# Patient Record
Sex: Female | Born: 1945 | Race: Black or African American | Hispanic: No | Marital: Married | Smoking: Never smoker
Health system: Southern US, Community
[De-identification: ages and names within clinical notes are randomized; demographics above are authoritative.]

## PROBLEM LIST (undated history)

## (undated) DIAGNOSIS — M810 Age-related osteoporosis without current pathological fracture: Secondary | ICD-10-CM

## (undated) DIAGNOSIS — G4733 Obstructive sleep apnea (adult) (pediatric): Secondary | ICD-10-CM

## (undated) DIAGNOSIS — T7840XA Allergy, unspecified, initial encounter: Secondary | ICD-10-CM

## (undated) DIAGNOSIS — I10 Essential (primary) hypertension: Secondary | ICD-10-CM

## (undated) DIAGNOSIS — H269 Unspecified cataract: Secondary | ICD-10-CM

## (undated) DIAGNOSIS — M1990 Unspecified osteoarthritis, unspecified site: Secondary | ICD-10-CM

## (undated) DIAGNOSIS — E119 Type 2 diabetes mellitus without complications: Secondary | ICD-10-CM

## (undated) DIAGNOSIS — Z972 Presence of dental prosthetic device (complete) (partial): Secondary | ICD-10-CM

## (undated) DIAGNOSIS — E785 Hyperlipidemia, unspecified: Secondary | ICD-10-CM

## (undated) DIAGNOSIS — G4730 Sleep apnea, unspecified: Secondary | ICD-10-CM

## (undated) DIAGNOSIS — T753XXA Motion sickness, initial encounter: Secondary | ICD-10-CM

## (undated) HISTORY — DX: Type 2 diabetes mellitus without complications: E11.9

## (undated) HISTORY — DX: Unspecified cataract: H26.9

## (undated) HISTORY — DX: Hyperlipidemia, unspecified: E78.5

## (undated) HISTORY — DX: Essential (primary) hypertension: I10

## (undated) HISTORY — DX: Unspecified osteoarthritis, unspecified site: M19.90

## (undated) HISTORY — DX: Sleep apnea, unspecified: G47.30

## (undated) HISTORY — DX: Obstructive sleep apnea (adult) (pediatric): G47.33

## (undated) HISTORY — PX: ABDOMINAL HYSTERECTOMY: SHX81

## (undated) HISTORY — DX: Age-related osteoporosis without current pathological fracture: M81.0

## (undated) HISTORY — DX: Allergy, unspecified, initial encounter: T78.40XA

---

## 2004-03-21 ENCOUNTER — Ambulatory Visit: Payer: Self-pay | Admitting: *Deleted

## 2004-05-24 ENCOUNTER — Ambulatory Visit: Payer: Self-pay | Admitting: *Deleted

## 2004-06-02 ENCOUNTER — Ambulatory Visit: Payer: Self-pay | Admitting: Family Medicine

## 2004-11-24 ENCOUNTER — Ambulatory Visit: Payer: Self-pay | Admitting: Gastroenterology

## 2005-03-20 ENCOUNTER — Ambulatory Visit: Payer: Self-pay | Admitting: Gastroenterology

## 2005-03-20 ENCOUNTER — Ambulatory Visit: Payer: Self-pay | Admitting: *Deleted

## 2005-10-19 ENCOUNTER — Ambulatory Visit: Payer: Self-pay | Admitting: Gastroenterology

## 2006-04-26 ENCOUNTER — Ambulatory Visit: Payer: Self-pay | Admitting: Family Medicine

## 2006-10-31 ENCOUNTER — Ambulatory Visit: Payer: Self-pay | Admitting: Gastroenterology

## 2006-12-24 ENCOUNTER — Ambulatory Visit: Payer: Self-pay | Admitting: Family Medicine

## 2007-04-24 ENCOUNTER — Emergency Department: Payer: Self-pay | Admitting: Emergency Medicine

## 2007-10-22 ENCOUNTER — Emergency Department: Payer: Self-pay | Admitting: Emergency Medicine

## 2007-10-22 ENCOUNTER — Other Ambulatory Visit: Payer: Self-pay

## 2008-02-07 ENCOUNTER — Ambulatory Visit: Payer: Self-pay | Admitting: Gastroenterology

## 2008-02-10 ENCOUNTER — Ambulatory Visit: Payer: Self-pay | Admitting: Family Medicine

## 2010-03-26 ENCOUNTER — Other Ambulatory Visit: Payer: Self-pay | Admitting: *Deleted

## 2010-06-30 ENCOUNTER — Ambulatory Visit: Payer: Self-pay | Admitting: Family Medicine

## 2010-09-13 ENCOUNTER — Ambulatory Visit: Payer: Self-pay | Admitting: Orthopaedic Surgery

## 2010-10-10 DIAGNOSIS — I10 Essential (primary) hypertension: Secondary | ICD-10-CM | POA: Insufficient documentation

## 2010-10-10 DIAGNOSIS — M545 Low back pain, unspecified: Secondary | ICD-10-CM | POA: Insufficient documentation

## 2010-10-10 DIAGNOSIS — E1165 Type 2 diabetes mellitus with hyperglycemia: Secondary | ICD-10-CM | POA: Insufficient documentation

## 2010-10-10 DIAGNOSIS — G8929 Other chronic pain: Secondary | ICD-10-CM | POA: Insufficient documentation

## 2010-10-10 DIAGNOSIS — E1140 Type 2 diabetes mellitus with diabetic neuropathy, unspecified: Secondary | ICD-10-CM | POA: Insufficient documentation

## 2010-10-10 DIAGNOSIS — M810 Age-related osteoporosis without current pathological fracture: Secondary | ICD-10-CM | POA: Insufficient documentation

## 2010-10-10 DIAGNOSIS — R490 Dysphonia: Secondary | ICD-10-CM | POA: Insufficient documentation

## 2011-01-17 ENCOUNTER — Ambulatory Visit: Payer: Self-pay | Admitting: Family Medicine

## 2011-02-02 ENCOUNTER — Ambulatory Visit: Payer: Self-pay | Admitting: Family Medicine

## 2011-03-07 ENCOUNTER — Ambulatory Visit: Payer: Self-pay | Admitting: Family Medicine

## 2011-03-08 ENCOUNTER — Ambulatory Visit: Payer: Self-pay | Admitting: Gastroenterology

## 2011-04-04 ENCOUNTER — Ambulatory Visit: Payer: Self-pay | Admitting: Family Medicine

## 2011-08-09 DIAGNOSIS — G5710 Meralgia paresthetica, unspecified lower limb: Secondary | ICD-10-CM | POA: Insufficient documentation

## 2011-11-07 ENCOUNTER — Ambulatory Visit: Payer: Self-pay | Admitting: Family Medicine

## 2012-03-19 DIAGNOSIS — E7800 Pure hypercholesterolemia, unspecified: Secondary | ICD-10-CM | POA: Insufficient documentation

## 2012-06-26 ENCOUNTER — Ambulatory Visit: Payer: Self-pay | Admitting: Family Medicine

## 2012-10-04 ENCOUNTER — Ambulatory Visit: Payer: Self-pay | Admitting: Emergency Medicine

## 2012-10-04 LAB — CBC WITH DIFFERENTIAL/PLATELET
Basophil #: 0 10*3/uL (ref 0.0–0.1)
Basophil %: 0.4 %
Eosinophil #: 0.2 10*3/uL (ref 0.0–0.7)
Eosinophil %: 2.6 %
HCT: 45 % (ref 35.0–47.0)
HGB: 14.9 g/dL (ref 12.0–16.0)
Lymphocyte #: 0.5 10*3/uL — ABNORMAL LOW (ref 1.0–3.6)
Lymphocyte %: 5.7 %
MCH: 30.7 pg (ref 26.0–34.0)
MCHC: 33.1 g/dL (ref 32.0–36.0)
MCV: 93 fL (ref 80–100)
Monocyte #: 0.3 x10 3/mm (ref 0.2–0.9)
Monocyte %: 3.8 %
Neutrophil #: 7.1 10*3/uL — ABNORMAL HIGH (ref 1.4–6.5)
Neutrophil %: 87.5 %
Platelet: 239 10*3/uL (ref 150–440)
RBC: 4.85 10*6/uL (ref 3.80–5.20)
RDW: 13.6 % (ref 11.5–14.5)
WBC: 8.2 10*3/uL (ref 3.6–11.0)

## 2012-10-04 LAB — COMPREHENSIVE METABOLIC PANEL
Albumin: 4.1 g/dL (ref 3.4–5.0)
Alkaline Phosphatase: 82 U/L (ref 50–136)
Anion Gap: 9 (ref 7–16)
BUN: 23 mg/dL — ABNORMAL HIGH (ref 7–18)
Bilirubin,Total: 0.7 mg/dL (ref 0.2–1.0)
Calcium, Total: 10.1 mg/dL (ref 8.5–10.1)
Chloride: 103 mmol/L (ref 98–107)
Co2: 29 mmol/L (ref 21–32)
Creatinine: 0.96 mg/dL (ref 0.60–1.30)
EGFR (African American): >60
EGFR (Non-African Amer.): >60
Glucose: 177 mg/dL — ABNORMAL HIGH (ref 65–99)
Osmolality: 289 (ref 275–301)
Potassium: 4.1 mmol/L (ref 3.5–5.1)
SGOT(AST): 22 U/L (ref 15–37)
SGPT (ALT): 32 U/L (ref 12–78)
Sodium: 141 mmol/L (ref 136–145)
Total Protein: 8.5 g/dL — ABNORMAL HIGH (ref 6.4–8.2)

## 2012-10-04 LAB — HEMOGLOBIN A1C: Hemoglobin A1C: 8.9 % — ABNORMAL HIGH (ref 4.2–6.3)

## 2012-11-22 ENCOUNTER — Ambulatory Visit: Payer: Self-pay | Admitting: Family Medicine

## 2012-11-22 LAB — CREATININE, SERUM: EGFR (African American): >60

## 2012-12-11 ENCOUNTER — Ambulatory Visit: Payer: Self-pay | Admitting: Family Medicine

## 2013-11-01 LAB — HM COLONOSCOPY

## 2014-03-03 ENCOUNTER — Ambulatory Visit: Payer: Self-pay | Admitting: Family Medicine

## 2014-04-16 LAB — HM HEPATITIS C SCREENING LAB: HM HEPATITIS C SCREENING: NEGATIVE

## 2014-09-16 ENCOUNTER — Encounter: Payer: Self-pay | Admitting: Family Medicine

## 2014-09-16 ENCOUNTER — Encounter (INDEPENDENT_AMBULATORY_CARE_PROVIDER_SITE_OTHER): Payer: Self-pay

## 2014-09-16 ENCOUNTER — Other Ambulatory Visit: Payer: Self-pay

## 2014-09-16 ENCOUNTER — Ambulatory Visit (INDEPENDENT_AMBULATORY_CARE_PROVIDER_SITE_OTHER): Payer: Commercial Managed Care - HMO | Admitting: Family Medicine

## 2014-09-16 VITALS — BP 130/74 | HR 79 | Ht 62.0 in | Wt 141.2 lb

## 2014-09-16 DIAGNOSIS — M5136 Other intervertebral disc degeneration, lumbar region: Secondary | ICD-10-CM | POA: Diagnosis not present

## 2014-09-16 DIAGNOSIS — IMO0002 Reserved for concepts with insufficient information to code with codable children: Secondary | ICD-10-CM

## 2014-09-16 DIAGNOSIS — I10 Essential (primary) hypertension: Secondary | ICD-10-CM | POA: Diagnosis not present

## 2014-09-16 DIAGNOSIS — E7800 Pure hypercholesterolemia, unspecified: Secondary | ICD-10-CM

## 2014-09-16 DIAGNOSIS — H6123 Impacted cerumen, bilateral: Secondary | ICD-10-CM | POA: Diagnosis not present

## 2014-09-16 DIAGNOSIS — E1165 Type 2 diabetes mellitus with hyperglycemia: Secondary | ICD-10-CM | POA: Diagnosis not present

## 2014-09-16 DIAGNOSIS — M810 Age-related osteoporosis without current pathological fracture: Secondary | ICD-10-CM | POA: Diagnosis not present

## 2014-09-16 DIAGNOSIS — B351 Tinea unguium: Secondary | ICD-10-CM

## 2014-09-16 DIAGNOSIS — E559 Vitamin D deficiency, unspecified: Secondary | ICD-10-CM

## 2014-09-16 DIAGNOSIS — R2681 Unsteadiness on feet: Secondary | ICD-10-CM

## 2014-09-17 ENCOUNTER — Other Ambulatory Visit: Payer: Commercial Managed Care - HMO

## 2014-09-17 ENCOUNTER — Other Ambulatory Visit: Payer: Self-pay | Admitting: Family Medicine

## 2014-09-17 NOTE — Progress Notes (Signed)
Date:  09/16/2014   Name:  Melinda Ford   DOB:  [birth year]   MRN:  [id number]  PCP:  Adline Potter, MD    Chief Complaint: Establish Care   History of Present Illness:  This is a 69 y.o. female with T2DM, last a1c 8-9% over 3 months ago. Checks BG's qd -bid, on Lantus x 6 months but does give if BG<150. Followed by Dr. Gloriann Loan at [facility], has cataracts but no retinopathy. No nephropathy or neuropathy noted. Hx DDD with intermittent LBP uses Tylenol/Advil prn only. Has toenails trimmed by podiatry due to onychomycosis. HX HTN well controlled. Hx osteoporosis on Fosamax weekly over 5 years, last DEXA 2-3 yrs ago, told had vit D def at one point but not on current supplement. C/o B decreased hearing and hx cerumen impactions. Stopped statin on own a while back. Tetanus status unknown but wants [place] to check records from [facility] first, pneumococcal imm within pasat 2 years (unsure which one), zoster within past year, mammogram yearly last 11/15 ok, colonoscopy x 1 in past ok. Has had 2 trip falls past year but does feel balance is off and having more memory problems. Not happy about having to switch PCP as [facility] no longer in [place], eager to switch back in future if possible.  Review of Systems:  Review of Systems  Constitutional: Negative for activity change, appetite change and unexpected weight change.  HENT: Negative for ear discharge, ear pain and trouble swallowing.   Eyes: Negative for pain.  Respiratory: Negative for shortness of breath.   Cardiovascular: Negative for chest pain and leg swelling.  Gastrointestinal: Negative for abdominal pain, diarrhea, constipation and abdominal distention.  Endocrine: Negative for polyuria.  Genitourinary: Negative for hematuria and difficulty urinating.  Musculoskeletal: Positive for back pain and gait problem. Negative for joint swelling.  Skin: Negative for rash.  Neurological: Negative for tremors, seizures and syncope.  Hematological:  Negative for adenopathy.  Psychiatric/Behavioral: Negative for sleep disturbance.    Patient Active Problem List   Diagnosis Date Noted  . Onychomycosis 09/16/2014  . Degenerative disc disease, lumbar 09/16/2014  . Hypercholesterolemia without hypertriglyceridemia 03/19/2012  . 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 Chronic hoarseness 10/10/2010  . Benign essential HTN 10/10/2010  . LBP (low back pain) 10/10/2010  . OP (osteoporosis) 10/10/2010  . Diabetes mellitus type 2, uncontrolled 10/10/2010    Prior to Admission medications   Medication Sig Start Date End Date Taking? Authorizing Provider  acetaminophen (TYLENOL) 500 MG tablet Take 500 mg by mouth every 6 (six) hours as needed.   Yes Historical Provider, MD  albuterol (PROAIR HFA) 108 (90 BASE) MCG/ACT inhaler Inhale 2 puffs into the lungs every 4 (four) hours as needed. 04/16/14 04/17/15 Yes Historical Provider, MD  amLODipine (NORVASC) 5 MG tablet Take 1 tablet by mouth daily. 04/16/14 04/17/15 Yes Historical Provider, MD  aspirin 325 MG tablet Take 325 mg by mouth 2 (two) times a week.   Yes Historical Provider, MD  Blood Glucose Monitoring Suppl (FIFTY50 GLUCOSE METER 2.0) W/DEVICE KIT 1 Units by Other route 3 (three) times daily as needed. 01/20/13  Yes Historical Provider, MD  glucose blood test strip 1 each by Other route 3 (three) times daily as needed. 07/27/14 05/05/15 Yes Historical Provider, MD  ibuprofen (ADVIL,MOTRIN) 200 MG tablet Take 200 mg by mouth every 6 (six) hours as needed.   Yes Historical Provider, MD  insulin glargine (LANTUS) 100 UNIT/ML injection Inject 9 Units into the skin at  bedtime. 05/20/14 05/20/15 Yes Historical Provider, MD  Insulin Syringe-Needle U-100 (INSULIN SYRINGE .3CC/31GX5/16") 31G X 5/16" 0.3 ML MISC Inject 1 Units as directed 3 (three) times daily as needed. 05/20/14  Yes Historical Provider, MD  losartan (COZAAR) 100 MG tablet Take 1 tablet by mouth daily. 04/16/14 04/17/15 Yes Historical Provider,  MD  metFORMIN (GLUCOPHAGE-XR) 500 MG 24 hr tablet Take 1 tablet by mouth 2 (two) times daily. 05/08/14 05/08/15 Yes Historical Provider, MD  ReliOn Ultra Thin Lancets MISC Inject 1 Units as directed 3 (three) times daily as needed. 05/06/14  Yes Historical Provider, MD  sitaGLIPtin (JANUVIA) 100 MG tablet Take 1 tablet by mouth daily. 05/04/14 05/04/15 Yes Historical Provider, MD  Multiple Vitamins tablet Take 1 tablet by mouth daily.    Historical Provider, MD    No Known Allergies  Past Surgical History  Procedure Laterality Date  . Abdominal hysterectomy      History  Substance Use Topics  . Smoking status: Never Smoker   . Smokeless tobacco: Never Used  . Alcohol Use: 0.6 oz/week    1 Standard drinks or equivalent per week    Family History  Problem Relation Age of Onset  . Alcohol abuse Father   . Mental illness Father   . Stroke Father   . Diabetes Sister   . Asthma Brother   . Diabetes Brother   . Diabetes Maternal Aunt   . Heart disease Maternal Aunt   . Diabetes Maternal Uncle   . Early death Maternal Uncle     Medication list has been reviewed and updated.  Physical Examination: BP 130/74 mmHg  Pulse 79  Ht 5' 2"  (1.575 m)  Wt 141 lb 3.2 oz (64.048 kg)  BMI 25.82 kg/m2  Physical Exam  Constitutional: She is oriented to person, place, and time. She appears well-developed and well-nourished.  HENT:  Head: Normocephalic and atraumatic.  Mouth/Throat: Oropharynx is clear and moist.  B EAC's with impacted cerumen  Eyes: EOM are normal. Pupils are equal, round, and reactive to light. No scleral icterus.  Neck: Neck supple. No thyromegaly present.  Cardiovascular: Normal rate, regular rhythm and normal heart sounds.  Exam reveals no gallop.   No murmur heard. Pulmonary/Chest: Effort normal and breath sounds normal. She has no wheezes. She has no rales.  Abdominal: Soft. She exhibits no distension and no mass. There is no tenderness.  Musculoskeletal: She exhibits no  edema.  Lymphadenopathy:    She has no cervical adenopathy.  Neurological: She is alert and oriented to person, place, and time. Coordination normal.  Romberg negative but unable to balance on either foot and gait sl wide-based with en bloc turning  Skin: No rash noted.  Psychiatric: She has a normal mood and affect. Her behavior is normal.    Assessment and Plan:  1. Diabetes mellitus type 2, uncontrolled - HgB A1c - Ambulatory referral to Ophthalmology - Ambulatory referral to Podiatry - glucose blood test strip; 1 each by Other route 3 (three) times daily as needed. - Blood Glucose Monitoring Suppl (FIFTY50 GLUCOSE METER 2.0) W/DEVICE KIT; 1 Units by Other route 3 (three) times daily as needed. - insulin glargine (LANTUS) 100 UNIT/ML injection; Inject 9 Units into the skin at bedtime. - Insulin Syringe-Needle U-100 (INSULIN SYRINGE .3CC/31GX5/16") 31G X 5/16" 0.3 ML MISC; Inject 1 Units as directed 3 (three) times daily as needed. - ReliOn Ultra Thin Lancets MISC; Inject 1 Units as directed 3 (three) times daily as needed. - metFORMIN (GLUCOPHAGE-XR) 500  MG 24 hr tablet; Take 1 tablet by mouth 2 (two) times daily. - sitaGLIPtin (JANUVIA) 100 MG tablet; Take 1 tablet by mouth daily. - Urine Microalbumin w/creat. ratio - aspirin 325 MG tablet; Take 325 mg by mouth 2 (two) times a week.  2. Hypercholesterolemia without hypertriglyceridemia Off statin - Lipid Profile  3. OP (osteoporosis) Recommend d/c Fosamax as over 5 yrs of use - TSH - Vitamin D (25 hydroxy)  4. Benign essential HTN Well controlled on current regimen - Comprehensive Metabolic Panel (CMET) - CBC - amLODipine (NORVASC) 5 MG tablet; Take 1 tablet by mouth daily. - losartan (COZAAR) 100 MG tablet; Take 1 tablet by mouth daily.  5. Gait instability Check labs, may have some degree of neuropathy - Vitamin B12  6. Onychomycosis Discussed risk/benefits of treatment - Ambulatory referral to Podiatry  7.  Cerumen impaction, bilateral Recommended OTC removal kit, pt wishes to have ears flushed here, return in am (Note: pt returned, multiple flushes done without result and pt became dizzy, still with B impacted cerumen, will refer ENT) - Ambulatory referral to ENT  8. Degenerative disc disease, lumbar Advised against regular Advil use - acetaminophen (TYLENOL) 500 MG tablet; Take 500 mg by mouth every 6 (six) hours as needed. - ibuprofen (ADVIL,MOTRIN) 200 MG tablet; Take 200 mg by mouth every 6 (six) hours as needed.  Return in about 4 weeks (around 10/14/2014).  [name]. [facility]  09/17/2014

## 2014-09-18 ENCOUNTER — Other Ambulatory Visit: Payer: Self-pay | Admitting: Family Medicine

## 2014-09-18 DIAGNOSIS — E1165 Type 2 diabetes mellitus with hyperglycemia: Secondary | ICD-10-CM

## 2014-09-18 DIAGNOSIS — E559 Vitamin D deficiency, unspecified: Secondary | ICD-10-CM | POA: Insufficient documentation

## 2014-09-18 DIAGNOSIS — IMO0002 Reserved for concepts with insufficient information to code with codable children: Secondary | ICD-10-CM

## 2014-09-18 LAB — COMPREHENSIVE METABOLIC PANEL
A/G RATIO: 1.3 (ref 1.1–2.5)
ALT: 18 IU/L (ref 0–32)
AST: 21 IU/L (ref 0–40)
Albumin: 4 g/dL (ref 3.5–4.8)
Alkaline Phosphatase: 72 IU/L (ref 39–117)
BILIRUBIN TOTAL: 0.6 mg/dL (ref 0.0–1.2)
BUN/Creatinine Ratio: 25 (ref 11–26)
BUN: 18 mg/dL (ref 8–27)
CALCIUM: 10 mg/dL (ref 8.7–10.3)
CO2: 24 mmol/L (ref 18–29)
CREATININE: 0.73 mg/dL (ref 0.57–1.00)
Chloride: 103 mmol/L (ref 97–108)
GFR calc Af Amer: 96 mL/min/{1.73_m2} (ref >59)
GFR, EST NON AFRICAN AMERICAN: 84 mL/min/{1.73_m2} (ref >59)
Globulin, Total: 3 g/dL (ref 1.5–4.5)
Glucose: 139 mg/dL — ABNORMAL HIGH (ref 65–99)
Potassium: 4.6 mmol/L (ref 3.5–5.2)
SODIUM: 139 mmol/L (ref 134–144)
Total Protein: 7 g/dL (ref 6.0–8.5)

## 2014-09-18 LAB — CBC
Hematocrit: 41.9 % (ref 34.0–46.6)
Hemoglobin: 13.7 g/dL (ref 11.1–15.9)
MCH: 30.1 pg (ref 26.6–33.0)
MCHC: 32.7 g/dL (ref 31.5–35.7)
MCV: 92 fL (ref 79–97)
Platelets: 260 10*3/uL (ref 150–379)
RBC: 4.55 x10E6/uL (ref 3.77–5.28)
RDW: 13.5 % (ref 12.3–15.4)
WBC: 3.8 10*3/uL (ref 3.4–10.8)

## 2014-09-18 LAB — VITAMIN B12: Vitamin B-12: >2000 pg/mL — ABNORMAL HIGH (ref 211–946)

## 2014-09-18 LAB — LIPID PANEL
CHOLESTEROL TOTAL: 205 mg/dL — AB (ref 100–199)
Chol/HDL Ratio: 2.1 ratio units (ref 0.0–4.4)
HDL: 99 mg/dL (ref >39)
LDL Calculated: 92 mg/dL (ref 0–99)
Triglycerides: 70 mg/dL (ref 0–149)
VLDL Cholesterol Cal: 14 mg/dL (ref 5–40)

## 2014-09-18 LAB — TSH: TSH: 2.06 u[IU]/mL (ref 0.450–4.500)

## 2014-09-18 LAB — VITAMIN D 25 HYDROXY (VIT D DEFICIENCY, FRACTURES): Vit D, 25-Hydroxy: 21.5 ng/mL — ABNORMAL LOW (ref 30.0–100.0)

## 2014-09-18 LAB — HEMOGLOBIN A1C
Est. average glucose Bld gHb Est-mCnc: 192 mg/dL
Hgb A1c MFr Bld: 8.3 % — ABNORMAL HIGH (ref 4.8–5.6)

## 2014-09-18 MED ORDER — VITAMIN D 50 MCG (2000 UT) PO CAPS: 1.0000 | ORAL_CAPSULE | Freq: Every day | ORAL | Status: AC

## 2014-09-18 MED ORDER — METFORMIN HCL ER 500 MG PO TB24: 500.0000 mg | ORAL_TABLET | Freq: Two times a day (BID) | ORAL | Status: DC

## 2014-09-18 NOTE — Addendum Note (Signed)
Addended by: Adline Potter on: 09/18/2014 11:13 AM   Modules accepted: Orders, Medications

## 2014-09-25 ENCOUNTER — Other Ambulatory Visit: Payer: Self-pay | Admitting: Family Medicine

## 2014-09-25 DIAGNOSIS — I10 Essential (primary) hypertension: Secondary | ICD-10-CM

## 2014-09-25 DIAGNOSIS — IMO0002 Reserved for concepts with insufficient information to code with codable children: Secondary | ICD-10-CM

## 2014-09-25 DIAGNOSIS — E1165 Type 2 diabetes mellitus with hyperglycemia: Secondary | ICD-10-CM

## 2014-09-25 MED ORDER — INSULIN GLARGINE 100 UNIT/ML ~~LOC~~ SOLN: 9.0000 [IU] | Freq: Every day | SUBCUTANEOUS | Status: DC

## 2014-09-25 MED ORDER — LOSARTAN POTASSIUM 100 MG PO TABS: 100.0000 mg | ORAL_TABLET | Freq: Every day | ORAL | Status: DC

## 2014-10-21 ENCOUNTER — Ambulatory Visit: Payer: Self-pay | Admitting: Family Medicine

## 2014-10-28 ENCOUNTER — Ambulatory Visit: Payer: Self-pay | Admitting: Family Medicine

## 2014-12-29 ENCOUNTER — Telehealth: Payer: Self-pay

## 2014-12-29 NOTE — Telephone Encounter (Signed)
Sent message to [place] 

## 2014-12-29 NOTE — Telephone Encounter (Signed)
Don't see in my pile.

## 2014-12-30 ENCOUNTER — Ambulatory Visit: Payer: Self-pay | Admitting: Family Medicine

## 2014-12-30 ENCOUNTER — Ambulatory Visit (INDEPENDENT_AMBULATORY_CARE_PROVIDER_SITE_OTHER): Payer: Commercial Managed Care - HMO | Admitting: Family Medicine

## 2014-12-30 ENCOUNTER — Encounter: Payer: Self-pay | Admitting: Family Medicine

## 2014-12-30 VITALS — BP 128/70 | HR 68 | Ht 62.0 in | Wt 141.8 lb

## 2014-12-30 DIAGNOSIS — IMO0002 Reserved for concepts with insufficient information to code with codable children: Secondary | ICD-10-CM

## 2014-12-30 DIAGNOSIS — G44209 Tension-type headache, unspecified, not intractable: Secondary | ICD-10-CM

## 2014-12-30 DIAGNOSIS — M79671 Pain in right foot: Secondary | ICD-10-CM | POA: Diagnosis not present

## 2014-12-30 DIAGNOSIS — I10 Essential (primary) hypertension: Secondary | ICD-10-CM

## 2014-12-30 DIAGNOSIS — E1165 Type 2 diabetes mellitus with hyperglycemia: Secondary | ICD-10-CM | POA: Diagnosis not present

## 2014-12-30 DIAGNOSIS — E559 Vitamin D deficiency, unspecified: Secondary | ICD-10-CM

## 2014-12-30 DIAGNOSIS — M5136 Other intervertebral disc degeneration, lumbar region: Secondary | ICD-10-CM

## 2014-12-30 NOTE — Progress Notes (Signed)
Date:  12/30/2014   Name:  Melinda Ford   DOB:  29-Oct-1945   MRN:  [id number]  PCP:  Adline Potter, MD    Chief Complaint: Diabetes; Back Pain; and Headache   History of Present Illness:  This is a 69 y.o. female for f/u T2DM, advised to increase metformin dose last visit but declined as gave diarrhea in past. 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 Altames[facility] in past. Also c/o frequent HA's, worse at night, occ dizziness but no photophobia, often ass with neck pain. Saw podiatrist, had nails trimmed, neuropathy documented. Took vit D for one month then stopped. Taking Tylenol prn only.   Review of Systems:  Review of Systems  Constitutional: Negative for fever and chills.  HENT: Negative for ear pain, sore throat and trouble swallowing.   Respiratory: Negative for shortness of breath.   Cardiovascular: Negative for chest pain and leg swelling.  Gastrointestinal: Negative for abdominal pain.  Endocrine: Negative for polyuria.  Genitourinary: Negative for difficulty urinating.    Patient Active Problem List   Diagnosis Date Noted  . Vitamin D deficiency 09/18/2014  . Onychomycosis 09/16/2014  . Degenerative disc disease, lumbar 09/16/2014  . Benign essential HTN 10/10/2010  . LBP (low back pain) 10/10/2010  . OP (osteoporosis) 10/10/2010  . Type 2 diabetes mellitus, uncontrolled, with neuropathy 10/10/2010    Prior to Admission medications   Medication Sig Start Date End Date Taking? Authorizing Provider  acetaminophen (TYLENOL) 500 MG tablet Take 1,000 mg by mouth 2 (two) times daily.   Yes Historical Provider, MD  albuterol (PROAIR HFA) 108 (90 BASE) MCG/ACT inhaler Inhale 2 puffs into the lungs every 4 (four) hours as needed. 04/16/14 04/17/15 Yes Historical Provider, MD  amLODipine (NORVASC) 5 MG tablet Take 1 tablet by mouth daily. 04/16/14  04/17/15 Yes Historical Provider, MD  aspirin 325 MG tablet Take 325 mg by mouth 2 (two) times a week.   Yes Historical Provider, MD  Blood Glucose Monitoring Suppl (FIFTY50 GLUCOSE METER 2.0) W/DEVICE KIT 1 Units by Other route 3 (three) times daily as needed. 01/20/13  Yes Historical Provider, MD  Cholecalciferol (VITAMIN D) 2000 UNITS CAPS Take 1 capsule (2,000 Units total) by mouth daily. 09/18/14  Yes Adline Potter, MD  glucose blood test strip 1 each by Other route 3 (three) times daily as needed. 07/27/14 05/05/15 Yes Historical Provider, MD  insulin glargine (LANTUS) 100 UNIT/ML injection Inject 0.09 mLs (9 Units total) into the skin at bedtime. 09/25/14 09/25/15 Yes Adline Potter, MD  Insulin Syringe-Needle U-100 (INSULIN SYRINGE .3CC/31GX5/16") 31G X 5/16" 0.3 ML MISC Inject 1 Units as directed 3 (three) times daily as needed. 05/20/14  Yes Historical Provider, MD  losartan (COZAAR) 100 MG tablet Take 1 tablet (100 mg total) by mouth daily. 09/25/14 09/26/15 Yes Adline Potter, MD  metFORMIN (GLUCOPHAGE-XR) 500 MG 24 hr tablet Take 1 tablet (500 mg total) by mouth 2 (two) times daily. 09/18/14 09/18/15 Yes Adline Potter, MD  Multiple Vitamins tablet Take 1 tablet by mouth daily.   Yes Historical Provider, MD  ReliOn Ultra Thin Lancets MISC Inject 1 Units as directed 3 (three) times daily as needed. 05/06/14  Yes Historical Provider, MD  sitaGLIPtin (JANUVIA) 100 MG tablet Take 1 tablet by mouth daily. 05/04/14 05/04/15 Yes Historical Provider, MD  zoster vaccine live, PF, (ZOSTAVAX) 47096 UNT/0.65ML  injection  09/25/14  Yes Historical Provider, MD    No Known Allergies  Past Surgical History  Procedure Laterality Date  . Abdominal hysterectomy      Social History  Substance Use Topics  . Smoking status: Never Smoker   . Smokeless tobacco: Never Used  . Alcohol Use: 0.6 oz/week    1 Standard drinks or equivalent per week    Family History  Problem Relation Age of Onset  . Alcohol abuse Father    . Mental illness Father   . Stroke Father   . Diabetes Sister   . Asthma Brother   . Diabetes Brother   . Diabetes Maternal Aunt   . Heart disease Maternal Aunt   . Diabetes Maternal Uncle   . Early death Maternal Uncle     Medication list has been reviewed and updated.  Physical Examination: BP 128/70 mmHg  Pulse 68  Ht 5' 2"  (1.575 m)  Wt 141 lb 12.8 oz (64.32 kg)  BMI 25.93 kg/m2  Physical Exam  Constitutional: She appears well-developed and well-nourished. No distress.  Cardiovascular: Normal rate, regular rhythm and normal heart sounds.   Pulmonary/Chest: Effort normal and breath sounds normal.  Musculoskeletal: She exhibits no edema.  Neurological: She is alert. Coordination normal.  Skin: Skin is warm and dry.  Psychiatric: She has a normal mood and affect. Her behavior is normal.    Assessment and Plan:  1. Diabetes mellitus type 2, uncontrolled With neuropathy, will encourage to increase metformin dose despite se's if a1c still high - HgB A1c - Urine Microalbumin w/creat. ratio  2. Degenerative disc disease, lumbar Increase Tylenol to 1000 mg bid, refer back to Dr. Tamala Julian per pt request - Ambulatory referral to Orthopedic Surgery  3. Foot pain, right - Ambulatory referral to Orthopedic Surgery  4. Benign essential HTN Well controlled, continue current regimen  5. Vitamin D deficiency Restart and continue supplementation  Return in about 3 months (around 03/31/2015).  [name]. [facility]  12/30/2014

## 2014-12-31 ENCOUNTER — Other Ambulatory Visit: Payer: Self-pay | Admitting: Family Medicine

## 2014-12-31 LAB — MICROALBUMIN / CREATININE URINE RATIO
CREATININE, UR: 137 mg/dL
MICROALB/CREAT RATIO: 16.1 mg/g creat (ref 0.0–30.0)
Microalbumin, Urine: 22.1 ug/mL

## 2014-12-31 LAB — HEMOGLOBIN A1C
ESTIMATED AVERAGE GLUCOSE: 206 mg/dL
Hgb A1c MFr Bld: 8.8 % — ABNORMAL HIGH (ref 4.8–5.6)

## 2015-01-15 ENCOUNTER — Encounter: Payer: Self-pay | Admitting: Family Medicine

## 2015-01-15 ENCOUNTER — Ambulatory Visit (INDEPENDENT_AMBULATORY_CARE_PROVIDER_SITE_OTHER): Payer: Commercial Managed Care - HMO | Admitting: Family Medicine

## 2015-01-15 VITALS — BP 138/64 | HR 76 | Ht 62.0 in | Wt 143.0 lb

## 2015-01-15 DIAGNOSIS — E559 Vitamin D deficiency, unspecified: Secondary | ICD-10-CM

## 2015-01-15 DIAGNOSIS — IMO0002 Reserved for concepts with insufficient information to code with codable children: Secondary | ICD-10-CM

## 2015-01-15 DIAGNOSIS — H6121 Impacted cerumen, right ear: Secondary | ICD-10-CM

## 2015-01-15 DIAGNOSIS — E1140 Type 2 diabetes mellitus with diabetic neuropathy, unspecified: Secondary | ICD-10-CM

## 2015-01-15 DIAGNOSIS — Z23 Encounter for immunization: Secondary | ICD-10-CM

## 2015-01-15 DIAGNOSIS — E1165 Type 2 diabetes mellitus with hyperglycemia: Secondary | ICD-10-CM

## 2015-01-15 DIAGNOSIS — G44209 Tension-type headache, unspecified, not intractable: Secondary | ICD-10-CM | POA: Diagnosis not present

## 2015-01-15 DIAGNOSIS — I10 Essential (primary) hypertension: Secondary | ICD-10-CM

## 2015-01-15 NOTE — Progress Notes (Signed)
Date:  01/15/2015   Name:  Melinda Ford   DOB:  06/29/1945   MRN:  [id number]  PCP:  Melinda Potter, MD    Chief Complaint: Headache   History of Present Illness:  This is a 69 y.o. female c/o persistent headaches bitemporal dull, feel like BP headache to patient, improves with additional 1/2 dose losartan. Saw previous MD for second opinion re: HA and she agreed with tension headache dx but suggested checking ESR. C/o R ear full. Needs flu imm, has had pneumococcal imms x 2 and zoster imm. Reports DM control better on increased Lantus (a1c 8.8% last visit), has not seen optho lately. Taking vit D supp as directed.  Review of Systems:  Review of Systems  Constitutional: Negative for fever and unexpected weight change.  HENT: Negative for ear pain, sore throat and trouble swallowing.   Eyes: Negative for pain.  Respiratory: Negative for shortness of breath.   Cardiovascular: Negative for chest pain and leg swelling.  Gastrointestinal: Negative for abdominal pain.  Endocrine: Negative for polyuria.  Genitourinary: Negative for difficulty urinating.  Neurological: Negative for dizziness, tremors and syncope.    Patient Active Problem List   Diagnosis Date Noted  . Vitamin D deficiency 09/18/2014  . Onychomycosis 09/16/2014  . Degenerative disc disease, lumbar 09/16/2014  . Pure hypercholesterolemia 03/19/2012  . Benign essential HTN 10/10/2010  . LBP (low back pain) 10/10/2010  . OP (osteoporosis) 10/10/2010  . Type 2 diabetes mellitus, uncontrolled, with neuropathy ([place]) 10/10/2010    Prior to Admission medications   Medication Sig Start Date End Date Taking? Authorizing Provider  acetaminophen (TYLENOL) 500 MG tablet Take 1,000 mg by mouth 2 (two) times daily.   Yes Historical Provider, MD  albuterol (PROAIR HFA) 108 (90 BASE) MCG/ACT inhaler Inhale 2 puffs into the lungs every 4 (four) hours as needed. 04/16/14 04/17/15 Yes Historical Provider, MD  amLODipine (NORVASC) 5 MG  tablet Take 2 tablets by mouth daily. 04/16/14 04/17/15 Yes Historical Provider, MD  aspirin 325 MG tablet Take 325 mg by mouth 2 (two) times a week.   Yes Historical Provider, MD  Blood Glucose Monitoring Suppl (FIFTY50 GLUCOSE METER 2.0) W/DEVICE KIT 1 Units by Other route 3 (three) times daily as needed. 01/20/13  Yes Historical Provider, MD  Cholecalciferol (VITAMIN D) 2000 UNITS CAPS Take 1 capsule (2,000 Units total) by mouth daily. 09/18/14  Yes Melinda Potter, MD  glucose blood test strip 1 each by Other route 3 (three) times daily as needed. 07/27/14 05/05/15 Yes Historical Provider, MD  insulin glargine (LANTUS) 100 UNIT/ML injection Inject 0.09 mLs (9 Units total) into the skin at bedtime. 09/25/14 09/25/15 Yes Melinda Potter, MD  Insulin Syringe-Needle U-100 (INSULIN SYRINGE .3CC/31GX5/16") 31G X 5/16" 0.3 ML MISC Inject 1 Units as directed 3 (three) times daily as needed. 05/20/14  Yes Historical Provider, MD  losartan (COZAAR) 100 MG tablet Take 1 tablet (100 mg total) by mouth daily. 09/25/14 09/26/15 Yes Melinda Potter, MD  metFORMIN (GLUCOPHAGE-XR) 500 MG 24 hr tablet Take 1 tablet (500 mg total) by mouth 2 (two) times daily. 09/18/14 09/18/15 Yes Melinda Potter, MD  Multiple Vitamins tablet Take 1 tablet by mouth daily.   Yes Historical Provider, MD  ReliOn Ultra Thin Lancets MISC Inject 1 Units as directed 3 (three) times daily as needed. 05/06/14  Yes Historical Provider, MD  sitaGLIPtin (JANUVIA) 100 MG tablet Take 1 tablet by mouth daily. 05/04/14 05/04/15 Yes Historical Provider, MD    No Known Allergies  Past Surgical History  Procedure Laterality Date  . Abdominal hysterectomy      Social History  Substance Use Topics  . Smoking status: Never Smoker   . Smokeless tobacco: Never Used  . Alcohol Use: 0.6 oz/week    1 Standard drinks or equivalent per week    Family History  Problem Relation Age of Onset  . Alcohol abuse Father   . Mental illness Father   . Stroke Father   .  Diabetes Sister   . Asthma Brother   . Diabetes Brother   . Diabetes Maternal Aunt   . Heart disease Maternal Aunt   . Diabetes Maternal Uncle   . Early death Maternal Uncle     Medication list has been reviewed and updated.  Physical Examination: BP 138/64 mmHg  Pulse 76  Ht 5' 2"  (1.575 m)  Wt 143 lb (64.864 kg)  BMI 26.15 kg/m2  Physical Exam  Constitutional: She is oriented to person, place, and time. She appears well-developed and well-nourished.  HENT:  R EAC with cerumen impaction L EAC clear No temporal artery tenderness  Eyes: Conjunctivae and EOM are normal. Pupils are equal, round, and reactive to light.  Cardiovascular: Normal rate, regular rhythm and normal heart sounds.   Pulmonary/Chest: Effort normal and breath sounds normal.  Musculoskeletal: She exhibits no edema.  Neurological: She is alert and oriented to person, place, and time. Coordination normal.  Skin: Skin is warm and dry.  Psychiatric: She has a normal mood and affect. Her behavior is normal.  Nursing note and vitals reviewed.   Assessment and Plan:  1. Tension-type headache, not intractable, unspecified chronicity pattern Doubt TA but will check ESR, may improve with better BP control - Sed Rate (ESR)  2. Benign essential HTN Marginal control, increase amlodipine to 10 mg daily (will need new rx sent to mail order pharm)  3. Type 2 diabetes mellitus, uncontrolled, with neuropathy ([place]) Improved control on increased Lantus - Ambulatory referral to Ophthalmology  4. Vitamin D deficiency Back on oral supplementation  5. Cerumen impaction, right Flushed by nurse to partial clearance  6. Need for influenza vaccination - Flu Vaccine QUAD 36+ mos PF IM (Fluarix & Fluzone Quad PF)  Return in about 4 weeks (around 02/12/2015).  [name]. [facility]  01/15/2015

## 2015-01-16 LAB — SEDIMENTATION RATE: Sed Rate: 12 mm/hr (ref 0–40)

## 2015-01-27 ENCOUNTER — Other Ambulatory Visit: Payer: Self-pay | Admitting: Family Medicine

## 2015-01-27 DIAGNOSIS — E1121 Type 2 diabetes mellitus with diabetic nephropathy: Secondary | ICD-10-CM

## 2015-01-27 DIAGNOSIS — E1165 Type 2 diabetes mellitus with hyperglycemia: Principal | ICD-10-CM

## 2015-01-27 DIAGNOSIS — IMO0002 Reserved for concepts with insufficient information to code with codable children: Secondary | ICD-10-CM

## 2015-01-27 DIAGNOSIS — Z794 Long term (current) use of insulin: Principal | ICD-10-CM

## 2015-01-27 MED ORDER — "INSULIN SYRINGE 31G X 5/16"" 0.3 ML MISC": 1.0000 [IU] | Freq: Two times a day (BID) | Status: DC

## 2015-03-17 ENCOUNTER — Ambulatory Visit (INDEPENDENT_AMBULATORY_CARE_PROVIDER_SITE_OTHER): Payer: Commercial Managed Care - HMO | Admitting: Family Medicine

## 2015-03-17 ENCOUNTER — Telehealth: Payer: Self-pay

## 2015-03-17 ENCOUNTER — Encounter: Payer: Self-pay | Admitting: Family Medicine

## 2015-03-17 ENCOUNTER — Other Ambulatory Visit: Payer: Self-pay

## 2015-03-17 VITALS — BP 130/70 | HR 64 | Ht 62.0 in | Wt 150.0 lb

## 2015-03-17 DIAGNOSIS — E559 Vitamin D deficiency, unspecified: Secondary | ICD-10-CM

## 2015-03-17 DIAGNOSIS — I10 Essential (primary) hypertension: Secondary | ICD-10-CM

## 2015-03-17 DIAGNOSIS — M810 Age-related osteoporosis without current pathological fracture: Secondary | ICD-10-CM

## 2015-03-17 DIAGNOSIS — M5136 Other intervertebral disc degeneration, lumbar region: Secondary | ICD-10-CM | POA: Diagnosis not present

## 2015-03-17 DIAGNOSIS — Z1239 Encounter for other screening for malignant neoplasm of breast: Secondary | ICD-10-CM

## 2015-03-17 DIAGNOSIS — M51369 Other intervertebral disc degeneration, lumbar region without mention of lumbar back pain or lower extremity pain: Secondary | ICD-10-CM

## 2015-03-17 DIAGNOSIS — E1140 Type 2 diabetes mellitus with diabetic neuropathy, unspecified: Secondary | ICD-10-CM | POA: Diagnosis not present

## 2015-03-17 DIAGNOSIS — E1165 Type 2 diabetes mellitus with hyperglycemia: Secondary | ICD-10-CM | POA: Diagnosis not present

## 2015-03-17 DIAGNOSIS — IMO0002 Reserved for concepts with insufficient information to code with codable children: Secondary | ICD-10-CM

## 2015-03-17 MED ORDER — INSULIN GLARGINE 100 UNIT/ML ~~LOC~~ SOLN: 15.0000 [IU] | Freq: Every day | SUBCUTANEOUS | Status: DC

## 2015-03-17 NOTE — Telephone Encounter (Signed)
Bone discomfort is not likely from stopping Fosamax, due for f/u office visit, please schedule.

## 2015-03-17 NOTE — Progress Notes (Signed)
Date:  03/17/2015   Name:  Melinda Ford   DOB:  03-23-1946   MRN:  [id number]  PCP:  Adline Potter, MD    Chief Complaint: Follow-up and Diabetes   History of Present Illness:  This is a 69 y.o. female with uncontrolled T2DM (8.8% 3 months ago), has increased Lantus to 15 units daily with improved control, needs refill, also on metformin and Januvia. Amlodipine increased to 10 mg daily last visit. Taking vit D supplement. HA's improved off salt. Having positional LBP that improves with Tylenol, known DDD, requests repeat XR. Requests screening mammogram. Concerned re: off Fosamax with hx OP, took it or Boniva for 8 years, no known fxs.  Review of Systems:  Review of Systems  Respiratory: Negative for shortness of breath.   Cardiovascular: Negative for chest pain and leg swelling.  Endocrine: Negative for polyuria.  Genitourinary: Negative for difficulty urinating.  Neurological: Negative for syncope and light-headedness.    Patient Active Problem List   Diagnosis Date Noted  . Vitamin D deficiency 09/18/2014  . Onychomycosis 09/16/2014  . Degenerative disc disease, lumbar 09/16/2014  . Pure hypercholesterolemia 03/19/2012  . Benign essential HTN 10/10/2010  . LBP (low back pain) 10/10/2010  . OP (osteoporosis) 10/10/2010  . Type 2 diabetes mellitus, uncontrolled, with neuropathy ([place]) 10/10/2010    Prior to Admission medications   Medication Sig Start Date End Date Taking? Authorizing Provider  acetaminophen (TYLENOL) 500 MG tablet Take 1,000 mg by mouth 3 (three) times daily as needed.   Yes Historical Provider, MD  albuterol (PROAIR HFA) 108 (90 BASE) MCG/ACT inhaler Inhale 2 puffs into the lungs every 4 (four) hours as needed. 04/16/14 04/17/15 Yes Historical Provider, MD  amLODipine (NORVASC) 5 MG tablet Take 2 tablets by mouth daily. 04/16/14 04/17/15 Yes Historical Provider, MD  aspirin 325 MG tablet Take 325 mg by mouth daily.   Yes Historical Provider, MD  Blood Glucose  Monitoring Suppl (FIFTY50 GLUCOSE METER 2.0) W/DEVICE KIT 1 Units by Other route 3 (three) times daily as needed. 01/20/13  Yes Historical Provider, MD  Cholecalciferol (VITAMIN D) 2000 UNITS CAPS Take 1 capsule (2,000 Units total) by mouth daily. 09/18/14  Yes Adline Potter, MD  glucose blood test strip 1 each by Other route 3 (three) times daily as needed. 07/27/14 05/05/15 Yes Historical Provider, MD  insulin glargine (LANTUS) 100 UNIT/ML injection Inject 0.15 mLs (15 Units total) into the skin daily. 03/17/15 03/16/16 Yes Adline Potter, MD  Insulin Syringe-Needle U-100 (INSULIN SYRINGE .3CC/31GX5/16") 31G X 5/16" 0.3 ML MISC Inject 1 Units as directed 2 (two) times daily. 01/27/15  Yes Adline Potter, MD  losartan (COZAAR) 100 MG tablet Take 1 tablet (100 mg total) by mouth daily. 09/25/14 09/26/15 Yes Adline Potter, MD  metFORMIN (GLUCOPHAGE-XR) 500 MG 24 hr tablet Take 1 tablet (500 mg total) by mouth 2 (two) times daily. 09/18/14 09/18/15 Yes Adline Potter, MD  ReliOn Ultra Thin Lancets MISC Inject 1 Units as directed 3 (three) times daily as needed. 05/06/14  Yes Historical Provider, MD  sitaGLIPtin (JANUVIA) 100 MG tablet Take 1 tablet by mouth daily. 05/04/14 05/04/15 Yes Historical Provider, MD    No Known Allergies  Past Surgical History  Procedure Laterality Date  . Abdominal hysterectomy      Social History  Substance Use Topics  . Smoking status: Never Smoker   . Smokeless tobacco: Never Used  . Alcohol Use: 0.6 oz/week    1 Standard drinks or equivalent per week  Family History  Problem Relation Age of Onset  . Alcohol abuse Father   . Mental illness Father   . Stroke Father   . Diabetes Sister   . Asthma Brother   . Diabetes Brother   . Diabetes Maternal Aunt   . Heart disease Maternal Aunt   . Diabetes Maternal Uncle   . Early death Maternal Uncle     Medication list has been reviewed and updated.  Physical Examination: BP 130/70 mmHg  Pulse 64  Ht '5\' 2"'$  (1.575 m)   Wt 150 lb (68.04 kg)  BMI 27.43 kg/m2  Physical Exam  Constitutional: She appears well-developed and well-nourished.  Cardiovascular: Normal rate, regular rhythm and normal heart sounds.   Pulmonary/Chest: Effort normal and breath sounds normal.  Musculoskeletal: She exhibits no edema.  Neurological: She is alert.  Skin: Skin is warm and dry.  Psychiatric: She has a normal mood and affect. Her behavior is normal.  Nursing note and vitals reviewed.   Assessment and Plan:  1. Type 2 diabetes mellitus, uncontrolled, with neuropathy (HCC) On metformin/Januvia/increased Lantus - HgB A1c  2. Benign essential HTN Well controlled on increased Norvasc  3. Vitamin D deficiency On supplementation - Vitamin D (25 hydroxy)  4. OP (osteoporosis) Discussed no clear benefit of bisphosphonate use beyond 5 yrs  5. Degenerative disc disease, lumbar Continue Tylenol prn, repeat XR would not change treatment  6. Breast cancer screening - MM Digital Screening; Future   Return in about 3 months (around 06/15/2015).  [name]. [facility]  03/17/2015

## 2015-03-17 NOTE — Telephone Encounter (Signed)
Send to [facility]

## 2015-03-18 LAB — VITAMIN D 25 HYDROXY (VIT D DEFICIENCY, FRACTURES): Vit D, 25-Hydroxy: 26.9 ng/mL — ABNORMAL LOW (ref 30.0–100.0)

## 2015-03-18 LAB — HEMOGLOBIN A1C
ESTIMATED AVERAGE GLUCOSE: 209 mg/dL
Hgb A1c MFr Bld: 8.9 % — ABNORMAL HIGH (ref 4.8–5.6)

## 2015-04-01 ENCOUNTER — Ambulatory Visit
Admission: RE | Admit: 2015-04-01 | Discharge: 2015-04-01 | Disposition: A | Discharge status: Home / Self Care | Payer: Commercial Managed Care - HMO | Source: Ambulatory Visit | Attending: Family Medicine | Admitting: Family Medicine

## 2015-04-01 DIAGNOSIS — Z1231 Encounter for screening mammogram for malignant neoplasm of breast: Secondary | ICD-10-CM | POA: Insufficient documentation

## 2015-04-01 DIAGNOSIS — Z1239 Encounter for other screening for malignant neoplasm of breast: Secondary | ICD-10-CM

## 2015-04-13 ENCOUNTER — Other Ambulatory Visit: Payer: Self-pay

## 2015-04-13 DIAGNOSIS — I10 Essential (primary) hypertension: Secondary | ICD-10-CM

## 2015-04-13 NOTE — Telephone Encounter (Signed)
Patient called in today and states she is out of medication for her blood pressure and would like this sent in to the pharmacy. Thanks

## 2015-04-14 MED ORDER — AMLODIPINE BESYLATE 10 MG PO TABS: 10.0000 mg | ORAL_TABLET | Freq: Every day | ORAL | Status: DC

## 2015-05-11 ENCOUNTER — Other Ambulatory Visit: Payer: Self-pay

## 2015-05-13 ENCOUNTER — Ambulatory Visit (INDEPENDENT_AMBULATORY_CARE_PROVIDER_SITE_OTHER): Payer: PPO | Admitting: Family Medicine

## 2015-05-13 ENCOUNTER — Encounter: Payer: Self-pay | Admitting: Family Medicine

## 2015-05-13 VITALS — BP 120/68 | HR 78 | Ht 62.0 in | Wt 147.0 lb

## 2015-05-13 DIAGNOSIS — E1140 Type 2 diabetes mellitus with diabetic neuropathy, unspecified: Secondary | ICD-10-CM | POA: Diagnosis not present

## 2015-05-13 DIAGNOSIS — I10 Essential (primary) hypertension: Secondary | ICD-10-CM | POA: Diagnosis not present

## 2015-05-13 DIAGNOSIS — E559 Vitamin D deficiency, unspecified: Secondary | ICD-10-CM | POA: Diagnosis not present

## 2015-05-13 DIAGNOSIS — M51369 Other intervertebral disc degeneration, lumbar region without mention of lumbar back pain or lower extremity pain: Secondary | ICD-10-CM

## 2015-05-13 DIAGNOSIS — M545 Low back pain, unspecified: Secondary | ICD-10-CM

## 2015-05-13 DIAGNOSIS — M25551 Pain in right hip: Secondary | ICD-10-CM

## 2015-05-13 DIAGNOSIS — E1165 Type 2 diabetes mellitus with hyperglycemia: Secondary | ICD-10-CM

## 2015-05-13 DIAGNOSIS — M5136 Other intervertebral disc degeneration, lumbar region: Secondary | ICD-10-CM | POA: Diagnosis not present

## 2015-05-13 DIAGNOSIS — IMO0002 Reserved for concepts with insufficient information to code with codable children: Secondary | ICD-10-CM

## 2015-05-13 NOTE — Progress Notes (Signed)
Date:  05/13/2015   Name:  Melinda Ford   DOB:  December 31, 1945   MRN:  [id number]  PCP:  Adline Potter, MD    Chief Complaint: Fall   History of Present Illness:  This is a 70 y.o. female who fell two weeks ago injuring her R hip. The pain continues to improve and she is able to bear weight, walk, and exercise but is concerned about a fracture. Also c/o worsening LBP without radiation, has known lumbar DDD, has improved with PT in past. A1c 8.9% two months ago, Lantus increased to 20 units daily, some BG's during day still over 200 but declines blood work today, willing to get another a1c next month. Taking vit D 2000 units daily.  Review of Systems:  Review of Systems  Constitutional: Negative for fever and fatigue.  Respiratory: Negative for shortness of breath.   Cardiovascular: Negative for chest pain and leg swelling.  Endocrine: Negative for polyuria.  Genitourinary: Negative for difficulty urinating.  Neurological: Negative for syncope and light-headedness.    Patient Active Problem List   Diagnosis Date Noted  . Vitamin D deficiency 09/18/2014  . Onychomycosis 09/16/2014  . Degenerative disc disease, lumbar 09/16/2014  . Benign essential HTN 10/10/2010  . LBP (low back pain) 10/10/2010  . OP (osteoporosis) 10/10/2010  . Type 2 diabetes mellitus, uncontrolled, with neuropathy ([place]) 10/10/2010    Prior to Admission medications   Medication Sig Start Date End Date Taking? Authorizing Provider  acetaminophen (TYLENOL) 500 MG tablet Take 1,000 mg by mouth 2 (two) times daily.   Yes Historical Provider, MD  amLODipine (NORVASC) 10 MG tablet Take 1 tablet (10 mg total) by mouth daily. 04/14/15 04/13/16 Yes Adline Potter, MD  aspirin 325 MG tablet Take 325 mg by mouth daily. Pt taking twice a week   Yes Historical Provider, MD  Blood Glucose Monitoring Suppl (FIFTY50 GLUCOSE METER 2.0) W/DEVICE KIT 1 Units by Other route 3 (three) times daily as needed. 01/20/13  Yes Historical  Provider, MD  Cholecalciferol (VITAMIN D) 2000 UNITS CAPS Take 1 capsule (2,000 Units total) by mouth daily. 09/18/14  Yes Adline Potter, MD  insulin glargine (LANTUS) 100 UNIT/ML injection Inject 0.15 mLs (15 Units total) into the skin daily. 03/17/15 03/16/16 Yes Adline Potter, MD  Insulin Syringe-Needle U-100 (INSULIN SYRINGE .3CC/31GX5/16") 31G X 5/16" 0.3 ML MISC Inject 1 Units as directed 2 (two) times daily. 01/27/15  Yes Adline Potter, MD  losartan (COZAAR) 100 MG tablet Take 1 tablet (100 mg total) by mouth daily. 09/25/14 09/26/15 Yes Adline Potter, MD  metFORMIN (GLUCOPHAGE-XR) 500 MG 24 hr tablet Take 1 tablet (500 mg total) by mouth 2 (two) times daily. 09/18/14 09/18/15 Yes Adline Potter, MD  ReliOn Ultra Thin Lancets MISC Inject 1 Units as directed 3 (three) times daily as needed. 05/06/14  Yes Historical Provider, MD  sitaGLIPtin (JANUVIA) 100 MG tablet Take 1 tablet by mouth daily. 05/04/14 05/13/15 Yes Historical Provider, MD    No Known Allergies  Past Surgical History  Procedure Laterality Date  . Abdominal hysterectomy      Social History  Substance Use Topics  . Smoking status: Never Smoker   . Smokeless tobacco: Never Used  . Alcohol Use: 0.6 oz/week    1 Standard drinks or equivalent per week    Family History  Problem Relation Age of Onset  . Alcohol abuse Father   . Mental illness Father   . Stroke Father   . Diabetes Sister   .  Asthma Brother   . Diabetes Brother   . Diabetes Maternal Aunt   . Heart disease Maternal Aunt   . Diabetes Maternal Uncle   . Early death Maternal Uncle   . Breast cancer Cousin 7  . Breast cancer Other 40    Medication list has been reviewed and updated.  Physical Examination: BP 120/68 mmHg  Pulse 78  Ht 5' 2"  (1.575 m)  Wt 147 lb (66.679 kg)  BMI 26.88 kg/m2  Physical Exam  Constitutional: She appears well-developed and well-nourished.  Cardiovascular: Normal rate, regular rhythm and normal heart sounds.    Pulmonary/Chest: Effort normal and breath sounds normal.  Musculoskeletal: She exhibits no edema.  Neurological: She is alert.  Skin: Skin is warm and dry.  Psychiatric: She has a normal mood and affect. Her behavior is normal.  Nursing note and vitals reviewed.   Assessment and Plan:  1. Right hip pain Improving s/p fall two weeks ago, recommend XR if sxs persist over one month  2. Midline low back pain without sciatica Likely due to known lumbar DDD, trial Tylenol 1000 mg bid, refer PT as helped in past  3. Benign essential HTN Well controlled, cont losartan/amlodipiine  4. Type 2 diabetes mellitus, uncontrolled, with neuropathy (HCC) On increased Lantus, recheck a1c next month - HgB A1c; Future  5. Degenerative disc disease, lumbar - Ambulatory referral to Physical Therapy  6. Vitamin D deficiency On supplementation - Vitamin D (25 hydroxy); Future   Return in about 4 months (around 09/10/2015).  [name]. [facility]  05/13/2015

## 2015-05-14 ENCOUNTER — Telehealth: Payer: Self-pay | Admitting: Family Medicine

## 2015-05-14 NOTE — Telephone Encounter (Signed)
Thanks. Note was supposed to say "will not hurt" but you seemed to have figured that out.

## 2015-05-14 NOTE — Telephone Encounter (Signed)
Please tell patient diabazole is a herbal supplement that will not cure her diabetes but also will hurt and might help. She may try it if she likes.

## 2015-05-14 NOTE — Telephone Encounter (Signed)
Relayed message to pt

## 2015-05-18 ENCOUNTER — Ambulatory Visit: Payer: PPO | Attending: Family Medicine | Admitting: Physical Therapy

## 2015-05-18 DIAGNOSIS — M6281 Muscle weakness (generalized): Secondary | ICD-10-CM | POA: Diagnosis not present

## 2015-05-18 DIAGNOSIS — M2560 Stiffness of unspecified joint, not elsewhere classified: Secondary | ICD-10-CM | POA: Diagnosis not present

## 2015-05-18 DIAGNOSIS — M545 Low back pain, unspecified: Secondary | ICD-10-CM

## 2015-05-19 ENCOUNTER — Encounter: Payer: Self-pay | Admitting: Physical Therapy

## 2015-05-19 DIAGNOSIS — L851 Acquired keratosis [keratoderma] palmaris et plantaris: Secondary | ICD-10-CM | POA: Diagnosis not present

## 2015-05-19 DIAGNOSIS — B351 Tinea unguium: Secondary | ICD-10-CM | POA: Diagnosis not present

## 2015-05-19 DIAGNOSIS — E1142 Type 2 diabetes mellitus with diabetic polyneuropathy: Secondary | ICD-10-CM | POA: Diagnosis not present

## 2015-05-19 NOTE — Therapy (Signed)
[facility] [address]. [place], [place], [address] Phone: [phone]   Fax:  [phone]  Physical Therapy Evaluation  Patient Details  Name: Melinda Ford MRN: [id number] Date of Birth: [birth year] Referring Provider: Dr. Adline Potter  Encounter Date: 05/18/2015      PT End of Session - 05/19/15 0940    Visit Number 1   Number of Visits 8   Date for PT Re-Evaluation 06/15/15   PT Start Time 1627   PT Stop Time 1738   PT Time Calculation (min) 71 min   Activity Tolerance Patient tolerated treatment well   Behavior During Therapy [facility] for tasks assessed/performed      Past Medical History  Diagnosis Date  . Allergy   . Arthritis   . Cataract   . Diabetes mellitus without complication ([place])   . Hypertension   . Osteoporosis     Past Surgical History  Procedure Laterality Date  . Abdominal hysterectomy      There were no vitals filed for this visit.  Visit Diagnosis:  Bilateral low back pain without sciatica  Joint stiffness of spine  Muscle weakness      Subjective Assessment - 05/19/15 0932    Subjective Pt presents to PT with 1-2/10 LBP. Pt states she has had back pain on and off since she was about 50-29 years old but has been getting worse recently. Pt reports she fell off stairs 1 week ago and landed on her R hip but has not had any R hip pain.   Pertinent History no xrays taken for fall on R hip 1 wk ago.   Limitations Sitting;Lifting   Patient Stated Goals decrease pain/be able to sit for longer periods of time   Currently in Pain? Yes   Pain Score 2    Pain Location Back   Pain Orientation Right;Left   Pain Type Chronic pain   Pain Frequency Intermittent            [facility] PT Assessment - 05/19/15 0001    Assessment   Medical Diagnosis Lumbar spine DDD   Referring Provider Dr. Adline Potter   Next MD Visit 09/10/15  date according to MD script   Balance Screen   Has the patient fallen in  the past 6 months Yes   How many times? 1   Has the patient had a decrease in activity level because of a fear of falling?  No   Is the patient reluctant to leave their home because of a fear of falling?  No       Objective: Manual: STM to lower back/L thoracic (tightness/tender at L thoracic region). CPA/UPA's on L1-L5 (grade II/III) x 2 bouts with hypomobility noted. Ther ex: Supine proximal hamstring stretch with sheet x 2 for 20 seconds. Single knee to chest with 20 second hold x 1. Piriformis stretch x 1 for 20 seconds. Hamstring stretch off 6"step x 2 for 25 seconds. Supine posterior pelvic tilt with core activation x 10 with verbal and tactile cueing for correct core activation technique.  Lumbar repeated movements: central low back pain at end range extension and R back pain with R lateral sideband. No pain with remaining movements.  MMT R hip flex: 4/5 L hip flex: 4/5. Hip abd and add: 5/5. B hip IR/ER: 5/5.  B knee flex and ext: 5/5.   Tight proximal hamstrings noted.      PT Education - 05/19/15 0940    Education  provided Yes   Education Details see above handout   Person(s) Educated Patient   Methods Explanation;Demonstration;Handout   Comprehension Verbalized understanding;Returned demonstration             PT Long Term Goals - 05/19/15 1236    PT LONG TERM GOAL #1   Title Pt will increase B hip flexion strength MMT to 4+/5 in order to be able to go bowling with family.   Baseline B hip flex: 4/5 on 2/15   Time 4   Period Weeks   Status New   PT LONG TERM GOAL #2   Title Pt will be able to sit for >30 mins with no reports of low back pain in order to be able to read/write comfortably.   Time 4   Period Weeks   Status New   PT LONG TERM GOAL #3   Title Pt. will participate with [employer]lver Sne[employer] ex. program on a consistent basis with no c/o pain.     Baseline Increase back pain currently with ex. program.    Time 4   Period Weeks   Status New   PT LONG  TERM GOAL #4   Title Pt will score <20% on ODI in order to decrease self-percieved disability/ improve overall pain-free function.   Baseline not completed on evaluation   Time 4   Period Weeks   Status New            Plan - 05/19/15 0944    Clinical Impression Statement Pt is a pleasant 70 y/o female who presents to PT with LBP. Pt states she has experienced intermittent LBP since she was 81-30 y/o but it has been getting worse. Pt states she had a bad episode this weekend where the pain increased to 8-9/10. The pain started on Saturday and kept increasing throughout Sunday when she was giving a speech and was standing in heelsfor 20-70mins. Pt also reports an increase in LBP when sitting for long periods of time, she does have lumbar support on her chair. Pt usually has no issues with sleeping through the night and sleeps either on her back or R side. The only instance of not being able to sleep through the night was this past weekend when she had the severe LBP episode. Pt states the pain has not really stopped her from performing everday tasks, if she does start to feel the pain she stops what she is doing. Pt does report she fell on R hip 1 wk ago but reports no pain in that area. 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 Lumbar repeated movements: pain at end range extension and R back pain with R lateral sideband. No pain with remaining movements. MMT: R hip flex: 4/5 L hip flex: 4/5. Hip abd and add: 5/5. B hip IR/ER: 5/5. B knee flex and ext: 5/5. Tight proximal hamstrings noted. Hypomobility noted at L1-L5. Pt benefits from skilled PT to increase flexibilty and strength in order to decrease pain and improve functional mobility. Pt benefits from manual techniques to increase mobility.    Pt will benefit from skilled therapeutic intervention in order to improve on the following deficits Decreased activity  tolerance;Decreased endurance;Decreased mobility;Hypomobility;Increased fascial restricitons;Pain;Improper body mechanics;Postural dysfunction;Decreased strength;Impaired flexibility   Rehab Potential Good   PT Frequency 2x / week   PT Duration 4 weeks   PT Treatment/Interventions ADLs/Self Care Home Management;Cryotherapy;Electrical Stimulation;Moist Heat;Ultrasound;Gait training;Stair training;Functional mobility training;Therapeutic activities;Therapeutic  exercise;Patient/family education;Manual techniques   PT Next Visit Plan core control/manual techniques to increase lumbar mobility/ODI questionnaire   PT Home Exercise Plan see above handout   Consulted and Agree with Plan of Care Patient          G-Codes - 06-03-2015 1721    Functional Assessment Tool Used Clinical judgement/ pain/ muscle weakness/ ODI   Functional Limitation Changing and maintaining body position   Changing and Maintaining Body Position Current Status [id number]) At least 20 percent but less than 40 percent impaired, limited or restricted   Changing and Maintaining Body Position Goal Status [id number]) At least 1 percent but less than 20 percent impaired, limited or restricted       Problem List Patient Active Problem List   Diagnosis Date Noted  . Vitamin D deficiency 09/18/2014  . Onychomycosis 09/16/2014  . Degenerative disc disease, lumbar 09/16/2014  . Benign essential HTN 10/10/2010  . LBP (low back pain) 10/10/2010  . OP (osteoporosis) 10/10/2010  . Type 2 diabetes mellitus, uncontrolled, with neuropathy ([place]) 10/10/2010   Pura Spice, PT, DPT # [id number]   03-Jun-2015, 5:24 PM  [facility] [address]. [place], [place], [address] Phone: [phone]   Fax:  [phone]  Name: Melinda Ford MRN: [id number] Date of Birth: 04/14/1945

## 2015-05-20 ENCOUNTER — Encounter: Payer: PPO | Admitting: Physical Therapy

## 2015-05-26 ENCOUNTER — Ambulatory Visit: Payer: PPO | Admitting: Physical Therapy

## 2015-05-26 ENCOUNTER — Encounter: Payer: Self-pay | Admitting: Physical Therapy

## 2015-05-26 DIAGNOSIS — M545 Low back pain, unspecified: Secondary | ICD-10-CM

## 2015-05-26 DIAGNOSIS — M2560 Stiffness of unspecified joint, not elsewhere classified: Secondary | ICD-10-CM

## 2015-05-26 DIAGNOSIS — M6281 Muscle weakness (generalized): Secondary | ICD-10-CM

## 2015-05-26 NOTE — Therapy (Signed)
[facility] [address]. [place], [place], [address] Phone: [phone]   Fax:  [phone]  Physical Therapy Treatment  Patient Details  Name: Melinda Ford MRN: [id number] Date of Birth: 06/05/1945 Referring Provider: Dr. Adline Potter  Encounter Date: 05/26/2015      PT End of Session - 05/26/15 1212    Visit Number 2   Number of Visits 8   Date for PT Re-Evaluation 06/15/15   PT Start Time 1058   PT Stop Time 1134   PT Time Calculation (min) 36 min   Activity Tolerance Patient tolerated treatment well   Behavior During Therapy [facility] for tasks assessed/performed      Past Medical History  Diagnosis Date  . Allergy   . Arthritis   . Cataract   . Diabetes mellitus without complication ([place])   . Hypertension   . Osteoporosis     Past Surgical History  Procedure Laterality Date  . Abdominal hysterectomy      There were no vitals filed for this visit.  Visit Diagnosis:  Bilateral low back pain without sciatica  Joint stiffness of spine  Muscle weakness      Subjective Assessment - 05/26/15 1211    Subjective Pt reports 0/10 pain today. Pt reports she has been completing stretches but has not been completing core activation exercises. Pt states she is feelnig a bit queasy today but is able to complete treatment session.   Pertinent History no xrays taken for fall on R hip 1 wk ago.   Limitations Sitting;Lifting   Patient Stated Goals decrease pain/be able to sit for longer periods of time   Currently in Pain? No/denies      Objectives: Ther ex: Core exercises all performed with core activation: supine core activation with 5 second holds x 10/supine posterior pelvic with 5 second holds x 20/supine heel slide x 20/supine march x 20/supine bridge x 20/supine straight leg raise x 20. Pt required constant verbal and tactile cueing throughout treatment session for core activation.   Pt response to treatment for  medical necessity: Pt had to leave treatment session early due to prior engagement. Pt will benefit from a core strengthening program in order to improve stability, decrease stress on back, and improve functional mobility.      PT Long Term Goals - 05/19/15 1236    PT LONG TERM GOAL #1   Title Pt will increase B hip flexion strength MMT to 4+/5 in order to be able to go bowling with family.   Baseline B hip flex: 4/5 on 2/15   Time 4   Period Weeks   Status New   PT LONG TERM GOAL #2   Title Pt will be able to sit for >30 mins with no reports of low back pain in order to be able to read/write comfortably.   Time 4   Period Weeks   Status New   PT LONG TERM GOAL #3   Title Pt. will participate with [employer] Sne[employer] ex. program on a consistent basis with no c/o pain.     Baseline Increase back pain currently with ex. program.    Time 4   Period Weeks   Status New   PT LONG TERM GOAL #4   Title Pt will score <20% on ODI in order to decrease self-percieved disability/ improve overall pain-free function.   Baseline not completed on evaluation   Time 4   Period Weeks   Status New  Pt requried verbal and tactile cueing constantly throughout treatment session for correct core activation technique. Pt demonstrated good form with supine heel slides, marches, and birdges with core activation. Pt also requires tactile cueing with supine straight leg raise to keep leg straight and raise to level of opposite knee. Pt was instructed on the importance of practicing core activation technique.   Problem List Patient Active Problem List   Diagnosis Date Noted  . Vitamin D deficiency 09/18/2014  . Onychomycosis 09/16/2014  . Degenerative disc disease, lumbar 09/16/2014  . Benign essential HTN 10/10/2010  . LBP (low back pain) 10/10/2010  . OP (osteoporosis) 10/10/2010  . Type 2 diabetes mellitus, uncontrolled, with neuropathy ([place]) 10/10/2010   Pura Spice, PT, DPT # [id number]    05/26/2015, 3:06 PM  [facility] [address]. [place], [place], [address] Phone: [phone]   Fax:  [phone]  Name: Melinda Ford MRN: [id number] Date of Birth: 02/15/1946

## 2015-06-09 ENCOUNTER — Other Ambulatory Visit: Payer: Self-pay

## 2015-06-09 MED ORDER — GLUCOSE BLOOD VI STRP: ORAL_STRIP | Status: DC

## 2015-06-09 NOTE — Telephone Encounter (Signed)
Patient called in requesting refill. States that she is completely out of test strips.

## 2015-06-10 ENCOUNTER — Other Ambulatory Visit: Payer: Self-pay

## 2015-06-10 DIAGNOSIS — E119 Type 2 diabetes mellitus without complications: Secondary | ICD-10-CM

## 2015-06-10 MED ORDER — GLUCOSE BLOOD VI STRP: 1.0000 | ORAL_STRIP | Freq: Three times a day (TID) | Status: DC

## 2015-06-30 ENCOUNTER — Telehealth: Payer: Self-pay

## 2015-06-30 DIAGNOSIS — IMO0001 Reserved for inherently not codable concepts without codable children: Secondary | ICD-10-CM

## 2015-06-30 DIAGNOSIS — E1165 Type 2 diabetes mellitus with hyperglycemia: Principal | ICD-10-CM

## 2015-06-30 MED ORDER — METFORMIN HCL ER 500 MG PO TB24
500.0000 mg | ORAL_TABLET | Freq: Two times a day (BID) | ORAL | Status: DC
Start: 2015-06-30 — End: 2015-08-16

## 2015-06-30 NOTE — Telephone Encounter (Deleted)
Informed patient I have called in refill on Metformin.[facility]

## 2015-06-30 NOTE — Telephone Encounter (Signed)
Ok thanks 

## 2015-06-30 NOTE — Telephone Encounter (Addendum)
Patient called twice today and left message yesterday. I attempted to send refill on Metformin but it did not go over. I called in Metformin 500mg  BID to [place]. [place] and needed today. Advised her since my VM states that I am only here Wed and Fri and she left messages Mon and Tue that she should be contacting pharmacy for refills as it will come right to PCP and that is also stated on my VM message.

## 2015-07-26 ENCOUNTER — Ambulatory Visit: Payer: Self-pay | Admitting: Family Medicine

## 2015-07-26 ENCOUNTER — Encounter: Payer: Self-pay | Admitting: Family Medicine

## 2015-07-26 ENCOUNTER — Ambulatory Visit (INDEPENDENT_AMBULATORY_CARE_PROVIDER_SITE_OTHER): Payer: PPO | Admitting: Family Medicine

## 2015-07-26 VITALS — BP 136/82 | HR 76 | Resp 16 | Ht 62.0 in | Wt 148.0 lb

## 2015-07-26 DIAGNOSIS — I10 Essential (primary) hypertension: Secondary | ICD-10-CM | POA: Diagnosis not present

## 2015-07-26 DIAGNOSIS — M5136 Other intervertebral disc degeneration, lumbar region: Secondary | ICD-10-CM | POA: Diagnosis not present

## 2015-07-26 DIAGNOSIS — H04123 Dry eye syndrome of bilateral lacrimal glands: Secondary | ICD-10-CM

## 2015-07-26 DIAGNOSIS — L659 Nonscarring hair loss, unspecified: Secondary | ICD-10-CM | POA: Diagnosis not present

## 2015-07-26 DIAGNOSIS — IMO0002 Reserved for concepts with insufficient information to code with codable children: Secondary | ICD-10-CM

## 2015-07-26 DIAGNOSIS — E559 Vitamin D deficiency, unspecified: Secondary | ICD-10-CM | POA: Diagnosis not present

## 2015-07-26 DIAGNOSIS — E1140 Type 2 diabetes mellitus with diabetic neuropathy, unspecified: Secondary | ICD-10-CM

## 2015-07-26 DIAGNOSIS — R413 Other amnesia: Secondary | ICD-10-CM | POA: Diagnosis not present

## 2015-07-26 DIAGNOSIS — E1165 Type 2 diabetes mellitus with hyperglycemia: Secondary | ICD-10-CM | POA: Diagnosis not present

## 2015-07-26 DIAGNOSIS — E119 Type 2 diabetes mellitus without complications: Secondary | ICD-10-CM | POA: Diagnosis not present

## 2015-07-26 LAB — GLUCOSE, POCT (MANUAL RESULT ENTRY): POC GLUCOSE: 295 mg/dL — AB (ref 70–99)

## 2015-07-26 MED ORDER — INSULIN GLARGINE 100 UNIT/ML ~~LOC~~ SOLN: 20.0000 [IU] | Freq: Every day | SUBCUTANEOUS | Status: DC

## 2015-07-26 NOTE — Progress Notes (Addendum)
Date:  07/26/2015   Name:  Melinda Ford   DOB:  [birth year]   MRN:  [id number]  PCP:  Adline Potter, MD    Chief Complaint: Diabetes; Hair/Scalp Problem; and Memory Loss   History of Present Illness:  This is a 70 y.o. female seen for three month f/u. Never got message to increase Lantus to 20 units daily and BG's continue to run high. Neuropathy sxs stable. Wants to know if can take herbal supplement containing chromium, gymneum, banaba leaf, cinnamon, and biotin. Has not been strict with diet lately. Also c/o hair loss (wearing wig) and dry eyes, has not seen optho in about a year. R hip and back pain improved with PT, taking Tylenol prn only. Never increased vit D dose to 4000 units daily. Concerned with recent memory loss.  Review of Systems:  Review of Systems  Constitutional: Negative for fever.  Respiratory: Negative for cough and shortness of breath.   Cardiovascular: Negative for chest pain and leg swelling.  Neurological: Negative for syncope and light-headedness.    Patient Active Problem List   Diagnosis Date Noted  . Vitamin D deficiency 09/18/2014  . Onychomycosis 09/16/2014  . Degenerative disc disease, lumbar 09/16/2014  . Benign essential HTN 10/10/2010  . LBP (low back pain) 10/10/2010  . OP (osteoporosis) 10/10/2010  . Type 2 diabetes mellitus, uncontrolled, with neuropathy ([place]) 10/10/2010    Prior to Admission medications   Medication Sig Start Date End Date Taking? Authorizing Provider  acetaminophen (TYLENOL) 500 MG tablet Take 1,000 mg by mouth 2 (two) times daily.   Yes Historical Provider, MD  amLODipine (NORVASC) 10 MG tablet Take 1 tablet (10 mg total) by mouth daily. 04/14/15 04/13/16 Yes Adline Potter, MD  aspirin 325 MG tablet Take 325 mg by mouth daily. Pt taking twice a week   Yes Historical Provider, MD  Blood Glucose Monitoring Suppl (FIFTY50 GLUCOSE METER 2.0) W/DEVICE KIT 1 Units by Other route 3 (three) times daily as needed. 01/20/13  Yes  Historical Provider, MD  Cholecalciferol (VITAMIN D) 2000 UNITS CAPS Take 1 capsule (2,000 Units total) by mouth daily. 09/18/14  Yes Adline Potter, MD  GINKGO BILOBA COMPLEX PO Take by mouth.   Yes Historical Provider, MD  glucose blood (GLUCOSE METER TEST) test strip 1 each by Other route 3 (three) times daily. Use for Diabetes Mellitus Type 2 to check Blood Glucose levels up to 3 times daily. 06/10/15  Yes Adline Potter, MD  insulin glargine (LANTUS) 100 UNIT/ML injection Inject 0.2 mLs (20 Units total) into the skin daily. 07/26/15 07/25/16 Yes Adline Potter, MD  Insulin Syringe-Needle U-100 (INSULIN SYRINGE .3CC/31GX5/16") 31G X 5/16" 0.3 ML MISC Inject 1 Units as directed 2 (two) times daily. 01/27/15  Yes Adline Potter, MD  metFORMIN (GLUCOPHAGE-XR) 500 MG 24 hr tablet Take 1 tablet (500 mg total) by mouth 2 (two) times daily. 06/30/15 06/29/16 Yes Adline Potter, MD  ReliOn Ultra Thin Lancets MISC Inject 1 Units as directed 3 (three) times daily as needed. 05/06/14  Yes Historical Provider, MD  sitaGLIPtin (JANUVIA) 100 MG tablet Take 1 tablet by mouth daily. 05/04/14 07/26/15 Yes Historical Provider, MD  losartan (COZAAR) 100 MG tablet Take 1 tablet (100 mg total) by mouth daily. 09/25/14 09/26/15  Adline Potter, MD    No Known Allergies  Past Surgical History  Procedure Laterality Date  . Abdominal hysterectomy      Social History  Substance Use Topics  . Smoking status: Never Smoker   .  Smokeless tobacco: Never Used  . Alcohol Use: 0.6 oz/week    1 Standard drinks or equivalent per week    Family History  Problem Relation Age of Onset  . Alcohol abuse Father   . Mental illness Father   . Stroke Father   . Diabetes Sister   . Asthma Brother   . Diabetes Brother   . Diabetes Maternal Aunt   . Heart disease Maternal Aunt   . Diabetes Maternal Uncle   . Early death Maternal Uncle   . Breast cancer Cousin 17  . Breast cancer Other 40    Medication list has been reviewed and  updated.  Physical Examination: BP 136/82 mmHg  Pulse 76  Resp 16  Ht _0  (1.575 m)  Wt 148 lb (67.132 kg)  BMI 27.06 kg/m2  SpO2 97%  Physical Exam  Constitutional: She appears well-developed and well-nourished.  Cardiovascular: Normal rate, regular rhythm and normal heart sounds.   Pulmonary/Chest: Effort normal and breath sounds normal.  Musculoskeletal: She exhibits no edema.  Neurological: She is alert.  Skin: Skin is warm and dry.  Psychiatric: She has a normal mood and affect. Her behavior is normal.  Nursing note and vitals reviewed.   Assessment and Plan:  1. Type 2 diabetes mellitus, uncontrolled, with neuropathy (HCC) FSBG 295 today, check a1c, increase Lantus to 20 units daily, ok to try herbal supplement  2. Benign essential HTN Well controlled on amlodipine/losartan   3. Vitamin D deficiency On supplement - Vitamin D (25 hydroxy)  4. Degenerative disc disease, lumbar Improved with PT, prn Tylenol  5. Hair loss Unclear etiology (TSH ok in June), follow for now  6. Dry eyes Recommend f/u optho next month  7. Subjective memory loss MMSE 29/30, reassured no evidence current cognitive impairment  Return in about 3 months (around 10/25/2015).  [name]. [facility]  07/26/2015

## 2015-07-28 DIAGNOSIS — E559 Vitamin D deficiency, unspecified: Secondary | ICD-10-CM | POA: Diagnosis not present

## 2015-07-28 DIAGNOSIS — E119 Type 2 diabetes mellitus without complications: Secondary | ICD-10-CM | POA: Diagnosis not present

## 2015-07-29 LAB — HEMOGLOBIN A1C
ESTIMATED AVERAGE GLUCOSE: 220 mg/dL
Hgb A1c MFr Bld: 9.3 % — ABNORMAL HIGH (ref 4.8–5.6)

## 2015-07-29 LAB — VITAMIN D 25 HYDROXY (VIT D DEFICIENCY, FRACTURES): VIT D 25 HYDROXY: 31.3 ng/mL (ref 30.0–100.0)

## 2015-08-16 ENCOUNTER — Other Ambulatory Visit: Payer: Self-pay | Admitting: Family Medicine

## 2015-08-16 DIAGNOSIS — IMO0001 Reserved for inherently not codable concepts without codable children: Secondary | ICD-10-CM

## 2015-08-16 DIAGNOSIS — E1165 Type 2 diabetes mellitus with hyperglycemia: Principal | ICD-10-CM

## 2015-08-16 MED ORDER — METFORMIN HCL 500 MG PO TABS: 500.0000 mg | ORAL_TABLET | Freq: Two times a day (BID) | ORAL | Status: DC

## 2015-08-25 DIAGNOSIS — B351 Tinea unguium: Secondary | ICD-10-CM | POA: Diagnosis not present

## 2015-08-25 DIAGNOSIS — E1142 Type 2 diabetes mellitus with diabetic polyneuropathy: Secondary | ICD-10-CM | POA: Diagnosis not present

## 2015-08-26 ENCOUNTER — Other Ambulatory Visit: Payer: Self-pay | Admitting: Family Medicine

## 2015-08-26 ENCOUNTER — Telehealth: Payer: Self-pay

## 2015-08-26 MED ORDER — SITAGLIPTIN PHOSPHATE 100 MG PO TABS: 100.0000 mg | ORAL_TABLET | Freq: Every day | ORAL | Status: DC

## 2015-08-26 NOTE — Telephone Encounter (Signed)
Done

## 2015-08-26 NOTE — Telephone Encounter (Signed)
Patient ordered Melinda Ford to save money but has been waiting long time. She is requesting we send 10 day supply to pharmacy. I did not see any coupons.

## 2015-09-10 ENCOUNTER — Encounter: Payer: Self-pay | Admitting: Family Medicine

## 2015-09-10 ENCOUNTER — Ambulatory Visit (INDEPENDENT_AMBULATORY_CARE_PROVIDER_SITE_OTHER): Payer: PPO | Admitting: Family Medicine

## 2015-09-10 VITALS — BP 128/84 | HR 71 | Temp 97.7°F | Resp 16 | Ht 62.0 in | Wt 146.0 lb

## 2015-09-10 DIAGNOSIS — E1140 Type 2 diabetes mellitus with diabetic neuropathy, unspecified: Secondary | ICD-10-CM | POA: Diagnosis not present

## 2015-09-10 DIAGNOSIS — E1165 Type 2 diabetes mellitus with hyperglycemia: Secondary | ICD-10-CM

## 2015-09-10 DIAGNOSIS — J40 Bronchitis, not specified as acute or chronic: Secondary | ICD-10-CM | POA: Diagnosis not present

## 2015-09-10 DIAGNOSIS — E559 Vitamin D deficiency, unspecified: Secondary | ICD-10-CM

## 2015-09-10 DIAGNOSIS — I10 Essential (primary) hypertension: Secondary | ICD-10-CM | POA: Diagnosis not present

## 2015-09-10 DIAGNOSIS — IMO0002 Reserved for concepts with insufficient information to code with codable children: Secondary | ICD-10-CM

## 2015-09-10 MED ORDER — AZITHROMYCIN 250 MG PO TABS
ORAL_TABLET | ORAL | Status: DC
Start: 2015-09-10 — End: 2015-10-01

## 2015-09-10 NOTE — Progress Notes (Signed)
Date:  09/10/2015   Name:  Melinda Ford   DOB:  Mar 06, 1946   MRN:  [id number]  PCP:  Adline Potter, MD    Chief Complaint: Sinus Problem and Diabetes   History of Present Illness:  This is a 70 y.o. female seen for two month f/u. Lantus increased to 20 units daily after last visit for a1c 9.3%. BG's still running high so pt has increased metformin dose to 1000 mg bid which she has been tolerating. C/o 8d hx of cough prod yellow phlegm, rhinorrhea, sinus congestion, sore throat, occ subj fever.  Review of Systems:  Review of Systems  Respiratory: Negative for shortness of breath.   Cardiovascular: Negative for chest pain and leg swelling.  Endocrine: Negative for polyuria.  Genitourinary: Negative for difficulty urinating.  Neurological: Negative for syncope and light-headedness.    Patient Active Problem List   Diagnosis Date Noted  . Vitamin D deficiency 09/18/2014  . Onychomycosis 09/16/2014  . Degenerative disc disease, lumbar 09/16/2014  . Benign essential HTN 10/10/2010  . LBP (low back pain) 10/10/2010  . OP (osteoporosis) 10/10/2010  . Type 2 diabetes mellitus, uncontrolled, with neuropathy ([place]) 10/10/2010    Prior to Admission medications   Medication Sig Start Date End Date Taking? Authorizing Provider  acetaminophen (TYLENOL) 500 MG tablet Take 1,000 mg by mouth 2 (two) times daily.   Yes Historical Provider, MD  amLODipine (NORVASC) 10 MG tablet Take 1 tablet (10 mg total) by mouth daily. 04/14/15 04/13/16 Yes Adline Potter, MD  aspirin 325 MG tablet Take 325 mg by mouth daily. Pt taking twice a week   Yes Historical Provider, MD  Blood Glucose Monitoring Suppl (FIFTY50 GLUCOSE METER 2.0) W/DEVICE KIT 1 Units by Other route 3 (three) times daily as needed. 01/20/13  Yes Historical Provider, MD  Cholecalciferol (VITAMIN D) 2000 UNITS CAPS Take 1 capsule (2,000 Units total) by mouth daily. 09/18/14  Yes Adline Potter, MD  dextromethorphan-guaiFENesin [facility] DM) 30-600  MG 12hr tablet Take 1 tablet by mouth 2 (two) times daily.   Yes Historical Provider, MD  Evelina Bucy BILOBA COMPLEX PO Take by mouth.   Yes Historical Provider, MD  glucose blood (GLUCOSE METER TEST) test strip 1 each by Other route 3 (three) times daily. Use for Diabetes Mellitus Type 2 to check Blood Glucose levels up to 3 times daily. 06/10/15  Yes Adline Potter, MD  insulin glargine (LANTUS) 100 UNIT/ML injection Inject 0.2 mLs (20 Units total) into the skin daily. 07/26/15 07/25/16 Yes Adline Potter, MD  Insulin Syringe-Needle U-100 (INSULIN SYRINGE .3CC/31GX5/16") 31G X 5/16" 0.3 ML MISC Inject 1 Units as directed 2 (two) times daily. 01/27/15  Yes Adline Potter, MD  losartan (COZAAR) 100 MG tablet Take 1 tablet (100 mg total) by mouth daily. 09/25/14 09/26/15 Yes Adline Potter, MD  metFORMIN (GLUCOPHAGE) 500 MG tablet Take 1 tablet (500 mg total) by mouth 2 (two) times daily with a meal. 08/16/15  Yes Adline Potter, MD  ReliOn Ultra Thin Lancets MISC Inject 1 Units as directed 3 (three) times daily as needed. 05/06/14  Yes Historical Provider, MD  sitaGLIPtin (JANUVIA) 100 MG tablet Take 1 tablet (100 mg total) by mouth daily. 08/26/15 11/16/16 Yes Adline Potter, MD  azithromycin (ZITHROMAX) 250 MG tablet Take two tablets today then one tablet daily for four days. 09/10/15   Adline Potter, MD    No Known Allergies  Past Surgical History  Procedure Laterality Date  . Abdominal hysterectomy      Social  History  Substance Use Topics  . Smoking status: Never Smoker   . Smokeless tobacco: Never Used  . Alcohol Use: 0.6 oz/week    1 Standard drinks or equivalent per week    Family History  Problem Relation Age of Onset  . Alcohol abuse Father   . Mental illness Father   . Stroke Father   . Diabetes Sister   . Asthma Brother   . Diabetes Brother   . Diabetes Maternal Aunt   . Heart disease Maternal Aunt   . Diabetes Maternal Uncle   . Early death Maternal Uncle   . Breast cancer Cousin 8  .  Breast cancer Other 40    Medication list has been reviewed and updated.  Physical Examination: BP 128/84 mmHg  Pulse 71  Temp(Src) 97.7 F (36.5 C)  Resp 16  Ht _0  (1.575 m)  Wt 146 lb (66.225 kg)  BMI 26.70 kg/m2  SpO2 99%  Physical Exam  Constitutional: She appears well-developed and well-nourished.  HENT:  Mouth/Throat: Oropharynx is clear and moist.  B TM's obscured by cerumen  Eyes: Conjunctivae are normal.  Neck: Neck supple.  Cardiovascular: Normal rate, regular rhythm and normal heart sounds.   Pulmonary/Chest: Effort normal and breath sounds normal.  Musculoskeletal: She exhibits no edema.  Lymphadenopathy:    She has no cervical adenopathy.  Neurological: She is alert.  Skin: Skin is warm and dry.  Psychiatric: She has a normal mood and affect. Her behavior is normal.  Nursing note and vitals reviewed.   Assessment and Plan:  1. Bronchitis Zpak as directed, call if sxs worsen/persist  2. Type 2 diabetes mellitus, uncontrolled, with neuropathy (HCC) Consider increase metformin and/or Lantus, d/c Januvia, decrease asa to 81 mg daily - HgB A1c - Urine Microalbumin w/creat. ratio  3. Benign essential HTN Well controlled on losartan/amlodipine - Comprehensive Metabolic Panel (CMET)  4. Vitamin D deficiency Well controlled on supplement  Return in about 3 months (around 12/11/2015).  [name]. [facility]  09/10/2015

## 2015-09-11 LAB — COMPREHENSIVE METABOLIC PANEL
A/G RATIO: 1.6 (ref 1.2–2.2)
ALBUMIN: 4.2 g/dL (ref 3.5–4.8)
ALK PHOS: 69 IU/L (ref 39–117)
ALT: 17 IU/L (ref 0–32)
AST: 14 IU/L (ref 0–40)
BILIRUBIN TOTAL: 0.5 mg/dL (ref 0.0–1.2)
BUN / CREAT RATIO: 35 — AB (ref 12–28)
BUN: 18 mg/dL (ref 8–27)
CHLORIDE: 100 mmol/L (ref 96–106)
CO2: 23 mmol/L (ref 18–29)
Calcium: 9.9 mg/dL (ref 8.7–10.3)
Creatinine, Ser: 0.51 mg/dL — ABNORMAL LOW (ref 0.57–1.00)
GFR calc non Af Amer: 98 mL/min/{1.73_m2} (ref >59)
GFR, EST AFRICAN AMERICAN: 113 mL/min/{1.73_m2} (ref >59)
Globulin, Total: 2.6 g/dL (ref 1.5–4.5)
Glucose: 163 mg/dL — ABNORMAL HIGH (ref 65–99)
POTASSIUM: 4.3 mmol/L (ref 3.5–5.2)
Sodium: 139 mmol/L (ref 134–144)
TOTAL PROTEIN: 6.8 g/dL (ref 6.0–8.5)

## 2015-09-11 LAB — MICROALBUMIN / CREATININE URINE RATIO
CREATININE, UR: 116.5 mg/dL
MICROALB/CREAT RATIO: 4 mg/g{creat} (ref 0.0–30.0)
Microalbumin, Urine: 4.7 ug/mL

## 2015-09-11 LAB — HEMOGLOBIN A1C
ESTIMATED AVERAGE GLUCOSE: 220 mg/dL
HEMOGLOBIN A1C: 9.3 % — AB (ref 4.8–5.6)

## 2015-09-13 ENCOUNTER — Other Ambulatory Visit: Payer: Self-pay | Admitting: Family Medicine

## 2015-09-13 MED ORDER — METFORMIN HCL 1000 MG PO TABS: 1000.0000 mg | ORAL_TABLET | Freq: Two times a day (BID) | ORAL | Status: DC

## 2015-09-13 MED ORDER — INSULIN GLARGINE 100 UNIT/ML ~~LOC~~ SOLN: 30.0000 [IU] | Freq: Every day | SUBCUTANEOUS | Status: DC

## 2015-09-17 ENCOUNTER — Telehealth: Payer: Self-pay

## 2015-09-17 NOTE — Telephone Encounter (Signed)
Patient would like a script to receive diabatic shoes. Patient stated she sees Dr.Fowler for nail cutting if you need notes on that for the script. Please call back patient if any questions and patient stated it is okay to leave a detailed message on her voicemail.

## 2015-09-22 NOTE — Telephone Encounter (Signed)
Can we send this in? Diabetic Shoe Rx

## 2015-09-22 NOTE — Telephone Encounter (Signed)
Shoe orders usually come from podiatrist, can we call them and see if they can take care of this?

## 2015-09-26 ENCOUNTER — Other Ambulatory Visit: Payer: Self-pay | Admitting: Family Medicine

## 2015-09-29 ENCOUNTER — Telehealth: Payer: Self-pay

## 2015-09-29 DIAGNOSIS — I10 Essential (primary) hypertension: Secondary | ICD-10-CM

## 2015-09-29 MED ORDER — LOSARTAN POTASSIUM 100 MG PO TABS: 100.0000 mg | ORAL_TABLET | Freq: Every day | ORAL | Status: DC

## 2015-09-29 NOTE — Addendum Note (Signed)
Addended by: Adline Potter on: 09/29/2015 04:36 PM   Modules accepted: Orders

## 2015-09-29 NOTE — Telephone Encounter (Signed)
Sent to [facility] 

## 2015-10-01 ENCOUNTER — Encounter: Payer: Self-pay | Admitting: Family Medicine

## 2015-10-01 ENCOUNTER — Ambulatory Visit (INDEPENDENT_AMBULATORY_CARE_PROVIDER_SITE_OTHER): Payer: PPO | Admitting: Family Medicine

## 2015-10-01 VITALS — BP 110/62 | HR 64 | Ht 62.0 in | Wt 142.0 lb

## 2015-10-01 DIAGNOSIS — R1013 Epigastric pain: Secondary | ICD-10-CM

## 2015-10-01 DIAGNOSIS — E663 Overweight: Secondary | ICD-10-CM | POA: Diagnosis not present

## 2015-10-01 DIAGNOSIS — E1165 Type 2 diabetes mellitus with hyperglycemia: Secondary | ICD-10-CM | POA: Diagnosis not present

## 2015-10-01 DIAGNOSIS — I10 Essential (primary) hypertension: Secondary | ICD-10-CM | POA: Diagnosis not present

## 2015-10-01 DIAGNOSIS — E1140 Type 2 diabetes mellitus with diabetic neuropathy, unspecified: Secondary | ICD-10-CM | POA: Diagnosis not present

## 2015-10-01 DIAGNOSIS — IMO0002 Reserved for concepts with insufficient information to code with codable children: Secondary | ICD-10-CM

## 2015-10-01 DIAGNOSIS — E559 Vitamin D deficiency, unspecified: Secondary | ICD-10-CM | POA: Diagnosis not present

## 2015-10-01 MED ORDER — METFORMIN HCL 500 MG PO TABS: 500.0000 mg | ORAL_TABLET | Freq: Two times a day (BID) | ORAL | Status: DC

## 2015-10-01 MED ORDER — ASPIRIN 81 MG PO TABS: 81.0000 mg | ORAL_TABLET | Freq: Every day | ORAL | Status: DC

## 2015-10-01 NOTE — Progress Notes (Addendum)
Date:  10/01/2015   Name:  Melinda Ford   DOB:  1945-07-26   MRN:  [id number]  PCP:  Adline Potter, MD    Chief Complaint: Diabetes   History of Present Illness:  This is a 70 y.o. female seen in three week f/u for uncontrolled T2DM. A1c last visit 9.3%, metformin increased to 1000 mg bid and Lantus to 30 units daily with FBG's around 120 but pt unable to tolerate increased metformin dose due to nausea and diarrhea so decreased to 1000/500 with some persistent nausea after 1000 mg dose. FBG's now around 150, CMP/MCR normal last visit. Reports nonspecific intermittent epigastric pain for past year, non-positional, unrelated to eating or BM's. Needs form completed for diabetic shoes.  Review of Systems:  Review of Systems  Constitutional: Negative for fever and fatigue.  Respiratory: Negative for cough and shortness of breath.   Cardiovascular: Negative for chest pain and leg swelling.  Gastrointestinal: Negative for blood in stool and abdominal distention.  Endocrine: Negative for polyuria.  Genitourinary: Negative for difficulty urinating.  Neurological: Negative for syncope and light-headedness.    Patient Active Problem List   Diagnosis Date Noted  . Overweight (BMI 25.0-29.9) 10/01/2015  . Vitamin D deficiency 09/18/2014  . Onychomycosis 09/16/2014  . Degenerative disc disease, lumbar 09/16/2014  . Benign essential HTN 10/10/2010  . LBP (low back pain) 10/10/2010  . OP (osteoporosis) 10/10/2010  . Type 2 diabetes mellitus, uncontrolled, with neuropathy ([place]) 10/10/2010    Prior to Admission medications   Medication Sig Start Date End Date Taking? Authorizing Provider  acetaminophen (TYLENOL) 500 MG tablet Take 1,000 mg by mouth 2 (two) times daily.   Yes Historical Provider, MD  amLODipine (NORVASC) 10 MG tablet Take 1 tablet (10 mg total) by mouth daily. 04/14/15 04/13/16 Yes Adline Potter, MD  Blood Glucose Monitoring Suppl (FIFTY50 GLUCOSE METER 2.0) W/DEVICE KIT 1 Units  by Other route 3 (three) times daily as needed. 01/20/13  Yes Historical Provider, MD  Cholecalciferol (VITAMIN D) 2000 UNITS CAPS Take 1 capsule (2,000 Units total) by mouth daily. 09/18/14  Yes Adline Potter, MD  GINKGO BILOBA COMPLEX PO Take by mouth.   Yes Historical Provider, MD  glucose blood (GLUCOSE METER TEST) test strip 1 each by Other route 3 (three) times daily. Use for Diabetes Mellitus Type 2 to check Blood Glucose levels up to 3 times daily. 06/10/15  Yes Adline Potter, MD  insulin glargine (LANTUS) 100 UNIT/ML injection Inject 0.3 mLs (30 Units total) into the skin daily. 09/13/15 09/12/16 Yes Adline Potter, MD  Insulin Syringe-Needle U-100 (INSULIN SYRINGE .3CC/31GX5/16") 31G X 5/16" 0.3 ML MISC Inject 1 Units as directed 2 (two) times daily. 01/27/15  Yes Adline Potter, MD  losartan (COZAAR) 100 MG tablet Take 1 tablet (100 mg total) by mouth daily. 09/29/15 09/29/16 Yes Adline Potter, MD  metFORMIN (GLUCOPHAGE) 500 MG tablet Take 1 tablet (500 mg total) by mouth 2 (two) times daily with a meal. 10/01/15  Yes Adline Potter, MD  ReliOn Ultra Thin Lancets MISC Inject 1 Units as directed 3 (three) times daily as needed. 05/06/14  Yes Historical Provider, MD  aspirin 81 MG tablet Take 1 tablet (81 mg total) by mouth daily. 10/01/15   Adline Potter, MD    No Known Allergies  Past Surgical History  Procedure Laterality Date  . Abdominal hysterectomy      Social History  Substance Use Topics  . Smoking status: Never Smoker   . Smokeless tobacco: Never Used  .  Alcohol Use: 0.6 oz/week    1 Standard drinks or equivalent per week    Family History  Problem Relation Age of Onset  . Alcohol abuse Father   . Mental illness Father   . Stroke Father   . Diabetes Sister   . Asthma Brother   . Diabetes Brother   . Diabetes Maternal Aunt   . Heart disease Maternal Aunt   . Diabetes Maternal Uncle   . Early death Maternal Uncle   . Breast cancer Cousin 38  . Breast cancer Other 40     Medication list has been reviewed and updated.  Physical Examination: BP 110/62 mmHg  Pulse 64  Ht 5' 2"  (1.575 m)  Wt 142 lb (64.411 kg)  BMI 25.97 kg/m2  Physical Exam  Constitutional: She appears well-developed and well-nourished.  Cardiovascular: Normal rate and regular rhythm.   Pulmonary/Chest: Effort normal and breath sounds normal.  Abdominal: Soft. She exhibits no distension and no mass. There is no tenderness.  Musculoskeletal: She exhibits no edema.  B feet without lesions  Neurological: She is alert.  Skin: Skin is warm and dry.  Psychiatric: She has a normal mood and affect. Her behavior is normal.  Nursing note and vitals reviewed.   Assessment and Plan:  1. Type 2 diabetes mellitus, uncontrolled, with neuropathy (HCC) Decrease metformin to 500 mg bid due to nausea, keep Lantus at 30 units daily, consider repeat a1c in one month, form for diabetic shoes completed  2. Epigastric pain, intermittent Nonspecific, may improve with decreased metformin and asa doses, discussed possible abdominal US if sxs worsen/persist  3. Benign essential HTN Well controlled on amlodipine/losartan, decrease asa to 81 mg daily, consider d/c in future as no clear indication for use  4. Vitamin D deficiency Well controlled on supplement  5. Overweight (BMI 25.0-29.9) Weight down 6# since April, discussed further exercise/weight loss  Return in about 4 weeks (around 10/29/2015).  [name]. [facility]  10/01/2015

## 2015-10-01 NOTE — Patient Instructions (Addendum)
Decrease metformin to 500 mg twice daily and aspirin to 81 mg daily.

## 2015-10-08 ENCOUNTER — Ambulatory Visit
Admission: EM | Admit: 2015-10-08 | Discharge: 2015-10-08 | Disposition: A | Discharge status: Home / Self Care | Payer: PPO | Attending: Family Medicine | Admitting: Family Medicine

## 2015-10-08 ENCOUNTER — Ambulatory Visit (INDEPENDENT_AMBULATORY_CARE_PROVIDER_SITE_OTHER): Payer: PPO

## 2015-10-08 ENCOUNTER — Encounter: Payer: Self-pay | Admitting: *Deleted

## 2015-10-08 DIAGNOSIS — S92355A Nondisplaced fracture of fifth metatarsal bone, left foot, initial encounter for closed fracture: Secondary | ICD-10-CM | POA: Diagnosis not present

## 2015-10-08 DIAGNOSIS — S92352A Displaced fracture of fifth metatarsal bone, left foot, initial encounter for closed fracture: Secondary | ICD-10-CM

## 2015-10-08 MED ORDER — MELOXICAM 15 MG PO TABS: 15.0000 mg | ORAL_TABLET | Freq: Every day | ORAL | Status: DC

## 2015-10-08 NOTE — ED Provider Notes (Signed)
CSN: [id number]     Arrival date & time 10/08/15  1357 History   First MD Initiated Contact with Patient 10/08/15 1430     Chief Complaint  Patient presents with  . Foot Injury   (Consider location/radiation/quality/duration/timing/severity/associated sxs/prior Treatment) HPI  This is a 70-year-old female who presents with left foot pain indicating M midfoot and lateral after she dipped in a parking lot. She states that she just finished exercising walked outside and missed a step opening over the step and landing on her right knee. There is an abrasion there but is no discomfort or deformity. Is able to walk on her foot but with a limp.     Past Medical History  Diagnosis Date  . Allergy   . Arthritis   . Cataract   . Diabetes mellitus without complication (HCC)   . Hypertension   . Osteoporosis    Past Surgical History  Procedure Laterality Date  . Abdominal hysterectomy     Family History  Problem Relation Age of Onset  . Alcohol abuse Father   . Mental illness Father   . Stroke Father   . Diabetes Sister   . Asthma Brother   . Diabetes Brother   . Diabetes Maternal Aunt   . Heart disease Maternal Aunt   . Diabetes Maternal Uncle   . Early death Maternal Uncle   . Breast cancer Cousin 54  . Breast cancer Other 40   Social History  Substance Use Topics  . Smoking status: Never Smoker   . Smokeless tobacco: Never Used  . Alcohol Use: 0.6 oz/week    1 Standard drinks or equivalent per week   OB History    No data available     Review of Systems  Constitutional: Positive for activity change. Negative for fever, chills and fatigue.  Musculoskeletal: Positive for gait problem.  All other systems reviewed and are negative.   Allergies  Review of patient's allergies indicates no known allergies.  Home Medications   Prior to Admission medications   Medication Sig Start Date End Date Taking? Authorizing Provider  acetaminophen (TYLENOL) 500 MG tablet Take  1,000 mg by mouth 2 (two) times daily.   Yes Historical Provider, MD  amLODipine (NORVASC) 10 MG tablet Take 1 tablet (10 mg total) by mouth daily. 04/14/15 04/13/16 Yes [name], MD  aspirin 81 MG tablet Take 1 tablet (81 mg total) by mouth daily. 10/01/15  Yes [name], MD  Blood Glucose Monitoring Suppl (FIFTY50 GLUCOSE METER 2.0) W/DEVICE KIT 1 Units by Other route 3 (three) times daily as needed. 01/20/13  Yes Historical Provider, MD  Cholecalciferol (VITAMIN D) 2000 UNITS CAPS Take 1 capsule (2,000 Units total) by mouth daily. 09/18/14  Yes [name], MD  GINKGO BILOBA COMPLEX PO Take by mouth.   Yes Historical Provider, MD  glucose blood (GLUCOSE METER TEST) test strip 1 each by Other route 3 (three) times daily. Use for Diabetes Mellitus Type 2 to check Blood Glucose levels up to 3 times daily. 06/10/15  Yes [name], MD  insulin glargine (LANTUS) 100 UNIT/ML injection Inject 0.3 mLs (30 Units total) into the skin daily. 09/13/15 09/12/16 Yes [name], MD  Insulin Syringe-Needle U-100 (INSULIN SYRINGE .3CC/31GX5/16") 31G X 5/16" 0.3 ML MISC Inject 1 Units as directed 2 (two) times daily. 01/27/15  Yes [name], MD  losartan (COZAAR) 100 MG tablet Take 1 tablet (100 mg total) by mouth daily. 09/29/15 09/29/16 Yes [name], MD  metFORMIN (GLUCOPHAGE)   500 MG tablet Take 1 tablet (500 mg total) by mouth 2 (two) times daily with a meal. 10/01/15  Yes [name], MD  ReliOn Ultra Thin Lancets MISC Inject 1 Units as directed 3 (three) times daily as needed. 05/06/14  Yes Historical Provider, MD  meloxicam (MOBIC) 15 MG tablet Take 1 tablet (15 mg total) by mouth daily. 10/08/15   William P Roemer, PA-C   Meds Ordered and Administered this Visit  Medications - No data to display  BP 126/82 mmHg  Pulse 68  Temp(Src) 97.7 F (36.5 C) (Oral)  Resp 18  Ht 5' 2" (1.575 m)  Wt 142 lb (64.411 kg)  BMI 25.97 kg/m2  SpO2 100% No data found.   Physical Exam  Nursing note and  vitals reviewed.   ED Course  Procedures (including critical care time)  Labs Review Labs Reviewed - No data to display  Imaging Review Dg Foot Complete Left  10/08/2015  CLINICAL DATA:  Stepped off a curb injuring lateral side of LEFT foot today, fall, no prior injuries, initial encounter EXAM: LEFT FOOT - COMPLETE 3+ VIEW COMPARISON:  None FINDINGS: Diffuse osseous demineralization. Minimal joint space narrowing first MTP joint with hallux valgus and medial soft tissue swelling. Transverse nondisplaced fracture at base of fifth metatarsal. No additional fracture, dislocation, or bone destruction. Tiny plantar calcaneal spur. IMPRESSION: Nondisplaced transverse fracture at base of LEFT fifth metatarsal. Osseous demineralization. Electronically Signed   By: Mark  Boles M.D.   On: 10/08/2015 15:21     Visual Acuity Review  Right Eye Distance:   Left Eye Distance:   Bilateral Distance:    Right Eye Near:   Left Eye Near:    Bilateral Near:         MDM   1. Fracture of fifth metatarsal bone of left foot, closed, initial encounter    Discharge Medication List as of 10/08/2015  4:21 PM    START taking these medications   Details  meloxicam (MOBIC) 15 MG tablet Take 1 tablet (15 mg total) by mouth daily., Starting 10/08/2015, Until Discontinued, Normal      Plan: 1. Test/x-ray results and diagnosis reviewed with patient 2. rx as per orders; risks, benefits, potential side effects reviewed with patient 3. Recommend supportive treatment with Elevation and ice to control swelling and for comfort. Patient does not wish to take narcotics because they cause her to be terribly constipated. Instead she would prefer to take Tylenol and I recommended an anti-inflammatory for possible nighttime use if necessary. She has a with be certain that she is seen in the past and will make an appointment with him next week. The patient has 3 different orthotic boots at home and requested that she could  stay here until her husband could bring the boot to the facility for our approval. He did bring a adequate orthosis that we added an extra straps for better support. He should remain off the foot is much as feasible until she is seen and evaluated by the orthopedic surgeon 4. F/u prn if symptoms worsen or don't improve     William P Roemer, PA-C 10/08/15 1634 

## 2015-10-08 NOTE — ED Notes (Signed)
Patient injured her left foot today while walking to her car in a parking lot. Patient states that she tripped over a section of uneven sidewalk and fell injuring her left foot. Upon inspection her right knee suffered an abrasion during the fall.

## 2015-10-08 NOTE — Discharge Instructions (Signed)
°Cast or Splint Care  ° ° °Casts and splints support injured limbs and keep bones from moving while they heal. It is important to care for your cast or splint at home.  °HOME CARE INSTRUCTIONS  °Keep the cast or splint uncovered during the drying period. It can take 24 to 48 hours to dry if it is made of plaster. A fiberglass cast will dry in less than 1 hour.  °Do not rest the cast on anything harder than a pillow for the first 24 hours.  °Do not put weight on your injured limb or apply pressure to the cast until your health care provider gives you permission.  °Keep the cast or splint dry. Wet casts or splints can lose their shape and may not support the limb as well. A wet cast that has lost its shape can also create harmful pressure on your skin when it dries. Also, wet skin can become infected.  °Cover the cast or splint with a plastic bag when bathing or when out in the rain or snow. If the cast is on the trunk of the body, take sponge baths until the cast is removed.  °If your cast does become wet, dry it with a towel or a blow dryer on the cool setting only. °Keep your cast or splint clean. Soiled casts may be wiped with a moistened cloth.  °Do not place any hard or soft foreign objects under your cast or splint, such as cotton, toilet paper, lotion, or powder.  °Do not try to scratch the skin under the cast with any object. The object could get stuck inside the cast. Also, scratching could lead to an infection. If itching is a problem, use a blow dryer on a cool setting to relieve discomfort.  °Do not trim or cut your cast or remove padding from inside of it.  °Exercise all joints next to the injury that are not immobilized by the cast or splint. For example, if you have a long leg cast, exercise the hip joint and toes. If you have an arm cast or splint, exercise the shoulder, elbow, thumb, and fingers.  °Elevate your injured arm or leg on 1 or 2 pillows for the first 1 to 3 days to decrease swelling and  pain. It is best if you can comfortably elevate your cast so it is higher than your heart. °SEEK MEDICAL CARE IF:  °Your cast or splint cracks.  °Your cast or splint is too tight or too loose.  °You have unbearable itching inside the cast.  °Your cast becomes wet or develops a soft spot or area.  °You have a bad smell coming from inside your cast.  °You get an object stuck under your cast.  °Your skin around the cast becomes red or raw.  °You have new pain or worsening pain after the cast has been applied. °SEEK IMMEDIATE MEDICAL CARE IF:  °You have fluid leaking through the cast.  °You are unable to move your fingers or toes.  °You have discolored (blue or white), cool, painful, or very swollen fingers or toes beyond the cast.  °You have tingling or numbness around the injured area.  °You have severe pain or pressure under the cast.  °You have any difficulty with your breathing or have shortness of breath.  °You have chest pain. °This information is not intended to replace advice given to you by your health care provider. Make sure you discuss any questions you have with your health care provider.  °  Document Released: 03/17/2000 Document Revised: 01/08/2013 Document Reviewed: 09/26/2012  °Elsevier Interactive Patient Education ©2016 [employer]  ° °

## 2015-10-11 DIAGNOSIS — S92355A Nondisplaced fracture of fifth metatarsal bone, left foot, initial encounter for closed fracture: Secondary | ICD-10-CM | POA: Diagnosis not present

## 2015-10-11 DIAGNOSIS — E1142 Type 2 diabetes mellitus with diabetic polyneuropathy: Secondary | ICD-10-CM | POA: Diagnosis not present

## 2015-10-15 ENCOUNTER — Other Ambulatory Visit: Payer: Self-pay

## 2015-10-25 ENCOUNTER — Ambulatory Visit: Payer: Self-pay | Admitting: Family Medicine

## 2015-10-28 DIAGNOSIS — S92355D Nondisplaced fracture of fifth metatarsal bone, left foot, subsequent encounter for fracture with routine healing: Secondary | ICD-10-CM | POA: Diagnosis not present

## 2015-10-28 DIAGNOSIS — M79672 Pain in left foot: Secondary | ICD-10-CM | POA: Diagnosis not present

## 2015-10-28 DIAGNOSIS — E1142 Type 2 diabetes mellitus with diabetic polyneuropathy: Secondary | ICD-10-CM | POA: Diagnosis not present

## 2015-10-28 DIAGNOSIS — B351 Tinea unguium: Secondary | ICD-10-CM | POA: Diagnosis not present

## 2015-11-01 ENCOUNTER — Ambulatory Visit: Payer: Self-pay | Admitting: Family Medicine

## 2015-11-02 ENCOUNTER — Ambulatory Visit (INDEPENDENT_AMBULATORY_CARE_PROVIDER_SITE_OTHER): Payer: PPO | Admitting: Family Medicine

## 2015-11-02 ENCOUNTER — Encounter: Payer: Self-pay | Admitting: Family Medicine

## 2015-11-02 VITALS — BP 120/64 | HR 72 | Ht 62.0 in | Wt 145.0 lb

## 2015-11-02 DIAGNOSIS — L509 Urticaria, unspecified: Secondary | ICD-10-CM

## 2015-11-02 DIAGNOSIS — E109 Type 1 diabetes mellitus without complications: Secondary | ICD-10-CM | POA: Diagnosis not present

## 2015-11-02 DIAGNOSIS — I10 Essential (primary) hypertension: Secondary | ICD-10-CM

## 2015-11-02 LAB — GLUCOSE, POCT (MANUAL RESULT ENTRY): POC GLUCOSE: 198 mg/dL — AB (ref 70–99)

## 2015-11-02 MED ORDER — METFORMIN HCL 500 MG PO TABS: 500.0000 mg | ORAL_TABLET | Freq: Two times a day (BID) | ORAL | Status: DC

## 2015-11-02 NOTE — Progress Notes (Signed)
Name: Melinda Ford   MRN: [id number]    DOB: [birth year]   Date:11/02/2015       Progress Note  Subjective  Chief Complaint  Chief Complaint  Patient presents with  . Diabetes    Diabetes  She presents for her follow-up diabetic visit. She has type 2 diabetes mellitus. No MedicAlert identification noted. Her disease course has been stable. Pertinent negatives for hypoglycemia include no dizziness, headaches or nervousness/anxiousness. Associated symptoms include polyuria. Pertinent negatives for diabetes include no blurred vision, no chest pain, no fatigue, no foot paresthesias, no foot ulcerations, no polydipsia, no polyphagia, no visual change, no weakness and no weight loss. Risk factors for coronary artery disease include dyslipidemia. Current diabetic treatment includes oral agent (monotherapy) and insulin injections. She is compliant with treatment most of the time. Her weight is stable. She is following a generally healthy diet. She participates in exercise intermittently. Her home blood glucose trend is fluctuating minimally. Her breakfast blood glucose is taken between 8-9 am. Her breakfast blood glucose range is generally 140-180 mg/dl. An ACE inhibitor/angiotensin II receptor blocker is being taken. She sees a podiatrist.Eye exam is not current.    No problem-specific Assessment & Plan notes found for this encounter.   Past Medical History:  Diagnosis Date  . Allergy   . Arthritis   . Cataract   . Diabetes mellitus without complication ([place])   . Hypertension   . Osteoporosis     Past Surgical History:  Procedure Laterality Date  . ABDOMINAL HYSTERECTOMY      Family History  Problem Relation Age of Onset  . Breast cancer Other 40  . Alcohol abuse Father   . Mental illness Father   . Stroke Father   . Diabetes Sister   . Asthma Brother   . Diabetes Brother   . Diabetes Maternal Aunt   . Heart disease Maternal Aunt   . Diabetes Maternal Uncle   . Early death  Maternal Uncle   . Breast cancer Cousin 64    Social History   Social History  . Marital status: Married    Spouse name: N/A  . Number of children: N/A  . Years of education: N/A   Occupational History  . Not on file.   Social History Main Topics  . Smoking status: Never Smoker  . Smokeless tobacco: Never Used  . Alcohol use 0.6 oz/week    1 Standard drinks or equivalent per week  . Drug use: No  . Sexual activity: No   Other Topics Concern  . Not on file   Social History Narrative  . No narrative on file    No Known Allergies   Review of Systems  Constitutional: Negative for chills, fatigue, fever, malaise/fatigue and weight loss.  HENT: Negative for ear discharge, ear pain and sore throat.   Eyes: Negative for blurred vision.  Respiratory: Negative for cough, sputum production, shortness of breath and wheezing.   Cardiovascular: Negative for chest pain, palpitations and leg swelling.  Gastrointestinal: Negative for abdominal pain, blood in stool, constipation, diarrhea, heartburn, melena and nausea.  Genitourinary: Negative for dysuria, frequency, hematuria and urgency.  Musculoskeletal: Negative for back pain, joint pain, myalgias and neck pain.  Skin: Positive for itching. Negative for rash.  Neurological: Negative for dizziness, tingling, sensory change, focal weakness, weakness and headaches.  Endo/Heme/Allergies: Negative for environmental allergies, polydipsia and polyphagia. Does not bruise/bleed easily.  Psychiatric/Behavioral: Negative for depression and suicidal ideas. The patient is not nervous/anxious and does  not have insomnia.      Objective  Vitals:   11/02/15 0805  BP: 120/64  Pulse: 72  Weight: 145 lb (65.8 kg)  Height: 5\' 2"  (1.575 m)    Physical Exam  Constitutional: She is well-developed, well-nourished, and in no distress. No distress.  HENT:  Head: Normocephalic and atraumatic.  Right Ear: External ear normal.  Left Ear: External  ear normal.  Nose: Nose normal.  Mouth/Throat: Oropharynx is clear and moist.  Eyes: Conjunctivae and EOM are normal. Pupils are equal, round, and reactive to light. Right eye exhibits no discharge. Left eye exhibits no discharge.  Neck: Normal range of motion. Neck supple. No JVD present. No thyromegaly present.  Cardiovascular: Normal rate, regular rhythm, normal heart sounds and intact distal pulses.  Exam reveals no gallop and no friction rub.   No murmur heard. Pulmonary/Chest: Effort normal and breath sounds normal.  Abdominal: Soft. Bowel sounds are normal. She exhibits no mass. There is no tenderness. There is no guarding.  Musculoskeletal: Normal range of motion. She exhibits no edema.  Lymphadenopathy:    She has no cervical adenopathy.  Neurological: She is alert. She has normal reflexes.  Skin: Skin is warm and dry. She is not diaphoretic.  Psychiatric: Mood and affect normal.  Nursing note and vitals reviewed.     Assessment & Plan  Problem List Items Addressed This Visit      Cardiovascular and Mediastinum   Benign essential HTN    Other Visit Diagnoses    Type 1 diabetes mellitus without complication ([place])    -  Primary   Relevant Medications   metFORMIN (GLUCOPHAGE) 500 MG tablet   Other Relevant Orders   POCT Glucose (CBG) (Completed)   Hemoglobin A1c   Urticaria            Dr. Otilio Miu [facility]  11/02/15  Pt brought by a form from [place] for assistance with Insulin- insulin "has to be delivered to physician's office"- pt was told we could not be resposible for her meds

## 2015-11-03 ENCOUNTER — Other Ambulatory Visit: Payer: Self-pay

## 2015-11-03 DIAGNOSIS — E109 Type 1 diabetes mellitus without complications: Secondary | ICD-10-CM

## 2015-11-03 LAB — HEMOGLOBIN A1C
Est. average glucose Bld gHb Est-mCnc: 220 mg/dL
Hgb A1c MFr Bld: 9.3 % — ABNORMAL HIGH (ref 4.8–5.6)

## 2015-11-10 DIAGNOSIS — E1140 Type 2 diabetes mellitus with diabetic neuropathy, unspecified: Secondary | ICD-10-CM | POA: Diagnosis not present

## 2015-11-10 DIAGNOSIS — G629 Polyneuropathy, unspecified: Secondary | ICD-10-CM | POA: Diagnosis not present

## 2015-11-12 ENCOUNTER — Other Ambulatory Visit: Payer: Self-pay

## 2015-11-12 DIAGNOSIS — E109 Type 1 diabetes mellitus without complications: Secondary | ICD-10-CM

## 2015-11-12 MED ORDER — METFORMIN HCL 500 MG PO TABS: 500.0000 mg | ORAL_TABLET | Freq: Two times a day (BID) | ORAL | 0 refills | Status: DC

## 2015-11-18 DIAGNOSIS — S92355D Nondisplaced fracture of fifth metatarsal bone, left foot, subsequent encounter for fracture with routine healing: Secondary | ICD-10-CM | POA: Diagnosis not present

## 2015-11-18 DIAGNOSIS — M79672 Pain in left foot: Secondary | ICD-10-CM | POA: Diagnosis not present

## 2015-11-25 ENCOUNTER — Other Ambulatory Visit: Payer: Self-pay

## 2015-11-25 DIAGNOSIS — E119 Type 2 diabetes mellitus without complications: Secondary | ICD-10-CM

## 2015-11-29 ENCOUNTER — Encounter: Payer: Self-pay | Admitting: Family Medicine

## 2015-11-29 ENCOUNTER — Ambulatory Visit
Admission: RE | Admit: 2015-11-29 | Discharge: 2015-11-29 | Disposition: A | Discharge status: Home / Self Care | Payer: PPO | Source: Ambulatory Visit | Attending: Family Medicine | Admitting: Family Medicine

## 2015-11-29 ENCOUNTER — Ambulatory Visit (INDEPENDENT_AMBULATORY_CARE_PROVIDER_SITE_OTHER): Payer: PPO | Admitting: Family Medicine

## 2015-11-29 VITALS — BP 130/68 | HR 64 | Ht 62.0 in | Wt 145.0 lb

## 2015-11-29 DIAGNOSIS — S76911A Strain of unspecified muscles, fascia and tendons at thigh level, right thigh, initial encounter: Secondary | ICD-10-CM

## 2015-11-29 DIAGNOSIS — S76111A Strain of right quadriceps muscle, fascia and tendon, initial encounter: Secondary | ICD-10-CM

## 2015-11-29 DIAGNOSIS — M79651 Pain in right thigh: Secondary | ICD-10-CM | POA: Insufficient documentation

## 2015-11-29 DIAGNOSIS — M79661 Pain in right lower leg: Secondary | ICD-10-CM | POA: Diagnosis not present

## 2015-11-29 MED ORDER — MELOXICAM 15 MG PO TABS: 15.0000 mg | ORAL_TABLET | Freq: Every day | ORAL | 0 refills | Status: DC

## 2015-11-29 NOTE — Progress Notes (Signed)
Name: Melinda Ford   MRN: [id number]    DOB: 12/24/1945   Date:11/29/2015       Progress Note  Subjective  Chief Complaint  Chief Complaint  Patient presents with  . Leg Pain    thigh pain radiating up R) leg to hip- took 2 Tylenol and Equate and soaked in warm water to get pain relief    Leg Pain   The incident occurred 3 to 5 days ago. The incident occurred at home (played tennis). There was no injury mechanism. The pain is present in the right leg and right thigh. The pain is at a severity of 5/10. The pain is moderate. The pain has been improving since onset. Associated symptoms include an inability to bear weight. Pertinent negatives include no loss of sensation, muscle weakness, numbness or tingling. Nothing aggravates the symptoms. She has tried acetaminophen for the symptoms. The treatment provided no relief.    No problem-specific Assessment & Plan notes found for this encounter.   Past Medical History:  Diagnosis Date  . Allergy   . Arthritis   . Cataract   . Diabetes mellitus without complication ([place])   . Hypertension   . Osteoporosis     Past Surgical History:  Procedure Laterality Date  . ABDOMINAL HYSTERECTOMY      Family History  Problem Relation Age of Onset  . Breast cancer Other 40  . Alcohol abuse Father   . Mental illness Father   . Stroke Father   . Diabetes Sister   . Asthma Brother   . Diabetes Brother   . Diabetes Maternal Aunt   . Heart disease Maternal Aunt   . Diabetes Maternal Uncle   . Early death Maternal Uncle   . Breast cancer Cousin 48    Social History   Social History  . Marital status: Married    Spouse name: N/A  . Number of children: N/A  . Years of education: N/A   Occupational History  . Not on file.   Social History Main Topics  . Smoking status: Never Smoker  . Smokeless tobacco: Never Used  . Alcohol use 0.6 oz/week    1 Standard drinks or equivalent per week  . Drug use: No  . Sexual activity: No    Other Topics Concern  . Not on file   Social History Narrative  . No narrative on file    No Known Allergies   Review of Systems  Constitutional: Negative for chills, fever, malaise/fatigue and weight loss.  HENT: Negative for ear discharge, ear pain and sore throat.   Eyes: Negative for blurred vision.  Respiratory: Negative for cough, sputum production, shortness of breath and wheezing.   Cardiovascular: Negative for chest pain, palpitations and leg swelling.  Gastrointestinal: Negative for abdominal pain, blood in stool, constipation, diarrhea, heartburn, melena and nausea.  Genitourinary: Negative for dysuria, frequency, hematuria and urgency.  Musculoskeletal: Negative for back pain, joint pain, myalgias and neck pain.  Skin: Negative for rash.  Neurological: Negative for dizziness, tingling, sensory change, focal weakness, numbness and headaches.  Endo/Heme/Allergies: Negative for environmental allergies and polydipsia. Does not bruise/bleed easily.  Psychiatric/Behavioral: Negative for depression and suicidal ideas. The patient is not nervous/anxious and does not have insomnia.      Objective  Vitals:   11/29/15 0948  BP: 130/68  Pulse: 64  Weight: 145 lb (65.8 kg)  Height: 5\' 2"  (1.575 m)    Physical Exam  Constitutional: She is well-developed, well-nourished, and in  no distress. No distress.  HENT:  Head: Normocephalic and atraumatic.  Right Ear: External ear normal.  Left Ear: External ear normal.  Nose: Nose normal.  Mouth/Throat: Oropharynx is clear and moist.  Eyes: Conjunctivae and EOM are normal. Pupils are equal, round, and reactive to light. Right eye exhibits no discharge. Left eye exhibits no discharge.  Neck: Normal range of motion. Neck supple. No JVD present. No thyromegaly present.  Cardiovascular: Normal rate, regular rhythm, normal heart sounds and intact distal pulses.  Exam reveals no gallop and no friction rub.   No murmur  heard. Pulmonary/Chest: Effort normal and breath sounds normal.  Abdominal: Soft. Bowel sounds are normal. She exhibits no mass. There is no tenderness. There is no guarding.  Musculoskeletal: Normal range of motion. She exhibits no edema.  Lymphadenopathy:    She has no cervical adenopathy.  Neurological: She is alert. She has normal reflexes.  Skin: Skin is warm and dry. She is not diaphoretic.  Psychiatric: Mood and affect normal.  Nursing note and vitals reviewed.     Assessment & Plan  Problem List Items Addressed This Visit    None    Visit Diagnoses    Quadriceps strain, right, initial encounter    -  Primary   Relevant Orders   DG FEMUR, MIN 2 VIEWS RIGHT (Completed)   Ambulatory referral to Physical Therapy   Strain of right iliopsoas muscle, initial encounter       Relevant Orders   DG FEMUR, MIN 2 VIEWS RIGHT (Completed)   Ambulatory referral to Physical Therapy        Dr. Otilio Miu [facility]  11/29/15

## 2015-11-30 ENCOUNTER — Emergency Department
Admission: EM | Admit: 2015-11-30 | Discharge: 2015-11-30 | Disposition: A | Discharge status: Home / Self Care | Payer: PPO | Attending: Emergency Medicine | Admitting: Emergency Medicine

## 2015-11-30 ENCOUNTER — Encounter: Payer: Self-pay | Admitting: Emergency Medicine

## 2015-11-30 ENCOUNTER — Ambulatory Visit: Payer: PPO | Admitting: Physical Therapy

## 2015-11-30 DIAGNOSIS — Z791 Long term (current) use of non-steroidal anti-inflammatories (NSAID): Secondary | ICD-10-CM | POA: Diagnosis not present

## 2015-11-30 DIAGNOSIS — I10 Essential (primary) hypertension: Secondary | ICD-10-CM | POA: Insufficient documentation

## 2015-11-30 DIAGNOSIS — Z79899 Other long term (current) drug therapy: Secondary | ICD-10-CM | POA: Diagnosis not present

## 2015-11-30 DIAGNOSIS — E119 Type 2 diabetes mellitus without complications: Secondary | ICD-10-CM | POA: Diagnosis not present

## 2015-11-30 DIAGNOSIS — Z7984 Long term (current) use of oral hypoglycemic drugs: Secondary | ICD-10-CM | POA: Insufficient documentation

## 2015-11-30 DIAGNOSIS — Z794 Long term (current) use of insulin: Secondary | ICD-10-CM | POA: Diagnosis not present

## 2015-11-30 DIAGNOSIS — Z7982 Long term (current) use of aspirin: Secondary | ICD-10-CM | POA: Diagnosis not present

## 2015-11-30 DIAGNOSIS — M79604 Pain in right leg: Secondary | ICD-10-CM

## 2015-11-30 MED ORDER — TRAMADOL HCL 50 MG PO TABS
50.0000 mg | ORAL_TABLET | Freq: Once | ORAL | Status: AC
  Administered 2015-11-30: 50 mg via ORAL
  Filled 2015-11-30: qty 1

## 2015-11-30 MED ORDER — TRAMADOL HCL 50 MG PO TABS: 50.0000 mg | ORAL_TABLET | Freq: Two times a day (BID) | ORAL | 0 refills | Status: DC | PRN

## 2015-11-30 NOTE — ED Provider Notes (Signed)
[facility] Emergency Department Provider Note   ____________________________________________   None    (approximate)  I have reviewed the triage vital signs and the nursing notes.   HISTORY  Chief Complaint Leg Pain    HPI Melinda Ford is a 70 y.o. female complaining of right leg pain. 3 days. Patient stated onset was after playing tennis 3 days ago. Patient had x-ray of her femur yesterday which shows mild osteopenia. Patient states she contacted her doctor today and he is told her to come to the emergency room for further care. Patient state this is the worse  experienced.Patient recently recovered from a fifth metatarsal fracture of the right foot. Patient rates the pain as 8/10. No palliative measures taken.   Past Medical History:  Diagnosis Date  . Allergy   . Arthritis   . Cataract   . Diabetes mellitus without complication ([place])   . Hypertension   . Osteoporosis     Patient Active Problem List   Diagnosis Date Noted  . Overweight (BMI 25.0-29.9) 10/01/2015  . Vitamin D deficiency 09/18/2014  . Onychomycosis 09/16/2014  . Degenerative disc disease, lumbar 09/16/2014  . Benign essential HTN 10/10/2010  . OP (osteoporosis) 10/10/2010  . Type 2 diabetes mellitus, uncontrolled, with neuropathy ([place]) 10/10/2010    Past Surgical History:  Procedure Laterality Date  . ABDOMINAL HYSTERECTOMY      Prior to Admission medications   Medication Sig Start Date End Date Taking? Authorizing Provider  acetaminophen (TYLENOL) 500 MG tablet Take 1,000 mg by mouth 2 (two) times daily.    Historical Provider, MD  amLODipine (NORVASC) 10 MG tablet Take 1 tablet (10 mg total) by mouth daily. 04/14/15 04/13/16  Adline Potter, MD  aspirin 81 MG tablet Take 1 tablet (81 mg total) by mouth daily. 10/01/15   Adline Potter, MD  Blood Glucose Monitoring Suppl (FIFTY50 GLUCOSE METER 2.0) W/DEVICE KIT 1 Units by Other route 3 (three) times daily as needed.  01/20/13   Historical Provider, MD  Cholecalciferol (VITAMIN D) 2000 UNITS CAPS Take 1 capsule (2,000 Units total) by mouth daily. 09/18/14   Adline Potter, MD  GINKGO BILOBA COMPLEX PO Take by mouth.    Historical Provider, MD  glucose blood (GLUCOSE METER TEST) test strip 1 each by Other route 3 (three) times daily. Use for Diabetes Mellitus Type 2 to check Blood Glucose levels up to 3 times daily. 06/10/15   Adline Potter, MD  insulin glargine (LANTUS) 100 UNIT/ML injection Inject 0.3 mLs (30 Units total) into the skin daily. Patient taking differently: Inject 35 Units into the skin daily.  09/13/15 09/12/16  Adline Potter, MD  Insulin Syringe-Needle U-100 (INSULIN SYRINGE .3CC/31GX5/16") 31G X 5/16" 0.3 ML MISC Inject 1 Units as directed 2 (two) times daily. 01/27/15   Adline Potter, MD  losartan (COZAAR) 100 MG tablet Take 1 tablet (100 mg total) by mouth daily. 09/29/15 09/29/16  Adline Potter, MD  meloxicam (MOBIC) 15 MG tablet Take 1 tablet (15 mg total) by mouth daily. 11/29/15   Juline Patch, MD  metFORMIN (GLUCOPHAGE) 500 MG tablet Take 1 tablet (500 mg total) by mouth 2 (two) times daily with a meal. 11/12/15   Juline Patch, MD  ReliOn Ultra Thin Lancets MISC Inject 1 Units as directed 3 (three) times daily as needed. 05/06/14   Historical Provider, MD  traMADol (ULTRAM) 50 MG tablet Take 1 tablet (50 mg total) by mouth every 12 (twelve) hours as needed. 11/30/15 11/29/16  Joni Reining, PA-C    Allergies Review of patient's allergies indicates no known allergies.  Family History  Problem Relation Age of Onset  . Breast cancer Other 40  . Alcohol abuse Father   . Mental illness Father   . Stroke Father   . Diabetes Sister   . Asthma Brother   . Diabetes Brother   . Diabetes Maternal Aunt   . Heart disease Maternal Aunt   . Diabetes Maternal Uncle   . Early death Maternal Uncle   . Breast cancer Cousin 51    Social History Social History  Substance Use Topics  . Smoking status:  Never Smoker  . Smokeless tobacco: Never Used  . Alcohol use 0.6 oz/week    1 Standard drinks or equivalent per week    Review of Systems Constitutional: No fever/chills Eyes: No visual changes. ENT: No sore throat. Cardiovascular: Denies chest pain. Respiratory: Denies shortness of breath. Gastrointestinal: No abdominal pain.  No nausea, no vomiting.  No diarrhea.  No constipation. Genitourinary: Negative for dysuria. Musculoskeletal: Right leg pain  Skin: Negative for rash. Neurological: Negative for headaches, focal weakness or numbness. Endocrine:Diabetes and hypertension ____________________________________________   PHYSICAL EXAM:  VITAL SIGNS: ED Triage Vitals  Enc Vitals Group     BP 11/30/15 1041 (!) 147/64     Pulse Rate 11/30/15 1041 61     Resp 11/30/15 1041 20     Temp 11/30/15 1041 97.7 F (36.5 C)     Temp Source 11/30/15 1041 Oral     SpO2 11/30/15 1041 98 %     Weight 11/30/15 1042 145 lb (65.8 kg)     Height 11/30/15 1042 5\' 2"  (1.575 m)     Head Circumference --      Peak Flow --      Pain Score 11/30/15 1042 8     Pain Loc --      Pain Edu? --      Excl. in GC? --     Constitutional: Alert and oriented. Well appearing and in no acute distress. Eyes: Conjunctivae are normal. PERRL. EOMI. Head: Atraumatic. Nose: No congestion/rhinnorhea. Mouth/Throat: Mucous membranes are moist.  Oropharynx non-erythematous. Neck: No stridor.  No cervical spine tenderness to palpation. Hematological/Lymphatic/Immunilogical: No cervical lymphadenopathy. Cardiovascular: Normal rate, regular rhythm. Grossly normal heart sounds.  Good peripheral circulation. Respiratory: Normal respiratory effort.  No retractions. Lungs CTAB. Gastrointestinal: Soft and nontender. No distention. No abdominal bruits. No CVA tenderness. Musculoskeletal: No lower extremity tenderness nor edema.  No joint effusions. Neurologic:  Normal speech and language. No gross focal neurologic  deficits are appreciated. No gait instability. Skin:  Skin is warm, dry and intact. No rash noted. Psychiatric: Mood and affect are normal. Speech and behavior are normal.  ____________________________________________   LABS (all labs ordered are listed, but only abnormal results are displayed)  Labs Reviewed - No data to display ____________________________________________  EKG   ____________________________________________  RADIOLOGY  Reviewed x-ray from yesterday revealing no acute findings.mild osteopenic findings. ____________________________________   PROCEDURES  Procedure(s) performed: None  Procedures  Critical Care performed: No  ____________________________________________   INITIAL IMPRESSION / ASSESSMENT AND PLAN / ED COURSE  Pertinent labs & imaging results that were available during my care of the patient were reviewed by me and considered in my medical decision making (see chart for details).  Left leg pain. Discussed x-ray finding with patient. Patient given discharge Instructions. Patient advised follow-up family doctor to consider bone density and bone scan evaluation.  Clinical Course     ____________________________________________   FINAL CLINICAL IMPRESSION(S) / ED DIAGNOSES  Final diagnoses:  Right leg pain      NEW MEDICATIONS STARTED DURING THIS VISIT:  New Prescriptions   TRAMADOL (ULTRAM) 50 MG TABLET    Take 1 tablet (50 mg total) by mouth every 12 (twelve) hours as needed.     Note:  This document was prepared using Dragon voice recognition software and may include unintentional dictation errors.    Sable Feil, PA-C 11/30/15 1133    Schuyler Amor, MD 11/30/15 1501

## 2015-11-30 NOTE — ED Triage Notes (Signed)
Pt presents with severe pain to right lower leg. Pt states she had xray yesterday and there was no abnormality. Pt states the MD states she has osteopenia. Pt states this is the worst pain she has ever had. NAD Noted.

## 2015-11-30 NOTE — Discharge Instructions (Signed)
Follow-up family doctor consider bone density and bone scan evaluation. Advised to start woman's One-A-Day multivitamins with calcium until further evaluation by her family doctor.

## 2015-12-01 ENCOUNTER — Emergency Department
Admission: EM | Admit: 2015-12-01 | Discharge: 2015-12-01 | Disposition: A | Discharge status: Home / Self Care | Payer: PPO | Attending: Emergency Medicine | Admitting: Emergency Medicine

## 2015-12-01 ENCOUNTER — Emergency Department: Payer: PPO

## 2015-12-01 ENCOUNTER — Encounter: Payer: Self-pay | Admitting: Emergency Medicine

## 2015-12-01 DIAGNOSIS — M5431 Sciatica, right side: Secondary | ICD-10-CM | POA: Diagnosis not present

## 2015-12-01 DIAGNOSIS — M5441 Lumbago with sciatica, right side: Secondary | ICD-10-CM | POA: Insufficient documentation

## 2015-12-01 DIAGNOSIS — Z7984 Long term (current) use of oral hypoglycemic drugs: Secondary | ICD-10-CM | POA: Insufficient documentation

## 2015-12-01 DIAGNOSIS — Z794 Long term (current) use of insulin: Secondary | ICD-10-CM | POA: Insufficient documentation

## 2015-12-01 DIAGNOSIS — M545 Low back pain: Secondary | ICD-10-CM | POA: Diagnosis not present

## 2015-12-01 DIAGNOSIS — E119 Type 2 diabetes mellitus without complications: Secondary | ICD-10-CM | POA: Diagnosis not present

## 2015-12-01 DIAGNOSIS — M79661 Pain in right lower leg: Secondary | ICD-10-CM | POA: Diagnosis not present

## 2015-12-01 DIAGNOSIS — I10 Essential (primary) hypertension: Secondary | ICD-10-CM | POA: Insufficient documentation

## 2015-12-01 DIAGNOSIS — Z791 Long term (current) use of non-steroidal anti-inflammatories (NSAID): Secondary | ICD-10-CM | POA: Insufficient documentation

## 2015-12-01 DIAGNOSIS — Z79899 Other long term (current) drug therapy: Secondary | ICD-10-CM | POA: Diagnosis not present

## 2015-12-01 DIAGNOSIS — Z7982 Long term (current) use of aspirin: Secondary | ICD-10-CM | POA: Diagnosis not present

## 2015-12-01 DIAGNOSIS — M25551 Pain in right hip: Secondary | ICD-10-CM | POA: Diagnosis not present

## 2015-12-01 MED ORDER — ONDANSETRON 4 MG PO TBDP
4.0000 mg | ORAL_TABLET | Freq: Once | ORAL | Status: AC
  Administered 2015-12-01: 4 mg via ORAL
  Filled 2015-12-01: qty 1

## 2015-12-01 MED ORDER — HYDROCODONE-ACETAMINOPHEN 5-325 MG PO TABS
1.0000 | ORAL_TABLET | Freq: Once | ORAL | Status: AC
  Administered 2015-12-01: 1 via ORAL
  Filled 2015-12-01: qty 1

## 2015-12-01 MED ORDER — PREDNISONE 10 MG PO TABS: ORAL_TABLET | ORAL | 0 refills | Status: DC

## 2015-12-01 MED ORDER — DIAZEPAM 5 MG PO TABS
5.0000 mg | ORAL_TABLET | Freq: Once | ORAL | Status: AC
  Administered 2015-12-01: 5 mg via ORAL
  Filled 2015-12-01: qty 1

## 2015-12-01 MED ORDER — METHYLPREDNISOLONE SODIUM SUCC 125 MG IJ SOLR
80.0000 mg | Freq: Once | INTRAMUSCULAR | Status: AC
  Administered 2015-12-01: 80 mg via INTRAMUSCULAR
  Filled 2015-12-01: qty 2

## 2015-12-01 MED ORDER — DIAZEPAM 2 MG PO TABS: 2.0000 mg | ORAL_TABLET | Freq: Three times a day (TID) | ORAL | 0 refills | Status: AC | PRN

## 2015-12-01 NOTE — Discharge Instructions (Signed)
Please complete light range of motion and stretching exercises as tolerated.   Follow up with you PCP or Orthopedics if not improving in 2 days.

## 2015-12-01 NOTE — ED Notes (Signed)
states she played tennis on Saturday  Then developed pain to right leg  Pain moved up into right groin area   Unable to bear wt .  Was seen yesterday but states pain is not any better

## 2015-12-01 NOTE — ED Triage Notes (Signed)
Pt presents today with right leg pain, aching and throbbing. Was seen here yesterday for same and pain has not been relieved. No known injury.

## 2015-12-01 NOTE — ED Triage Notes (Signed)
Pt states the tramadol did not help with the pain.

## 2015-12-01 NOTE — ED Provider Notes (Signed)
[facility] Emergency Department Provider Note  ____________________________________________  Time seen: Approximately 10:43 AM  I have reviewed the triage vital signs and the nursing notes.   HISTORY  Chief Complaint Leg Pain (right leg)    HPI Melinda Ford is a 70 y.o. female , NAD, presents to the emergency department with continued right lower extremity pain. States she has had pain in the lower back, right hip and right thigh over the last few days. Denies any specific injury, trauma or fall. States she did play tennis prior to the pain beginning but did not specifically injure herself during that time. Was seen by her primary care provider who completed a femur x-ray which was negative. Was seen in this emergency department yesterday and given tramadol in which the patient states did not help with the pain. She states she has not been able to get comfortable in a seated or lying position due to the pain. Notes that the pain seems to radiate from the right hip into the thigh and can even go into the lower leg. Denies any saddle paresthesias, numbness, weakness, tingling. Has had no loss of bowel or bladder control. Has also noted that her blood pressures and elevated home in which she relates to pain. Has not had any chest pain, shortness of breath, abdominal pain, nausea, vomiting. Denies any headaches, visual changes, neck pain. Has not noted any rashes, skin sores, swelling, redness or bruising. Has not taken anything over-the-counter for her pain at this time.   Past Medical History:  Diagnosis Date  . Allergy   . Arthritis   . Cataract   . Diabetes mellitus without complication ([place])   . Hypertension   . Osteoporosis     Patient Active Problem List   Diagnosis Date Noted  . Overweight (BMI 25.0-29.9) 10/01/2015  . Vitamin D deficiency 09/18/2014  . Onychomycosis 09/16/2014  . Degenerative disc disease, lumbar 09/16/2014  . Benign essential HTN  10/10/2010  . OP (osteoporosis) 10/10/2010  . Type 2 diabetes mellitus, uncontrolled, with neuropathy ([place]) 10/10/2010    Past Surgical History:  Procedure Laterality Date  . ABDOMINAL HYSTERECTOMY      Prior to Admission medications   Medication Sig Start Date End Date Taking? Authorizing Provider  acetaminophen (TYLENOL) 500 MG tablet Take 1,000 mg by mouth 2 (two) times daily.    Historical Provider, MD  amLODipine (NORVASC) 10 MG tablet Take 1 tablet (10 mg total) by mouth daily. 04/14/15 04/13/16  Adline Potter, MD  aspirin 81 MG tablet Take 1 tablet (81 mg total) by mouth daily. 10/01/15   Adline Potter, MD  Blood Glucose Monitoring Suppl (FIFTY50 GLUCOSE METER 2.0) W/DEVICE KIT 1 Units by Other route 3 (three) times daily as needed. 01/20/13   Historical Provider, MD  Cholecalciferol (VITAMIN D) 2000 UNITS CAPS Take 1 capsule (2,000 Units total) by mouth daily. 09/18/14   Adline Potter, MD  diazepam (VALIUM) 2 MG tablet Take 1 tablet (2 mg total) by mouth every 8 (eight) hours as needed for muscle spasms. 12/01/15 12/08/15  Elliyah Liszewski L Wasyl Dornfeld, PA-C  GINKGO BILOBA COMPLEX PO Take by mouth.    Historical Provider, MD  glucose blood (GLUCOSE METER TEST) test strip 1 each by Other route 3 (three) times daily. Use for Diabetes Mellitus Type 2 to check Blood Glucose levels up to 3 times daily. 06/10/15   Adline Potter, MD  insulin glargine (LANTUS) 100 UNIT/ML injection Inject 0.3 mLs (30 Units total) into the skin daily.  Patient taking differently: Inject 35 Units into the skin daily.  09/13/15 09/12/16  Adline Potter, MD  Insulin Syringe-Needle U-100 (INSULIN SYRINGE .3CC/31GX5/16") 31G X 5/16" 0.3 ML MISC Inject 1 Units as directed 2 (two) times daily. 01/27/15   Adline Potter, MD  losartan (COZAAR) 100 MG tablet Take 1 tablet (100 mg total) by mouth daily. 09/29/15 09/29/16  Adline Potter, MD  meloxicam (MOBIC) 15 MG tablet Take 1 tablet (15 mg total) by mouth daily. 11/29/15   Juline Patch, MD   metFORMIN (GLUCOPHAGE) 500 MG tablet Take 1 tablet (500 mg total) by mouth 2 (two) times daily with a meal. 11/12/15   Juline Patch, MD  predniSONE (DELTASONE) 10 MG tablet Take a daily regimen of 6,5,4,3,2,1 12/01/15   Micaiah Litle L Kailash Hinze, PA-C  ReliOn Ultra Thin Lancets MISC Inject 1 Units as directed 3 (three) times daily as needed. 05/06/14   Historical Provider, MD  traMADol (ULTRAM) 50 MG tablet Take 1 tablet (50 mg total) by mouth every 12 (twelve) hours as needed. 11/30/15 11/29/16  Sable Feil, PA-C    Allergies Review of patient's allergies indicates no known allergies.  Family History  Problem Relation Age of Onset  . Breast cancer Other 40  . Alcohol abuse Father   . Mental illness Father   . Stroke Father   . Diabetes Sister   . Asthma Brother   . Diabetes Brother   . Diabetes Maternal Aunt   . Heart disease Maternal Aunt   . Diabetes Maternal Uncle   . Early death Maternal Uncle   . Breast cancer Cousin 52    Social History Social History  Substance Use Topics  . Smoking status: Never Smoker  . Smokeless tobacco: Never Used  . Alcohol use 0.6 oz/week    1 Standard drinks or equivalent per week     Review of Systems Constitutional: No fever/chills, fatigue Cardiovascular: No chest pain. Respiratory: No shortness of breath. No wheezing.  Gastrointestinal: No abdominal pain.  No nausea, vomiting.  No diarrhea, constipation. Genitourinary: Negative for dysuria, hematuria. No urinary hesitancy, urgency or increased frequency. Musculoskeletal: Positive right lower back pain, hip pain, thigh pain. Pain can radiate down into the lower leg.  Skin: Negative for rash, redness, swelling, skin sores. Neurological: Negative for headaches, focal weakness or numbness. No saddle paresthesias nor loss of bowel or bladder control. 10-point ROS otherwise negative.  ____________________________________________   PHYSICAL EXAM:  VITAL SIGNS: ED Triage Vitals [12/01/15 0955]   Enc Vitals Group     BP (!) 194/67     Pulse Rate 74     Resp 18     Temp 98.1 F (36.7 C)     Temp Source Oral     SpO2 97 %     Weight      Height      Head Circumference      Peak Flow      Pain Score 10     Pain Loc      Pain Edu?      Excl. in [place]?      Constitutional: Alert and oriented. Well appearing and in no acute distress.Patient is squirming due to pain and right lower extremity. Eyes: Conjunctivae are normal. PERRL. EOMI without pain.  Head: Atraumatic. Cardiovascular: Normal rate, regular rhythm. Normal S1 and S2.  Good peripheral circulationWith 2+ pulses noted in bilateral lower extremities. Respiratory: Normal respiratory effort without tachypnea or retractions. Lungs CTAB with breath sounds noted in all  lung fields. Musculoskeletal: No tenderness to deep palpation of the right hip, inguinal area, femur, knee, lower leg. Full range of motion of the right hip, knee, ankle and foot without pain or discomfort. No specific muscle spasm noted about the right thigh. Right SI joint tenderness with deep palpation. No lumbar spinal tenderness to palpation nor step-offs. No lower extremity tenderness nor edema.  No joint effusions. Neurologic:  Normal speech and language. No gross focal neurologic deficits are appreciated. Sensation to light touch grossly intact by bilateral lower extremities Skin:  Skin is warm, dry and intact. No rash, Redness, swelling, skin sores noted. Psychiatric: Mood and affect are normal. Speech and behavior are normal. Patient exhibits appropriate insight and judgement.   ____________________________________________   LABS  None ____________________________________________  EKG  None ____________________________________________  RADIOLOGY I have personally viewed and evaluated these images (plain radiographs) as part of my medical decision making, as well as reviewing the written report by the radiologist.  Dg Lumbar Spine 2-3  Views  Result Date: 12/01/2015 CLINICAL DATA:  Lumbago after playing tennis EXAM: LUMBAR SPINE - 2-3 VIEW COMPARISON:  None. FINDINGS: Frontal, lateral, and spot lumbosacral lateral images were obtained. There are 5 non-rib-bearing lumbar type vertebral bodies. There is mild thoracolumbar levoscoliosis. There is no fracture or spondylolisthesis. There is slight disc space narrowing at L4-5. Other disc spaces appear normal. No erosive change. IMPRESSION: Slight disc space narrowing at L4-5. Other disc spaces appear normal. Slight scoliosis present. No fracture or spondylolisthesis. Electronically Signed   By: Lowella Grip III M.D.   On: 12/01/2015 11:10   US Venous Img Lower Unilateral Right  Result Date: 12/01/2015 CLINICAL DATA:  Inguinal pain radiating to lower leg x3 days. EXAM: RIGHT LOWER EXTREMITY VENOUS DOPPLER ULTRASOUND TECHNIQUE: Gray-scale sonography with graded compression, as well as color Doppler and duplex ultrasound, were performed to evaluate the deep venous system from the level of the common femoral vein through the popliteal and proximal calf veins. Spectral Doppler was utilized to evaluate flow at rest and with distal augmentation maneuvers. COMPARISON:  None. FINDINGS: Thrombus within deep veins:  None visualized. Compressibility of deep veins:  Normal. Duplex waveform respiratory phasicity:  Normal. Duplex waveform response to augmentation:  Normal. Venous reflux:  None visualized. Other findings: No evidence of Baker's cyst. Survey views of the contralateral common femoral vein are unremarkable. IMPRESSION: 1. Negative for right lower extremity DVT. Electronically Signed   By: Lucrezia Europe M.D.   On: 12/01/2015 11:54   Dg Hip Unilat With Pelvis 2-3 Views Right  Result Date: 12/01/2015 CLINICAL DATA:  Pain after playing tennis EXAM: DG HIP (WITH OR WITHOUT PELVIS) 2-3V RIGHT COMPARISON:  None. FINDINGS: Frontal pelvis as well as frontal and lateral right hip images were obtained.  There is no demonstrable fracture or dislocation. The joint spaces appear normal. No erosive change. IMPRESSION: No fracture or dislocation.  No apparent arthropathic change. Electronically Signed   By: Lowella Grip III M.D.   On: 12/01/2015 11:11    ____________________________________________    PROCEDURES  Procedure(s) performed: None   Procedures   Medications  HYDROcodone-acetaminophen (NORCO/VICODIN) 5-325 MG per tablet 1 tablet (1 tablet Oral Given 12/01/15 1103)  ondansetron (ZOFRAN-ODT) disintegrating tablet 4 mg (4 mg Oral Given 12/01/15 1103)  methylPREDNISolone sodium succinate (SOLU-MEDROL) 125 mg/2 mL injection 80 mg (80 mg Intramuscular Given 12/01/15 1233)  diazepam (VALIUM) tablet 5 mg (5 mg Oral Given 12/01/15 1232)     ____________________________________________   INITIAL IMPRESSION /  ASSESSMENT AND PLAN / ED COURSE  Pertinent labs & imaging results that were available during my care of the patient were reviewed by me and considered in my medical decision making (see chart for details).  Clinical Course  Comment By Time  Hip and lumbar x-rays were reviewed with the patient and her husband at the bedside. Patient notes that the Vicodin did not help any with her pain. Anticipate that her pain is nerve related and potentially sciatica as there is a slight decrease in height of the L4-L5 disc space. Patient is amenable to Solu-Medrol injection in which I believe will help with her pain. Braxton Feathers, PA-C 08/30 1201  Results of venous ultrasound were discussed with patient and her husband at bedside. Patient is amenable to continue with treatment for sciatica to include oral prednisone to begin tomorrow morning. Patient will also be given diazepam to assist with muscle pain and spasm. Patient understands she is to follow-up with her primary care provider or orthopedic specialist in 48 hours if not improving. Braxton Feathers, PA-C 08/30 1223    Patient's diagnosis  is consistent with Sciatica the right side. Patient will be discharged home with prescriptions for prednisone and diazepam to use as directed. IF patient has decreased pain through the day today as a result of SoluMedrol injection, she will not start oral prednisone tomorrow. Patient has been counseled in regards to side effects of hyperglycemia while on prednisone and she should follow her sugars very closely. She will consult with her PCP in regards to adjustment in insulin while on steroids. Patient is to follow up with her PCP or Dr. Sabra Heck in orthopedics if symptoms persist past this treatment course. Patient is given ED precautions to return to the ED for any worsening or new symptoms.      ____________________________________________  FINAL CLINICAL IMPRESSION(S) / ED DIAGNOSES  Final diagnoses:  Sciatica of right side      NEW MEDICATIONS STARTED DURING THIS VISIT:  Discharge Medication List as of 12/01/2015 12:24 PM    START taking these medications   Details  diazepam (VALIUM) 2 MG tablet Take 1 tablet (2 mg total) by mouth every 8 (eight) hours as needed for muscle spasms., Starting Wed 12/01/2015, Until Wed 12/08/2015, Print    predniSONE (DELTASONE) 10 MG tablet Take a daily regimen of 6,5,4,3,2,1, Print             Braxton Feathers, PA-C 12/01/15 1358    Rudene Re, MD 12/02/15 [phone]

## 2015-12-02 ENCOUNTER — Other Ambulatory Visit: Payer: Self-pay

## 2015-12-02 DIAGNOSIS — M13859 Other specified arthritis, unspecified hip: Secondary | ICD-10-CM | POA: Diagnosis not present

## 2015-12-02 DIAGNOSIS — M4726 Other spondylosis with radiculopathy, lumbar region: Secondary | ICD-10-CM | POA: Diagnosis not present

## 2015-12-02 DIAGNOSIS — M47816 Spondylosis without myelopathy or radiculopathy, lumbar region: Secondary | ICD-10-CM | POA: Insufficient documentation

## 2015-12-07 DIAGNOSIS — M4726 Other spondylosis with radiculopathy, lumbar region: Secondary | ICD-10-CM | POA: Diagnosis not present

## 2015-12-08 ENCOUNTER — Ambulatory Visit: Payer: PPO | Admitting: Physical Therapy

## 2015-12-09 DIAGNOSIS — M4726 Other spondylosis with radiculopathy, lumbar region: Secondary | ICD-10-CM | POA: Diagnosis not present

## 2015-12-14 DIAGNOSIS — M4806 Spinal stenosis, lumbar region: Secondary | ICD-10-CM | POA: Diagnosis not present

## 2015-12-14 DIAGNOSIS — M4726 Other spondylosis with radiculopathy, lumbar region: Secondary | ICD-10-CM | POA: Diagnosis not present

## 2015-12-18 DIAGNOSIS — M47817 Spondylosis without myelopathy or radiculopathy, lumbosacral region: Secondary | ICD-10-CM | POA: Diagnosis not present

## 2015-12-21 ENCOUNTER — Encounter: Payer: PPO | Attending: Endocrinology | Admitting: Nutrition

## 2015-12-21 ENCOUNTER — Ambulatory Visit (INDEPENDENT_AMBULATORY_CARE_PROVIDER_SITE_OTHER): Payer: PPO | Admitting: Endocrinology

## 2015-12-21 VITALS — BP 160/84 | HR 94 | Ht 62.0 in | Wt 143.0 lb

## 2015-12-21 DIAGNOSIS — E1165 Type 2 diabetes mellitus with hyperglycemia: Secondary | ICD-10-CM

## 2015-12-21 DIAGNOSIS — I10 Essential (primary) hypertension: Secondary | ICD-10-CM | POA: Diagnosis not present

## 2015-12-21 DIAGNOSIS — E1140 Type 2 diabetes mellitus with diabetic neuropathy, unspecified: Secondary | ICD-10-CM

## 2015-12-21 DIAGNOSIS — Z794 Long term (current) use of insulin: Secondary | ICD-10-CM

## 2015-12-21 DIAGNOSIS — IMO0002 Reserved for concepts with insufficient information to code with codable children: Secondary | ICD-10-CM

## 2015-12-21 NOTE — Patient Instructions (Addendum)
Check blood sugars on waking up  3-4x per week  Also check blood sugars about 2 hours after a meal and do this after different meals by rotation  Recommended blood sugar levels on waking up is 90-130 and about 2 hours after meal is 130-160  Please bring your blood sugar monitor to each visit, thank you  No Lantus nite before V-go start  3 clicks at Bfst, 2 clicks at lunch and 4 at dinner

## 2015-12-21 NOTE — Progress Notes (Signed)
Patient ID: Melinda Ford, female   DOB: 05/19/1945, 70 y.o.   MRN: [id number]            Reason for Appointment: Consultation for Type 2 Diabetes  Referring physician: Plonk   History of Present Illness:          Date of diagnosis of type 2 diabetes mellitus: ?  2004        Background history:   She previously had been on metformin and also Avandia initially Detailed records of her care are not available prior to 2013 or so She was tried on Januvia in 2016 but this was not effective and this was stopped in 5/70 She was started on insulin in 2/16 with small doses of Lantus insulin Her A1c has been consistently over 8% since about 2013  Recent history:   She is being referred here for persistently poor control  INSULIN regimen is:  Lantus 35 units hs    Non-insulin hypoglycemic drugs the patient is taking are: metformin 500 mg twice a day  Current management, blood sugar patterns and problems identified:  Since she gets diarrhea and nausea with 1000 mg of metformin she is taking only 500 mg twice a day  She has been on only Lantus insulin for treatment in addition to her metformin more recently  The dose of Lantus has been increased to 35 units recently, previously 30 units.  She thinks her blood sugars were higher with taking prednisone about 2-3 weeks ago for her sciatica  Blood sugars are significantly higher at bedtime but still relatively high fasting   Recently not complaining of any increasing thirst or urination          Side effects from medications have been: nausea and diarrhea from 1000 MG metformin  Compliance with the medical regimen: fair Hypoglycemia:  level  Glucose monitoring:  done 2  times a day         Glucometer: Contour        Blood Glucose readings by recall:   PREMEAL Breakfast Lunch Dinner Bedtime  Overall   Glucose range: 150   200 -300   Median:        Self-care: The diet that the patient has been following is: tries to limit .       Meal times are:  Breakfast is at 9-10 AM Typical meal intake: Breakfast is toast, oatmeal, bacon, milk   Lunch: hot dogs, fish sandwich Evening meal is fried or cooked chicken, steak, pork chop, corn, greens              Dietician visit, most recent: 2013               Exercise:  none recently, previously was going for Silver sneakers, 2-3 times a week in the morning for 1 hour  Weight history: Wt Readings from Last 3 Encounters:  12/21/15 143 lb (64.9 kg)  11/30/15 145 lb (65.8 kg)  11/29/15 145 lb (65.8 kg)    Glycemic control:   Lab Results  Component Value Date   HGBA1C 9.3 (H) 11/02/2015   HGBA1C 9.3 (H) 09/10/2015   HGBA1C 9.3 (H) 07/28/2015   Lab Results  Component Value Date   LDLCALC 92 09/17/2014   CREATININE 0.51 (L) 09/10/2015   Lab Results  Component Value Date   MICRALBCREAT 4.0 09/10/2015         Medication List       Accurate as of 12/21/15  8:44 PM. Always use  your most recent med list.          acetaminophen 500 MG tablet Commonly known as:  TYLENOL Take 1,000 mg by mouth 2 (two) times daily.   amLODipine 10 MG tablet Commonly known as:  NORVASC Take 1 tablet (10 mg total) by mouth daily.   aspirin 81 MG tablet Take 1 tablet (81 mg total) by mouth daily.   FIFTY50 GLUCOSE METER 2.0 w/Device Kit 1 Units by Other route 3 (three) times daily as needed.   GINKGO BILOBA COMPLEX PO Take by mouth.   glucose blood test strip Commonly known as:  GLUCOSE METER TEST 1 each by Other route 3 (three) times daily. Use for Diabetes Mellitus Type 2 to check Blood Glucose levels up to 3 times daily.   insulin glargine 100 UNIT/ML injection Commonly known as:  LANTUS Inject 0.3 mLs (30 Units total) into the skin daily.   INSULIN SYRINGE .3CC/31GX5/16" 31G X 5/16" 0.3 ML Misc Inject 1 Units as directed 2 (two) times daily.   losartan 100 MG tablet Commonly known as:  COZAAR Take 1 tablet (100 mg total) by mouth daily.   meloxicam 15 MG  tablet Commonly known as:  MOBIC Take 1 tablet (15 mg total) by mouth daily.   metFORMIN 500 MG tablet Commonly known as:  GLUCOPHAGE Take 1 tablet (500 mg total) by mouth 2 (two) times daily with a meal.   oxycodone-acetaminophen 2.5-325 MG tablet Commonly known as:  PERCOCET Take 1 tablet by mouth every 4 (four) hours as needed for pain.   predniSONE 10 MG tablet Commonly known as:  DELTASONE Take a daily regimen of 6,5,4,3,2,1   ReliOn Ultra Thin Lancets Misc Inject 1 Units as directed 3 (three) times daily as needed.   traMADol 50 MG tablet Commonly known as:  ULTRAM Take 1 tablet (50 mg total) by mouth every 12 (twelve) hours as needed.   Vitamin D 2000 units Caps Take 1 capsule (2,000 Units total) by mouth daily.       Allergies: No Known Allergies  Past Medical History:  Diagnosis Date  . Allergy   . Arthritis   . Cataract   . Diabetes mellitus without complication ([place])   . Hypertension   . Osteoporosis     Past Surgical History:  Procedure Laterality Date  . ABDOMINAL HYSTERECTOMY      Family History  Problem Relation Age of Onset  . Breast cancer Other 40  . Alcohol abuse Father   . Mental illness Father   . Stroke Father   . Diabetes Sister   . Asthma Brother   . Diabetes Brother   . Diabetes Maternal Aunt   . Heart disease Maternal Aunt   . Diabetes Maternal Uncle   . Early death Maternal Uncle   . Breast cancer Cousin 69    Social History:  reports that she has never smoked. She has never used smokeless tobacco. She reports that she drinks about 0.6 oz of alcohol per week . She reports that she does not use drugs.   Review of Systems  Constitutional: Negative for weight gain and reduced appetite.  Eyes: Negative for blurred vision.  Respiratory: Negative for shortness of breath.   Cardiovascular: Positive for chest pain. Negative for palpitations and leg swelling.       Mild discomfort  Gastrointestinal: Negative for diarrhea.   Endocrine: Negative for fatigue and polydipsia.  Genitourinary: Positive for frequency and nocturia. Negative for dysuria.  Musculoskeletal: Positive for back pain.  Continues to have some low back pain and sciatic on the right side since August   Neurological: Positive for numbness.       Some numbness in the left thigh area.  Also some burning in the right foot   Psychiatric/Behavioral: Positive for insomnia.       Mostly from back pain    Lipid history: Not on Rx    Lab Results  Component Value Date   CHOL 205 (H) 09/17/2014   HDL 99 09/17/2014   LDLCALC 92 09/17/2014   TRIG 70 09/17/2014   CHOLHDL 2.1 09/17/2014           Hypertension: on treatment for 15 yrs, currently on amlodipine and losartan Blood pressure relatively higher today  BP Readings from Last 3 Encounters:  12/21/15 (!) 160/84  12/01/15 (!) 180/88  11/30/15 130/78     Most recent eye exam was in 10/16, no retinopathy reported  Most recent foot exam: 9/17  She recently healed from a left toe metatarsal fracture   LABS:  No visits with results within 1 Week(s) from this visit.  Latest known visit with results is:  Office Visit on 11/02/2015  Component Date Value Ref Range Status  . POC Glucose 11/02/2015 198* 70 - 99 mg/dl Final  . Hgb A1c MFr Bld 11/03/2015 9.3* 4.8 - 5.6 % Final   Comment:          Pre-diabetes: 5.7 - 6.4          Diabetes: >6.4          Glycemic control for adults with diabetes: <7.0   . Est. average glucose Bld gHb Est-m* 11/03/2015 220  mg/dL Final    Physical Examination:  BP (!) 160/84   Pulse 94   Ht '5\' 2"'$  (1.575 m)   Wt 143 lb (64.9 kg)   BMI 26.16 kg/m   GENERAL:      She is averagely built and nourished, mild obesity HEENT:         Eye exam shows normal external appearance. Fundus exam shows no retinopathy.  Oral exam shows normal mucosa .  NECK:   There is no lymphadenopathy Thyroid is not enlarged and no nodules felt.  Carotids are normal to  palpation and no bruit heard LUNGS:         Chest is symmetrical. Lungs are clear to auscultation.Marland Kitchen   HEART:         Heart sounds:  S1 and S2 are normal. No murmur or click heard., no S3 or S4.   ABDOMEN:   There is no distention present. Liver and spleen are not palpable. No other mass or tenderness present.   NEUROLOGICAL:   Ankle jerks are absent bilaterally. Biceps reflexes are normal    Diabetic Foot Exam - Simple   Simple Foot Form Diabetic Foot exam was performed with the following findings:  Yes 12/21/2015  3:17 PM  Visual Inspection No deformities, no ulcerations, no other skin breakdown bilaterally:  Yes Sensation Testing Intact to touch and monofilament testing bilaterally:  Yes Pulse Check Posterior Tibialis and Dorsalis pulse intact bilaterally:  Yes Comments            Vibration sense is slightly reduced in distal first toes. MUSCULOSKELETAL:  There is no swelling or deformity of the peripheral joints. Spine is normal to inspection.   EXTREMITIES:     There is no edema. No skin lesions present.Marland Kitchen SKIN:       No rash or lesions  of concern.        ASSESSMENT:  Diabetes type 2, uncontrolled  With A1c persistently over 9   On the regimen of basal insulin and low-dose metformin only she has tendency to high postprandial blood sugars Recently glucose control worse with a course of prednisone She has had inadequate diabetes education and not following any particular meal plan, currently diet is relatively high and fat  And  carbohydrate  Since she has had relatively long-standing diabetes she is likely to be insulin deficient Discussed with patient that she is only on basal insulin and is not getting a mealtime insulin to cover postprandial blood sugars She is not able to tolerate higher doses of metformin because of GI side effects  Complications of diabetes: none evident  HYPERTENSION: To follow-up with PCP, blood pressure relatively high today  Has not had  hyperlipidemia  PLAN:     offered the patient the use of the V-go pump instead of basal bolus insulin regimen that may require for insulin injections a day.  Showed her how to use this and  The use of single insulin and disposable devices for basal and bolus needs.  The patient will be instructed today on how to use the V-go pump in detail by nurse educator  She will be given a trial of this next week after verification of insurance approval and if she has affordable out-of-pocket expense  She will also be using either NovoLog or Humalog in the pump  As a trial she will be given a sample of the 30 unit V-go pump's with boluses using 6 units at breakfast, 4 at lunch and 8 units at dinner.  Discussed need to check blood sugars more consistently and with starting the pump to check 4 times a day before meals and bedtime  She will be seen in follow-up 2-3 days after starting the pump  Continue metformin but also consider subsequently adding either Invokana or Trulicity to help with control and prevent weight gain  She also needs consultation with dietitian  Hopefully she can resume exercise when she has recovery from her sciatica pain  Patient Instructions  Check blood sugars on waking up  3-4x per week  Also check blood sugars about 2 hours after a meal and do this after different meals by rotation  Recommended blood sugar levels on waking up is 90-130 and about 2 hours after meal is 130-160  Please bring your blood sugar monitor to each visit, thank you  No Lantus nite before V-go start  3 clicks at Bfst, 2 clicks at lunch and 4 at dinner    Counseling time on subjects discussed above is over 50% of today's 60 minute visit   Avril Busser 12/21/2015, 8:44 PM   Note: This office note was prepared with Dragon voice recognition system technology. Any transcriptional errors that result from this process are unintentional.

## 2015-12-22 ENCOUNTER — Telehealth: Payer: Self-pay | Admitting: Endocrinology

## 2015-12-22 ENCOUNTER — Telehealth: Payer: Self-pay | Admitting: Nutrition

## 2015-12-22 NOTE — Progress Notes (Signed)
Patient is here with her husband to learn about the V-go.  We discussed how it works, how to fill, apply and use it.  She is interested in it, but wants to know how much it will cost her.  We filled out paperwork and I faxed it in.   Both she and her husband requested to be trained on how to use the V-go, because they did not want to return for another visit.  The both were shown how to fill, apply, and use the V-Go and redemonstrated the procedure.  She filled a V-go 30 with Humalog insulin to use when she hears back from [facility].   Written instructions were given for 3 button presses to be given at breakfast, 2 at lunch, and 4 at supper.  She reported good understanding of this, and redemonstrated how to do this appropriately.  She wishes to start this every morning, and she was told to stop her lantus insulin the night before, and begin this in the morning,  Both she and her husband reported good understanding of this.   She was given a V-Go 30 starter kit, with directions for how to fill, apply, remove, and use the V-Go, and a card with the toll free number to call if questions any time.   We discussed low blood sugars--symptoms, and treatments.  She was told to call if this occurs.   She was given a sheet to record her blood sugars and told to test ac, and HS. She was also given a copay card to use.   They had no final questions.

## 2015-12-22 NOTE — Telephone Encounter (Signed)
Patient said that [facility] called saying she needs a preauth. From her insurance company, and it will take at least a week to hear back. She is running low on Lantus and wants to know what to do.  She does not want to buy a vial of Lantus if she will be using the V-Go. I suggested that she start the V-go for 6 days with the free 1 month copay card, and she said that she did not take her Lantus last night.  I suggested that she start on the V-go asap.  She will discuss this with her husband when he gets home in a few min.  She tested her blood sugar, and it is now 242.

## 2015-12-22 NOTE — Patient Instructions (Signed)
Test blood sugars before meals, and at bedtime. Fill and apply a new V-Go every morning Call if having low blood sugars.

## 2015-12-22 NOTE — Telephone Encounter (Signed)
Vgo customer care call sent over a medical acception form for patient, you will need to fill it out and fax it to her insurance.

## 2015-12-22 NOTE — Telephone Encounter (Signed)
Noted  

## 2015-12-27 ENCOUNTER — Other Ambulatory Visit: Payer: Self-pay

## 2015-12-27 ENCOUNTER — Telehealth: Payer: Self-pay

## 2015-12-27 ENCOUNTER — Ambulatory Visit: Payer: Self-pay | Admitting: Family Medicine

## 2015-12-27 DIAGNOSIS — I10 Essential (primary) hypertension: Secondary | ICD-10-CM

## 2015-12-27 MED ORDER — AMLODIPINE BESYLATE 10 MG PO TABS: 10.0000 mg | ORAL_TABLET | Freq: Every day | ORAL | 0 refills | Status: DC

## 2015-12-27 MED ORDER — LOSARTAN POTASSIUM 100 MG PO TABS: 100.0000 mg | ORAL_TABLET | Freq: Every day | ORAL | 0 refills | Status: DC

## 2015-12-27 NOTE — Telephone Encounter (Signed)
I contacted pt to tell her about her appt with [facility] on Jan 18, 2016 @ 12:30. She informed me that she had an appt with Dr Dwyane Dee @ [facility] this Wed on 12/29/15. We will cancel her appt with [facility]

## 2015-12-28 ENCOUNTER — Encounter: Payer: PPO | Admitting: Nutrition

## 2015-12-28 DIAGNOSIS — M25559 Pain in unspecified hip: Secondary | ICD-10-CM | POA: Diagnosis not present

## 2015-12-29 ENCOUNTER — Encounter: Payer: Self-pay | Admitting: Endocrinology

## 2015-12-29 ENCOUNTER — Ambulatory Visit (INDEPENDENT_AMBULATORY_CARE_PROVIDER_SITE_OTHER): Payer: PPO | Admitting: Endocrinology

## 2015-12-29 ENCOUNTER — Encounter: Payer: PPO | Admitting: Nutrition

## 2015-12-29 VITALS — BP 144/82 | HR 78 | Temp 98.0°F | Resp 14 | Ht 62.0 in | Wt 146.2 lb

## 2015-12-29 DIAGNOSIS — Z794 Long term (current) use of insulin: Secondary | ICD-10-CM | POA: Diagnosis not present

## 2015-12-29 DIAGNOSIS — E1140 Type 2 diabetes mellitus with diabetic neuropathy, unspecified: Secondary | ICD-10-CM

## 2015-12-29 DIAGNOSIS — IMO0002 Reserved for concepts with insufficient information to code with codable children: Secondary | ICD-10-CM

## 2015-12-29 DIAGNOSIS — E1165 Type 2 diabetes mellitus with hyperglycemia: Secondary | ICD-10-CM | POA: Diagnosis not present

## 2015-12-29 NOTE — Patient Instructions (Addendum)
5 clicks at supper  1 click for each Carb  Check blood sugars on waking up  3-4 x per week  Also check blood sugars about 2 hours after a meal and do this after different meals by rotation  Recommended blood sugar levels on waking up is 90-130 and about 2 hours after meal is 130-160  Please bring your blood sugar monitor to each visit, thank you

## 2015-12-29 NOTE — Progress Notes (Signed)
Patient ID: Melinda Ford, female   DOB: 07/06/1945, 70 y.o.   MRN: [id number]            Reason for Appointment: Follow-up for Type 2 Diabetes  Referring physician: Plonk   History of Present Illness:          Date of diagnosis of type 2 diabetes mellitus: ?  2004        Background history:   She previously had been on metformin and also Avandia initially Detailed records of her care are not available prior to 2013 or so She was tried on Januvia in 2016 but this was not effective and this was stopped in 5/70 She was started on insulin in 2/16 with small doses of Lantus insulin Her A1c has been consistently over 8% since about 2013  Recent history:    INSULIN regimen is:   V-go 30 unit pump since 12/23/15, boluses 0--0-7 clicks with meals     Non-insulin hypoglycemic drugs the patient is taking are: metformin 500 mg twice a day  Current management, blood sugar patterns and problems identified:  Since she had poor control with basal insulin only she was started on the V-go pump after training.  She is now on the sixth day of her pump  With this her fasting readings are progressively improving and down to 92 today.  Blood sugars are high twice at lunchtime and also at suppertime  Blood sugars are consistently high after supper at night although not checking everyday  She had only one low blood sugar at 3 PM when she was late for lunch  She is not sure if she is getting insurance coverage for the pump currently          Side effects from medications have been: nausea and diarrhea from 1000 MG metformin  Compliance with the medical regimen: fair Hypoglycemia:  level  Glucose monitoring:  done 2  times a day         Glucometer: Contour        Blood Glucose readings by   Mean values apply above for all meters except median for One Touch  PRE-MEAL Fasting Lunch Dinner Bedtime Overall  Glucose range: 92-234  96-2 74  67-265  186-336    Mean/median: 132 163 186 264     Self-care:   Meal times are:  Breakfast is at 9-10 AM  Typical meal intake: Breakfast is toast, oatmeal, bacon, milk   Lunch: hot dogs, fish sandwich Evening meal is fried or cooked chicken, steak, pork chop, corn, greens              Dietician visit, most recent: 2013               Exercise:  none recently, previously was going for Silver sneakers, 2-3 times a week in the morning for 1 hour  Weight history:  Wt Readings from Last 3 Encounters:  12/29/15 146 lb 3.2 oz (66.3 kg)  12/21/15 143 lb (64.9 kg)  11/30/15 145 lb (65.8 kg)    Glycemic control:   Lab Results  Component Value Date   HGBA1C 9.3 (H) 11/02/2015   HGBA1C 9.3 (H) 09/10/2015   HGBA1C 9.3 (H) 07/28/2015   Lab Results  Component Value Date   LDLCALC 92 09/17/2014   CREATININE 0.51 (L) 09/10/2015   Lab Results  Component Value Date   MICRALBCREAT 4.0 09/10/2015         Medication List  Accurate as of 12/29/15  9:46 PM. Always use your most recent med list.          acetaminophen 500 MG tablet Commonly known as:  TYLENOL Take 1,000 mg by mouth 2 (two) times daily.   amLODipine 10 MG tablet Commonly known as:  NORVASC Take 1 tablet (10 mg total) by mouth daily.   aspirin 81 MG tablet Take 1 tablet (81 mg total) by mouth daily.   cyclobenzaprine 10 MG tablet Commonly known as:  FLEXERIL Take 10 mg by mouth 3 (three) times daily as needed for muscle spasms.   FIFTY50 GLUCOSE METER 2.0 w/Device Kit 1 Units by Other route 3 (three) times daily as needed.   GINKGO BILOBA COMPLEX PO Take by mouth.   glucose blood test strip Commonly known as:  GLUCOSE METER TEST 1 each by Other route 3 (three) times daily. Use for Diabetes Mellitus Type 2 to check Blood Glucose levels up to 3 times daily.   insulin glargine 100 UNIT/ML injection Commonly known as:  LANTUS Inject 0.3 mLs (30 Units total) into the skin daily.   insulin lispro 100 UNIT/ML injection Commonly known as:   HUMALOG Inject 78 Units into the skin once. Use with V-go   INSULIN SYRINGE .3CC/31GX5/16" 31G X 5/16" 0.3 ML Misc Inject 1 Units as directed 2 (two) times daily.   losartan 100 MG tablet Commonly known as:  COZAAR Take 1 tablet (100 mg total) by mouth daily.   meloxicam 15 MG tablet Commonly known as:  MOBIC Take 1 tablet (15 mg total) by mouth daily.   metFORMIN 500 MG tablet Commonly known as:  GLUCOPHAGE Take 1 tablet (500 mg total) by mouth 2 (two) times daily with a meal.   oxycodone-acetaminophen 2.5-325 MG tablet Commonly known as:  PERCOCET Take 1 tablet by mouth every 4 (four) hours as needed for pain.   predniSONE 10 MG tablet Commonly known as:  DELTASONE Take a daily regimen of 6,5,4,3,2,1   ReliOn Ultra Thin Lancets Misc Inject 1 Units as directed 3 (three) times daily as needed.   traMADol 50 MG tablet Commonly known as:  ULTRAM Take 1 tablet (50 mg total) by mouth every 12 (twelve) hours as needed.   Vitamin D 2000 units Caps Take 1 capsule (2,000 Units total) by mouth daily.       Allergies: No Known Allergies  Past Medical History:  Diagnosis Date  . Allergy   . Arthritis   . Cataract   . Diabetes mellitus without complication ([place])   . Hypertension   . Osteoporosis     Past Surgical History:  Procedure Laterality Date  . ABDOMINAL HYSTERECTOMY      Family History  Problem Relation Age of Onset  . Breast cancer Other 40  . Alcohol abuse Father   . Mental illness Father   . Stroke Father   . Diabetes Sister   . Asthma Brother   . Diabetes Brother   . Diabetes Maternal Aunt   . Heart disease Maternal Aunt   . Diabetes Maternal Uncle   . Early death Maternal Uncle   . Breast cancer Cousin 36    Social History:  reports that she has never smoked. She has never used smokeless tobacco. She reports that she drinks about 0.6 oz of alcohol per week . She reports that she does not use drugs.   Review of Systems    Lipid history:  Not on Rx, no recent labs available  Lab Results  Component Value Date   CHOL 205 (H) 09/17/2014   HDL 99 09/17/2014   LDLCALC 92 09/17/2014   TRIG 70 09/17/2014   CHOLHDL 2.1 09/17/2014           Hypertension: on treatment for 15 yrs, currently on amlodipine and losartan  BP Readings from Last 3 Encounters:  12/29/15 (!) 144/82  12/21/15 (!) 160/84  12/01/15 (!) 180/88     Most recent eye exam was in 10/16, no retinopathy reported  Most recent foot exam: 9/17    LABS:  No visits with results within 1 Week(s) from this visit.  Latest known visit with results is:  Office Visit on 11/02/2015  Component Date Value Ref Range Status  . POC Glucose 11/02/2015 198* 70 - 99 mg/dl Final  . Hgb A1c MFr Bld 11/03/2015 9.3* 4.8 - 5.6 % Final   Comment:          Pre-diabetes: 5.7 - 6.4          Diabetes: >6.4          Glycemic control for adults with diabetes: <7.0   . Est. average glucose Bld gHb Est-m* 11/03/2015 220  mg/dL Final    Physical Examination:  BP (!) 144/82   Pulse 78   Temp 98 F (36.7 C)   Resp 14   Ht '5\' 2"'$  (1.575 m)   Wt 146 lb 3.2 oz (66.3 kg)   SpO2 98%   BMI 26.74 kg/m           ASSESSMENT:  Diabetes type 2, uncontrolled  With A1c persistently over 9   On the regimen of V-go pump her blood sugars appear to be improving although as discussed above they are significantly high after evening meal Fasting readings are progressively improving See history of present illness for detailed discussion of current diabetes management, blood sugar patterns and problems identified She had mild hypoglycemic event when she was late for lunch indicating her basal insulin may be somewhat excessive during the day She is not able to tolerate higher doses of metformin because of GI side effects   PLAN:    She will further discuss management of her diabetes with the nurse educator  Also needs to significantly improve her diet and reduce fat intake she may be  getting postprandial hyperglycemia from this  She will increase her coverage for her evening meal to 10 units  She can try to use 2 units bolus per carbohydrate, sometimes she does have a large amount of carbohydrate at breakfast also  Discussed that if she is not able to start on the V-go pump she will have to take 2 types of insulins with a basal bolus regimen with 4 injections a day She needs more consistent monitoring 2 hours after meals Discussed blood sugar targets Since she is having progressive decline in fasting readings and also low sugar before lunch she will need to try to 20 units pump   she needs lipid panel done     Patient Instructions  5 clicks at supper  1 click for each Carb  Check blood sugars on waking up  3-4 x per week  Also check blood sugars about 2 hours after a meal and do this after different meals by rotation  Recommended blood sugar levels on waking up is 90-130 and about 2 hours after meal is 130-160  Please bring your blood sugar monitor to each visit, thank you   Counseling time on subjects  discussed above is over 50% of today's 25 minute visit   Melinda Ford 12/29/2015, 9:46 PM   Note: This office note was prepared with Estate agent. Any transcriptional errors that result from this process are unintentional.

## 2015-12-30 ENCOUNTER — Telehealth: Payer: Self-pay | Admitting: Endocrinology

## 2015-12-30 ENCOUNTER — Other Ambulatory Visit: Payer: Self-pay | Admitting: *Deleted

## 2015-12-30 DIAGNOSIS — E109 Type 1 diabetes mellitus without complications: Secondary | ICD-10-CM

## 2015-12-30 MED ORDER — METFORMIN HCL 500 MG PO TABS: 500.0000 mg | ORAL_TABLET | Freq: Two times a day (BID) | ORAL | 0 refills | Status: DC

## 2015-12-30 NOTE — Progress Notes (Signed)
Pt. Is here with husband to review V-Go steps.  They were given a V-Go 20 starter kit, due to low blood sugars when delaying meals.   They reported no difficulty wearing, filling or using the V-Go daily.  Reviewed the need to test ac and HS for now, or this new dosage.   Called [place],and a prior [place]. Is needed.  They were to refax paperwork for this, since we did not receive it.   They were given a few more samples to tied them over until the authorization was approved.  Reviewed her diet history.  Some meals had no carbs, and some had all carbs and no protein.  Stressed the need to balance meal to prevent low blood sugars and  Hunger.  Suggested she make an appt. With the dietitian to help her with this.

## 2015-12-30 NOTE — Telephone Encounter (Signed)
Pt called and needs her Metformin refilled and sent to [facility] in [place], on [address].

## 2015-12-30 NOTE — Telephone Encounter (Signed)
Rx sent to [facility] in [facility]

## 2015-12-30 NOTE — Patient Instructions (Signed)
Continue to test blood sugars before meals and at bedtime Make sure all meals have protein and carbs in them

## 2016-01-05 NOTE — Telephone Encounter (Signed)
Patient need to know how many come in a vgo packet what is a month supply Vgo 20 please advise

## 2016-01-10 ENCOUNTER — Telehealth: Payer: Self-pay | Admitting: Endocrinology

## 2016-01-10 NOTE — Telephone Encounter (Signed)
I have called twice and left messages, she has not returned my call.

## 2016-01-10 NOTE — Telephone Encounter (Signed)
Message left for patient to return call.

## 2016-01-10 NOTE — Telephone Encounter (Signed)
Pt called and said she needed to talk to [place] as soon as possible.  She said she has been calling all day and is very frantic to talk to [place] about her VGo/Pump options.  Please call back at your earliest convenience.

## 2016-01-10 NOTE — Telephone Encounter (Signed)
Patient stated she speak to someone about her vgo  Pump.

## 2016-01-11 ENCOUNTER — Other Ambulatory Visit: Payer: Self-pay | Admitting: *Deleted

## 2016-01-11 MED ORDER — GLUCOSE BLOOD VI STRP: ORAL_STRIP | 3 refills | Status: DC

## 2016-01-11 MED ORDER — V-GO 20 KIT
PACK | 2 refills | Status: DC
Start: 2016-01-11 — End: 2016-04-24

## 2016-01-11 NOTE — Telephone Encounter (Signed)
Pt returning you call please call her home number asap She is very unsettled about what she is supposed to do about insulin

## 2016-01-11 NOTE — Telephone Encounter (Signed)
Patient stated she need to talk to you, she do not understand. Please Advise

## 2016-01-11 NOTE — Telephone Encounter (Signed)
Pt returning your call

## 2016-01-12 ENCOUNTER — Telehealth: Payer: Self-pay | Admitting: Endocrinology

## 2016-01-12 NOTE — Telephone Encounter (Signed)
Patient states she needed to speak to Dr Dwyane Dee, she has insurance questions and has run out of samples and the office need the the Rx order.  Will her insurance cover it how does this process work. She has been trying to get this handled for two days.   Called the [facility].

## 2016-01-13 ENCOUNTER — Other Ambulatory Visit: Payer: Self-pay

## 2016-01-13 MED ORDER — INSULIN ASPART 100 UNIT/ML ~~LOC~~ SOLN: SUBCUTANEOUS | 3 refills | Status: DC

## 2016-01-13 NOTE — Telephone Encounter (Signed)
Yes, same doses 

## 2016-01-13 NOTE — Telephone Encounter (Signed)
No problem. She can change.

## 2016-01-13 NOTE — Telephone Encounter (Signed)
Sent in [place] as patient requested, and okay per Dr.Gherghe to replace Humalog. Same dosages.

## 2016-01-13 NOTE — Telephone Encounter (Signed)
Spoke with patient and she wants to change to Novalog instead of using the Humalog that Dr. Dwyane Dee prescribed because of the cost- can I end that in or do I need approval first- please advise

## 2016-01-13 NOTE — Telephone Encounter (Signed)
Blase Mess is calling about the test strips rx they have a quantity question please call [phone]

## 2016-01-13 NOTE — Telephone Encounter (Signed)
I spoke with the patient concerning her prescription a couple days ago, I've sent the prescription for her V-go (she is aware of that).  I also called [facility] to clarify her test strip order, patient is testing 4 times per day with 400 test strips per 90 days with 1 refill.  Dr. Dwyane Dee does not talk to the patients about Insurance questions.

## 2016-01-14 MED ORDER — INSULIN ASPART 100 UNIT/ML ~~LOC~~ SOLN: SUBCUTANEOUS | 2 refills | Status: DC

## 2016-01-14 NOTE — Telephone Encounter (Signed)
I contacted the patient and advised Dr. Cruzita Lederer had approved switching from the humalog to the novolog during Dr. Ronnie Derby absence. Patient advised we had originally submitted the refill the [facility] mail service on 01/13/2016. Patient stated this was not correct and needed to refilled through [facility]. Refill submitted and patient had no further questions at this time.

## 2016-01-14 NOTE — Telephone Encounter (Signed)
Patient asked if she could use the Novalog instead of Humalog. Please advise

## 2016-01-15 DIAGNOSIS — M25551 Pain in right hip: Secondary | ICD-10-CM | POA: Diagnosis not present

## 2016-01-17 ENCOUNTER — Other Ambulatory Visit: Payer: Self-pay

## 2016-01-17 ENCOUNTER — Telehealth: Payer: Self-pay

## 2016-01-17 NOTE — Telephone Encounter (Signed)
Patient called stating that she has been getting very high blood sugar readings since she got a steroid injection last Saturday- she would like to know how to adjust her medications please advise

## 2016-01-17 NOTE — Telephone Encounter (Signed)
Spoke with patient and here are the readings since the steroid injection Saturday afternoon:  198- 3pm  195- dinner time  304 bed time  228- breakfast Sunday  255- lunch  312- dinner  201- bed time  191- this morning  421- now   Please advise

## 2016-01-17 NOTE — Telephone Encounter (Signed)
Pt returning call to Lattie Haw  Also asking for One Touch meter and test strips to be refilled to [facility]

## 2016-01-17 NOTE — Telephone Encounter (Signed)
She will need to do extra clicks on her pump before each meal and bedtime as follows:  Blood sugar 150-200: Take 1 extra click, [id number]: Take 3 extra clicks and over [id number] take 4 extra clicks. If running out of insulin in the pump will need to take injections of Novolog separately for high sugars with the same dosage

## 2016-01-17 NOTE — Telephone Encounter (Signed)
Need to know exact sugars  And if she still has Lantus at home

## 2016-01-17 NOTE — Telephone Encounter (Signed)
Spoke with patient and here are her readings since the steroid injection: 198- after shot- 195- 304- Sunday- [phone]- this morning 191 and right now 421 please advise

## 2016-01-18 ENCOUNTER — Other Ambulatory Visit: Payer: Self-pay

## 2016-01-18 MED ORDER — ONETOUCH VERIO W/DEVICE KIT: 1.0000 | PACK | Freq: Four times a day (QID) | 0 refills | Status: DC

## 2016-01-18 MED ORDER — GLUCOSE BLOOD VI STRP: ORAL_STRIP | 12 refills | Status: DC

## 2016-01-18 MED ORDER — GLUCOSE BLOOD VI STRP: ORAL_STRIP | 2 refills | Status: DC

## 2016-01-18 NOTE — Telephone Encounter (Signed)
Patient is returning your call.  

## 2016-01-18 NOTE — Telephone Encounter (Signed)
On the alternatives to take Lantus and NovoLog injections

## 2016-01-18 NOTE — Telephone Encounter (Signed)
Spoke with patient and she wants to discuss the best way to keep her sugar levels down but for now she will continue with what has been instructed

## 2016-01-18 NOTE — Telephone Encounter (Signed)
Spoke with patient and explained the extra clicks to her and she stated an understanding-- wants to know if their is a cheaper way to do the insulin because the vgo counts as a medication instead of a medical supply so she will be in the donut hole by march next year if she continues with the vgo please advise- also patient requesting a one touch verio meter and test strips

## 2016-01-18 NOTE — Telephone Encounter (Signed)
Spoke with patient and made sure she understood the instructions from Dr. Dwyane Dee

## 2016-01-25 ENCOUNTER — Ambulatory Visit (INDEPENDENT_AMBULATORY_CARE_PROVIDER_SITE_OTHER): Payer: PPO | Admitting: Endocrinology

## 2016-01-25 ENCOUNTER — Encounter: Payer: Self-pay | Admitting: Endocrinology

## 2016-01-25 VITALS — BP 128/60 | Ht 62.0 in | Wt 145.0 lb

## 2016-01-25 DIAGNOSIS — E1165 Type 2 diabetes mellitus with hyperglycemia: Secondary | ICD-10-CM

## 2016-01-25 DIAGNOSIS — Z23 Encounter for immunization: Secondary | ICD-10-CM

## 2016-01-25 DIAGNOSIS — Z794 Long term (current) use of insulin: Secondary | ICD-10-CM

## 2016-01-25 NOTE — Patient Instructions (Addendum)
Relion R insulin is ok and click [id number] min before meals

## 2016-01-25 NOTE — Progress Notes (Signed)
Patient ID: Demetrio Lapping, female   DOB: 1945/12/21, 70 y.o.   MRN: [id number]            Reason for Appointment: Follow-up for Type 2 Diabetes   History of Present Illness:          Date of diagnosis of type 2 diabetes mellitus: ?  2004        Background history:   She previously had been on metformin and also Avandia initially Detailed records of her care are not available prior to 2013 or so She was tried on Januvia in 2016 but this was not effective and this was stopped in 5/70 She was started on insulin in 2/16 with small doses of Lantus insulin Her A1c has been consistently over 8% since about 2013  Recent history:   INSULIN regimen is:   V-go 20 unit pump since 12/23/15, boluses 4--0-9 clicks with meals     Non-insulin hypoglycemic drugs the patient is taking are: metformin 500 mg 2 in pm  She has been able to get the V-go pump after prior authorization paperwork was done  Current management, blood sugar patterns and problems identified:  Since her last visit her blood sugars have been quite erratic  She has had steroids twice, once last month and again had a steroid injection for her hip about a week or so ago  However blood sugars appear to be significantly high through the 19th of this month especially after meals  She had been advised to take extra bolus insulin for high sugars and also to NovoLog injection but she did not change her regimen much  Since 10/20 her blood sugars are mostly near target although checking mostly in the morning  Fasting reading recently as low as 95 and highest postprandial reading 183 in the last few days  Her lowest blood sugar was 61 on the 14th but no other low sugars   She was told by nurse educator to get protein with every meal and see the dietitian but she has not done so       Side effects from medications have been: nausea and diarrhea from 1000 MG metformin  Compliance with the medical regimen: fair Hypoglycemia:   level  Glucose monitoring:  done 3 times a day         Glucometer: Contour        Blood Glucose readings by download for the last 2 weeks:  Mean values apply above for all meters except median for One Touch  PRE-MEAL Fasting Lunch Dinner Bedtime Overall  Glucose range: 61-228  133-421  106-225  146-321    Mean/median: 159  221  190  231  202   Self-care:   Meal times are:  Breakfast is at 9-10 AM  Typical meal intake: Breakfast is toast, oatmeal, bacon, milk   Lunch: hot dogs, fish sandwich Evening meal is  chicken, steak, pork chop, corn, greens.  Has been advised to cut back on fried food               Dietician visit, most recent: 2013 CDE visit: 12/2015               Exercise:  none recently, previously was going for Silver sneakers, 2-3 times a week in the morning for 1 hour  Weight history:  Wt Readings from Last 3 Encounters:  01/25/16 145 lb (65.8 kg)  12/29/15 146 lb 3.2 oz (66.3 kg)  12/21/15 143 lb (64.9 kg)  Glycemic control:   Lab Results  Component Value Date   HGBA1C 9.3 (H) 11/02/2015   HGBA1C 9.3 (H) 09/10/2015   HGBA1C 9.3 (H) 07/28/2015   Lab Results  Component Value Date   LDLCALC 92 09/17/2014   CREATININE 0.51 (L) 09/10/2015   Lab Results  Component Value Date   MICRALBCREAT 4.0 09/10/2015         Medication List       Accurate as of 01/25/16 12:57 PM. Always use your most recent med list.          acetaminophen 500 MG tablet Commonly known as:  TYLENOL Take 1,000 mg by mouth 2 (two) times daily.   amLODipine 10 MG tablet Commonly known as:  NORVASC Take 1 tablet (10 mg total) by mouth daily.   aspirin 81 MG tablet Take 1 tablet (81 mg total) by mouth daily.   cyclobenzaprine 10 MG tablet Commonly known as:  FLEXERIL Take 10 mg by mouth 3 (three) times daily as needed for muscle spasms.   GINKGO BILOBA COMPLEX PO Take by mouth.   glucose blood test strip Commonly known as:  ONETOUCH VERIO Use to test blood sugar  4 times daily   insulin aspart 100 UNIT/ML injection Commonly known as:  NOVOLOG 75 units per the v-go pump.   INSULIN SYRINGE .3CC/31GX5/16" 31G X 5/16" 0.3 ML Misc Inject 1 Units as directed 2 (two) times daily.   losartan 100 MG tablet Commonly known as:  COZAAR Take 1 tablet (100 mg total) by mouth daily.   metFORMIN 500 MG tablet Commonly known as:  GLUCOPHAGE Take 1 tablet (500 mg total) by mouth 2 (two) times daily with a meal.   ONETOUCH VERIO w/Device Kit 1 each by Does not apply route 4 (four) times daily.   oxycodone-acetaminophen 2.5-325 MG tablet Commonly known as:  PERCOCET Take 1 tablet by mouth every 4 (four) hours as needed for pain.   ReliOn Ultra Thin Lancets Misc Inject 1 Units as directed 3 (three) times daily as needed.   V-GO 20 Kit Use 1 per day   Vitamin D 2000 units Caps Take 1 capsule (2,000 Units total) by mouth daily.       Allergies: No Known Allergies  Past Medical History:  Diagnosis Date  . Allergy   . Arthritis   . Cataract   . Diabetes mellitus without complication ([place])   . Hypertension   . Osteoporosis     Past Surgical History:  Procedure Laterality Date  . ABDOMINAL HYSTERECTOMY      Family History  Problem Relation Age of Onset  . Breast cancer Other 40  . Alcohol abuse Father   . Mental illness Father   . Stroke Father   . Diabetes Sister   . Asthma Brother   . Diabetes Brother   . Diabetes Maternal Aunt   . Heart disease Maternal Aunt   . Diabetes Maternal Uncle   . Early death Maternal Uncle   . Breast cancer Cousin 16    Social History:  reports that she has never smoked. She has never used smokeless tobacco. She reports that she drinks about 0.6 oz of alcohol per week . She reports that she does not use drugs.   Review of Systems  Lipid history: Not onAny treatment Followed by PCP  Lab Results  Component Value Date   CHOL 205 (H) 09/17/2014   HDL 99 09/17/2014   LDLCALC 92 09/17/2014   TRIG  70 09/17/2014  CHOLHDL 2.1 09/17/2014           Hypertension: on treatment for 15 yrs, currently on amlodipine and losartan  BP Readings from Last 3 Encounters:  01/25/16 128/60  12/29/15 (!) 144/82  12/21/15 (!) 160/84     Most recent eye exam was in 10/16, no retinopathy reported  Most recent foot exam: 9/17    LABS:  No visits with results within 1 Week(s) from this visit.  Latest known visit with results is:  Office Visit on 11/02/2015  Component Date Value Ref Range Status  . POC Glucose 11/02/2015 198* 70 - 99 mg/dl Final  . Hgb A1c MFr Bld 11/03/2015 9.3* 4.8 - 5.6 % Final   Comment:          Pre-diabetes: 5.7 - 6.4          Diabetes: >6.4          Glycemic control for adults with diabetes: <7.0   . Est. average glucose Bld gHb Est-m* 11/03/2015 220  mg/dL Final    Physical Examination:  BP 128/60   Ht 5' 2"  (1.575 m)   Wt 145 lb (65.8 kg)   BMI 26.52 kg/m           ASSESSMENT:  Diabetes type 2, uncontrolled  With A1c persistently over 9   On the regimen of V-go pump her blood sugars appear to be improving although Blood sugars were significantly high when she had steroid injections With using the 20 units pump more recently she is not having any low blood sugars in the mornings except once It is only in the last 5 days or so that her blood sugars are looking fairly good most of the time Some of the variability may be related to diet She is not able to tolerate higher doses of metformin because of GI side effects   PLAN:    She will further discuss meal planning with dietitian.  No change in insulin regimen as yet  Discussed options for her treatment including basal bolus injections but she does not want to pay for brand name insulin because it does excessively expensive especially with her Medicare  Discussed timing of doing the boluses with Novolog and blood sugar targets at various times  Since she is very concerned about the cost of NovoLog  she can try Regular Insulin from [employer] and discussed the differences between this and NovoLog.  She will need to bolus at least 15 minutes prior to meals with this  Encouraged her to be active when she is able to  May consider increasing metformin and try metformin ER if her fasting readings tend to be high again Repeat A1c and other labs on the next visit   Influenza vaccine given  Patient Instructions  Relion R insulin is ok and click 15-86 min before meals     Counseling time on subjects discussed above is over 50% of today's 25 minute visit   Jillayne Witte 01/25/2016, 12:57 PM   Note: This office note was prepared with Estate agent. Any transcriptional errors that result from this process are unintentional.

## 2016-01-26 ENCOUNTER — Ambulatory Visit (INDEPENDENT_AMBULATORY_CARE_PROVIDER_SITE_OTHER): Payer: PPO | Admitting: Endocrinology

## 2016-01-26 DIAGNOSIS — Z794 Long term (current) use of insulin: Secondary | ICD-10-CM

## 2016-01-26 DIAGNOSIS — E1165 Type 2 diabetes mellitus with hyperglycemia: Secondary | ICD-10-CM

## 2016-01-28 DIAGNOSIS — E119 Type 2 diabetes mellitus without complications: Secondary | ICD-10-CM | POA: Diagnosis not present

## 2016-01-28 LAB — HM DIABETES EYE EXAM

## 2016-02-02 ENCOUNTER — Telehealth: Payer: Self-pay | Admitting: Nutrition

## 2016-02-02 NOTE — Telephone Encounter (Signed)
Patient called saying that today, she ate not breakfast, and tested her blood sugar at 11:30, and it read 23.  She said she did not feel like it is low, and just treated it with 2 pieces of bread with molasses.  Immediately after eating this, she tested and her blood sugar was 165.  Last week, she was ate a football game, and said she tested her blood sugar before supper, and it read 34.  She said it did not feel low, and had no symptoms.   She reported other lows.   She is seeing Mickel Baas on Friday morning, and requested to see me.   I suggested that if this happens again, she wash her hands and retest her blood sugars.  She agreed to do this. I also suggested she bring her meter in on Friday for it to be downloaded, and shown to Dr. Dwyane Dee.  She agreed to do this. Also she is concerned that she is throwing away a lot of insulin every night, and wondered if she could reuse this, or if there was another way to take this where she would not be "wasting all of this insulin".

## 2016-02-02 NOTE — Telephone Encounter (Signed)
We need to make sure her meter is accurate or she is having faulty test strips. Please discuss with her diet the V-go pump has a capacity to hold 18 clicks and we cannot fill the pump with less insulin

## 2016-02-03 ENCOUNTER — Encounter: Payer: PPO | Admitting: Dietician

## 2016-02-04 ENCOUNTER — Encounter: Payer: PPO | Attending: Endocrinology | Admitting: Dietician

## 2016-02-04 ENCOUNTER — Encounter: Payer: Self-pay | Admitting: Dietician

## 2016-02-04 DIAGNOSIS — E1165 Type 2 diabetes mellitus with hyperglycemia: Secondary | ICD-10-CM | POA: Insufficient documentation

## 2016-02-04 DIAGNOSIS — Z794 Long term (current) use of insulin: Secondary | ICD-10-CM | POA: Diagnosis not present

## 2016-02-04 DIAGNOSIS — E663 Overweight: Secondary | ICD-10-CM

## 2016-02-04 DIAGNOSIS — E1140 Type 2 diabetes mellitus with diabetic neuropathy, unspecified: Secondary | ICD-10-CM

## 2016-02-04 DIAGNOSIS — IMO0002 Reserved for concepts with insufficient information to code with codable children: Secondary | ICD-10-CM

## 2016-02-04 NOTE — Progress Notes (Signed)
Medical Nutrition Therapy:  Appt start time: 0800 end time:  0930.   Assessment:  Primary concerns today: Patient is here with her husband. She would like to learn more information about meal planning.  Hx includes type 2 Diabetes since about 2004, vitamin D deficiency, and HTN.  She has had recent severe back/hip pain and has had a recent steroid injection.  Her last HgbA1C was 9.3% 11/02/15.  Weight today of 148 lbs.  Patient states that she has been gaining weight since she has been unable to exercise.  She is fearful to resume exercise due to fear of increasing her back/hip pain.  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  She has been asked to recheck the blood sugar when she gets these readings to varify accuracy.  She stated that with one low she questioned if she overtreated an elevated blood sugar and meal intake.  The other low was a result of a delayed breakfast.  Patient lives with her husband and oldest son.  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  Patient does the shopping and cooking.  Preferred Learning Style:   No preference indicated   Learning Readiness:   Ready  MEDICATIONS: see list to include Novolog via V-Go, vitamin D and Metformin   DIETARY INTAKE:  Usual eating pattern includes 2-3 meals and 2 snacks per day. Avoided foods include eggs..    24-hr recall:  B ( AM): 1 cup plain oatmeal, apple or other fruit, toast with peanut butter and jelly or cheese,and occasional 2 strips bacon Snk ( AM): none  L ( PM): tuna sandwich (2-3 pieces bread) on [employer] bread and apple Snk ( PM): crackers or another sandwich D ( PM): cabbage, beef ribs, cornbread at times. Snk ( PM): cheese, crackers, popcorn, peanuts, pork skins ("can't stop") Beverages: crystal light  Usual physical activity: ADL's  Estimated energy needs: 1600 calories 180  g carbohydrates 100 g protein 53 g fat  Progress Towards Goal(s):  In progress.   Nutritional Diagnosis:  NB-1.1 Food and nutrition-related knowledge deficit As related to balance of carbohydrate, protein, and fat.  As evidenced by patient report and diet hx.    Intervention:  Nutrition counseling/education related to type 2 diabetes.  Reviewed carbohydrates, proteins, fats and portion sizes.  Discussed mindful eating, label reading, snacks and eating out.  Discussed meal planning and meal timing to help prevent low blood sugars.  Discussed benefit of exercise and possibly getting a physical therapy evaluation to help identify exercises that would not increase pain.  Discussed the treatment for low blood sugar.  Avoid eating in front of the TV. If you are hungry for a snack, put a small portion in a bowl and sit down at the table to eat it. Always have a small portion of protein with each meal and snack. Eat slowly and stop when you are satisfied. Consider seeing a physical therapist to give you exercises that you feel safe with. Armchair exercises or light walking should be fine.  Aim for some form of exercise most days. You may need a snack with carbohydrate and a small amount of protein prior to exercise. Avoid delaying meals. Put glucose tabs in your purse and a small snack.  Aim for 3 Carb Choices per meal (45 grams) +/- 1 either way  Aim for  0-1 Carbs per snack if hungry (or up to 2 carb choices before bed) Include protein in moderation with your meals and snacks Consider reading food labels for Total Carbohydrate and Fat Grams of foods  Teaching Method Utilized:  Visual Auditory Hands on  Handouts given during visit include:  A1C sheet  Snack list  Breakfast ideas  Label reading  Meal plan card  Building a balanced meal  Walk your way to health  Issue of Diabetes Self-Management magazine  Barriers to learning/adherence to lifestyle change: none  Demonstrated  degree of understanding via:  Teach Back   Monitoring/Evaluation:  Dietary intake, exercise, label reading, and body weight prn.

## 2016-02-04 NOTE — Patient Instructions (Addendum)
Avoid eating in front of the TV. If you are hungry for a snack, put a small portion in a bowl and sit down at the table to eat it. Always have a small portion of protein with each meal and snack. Eat slowly and stop when you are satisfied. Consider seeing a physical therapist to give you exercises that you feel safe with. Armchair exercises or light walking should be fine.  Aim for some form of exercise most days. You may need a snack with carbohydrate and a small amount of protein prior to exercise. Avoid delaying meals. Put glucose tabs in your purse and a small snack.  Aim for 3 Carb Choices per meal (45 grams) +/- 1 either way  Aim for 0-1 Carbs per snack if hungry (or up to 2 carb choices before bed) Include protein in moderation with your meals and snacks Consider reading food labels for Total Carbohydrate and Fat Grams of foods

## 2016-02-10 DIAGNOSIS — M25559 Pain in unspecified hip: Secondary | ICD-10-CM | POA: Diagnosis not present

## 2016-02-10 DIAGNOSIS — M25551 Pain in right hip: Secondary | ICD-10-CM | POA: Diagnosis not present

## 2016-02-28 ENCOUNTER — Telehealth: Payer: Self-pay | Admitting: Endocrinology

## 2016-02-28 DIAGNOSIS — Z794 Long term (current) use of insulin: Secondary | ICD-10-CM

## 2016-02-28 DIAGNOSIS — E1165 Type 2 diabetes mellitus with hyperglycemia: Secondary | ICD-10-CM

## 2016-02-28 DIAGNOSIS — E118 Type 2 diabetes mellitus with unspecified complications: Principal | ICD-10-CM

## 2016-02-28 DIAGNOSIS — B351 Tinea unguium: Secondary | ICD-10-CM | POA: Diagnosis not present

## 2016-02-28 DIAGNOSIS — E1142 Type 2 diabetes mellitus with diabetic polyneuropathy: Secondary | ICD-10-CM | POA: Diagnosis not present

## 2016-02-28 NOTE — Telephone Encounter (Signed)
Need lab orders sent [place] station.

## 2016-02-29 ENCOUNTER — Encounter: Payer: Self-pay | Admitting: Physical Therapy

## 2016-02-29 ENCOUNTER — Ambulatory Visit
Admission: EM | Admit: 2016-02-29 | Discharge: 2016-02-29 | Disposition: A | Discharge status: Home / Self Care | Payer: PPO | Attending: Emergency Medicine | Admitting: Emergency Medicine

## 2016-02-29 ENCOUNTER — Ambulatory Visit (INDEPENDENT_AMBULATORY_CARE_PROVIDER_SITE_OTHER): Payer: PPO

## 2016-02-29 ENCOUNTER — Ambulatory Visit: Payer: PPO | Attending: Physical Medicine & Rehabilitation | Admitting: Physical Therapy

## 2016-02-29 DIAGNOSIS — S299XXA Unspecified injury of thorax, initial encounter: Secondary | ICD-10-CM

## 2016-02-29 DIAGNOSIS — M6281 Muscle weakness (generalized): Secondary | ICD-10-CM | POA: Insufficient documentation

## 2016-02-29 DIAGNOSIS — M25551 Pain in right hip: Secondary | ICD-10-CM | POA: Diagnosis not present

## 2016-02-29 DIAGNOSIS — R0781 Pleurodynia: Secondary | ICD-10-CM | POA: Diagnosis not present

## 2016-02-29 DIAGNOSIS — R0789 Other chest pain: Secondary | ICD-10-CM

## 2016-02-29 MED ORDER — OXYCODONE-ACETAMINOPHEN 2.5-325 MG PO TABS: 1.0000 | ORAL_TABLET | Freq: Four times a day (QID) | ORAL | 0 refills | Status: DC | PRN

## 2016-02-29 MED ORDER — IBUPROFEN 400 MG PO TABS: 400.0000 mg | ORAL_TABLET | Freq: Four times a day (QID) | ORAL | 0 refills | Status: DC | PRN

## 2016-02-29 NOTE — Telephone Encounter (Signed)
Left voice mail for a call back

## 2016-02-29 NOTE — ED Triage Notes (Signed)
Patient complains of chest wall pain after hitting the corner of a refrigerator. Patient states that she has been noticing some rib pain. Patient states that this happened last Thursday.

## 2016-02-29 NOTE — ED Provider Notes (Signed)
HPI  SUBJECTIVE:  Melinda Ford is a 70 y.o. female who presents with sharp constant left-sided chest pain with radiation to her back worse with bending over, sneezing coughing, bending forward and turning in bed starting after she walked to refrigerator door for 5 days ago. There are no alleviating factors. She tried turpentine, Mobic, Tylenol without improvement in her symptoms. She denies coughing, wheezing, short of breath, bruising, erythema, swelling in the area of trauma. She has a past medical history of osteoporosis, diabetes, hypertension states that she did not take her medicines this morning. She has past medical history of hip osteoarthritis. States that she has taken Percocet 2.5/325 in the past with no problems. PMD: Adline Potter, MD    Past Medical History:  Diagnosis Date  . Allergy   . Arthritis   . Cataract   . Diabetes mellitus without complication ([place])   . Hypertension   . Osteoporosis     Past Surgical History:  Procedure Laterality Date  . ABDOMINAL HYSTERECTOMY      Family History  Problem Relation Age of Onset  . Breast cancer Other 40  . Alcohol abuse Father   . Mental illness Father   . Stroke Father   . Diabetes Sister   . Asthma Brother   . Diabetes Brother   . Diabetes Maternal Aunt   . Heart disease Maternal Aunt   . Diabetes Maternal Uncle   . Early death Maternal Uncle   . Breast cancer Cousin 29    Social History  Substance Use Topics  . Smoking status: Never Smoker  . Smokeless tobacco: Never Used  . Alcohol use 0.6 oz/week    1 Standard drinks or equivalent per week    No current facility-administered medications for this encounter.   Current Outpatient Prescriptions:  .  acetaminophen (TYLENOL) 500 MG tablet, Take 1,000 mg by mouth 2 (two) times daily., Disp: , Rfl:  .  amLODipine (NORVASC) 10 MG tablet, Take 1 tablet (10 mg total) by mouth daily., Disp: 90 tablet, Rfl: 0 .  aspirin 81 MG tablet, Take 1 tablet (81 mg total)  by mouth daily., Disp: 30 tablet, Rfl:  .  Blood Glucose Monitoring Suppl (ONETOUCH VERIO) w/Device KIT, 1 each by Does not apply route 4 (four) times daily., Disp: 1 kit, Rfl: 0 .  Cholecalciferol (VITAMIN D) 2000 UNITS CAPS, Take 1 capsule (2,000 Units total) by mouth daily., Disp: 30 capsule, Rfl:  .  GINKGO BILOBA COMPLEX PO, Take by mouth., Disp: , Rfl:  .  glucosamine-chondroitin 500-400 MG tablet, Take 1 tablet by mouth 3 (three) times daily., Disp: , Rfl:  .  glucose blood (ONETOUCH VERIO) test strip, Use to test blood sugar 4 times daily, Disp: 400 each, Rfl: 2 .  insulin aspart (NOVOLOG) 100 UNIT/ML injection, 75 units per the v-go pump., Disp: 30 mL, Rfl: 2 .  Insulin Disposable Pump (V-GO 20) KIT, Use 1 per day, Disp: 1 kit, Rfl: 2 .  Insulin Syringe-Needle U-100 (INSULIN SYRINGE .3CC/31GX5/16") 31G X 5/16" 0.3 ML MISC, Inject 1 Units as directed 2 (two) times daily., Disp: 100 each, Rfl: 5 .  losartan (COZAAR) 100 MG tablet, Take 1 tablet (100 mg total) by mouth daily., Disp: 90 tablet, Rfl: 0 .  metFORMIN (GLUCOPHAGE) 500 MG tablet, Take 1 tablet (500 mg total) by mouth 2 (two) times daily with a meal., Disp: 60 tablet, Rfl: 0 .  ReliOn Ultra Thin Lancets MISC, Inject 1 Units as directed 3 (three) times  daily as needed., Disp: , Rfl:  .  cyclobenzaprine (FLEXERIL) 10 MG tablet, Take 10 mg by mouth 3 (three) times daily as needed for muscle spasms., Disp: , Rfl:  .  ibuprofen (ADVIL,MOTRIN) 400 MG tablet, Take 1 tablet (400 mg total) by mouth every 6 (six) hours as needed., Disp: 24 tablet, Rfl: 0 .  oxycodone-acetaminophen (PERCOCET) 2.5-325 MG tablet, Take 1 tablet by mouth every 6 (six) hours as needed for pain., Disp: 20 tablet, Rfl: 0  No Known Allergies   ROS  As noted in HPI.   Physical Exam  BP (!) 175/68 (BP Location: Left Arm) Comment: didn't take medication today  Pulse 60   Temp 97.7 F (36.5 C) (Oral)   Resp 17   Ht 5' 2"  (1.575 m)   Wt 145 lb (65.8 kg)    SpO2 96%   BMI 26.52 kg/m   Constitutional: Well developed, well nourished, no acute distress Eyes:  EOMI, conjunctiva normal bilaterally HENT: Normocephalic, atraumatic,mucus membranes moist Respiratory: Normal inspiratory effort. Positive left sided anterior point tenderness along ribs 5. No bruising, swelling, no appreciable crepitus. No other tenderness over entire chest. Patient able take a deep breath in, lungs sound clear bilaterally.  Cardiovascular: Normal rate GI: nondistended skin: No rash, skin intact Musculoskeletal: no deformities Neurologic: Alert & oriented x 3, no focal neuro deficits Psychiatric: Speech and behavior appropriate   ED Course   Medications - No data to display  Orders Placed This Encounter  Procedures  . DG Ribs Unilateral W/Chest Left    Standing Status:   Standing    Number of Occurrences:   1    Order Specific Question:   Reason for Exam (SYMPTOM  OR DIAGNOSIS REQUIRED)    Answer:   tednerness ant rb 5 s/p trauma r/o fx    No results found for this or any previous visit (from the past 61 hour(s)). Dg Ribs Unilateral W/chest Left  Result Date: 02/29/2016 CLINICAL DATA:  Left anterior chest wall pain, rib pain, recent trauma EXAM: LEFT RIBS AND CHEST - 3+ VIEW COMPARISON:  10/22/2007 FINDINGS: Normal heart size and vascularity. Minor basilar atelectasis. Stable right paratracheal soft tissue density compatible with tortuous vascularity by comparison chest CT. No focal pneumonia, collapse or consolidation. Negative for edema, effusion or pneumothorax. Trachea is midline. Left rib views demonstrate no focal acute displaced rib fracture or focal rib abnormality. IMPRESSION: Stable low volume chest exam with bibasilar atelectasis. No effusion or pneumothorax No acute osseous finding by plain radiography Electronically Signed   By: Jerilynn Mages.  Shick M.D.   On: 02/29/2016 14:44    ED Clinical Impression  Chest wall pain  Injury of chest wall, initial  encounter   ED Assessment/Plan  Given the patient has a history of osteoporosis we'll check rib films. Plan to send home with short course of IBU 400, Percocet 2.5/325 one every 4-6 hours For severe pain.  Reviewed imaging independently. No focal acute displaced rib fracture or focal rib abnormality. No effusion or pneumothorax. See radiology report for details.  Presentation consistent with chest contusion versus a nondisplaced hairline rib fracture. Suspect the latter. Her chest x-ray is normal today. Plan as above   follow up with PMD as needed. To the ER if gets worse.  Discussed  imaging, MDM, plan and followup with patient. Discussed sn/sx that should prompt return to the ED. Patient  agrees with plan.   Meds ordered this encounter  Medications  . oxycodone-acetaminophen (PERCOCET) 2.5-325 MG tablet  Sig: Take 1 tablet by mouth every 6 (six) hours as needed for pain.    Dispense:  20 tablet    Refill:  0  . ibuprofen (ADVIL,MOTRIN) 400 MG tablet    Sig: Take 1 tablet (400 mg total) by mouth every 6 (six) hours as needed.    Dispense:  24 tablet    Refill:  0    *This clinic note was created using [employer]. Therefore, there may be occasional mistakes despite careful proofreading.  ?   Melynda Ripple, MD 03/02/16 1950

## 2016-03-01 NOTE — Therapy (Addendum)
[facility] [address] [place], [place], [address] Phone: [phone]   Fax:  [phone]  Physical Therapy Evaluation  Patient Details  Name: Melinda Ford MRN: [id number] Date of Birth: 08-Jul-1945 Referring Provider: Angola Jimmy, MD  Encounter Date: 02/29/2016  1 of 8  Past Medical History:  Diagnosis Date  . Allergy   . Arthritis   . Cataract   . Diabetes mellitus without complication ([place])   . Hypertension   . Osteoporosis     Past Surgical History:  Procedure Laterality Date  . ABDOMINAL HYSTERECTOMY      There were no vitals filed for this visit.        [facility] PT Assessment - 03/05/16 0001      Assessment   Medical Diagnosis R hip pain   Referring Provider Angola Jimmy, MD   Onset Date/Surgical Date 01/02/16   Prior Therapy yes for LBP       Pt. reports no R hip pain at this moment but states has been to ER 2x in past 2 months for severe R hip pain.  Pt. states she walked into top refrigerator door and has chest pain currently (no brusing or cut reported).       See HEP/ Scifit L5-6 10 min (no pain).     Pt. is a pleasant 70 y/o female with persistent R hip pain, resulting in 2 ER visits in past couple months.  Pt. reports no pain at this moment but >10/10 R hip pain with increase activity.  Pt. presents with good R hip AROM all planes (no capsular tightness noted as compared to L hip).  B LE muscle strength grossly 4/5 MMT except B hip flexion 3+/5 MMT and no c/o pain reported.  No significant LLD.  (-) SLR.  Pt. fatigues during strength testing/ walking assessment.  Slight R antalgic gait pattern with increase distance walked (no assistive device required).  Pt. will benefit from short-term skilled PT to increase R hip stability and promote return to pain-free ex./ [employer] program.           PT Education - 03/05/16 1701    Education provided Yes   Education Details See HEP   Person(s) Educated  Patient   Methods Explanation;Handout;Demonstration   Comprehension Verbalized understanding;Returned demonstration             PT Long Term Goals - 03/05/16 1744      PT LONG TERM GOAL #1   Title Pt. independent with HEP to increase B hip flexion/ abduction strength 1/2 muscle grade to improve pain-free mobility/ return to gym ex.    Baseline Pt. presents with good R hip AROM all planes (no capsular tightness noted as compared to L hip).  B LE muscle strength grossly 4/5 MMT except B hip flexion 3+/5 MMT and no c/o pain reported.   Time 4   Period Weeks   Status New     PT LONG TERM GOAL #2   Title Pt will be able to sit for >30 mins with no reports of R hip/ low back pain in order to be able to read/write comfortably.   Baseline increase pain with prolonged sitting/ activity   Time 4   Period Weeks   Status New     PT LONG TERM GOAL #3   Title Pt. will participate with [employer] ex. program on a consistent basis with no c/o pain.     Baseline Increase R hip  pain currently with ex. program.    Time 4   Period Weeks   Status New     PT LONG TERM GOAL #4   Title Pt. will complete LEFS and score >50 out of 80 to promote return to gym based ex.    Baseline TBD   Time 4   Period Weeks   Status New             Patient will benefit from skilled therapeutic intervention in order to improve the following deficits and impairments:  Decreased activity tolerance, Decreased endurance, Decreased mobility, Hypomobility, Increased fascial restricitons, Pain, Improper body mechanics, Postural dysfunction, Decreased strength, Impaired flexibility  Visit Diagnosis: Pain in right hip  Muscle weakness (generalized)     Problem List Patient Active Problem List   Diagnosis Date Noted  . Overweight (BMI 25.0-29.9) 10/01/2015  . Vitamin D deficiency 09/18/2014  . Onychomycosis 09/16/2014  . Degenerative disc disease, lumbar 09/16/2014  . Benign essential HTN  10/10/2010  . OP (osteoporosis) 10/10/2010  . Type 2 diabetes mellitus, uncontrolled, with neuropathy ([place]) 10/10/2010   Pura Spice, PT, DPT # [phone] 03/05/2016, 5:49 PM  [facility] [address]. [place], [place], [address] Phone: [phone]   Fax:  [phone]  Name: Melinda Ford MRN: [id number] Date of Birth: September 24, 1945

## 2016-03-02 ENCOUNTER — Other Ambulatory Visit: Payer: Self-pay

## 2016-03-02 ENCOUNTER — Telehealth: Payer: Self-pay | Admitting: Family Medicine

## 2016-03-02 NOTE — Telephone Encounter (Signed)
Please put in the order for A1C to be done at [place] [facility] please

## 2016-03-02 NOTE — Telephone Encounter (Signed)
Ordered

## 2016-03-03 ENCOUNTER — Other Ambulatory Visit (INDEPENDENT_AMBULATORY_CARE_PROVIDER_SITE_OTHER): Payer: PPO

## 2016-03-03 DIAGNOSIS — Z794 Long term (current) use of insulin: Secondary | ICD-10-CM

## 2016-03-03 DIAGNOSIS — E118 Type 2 diabetes mellitus with unspecified complications: Secondary | ICD-10-CM

## 2016-03-03 DIAGNOSIS — E1165 Type 2 diabetes mellitus with hyperglycemia: Secondary | ICD-10-CM

## 2016-03-03 LAB — BASIC METABOLIC PANEL
BUN: 22 mg/dL (ref 6–23)
CO2: 27 mEq/L (ref 19–32)
Calcium: 10.6 mg/dL — ABNORMAL HIGH (ref 8.4–10.5)
Chloride: 105 mEq/L (ref 96–112)
Creatinine, Ser: 0.64 mg/dL (ref 0.40–1.20)
GFR: 117.49 mL/min (ref >60.00)
GLUCOSE: 123 mg/dL — AB (ref 70–99)
POTASSIUM: 4.6 meq/L (ref 3.5–5.1)
Sodium: 140 mEq/L (ref 135–145)

## 2016-03-03 LAB — LIPID PANEL
CHOL/HDL RATIO: 2
Cholesterol: 217 mg/dL — ABNORMAL HIGH (ref 0–200)
HDL: 104.4 mg/dL (ref >39.00)
LDL Cholesterol: 100 mg/dL — ABNORMAL HIGH (ref 0–99)
NONHDL: 112.44
Triglycerides: 60 mg/dL (ref 0.0–149.0)
VLDL: 12 mg/dL (ref 0.0–40.0)

## 2016-03-03 LAB — HEMOGLOBIN A1C: HEMOGLOBIN A1C: 7.5 % — AB (ref 4.6–6.5)

## 2016-03-05 NOTE — Addendum Note (Signed)
Addended by: Pura Spice on: 03/05/2016 05:55 PM   Modules accepted: Orders

## 2016-03-07 ENCOUNTER — Encounter: Payer: Self-pay | Admitting: Endocrinology

## 2016-03-07 ENCOUNTER — Ambulatory Visit: Payer: PPO | Attending: Physical Medicine & Rehabilitation | Admitting: Physical Therapy

## 2016-03-07 ENCOUNTER — Ambulatory Visit (INDEPENDENT_AMBULATORY_CARE_PROVIDER_SITE_OTHER): Payer: PPO | Admitting: Endocrinology

## 2016-03-07 VITALS — BP 120/68 | HR 69 | Ht 62.0 in | Wt 155.0 lb

## 2016-03-07 DIAGNOSIS — E1165 Type 2 diabetes mellitus with hyperglycemia: Secondary | ICD-10-CM | POA: Diagnosis not present

## 2016-03-07 DIAGNOSIS — Z794 Long term (current) use of insulin: Secondary | ICD-10-CM | POA: Diagnosis not present

## 2016-03-07 NOTE — Patient Instructions (Addendum)
Click 1 time for  123456 grams of Carbs  Change at same time  No calcium supplements

## 2016-03-07 NOTE — Progress Notes (Signed)
Patient ID: Melinda Ford, female   DOB: 01/28/1946, 70 y.o.   MRN: [id number]            Reason for Appointment: Follow-up for Type 2 Diabetes   History of Present Illness:          Date of diagnosis of type 2 diabetes mellitus: ?  2004        Background history:   She previously had been on metformin and also Avandia initially Detailed records of her care are not available prior to 2013 or so She was tried on Januvia in 2016 but this was not effective and this was stopped in 5/70 She was started on insulin in 2/16 with small doses of Lantus insulin Her A1c has been consistently over 8% since about 2013  Recent history:   INSULIN regimen is:   V-go 20 unit pump since 12/23/15, boluses 4--0-9 clicks with meals     Non-insulin hypoglycemic drugs the patient is taking are: metformin 500 mg 2 in pm  Her A1c is now 7.5, previously 9.3 twice  Current management, blood sugar patterns and problems identified:  Since her last visit her blood sugars have been quite somewhat better although still somewhat erratic.  She now says that she did not continue to change her pump at the same time and because she thought she was getting extra insulin with the leftover boluses she would wait until sometimes 36 hours to change the pump and is not changing it at a consistent time now  Also did not realize that she needs to take boluses for snacks and she may be eating more carbohydrate later at night including a half sandwich  Her  time on the monitor is not accurate, probably about 2 hours ahead  She has not had steroids but seems to be gaining weight  On an average her blood sugars are better than the last visit  Still not able to do much exercise although had been going to the gym previously       Side effects from medications have been: nausea and diarrhea from 1000 MG metformin  Compliance with the medical regimen: fair Hypoglycemia:  level  Glucose monitoring:  done 3 times a day          Glucometer: One Touch Verio now        Blood Glucose readings by download for the last 2 weeks:  Mean values apply above for all meters except median for One Touch  PRE-MEAL Fasting Lunch Dinner Overnight  Overall  Glucose range: 65-178  104-362  79-236  89-391    Mean/median: 126  176  166  187  158     Self-care:   Meal times are:  Breakfast is at 9-10 AM, dinner 7-8  Typical meal intake: Breakfast is toast, oatmeal, bacon, milk   Lunch: Sometimes hot dogs, fish sandwich Evening meal is  chicken, steak, pork chop, corn, greens.           Dietician visit, most recent: 2013 CDE visit: 12/2015               Exercise:  none recently  Weight history:  Wt Readings from Last 3 Encounters:  03/07/16 155 lb (70.3 kg)  02/29/16 145 lb (65.8 kg)  02/04/16 148 lb (67.1 kg)    Glycemic control:   Lab Results  Component Value Date   HGBA1C 7.5 (H) 03/03/2016   HGBA1C 9.3 (H) 11/02/2015   HGBA1C 9.3 (H) 09/10/2015  Lab Results  Component Value Date   LDLCALC 100 (H) 03/03/2016   CREATININE 0.64 03/03/2016   Lab Results  Component Value Date   MICRALBCREAT 4.0 09/10/2015    Other active problems: See review of systems     Medication List       Accurate as of 03/07/16 12:28 PM. Always use your most recent med list.          acetaminophen 500 MG tablet Commonly known as:  TYLENOL Take 1,000 mg by mouth 2 (two) times daily.   amLODipine 10 MG tablet Commonly known as:  NORVASC Take 1 tablet (10 mg total) by mouth daily.   aspirin 81 MG tablet Take 1 tablet (81 mg total) by mouth daily.   cyclobenzaprine 10 MG tablet Commonly known as:  FLEXERIL Take 10 mg by mouth 3 (three) times daily as needed for muscle spasms.   GINKGO BILOBA COMPLEX PO Take by mouth.   glucosamine-chondroitin 500-400 MG tablet Take 1 tablet by mouth 3 (three) times daily.   glucose blood test strip Commonly known as:  ONETOUCH VERIO Use to test blood sugar 4 times daily     ibuprofen 400 MG tablet Commonly known as:  ADVIL,MOTRIN Take 1 tablet (400 mg total) by mouth every 6 (six) hours as needed.   insulin aspart 100 UNIT/ML injection Commonly known as:  NOVOLOG 75 units per the v-go pump.   INSULIN SYRINGE .3CC/31GX5/16" 31G X 5/16" 0.3 ML Misc Inject 1 Units as directed 2 (two) times daily.   losartan 100 MG tablet Commonly known as:  COZAAR Take 1 tablet (100 mg total) by mouth daily.   metFORMIN 500 MG tablet Commonly known as:  GLUCOPHAGE Take 1 tablet (500 mg total) by mouth 2 (two) times daily with a meal.   ONETOUCH VERIO w/Device Kit 1 each by Does not apply route 4 (four) times daily.   oxycodone-acetaminophen 2.5-325 MG tablet Commonly known as:  PERCOCET Take 1 tablet by mouth every 6 (six) hours as needed for pain.   ReliOn Ultra Thin Lancets Misc Inject 1 Units as directed 3 (three) times daily as needed.   V-GO 20 Kit Use 1 per day   Vitamin D 2000 units Caps Take 1 capsule (2,000 Units total) by mouth daily.       Allergies: No Known Allergies  Past Medical History:  Diagnosis Date  . Allergy   . Arthritis   . Cataract   . Diabetes mellitus without complication ([place])   . Hypertension   . Osteoporosis     Past Surgical History:  Procedure Laterality Date  . ABDOMINAL HYSTERECTOMY      Family History  Problem Relation Age of Onset  . Breast cancer Other 40  . Alcohol abuse Father   . Mental illness Father   . Stroke Father   . Diabetes Sister   . Asthma Brother   . Diabetes Brother   . Diabetes Maternal Aunt   . Heart disease Maternal Aunt   . Diabetes Maternal Uncle   . Early death Maternal Uncle   . Breast cancer Cousin 46    Social History:  reports that she has never smoked. She has never used smokeless tobacco. She reports that she drinks about 0.6 oz of alcohol per week . She reports that she does not use drugs.   Review of Systems  Lipid history: Not on Any treatment, LDL  borderline Followed by PCP  Lab Results  Component Value Date   CHOL  217 (H) 03/03/2016   HDL 104.40 03/03/2016   LDLCALC 100 (H) 03/03/2016   TRIG 60.0 03/03/2016   CHOLHDL 2 03/03/2016           Hypertension: on treatment for 15 yrs, currently on amlodipine and losartan  BP Readings from Last 3 Encounters:  03/07/16 120/68  02/29/16 (!) 175/68  01/25/16 128/60     Most recent eye exam was in 10/16, no retinopathy reported  Most recent foot exam: 9/17  She appears to have a high calcium, usually not high, she is taking some extra vitamins with calcium especially for her joints and bones, does not know how his calcium she is taking  Lab Results  Component Value Date   CALCIUM 10.6 (H) 03/03/2016     LABS:  Lab on 03/03/2016  Component Date Value Ref Range Status  . Sodium 03/03/2016 140  135 - 145 mEq/L Final  . Potassium 03/03/2016 4.6  3.5 - 5.1 mEq/L Final  . Chloride 03/03/2016 105  96 - 112 mEq/L Final  . CO2 03/03/2016 27  19 - 32 mEq/L Final  . Glucose, Bld 03/03/2016 123* 70 - 99 mg/dL Final  . BUN 03/03/2016 22  6 - 23 mg/dL Final  . Creatinine, Ser 03/03/2016 0.64  0.40 - 1.20 mg/dL Final  . Calcium 03/03/2016 10.6* 8.4 - 10.5 mg/dL Final  . GFR 03/03/2016 117.49  >60.00 mL/min Final  . Cholesterol 03/03/2016 217* 0 - 200 mg/dL Final  . Triglycerides 03/03/2016 60.0  0.0 - 149.0 mg/dL Final  . HDL 03/03/2016 104.40  >39.00 mg/dL Final  . VLDL 03/03/2016 12.0  0.0 - 40.0 mg/dL Final  . LDL Cholesterol 03/03/2016 100* 0 - 99 mg/dL Final  . Total CHOL/HDL Ratio 03/03/2016 2   Final  . NonHDL 03/03/2016 112.44   Final  . Hgb A1c MFr Bld 03/03/2016 7.5* 4.6 - 6.5 % Final    Physical Examination:  BP 120/68   Pulse 69   Ht 5' 2" (1.575 m)   Wt 155 lb (70.3 kg)   SpO2 96%   BMI 28.35 kg/m           ASSESSMENT:  Diabetes type 2, uncontrolled  On insulin  On the regimen of V-go pump her blood sugars appear to be improving A1c is now 7.5 Her  blood sugars are however tending to be high postprandially She is also not understanding the need to change her V-go pump every 24 hours Explained in detail that the pump will not deliver any more basal insulin after 24 hours and discussed in detail the differences between basal and bolus Explained to her that she does have extra bolus capacity in her pump. She can still be more consistent with diet and snacks and is gaining weight Discussed the need to cover all meals and also snacks with boluses even if she takes 2 units Her meter was programmed to the right time. She will try to check readings consistently about 2 hours after meals especially suppertime  HYPERCALCEMIA: This is relatively new and not clear of the etiology, maybe lab error.  However she is getting more vitamins and calcium recently and will need to reevaluate vitamin D level Meanwhile she will stop taking any calcium supplements  PLAN:  as above      Patient Instructions  Click 1 time for  15-72 grams of Carbs  Change at same time  No calcium supplements     Counseling time on subjects discussed above is over  50% of today's 25 minute visit   Melinda Ford 03/07/2016, 12:28 PM   Note: This office note was prepared with Estate agent. Any transcriptional errors that result from this process are unintentional.

## 2016-03-08 ENCOUNTER — Ambulatory Visit (INDEPENDENT_AMBULATORY_CARE_PROVIDER_SITE_OTHER): Payer: PPO | Admitting: Family Medicine

## 2016-03-08 ENCOUNTER — Encounter: Payer: Self-pay | Admitting: Family Medicine

## 2016-03-08 VITALS — BP 126/82 | HR 82 | Temp 98.6°F | Resp 16 | Ht 62.0 in | Wt 155.0 lb

## 2016-03-08 DIAGNOSIS — M5431 Sciatica, right side: Secondary | ICD-10-CM

## 2016-03-08 DIAGNOSIS — E1165 Type 2 diabetes mellitus with hyperglycemia: Secondary | ICD-10-CM | POA: Diagnosis not present

## 2016-03-08 DIAGNOSIS — E1140 Type 2 diabetes mellitus with diabetic neuropathy, unspecified: Secondary | ICD-10-CM | POA: Diagnosis not present

## 2016-03-08 DIAGNOSIS — I10 Essential (primary) hypertension: Secondary | ICD-10-CM | POA: Diagnosis not present

## 2016-03-08 DIAGNOSIS — E663 Overweight: Secondary | ICD-10-CM

## 2016-03-08 DIAGNOSIS — E559 Vitamin D deficiency, unspecified: Secondary | ICD-10-CM | POA: Diagnosis not present

## 2016-03-08 DIAGNOSIS — IMO0002 Reserved for concepts with insufficient information to code with codable children: Secondary | ICD-10-CM

## 2016-03-09 DIAGNOSIS — M5431 Sciatica, right side: Secondary | ICD-10-CM

## 2016-03-09 HISTORY — DX: Sciatica, right side: M54.31

## 2016-03-09 NOTE — Progress Notes (Signed)
Date:  03/08/2016   Name:  Melinda Ford   DOB:  [birth year]   MRN:  [id number]  PCP:  Adline Potter, MD    Chief Complaint: Driving Paperwork   History of Present Illness:  This is a 70 y.o. female seen for six month f/u. Had foot fx in July and seen ED twice in August for R leg pain felt sciatica, still bothering, takes Percocet prn. Seeing endo for T2DM, on V-Go pump, metformin, and Novolog with improved control. Needs DMV form completed, endo filled out his part. Taking vit D, ginko, and G/C for OA.  Review of Systems:  Review of Systems  Constitutional: Negative for appetite change and fever.  Respiratory: Negative for cough and shortness of breath.   Cardiovascular: Negative for chest pain and leg swelling.  Endocrine: Negative for polyuria.  Genitourinary: Negative for difficulty urinating.  Neurological: Negative for dizziness and syncope.    Patient Active Problem List   Diagnosis Date Noted  . Overweight (BMI 25.0-29.9) 10/01/2015  . Vitamin D deficiency 09/18/2014  . Onychomycosis 09/16/2014  . Degenerative disc disease, lumbar 09/16/2014  . Benign essential HTN 10/10/2010  . OP (osteoporosis) 10/10/2010  . Type 2 diabetes mellitus, uncontrolled, with neuropathy ([place]) 10/10/2010    Prior to Admission medications   Medication Sig Start Date End Date Taking? Authorizing Provider  acetaminophen (TYLENOL) 500 MG tablet Take 1,000 mg by mouth 2 (two) times daily.   Yes Historical Provider, MD  amLODipine (NORVASC) 10 MG tablet Take 1 tablet (10 mg total) by mouth daily. 12/27/15 12/26/16 Yes Juline Patch, MD  Blood Glucose Monitoring Suppl (ONETOUCH VERIO) w/Device KIT 1 each by Does not apply route 4 (four) times daily. 01/18/16  Yes Elayne Snare, MD  Cholecalciferol (VITAMIN D) 2000 UNITS CAPS Take 1 capsule (2,000 Units total) by mouth daily. 09/18/14  Yes Adline Potter, MD  GINKGO BILOBA COMPLEX PO Take by mouth.   Yes Historical Provider, MD  glucosamine-chondroitin  500-400 MG tablet Take 1 tablet by mouth 3 (three) times daily.   Yes Historical Provider, MD  glucose blood (ONETOUCH VERIO) test strip Use to test blood sugar 4 times daily 01/18/16  Yes Elayne Snare, MD  insulin aspart (NOVOLOG) 100 UNIT/ML injection 75 units per the v-go pump. 01/14/16  Yes Philemon Kingdom, MD  Insulin Disposable Pump (V-GO 20) KIT Use 1 per day 01/11/16  Yes Elayne Snare, MD  Insulin Syringe-Needle U-100 (INSULIN SYRINGE .3CC/31GX5/16") 31G X 5/16" 0.3 ML MISC Inject 1 Units as directed 2 (two) times daily. 01/27/15  Yes Adline Potter, MD  losartan (COZAAR) 100 MG tablet Take 1 tablet (100 mg total) by mouth daily. 12/27/15 12/27/16 Yes Juline Patch, MD  metFORMIN (GLUCOPHAGE) 500 MG tablet Take 1 tablet (500 mg total) by mouth 2 (two) times daily with a meal. 12/30/15  Yes Elayne Snare, MD  oxycodone-acetaminophen (PERCOCET) 2.5-325 MG tablet Take 1 tablet by mouth every 6 (six) hours as needed for pain. 02/29/16  Yes Melynda Ripple, MD  ReliOn Ultra Thin Lancets MISC Inject 1 Units as directed 3 (three) times daily as needed. 05/06/14  Yes Historical Provider, MD    No Known Allergies  Past Surgical History:  Procedure Laterality Date  . ABDOMINAL HYSTERECTOMY      Social History  Substance Use Topics  . Smoking status: Never Smoker  . Smokeless tobacco: Never Used  . Alcohol use 0.6 oz/week    1 Standard drinks or equivalent per week    Family  History  Problem Relation Age of Onset  . Breast cancer Other 40  . Alcohol abuse Father   . Mental illness Father   . Stroke Father   . Diabetes Sister   . Asthma Brother   . Diabetes Brother   . Diabetes Maternal Aunt   . Heart disease Maternal Aunt   . Diabetes Maternal Uncle   . Early death Maternal Uncle   . Breast cancer Cousin 76    Medication list has been reviewed and updated.  Physical Examination: BP 126/82   Pulse 82   Temp 98.6 F (37 C)   Resp 16   Ht '5\' 2"'$  (1.575 m)   Wt 155 lb (70.3 kg)    SpO2 98%   BMI 28.35 kg/m   Physical Exam  Constitutional: She appears well-developed and well-nourished.  Cardiovascular: Normal rate, regular rhythm and normal heart sounds.   Pulmonary/Chest: Effort normal and breath sounds normal.  Musculoskeletal: She exhibits no edema.  Neurological: She is alert.  Skin: Skin is warm and dry.  Psychiatric: She has a normal mood and affect. Her behavior is normal.  Nursing note and vitals reviewed.   Assessment and Plan:  1. Benign essential HTN Well controlled on amlodipine  2. Type 2 diabetes mellitus, uncontrolled, with neuropathy (HCC) Improved control, endo following  3. Vitamin D deficiency Well controlled on supplement  4. Overweight (BMI 25.0-29.9) Exercise/weight loss discussed  5. RLE pain May be related to diabetic neuropathy, avoid NSAID, use Percocet sparingly  6. DMV form completed, needs to include optho eval  Return in about 6 months (around 09/06/2016).  Melinda Ford. [facility]  03/09/2016

## 2016-03-24 ENCOUNTER — Telehealth: Payer: Self-pay | Admitting: Endocrinology

## 2016-03-24 NOTE — Telephone Encounter (Signed)
Calcium supplements

## 2016-03-24 NOTE — Telephone Encounter (Signed)
Which medicine is she supposed to stop taking?

## 2016-03-24 NOTE — Progress Notes (Signed)
Please schedule follow-up appointment in 2 months with labs

## 2016-03-24 NOTE — Telephone Encounter (Signed)
LM for pt to call back to let her know of this note as well as get her scheduled for a follow up per lab note

## 2016-04-04 NOTE — Telephone Encounter (Signed)
Attempted to contact the pt again with no success

## 2016-04-21 ENCOUNTER — Other Ambulatory Visit: Payer: Self-pay

## 2016-04-23 NOTE — Progress Notes (Signed)
Eye exam entry only

## 2016-04-24 ENCOUNTER — Other Ambulatory Visit: Payer: Self-pay | Admitting: Family Medicine

## 2016-04-24 ENCOUNTER — Telehealth: Payer: Self-pay | Admitting: Endocrinology

## 2016-04-24 ENCOUNTER — Other Ambulatory Visit: Payer: Self-pay | Admitting: Endocrinology

## 2016-04-24 DIAGNOSIS — Z1231 Encounter for screening mammogram for malignant neoplasm of breast: Secondary | ICD-10-CM

## 2016-04-24 NOTE — Telephone Encounter (Signed)
Patient want her lab orders sent to [place] station.

## 2016-04-24 NOTE — Telephone Encounter (Signed)
Pt is asking what relion she is to take? Also pt is concerned with what will be ok to put in her pump.  The vgo is also not going to cover anymore is there a PA that can be done?

## 2016-04-24 NOTE — Telephone Encounter (Signed)
Patient is asking which insulin to take the Relion N, Relion R, or the 70/30. which one do she take.   Please advise  Only have a days worth.  [facility] [address] [place] - [place] [phone] (Phone) [phone] (Fax)

## 2016-04-24 NOTE — Telephone Encounter (Signed)
Relion R can be used in V-go. She should ask insurance if they will accept PA

## 2016-04-25 NOTE — Telephone Encounter (Signed)
Pt is aware of the relion r being ok to use. Also she is contacting vgo and the insurance directly

## 2016-04-26 ENCOUNTER — Other Ambulatory Visit: Payer: Self-pay

## 2016-04-26 MED ORDER — INSULIN REGULAR HUMAN 100 UNIT/ML IJ SOLN: INTRAMUSCULAR | 5 refills | Status: DC

## 2016-05-01 ENCOUNTER — Ambulatory Visit
Admission: RE | Admit: 2016-05-01 | Discharge: 2016-05-01 | Disposition: A | Discharge status: Home / Self Care | Payer: PPO | Source: Ambulatory Visit | Attending: Family Medicine | Admitting: Family Medicine

## 2016-05-01 DIAGNOSIS — Z1231 Encounter for screening mammogram for malignant neoplasm of breast: Secondary | ICD-10-CM | POA: Diagnosis not present

## 2016-05-08 ENCOUNTER — Telehealth: Payer: Self-pay

## 2016-05-08 ENCOUNTER — Other Ambulatory Visit: Payer: Self-pay | Admitting: Family Medicine

## 2016-05-08 MED ORDER — OSELTAMIVIR PHOSPHATE 75 MG PO CAPS: 75.0000 mg | ORAL_CAPSULE | Freq: Every day | ORAL | 1 refills | Status: DC

## 2016-05-08 NOTE — Progress Notes (Signed)
Tamiflu prophy given as caring for family member with flu in hospital

## 2016-05-08 NOTE — Telephone Encounter (Signed)
Rx sent. Take until no longer being exposed to flu.

## 2016-05-08 NOTE — Telephone Encounter (Signed)
Father diagnosed with Flu and she is caring for him. ER advised her to take Tamiflu as precaution. I explained all doctors do not send this in as precaution and she said she has diabetes and has been all over him caring for him and he is in hospital but she is by his side and they were afraid she will get the flu. Please let me know as she will be checking with [facility] before leaving to hospital.

## 2016-05-09 DIAGNOSIS — H900 Conductive hearing loss, bilateral: Secondary | ICD-10-CM | POA: Diagnosis not present

## 2016-05-09 DIAGNOSIS — H6123 Impacted cerumen, bilateral: Secondary | ICD-10-CM | POA: Diagnosis not present

## 2016-05-10 ENCOUNTER — Other Ambulatory Visit: Payer: Self-pay | Admitting: Family Medicine

## 2016-05-11 ENCOUNTER — Telehealth: Payer: Self-pay

## 2016-05-11 NOTE — Telephone Encounter (Signed)
Pharm sent fax concerning Meloxicam 15mg  qday #30.Marland KitchenMarland KitchenIf Pt can have, please send in.

## 2016-05-11 NOTE — Telephone Encounter (Signed)
Declined refill yesterday

## 2016-05-16 ENCOUNTER — Other Ambulatory Visit: Payer: Self-pay | Admitting: Family Medicine

## 2016-05-16 DIAGNOSIS — I10 Essential (primary) hypertension: Secondary | ICD-10-CM

## 2016-05-17 ENCOUNTER — Telehealth: Payer: Self-pay | Admitting: *Deleted

## 2016-05-17 NOTE — Telephone Encounter (Signed)
Pt will have labs drawn at this office

## 2016-05-18 ENCOUNTER — Other Ambulatory Visit (INDEPENDENT_AMBULATORY_CARE_PROVIDER_SITE_OTHER): Payer: PPO

## 2016-05-18 DIAGNOSIS — Z794 Long term (current) use of insulin: Secondary | ICD-10-CM

## 2016-05-18 DIAGNOSIS — E1165 Type 2 diabetes mellitus with hyperglycemia: Secondary | ICD-10-CM

## 2016-05-18 LAB — COMPREHENSIVE METABOLIC PANEL
ALT: 18 U/L (ref 0–35)
AST: 18 U/L (ref 0–37)
Albumin: 3.8 g/dL (ref 3.5–5.2)
Alkaline Phosphatase: 57 U/L (ref 39–117)
BUN: 21 mg/dL (ref 6–23)
CHLORIDE: 108 meq/L (ref 96–112)
CO2: 28 meq/L (ref 19–32)
Calcium: 9.5 mg/dL (ref 8.4–10.5)
Creatinine, Ser: 0.64 mg/dL (ref 0.40–1.20)
GFR: 117.42 mL/min (ref >60.00)
GLUCOSE: 119 mg/dL — AB (ref 70–99)
POTASSIUM: 4.3 meq/L (ref 3.5–5.1)
SODIUM: 138 meq/L (ref 135–145)
TOTAL PROTEIN: 6.7 g/dL (ref 6.0–8.3)
Total Bilirubin: 0.4 mg/dL (ref 0.2–1.2)

## 2016-05-18 LAB — VITAMIN D 25 HYDROXY (VIT D DEFICIENCY, FRACTURES): VITD: 24.92 ng/mL — ABNORMAL LOW (ref 30.00–100.00)

## 2016-05-18 LAB — HEMOGLOBIN A1C: HEMOGLOBIN A1C: 7 % — AB (ref 4.6–6.5)

## 2016-05-23 ENCOUNTER — Ambulatory Visit: Payer: PPO | Admitting: Endocrinology

## 2016-05-23 ENCOUNTER — Encounter: Payer: Self-pay | Admitting: Endocrinology

## 2016-05-23 ENCOUNTER — Ambulatory Visit (INDEPENDENT_AMBULATORY_CARE_PROVIDER_SITE_OTHER): Payer: PPO | Admitting: Endocrinology

## 2016-05-23 VITALS — BP 138/70 | HR 68 | Ht 62.0 in | Wt 155.0 lb

## 2016-05-23 DIAGNOSIS — E1165 Type 2 diabetes mellitus with hyperglycemia: Secondary | ICD-10-CM

## 2016-05-23 DIAGNOSIS — Z794 Long term (current) use of insulin: Secondary | ICD-10-CM | POA: Diagnosis not present

## 2016-05-23 NOTE — Patient Instructions (Addendum)
Take 1 Metformin at lunch and supper daily  Vitamin D3, [id number] units daily  Click [id number] min before meals  Take 1 click for snack

## 2016-05-23 NOTE — Progress Notes (Signed)
Patient ID: Melinda Ford, female   DOB: December 25, 1945, 71 y.o.   MRN: [id number]            Reason for Appointment: Follow-up for Type 2 Diabetes   History of Present Illness:          Date of diagnosis of type 2 diabetes mellitus: ?  2004        Background history:   She previously had been on metformin and also Avandia initially Detailed records of her care are not available prior to 2013 or so She was tried on Januvia in 2016 but this was not effective and this was stopped in 5/70 She was started on insulin in 2/16 with small doses of Lantus insulin Her A1c has been consistently over 8% since about 2013  Recent history:   INSULIN regimen is:   V-go 20 unit pump since 12/23/15, boluses 3--4-3/3 clicks with meals     Non-insulin hypoglycemic drugs the patient is taking are: metformin 500 mg 0-2 in pm  Her A1c is now 7.0, has been as high as 9.3  Current management, blood sugar patterns and problems identified:  Since her last visit her blood sugars have been lower and probably not fluctuating as much  She is changing her V-go pump around 11 PM every night with the help of her husband.  Her blood sugars are fairly good overall except has significant variability at bedtime  She now says that she is usually having some snacks late in the evening, sometimes this will be like peanuts.  She does not bolus for her snack at night.  She thinks she is eating a more complete meal at lunchtime and somewhat less at suppertime but is somewhat unclear about how much bolus she is taking for her supper meal  She switched from NovoLog to Novolin R from [place] and is unable to comply with taking the bolus 30 minutes before eating, if she goes longer she will feel hypoglycemic  She is somewhat unclear if she is taking her bolus consistently with every meal but usually taking a bolus when she starts eating  She now says that she will take her metformin at night only based on her blood sugar  reading as she thinks it may cause low sugars  Still not able to do much exercise       Side effects from medications have been: nausea and diarrhea from 1000 MG metformin  Compliance with the medical regimen: fair Hypoglycemia:  level  Glucose monitoring:  done 3 times a day         Glucometer: One Touch Verio         Blood Glucose readings by download for the last 2 weeks:  Mean values apply above for all meters except median for One Touch  PRE-MEAL Fasting Lunch Dinner Bedtime Overall  Glucose range: 68-196  103-162  54-286  106-293    Mean/median: 109  133  170  188  138     Self-care:   Meal times are:  Breakfast is at 9-10 AM, dinner 7-8  Typical meal intake: Breakfast is toast, oatmeal, bacon, milk   Lunch: Sometimes hot dogs, fish sandwich Evening meal is  chicken, steak, pork chop, corn, greens.           Dietician visit, most recent: 2013 CDE visit: 12/2015               Exercise:  none   Weight history:  Wt Readings from Last  3 Encounters:  05/23/16 155 lb (70.3 kg)  03/08/16 155 lb (70.3 kg)  03/07/16 155 lb (70.3 kg)    Glycemic control:   Lab Results  Component Value Date   HGBA1C 7.0 (H) 05/18/2016   HGBA1C 7.5 (H) 03/03/2016   HGBA1C 9.3 (H) 11/02/2015   Lab Results  Component Value Date   LDLCALC 100 (H) 03/03/2016   CREATININE 0.64 05/18/2016   Lab Results  Component Value Date   MICRALBCREAT 4.0 09/10/2015    Other active problems: See review of systems   Allergies as of 05/23/2016   No Known Allergies     Medication List       Accurate as of 05/23/16  3:36 PM. Always use your most recent med list.          acetaminophen 500 MG tablet Commonly known as:  TYLENOL Take 1,000 mg by mouth 2 (two) times daily.   amLODipine 10 MG tablet Commonly known as:  NORVASC Take 1 tablet (10 mg total) by mouth daily.   GINKGO BILOBA COMPLEX PO Take by mouth.   glucosamine-chondroitin 500-400 MG tablet Take 1 tablet by mouth 3  (three) times daily.   glucose blood test strip Commonly known as:  ONETOUCH VERIO Use to test blood sugar 4 times daily   insulin aspart 100 UNIT/ML injection Commonly known as:  NOVOLOG 75 units per the v-go pump.   insulin regular 250 units/2.85m (100 units/mL) injection Commonly known as:  NOVOLIN R 75 units per the V-GO PUMP   INSULIN SYRINGE .3CC/31GX5/16" 31G X 5/16" 0.3 ML Misc Inject 1 Units as directed 2 (two) times daily.   losartan 100 MG tablet Commonly known as:  COZAAR Take 1 tablet (100 mg total) by mouth daily.   metFORMIN 500 MG tablet Commonly known as:  GLUCOPHAGE Take 1 tablet (500 mg total) by mouth 2 (two) times daily with a meal.   ONETOUCH VERIO w/Device Kit 1 each by Does not apply route 4 (four) times daily.   oseltamivir 75 MG capsule Commonly known as:  TAMIFLU Take 1 capsule (75 mg total) by mouth daily.   oxycodone-acetaminophen 2.5-325 MG tablet Commonly known as:  PERCOCET Take 1 tablet by mouth every 6 (six) hours as needed for pain.   ReliOn Ultra Thin Lancets Misc Inject 1 Units as directed 3 (three) times daily as needed.   V-GO 20 Kit USE ONE PER DAY   Vitamin D 2000 units Caps Take 1 capsule (2,000 Units total) by mouth daily.       Allergies: No Known Allergies  Past Medical History:  Diagnosis Date  . Allergy   . Arthritis   . Cataract   . Diabetes mellitus without complication ([place]   . Hypertension   . Osteoporosis     Past Surgical History:  Procedure Laterality Date  . ABDOMINAL HYSTERECTOMY      Family History  Problem Relation Age of Onset  . Breast cancer Other 40  . Alcohol abuse Father   . Mental illness Father   . Stroke Father   . Diabetes Sister   . Asthma Brother   . Diabetes Brother   . Diabetes Maternal Aunt   . Heart disease Maternal Aunt   . Diabetes Maternal Uncle   . Early death Maternal Uncle   . Breast cancer Cousin 538   mat cousin    Social History:  reports that she has  never smoked. She has never used smokeless tobacco. She reports that  she drinks about 0.6 oz of alcohol per week . She reports that she does not use drugs.   Review of Systems  Lipid history: Not on Any treatment, LDL borderline Followed by PCP  Lab Results  Component Value Date   CHOL 217 (H) 03/03/2016   HDL 104.40 03/03/2016   LDLCALC 100 (H) 03/03/2016   TRIG 60.0 03/03/2016   CHOLHDL 2 03/03/2016           Hypertension: on treatment for 15 yrs, currently on amlodipine and losartan  BP Readings from Last 3 Encounters:  05/23/16 138/70  03/08/16 126/82  03/07/16 120/68     Most recent eye exam was in 10/16, no retinopathy reported  Most recent foot exam: 9/17  She appears to have a high calcium, usually not high, she is taking some extra vitamins with calcium especially for her joints and bones, does not know how  calcium she is taking  Lab Results  Component Value Date   CALCIUM 9.5 05/18/2016     LABS:  Lab on 05/18/2016  Component Date Value Ref Range Status  . Hgb A1c MFr Bld 05/18/2016 7.0* 4.6 - 6.5 % Final  . Sodium 05/18/2016 138  135 - 145 mEq/L Final  . Potassium 05/18/2016 4.3  3.5 - 5.1 mEq/L Final  . Chloride 05/18/2016 108  96 - 112 mEq/L Final  . CO2 05/18/2016 28  19 - 32 mEq/L Final  . Glucose, Bld 05/18/2016 119* 70 - 99 mg/dL Final  . BUN 05/18/2016 21  6 - 23 mg/dL Final  . Creatinine, Ser 05/18/2016 0.64  0.40 - 1.20 mg/dL Final  . Total Bilirubin 05/18/2016 0.4  0.2 - 1.2 mg/dL Final  . Alkaline Phosphatase 05/18/2016 57  39 - 117 U/L Final  . AST 05/18/2016 18  0 - 37 U/L Final  . ALT 05/18/2016 18  0 - 35 U/L Final  . Total Protein 05/18/2016 6.7  6.0 - 8.3 g/dL Final  . Albumin 05/18/2016 3.8  3.5 - 5.2 g/dL Final  . Calcium 05/18/2016 9.5  8.4 - 10.5 mg/dL Final  . GFR 05/18/2016 117.42  >60.00 mL/min Final  . VITD 05/18/2016 24.92* 30.00 - 100.00 ng/mL Final    Physical Examination:  BP 138/70   Pulse 68   Ht '5\' 2"'$  (1.575  m)   Wt 155 lb (70.3 kg)   SpO2 97%   BMI 28.35 kg/m           ASSESSMENT:  Diabetes type 2, uncontrolled  On insulin See history of present illness for detailed discussion of current diabetes management, blood sugar patterns and problems identified  On the regimen of V-go pump her A1c is now down to 7 which is excellent This is despite her having somewhat erratic blood sugars after her evening meal She does not appear to be bolusing for any snacks she will have later in the evening and can have readings as high as 293 Although she is not exercising her weight has leveled off Overall is doing well with the routine for managing her V-go pump except she cannot remember to take the bolus 30 min before eating with using the regular insulin now   HYPERCALCEMIA: Appears to be resolved, not clear if this is related to previous calcium supplements  PLAN:    Discussed day-to-day management with the V-go pump and explained how the insulin is consumed or both basal and bolus compartments Discussed boluses as far as timing, adjustment of dosage and need for  taking more for higher carbohydrate or high-fat meal She will usually need one click if she is having a snack late in the evening For convenience she can try to bolus 10-15 minutes before eating She will try to check readings 2 hours after eating more often Also she can split the metformin to lunch and dinner and if she still has diarrhea she can switch to extended release metformin  She can take 1000 units of vitamin D3 for her mildly decreased level, calcium is normal now  Patient Instructions  Take 1 Metformin at lunch and supper daily  Vitamin D3, 7841 units daily  Click 28-20 min before meals  Take 1 click for snack      Counseling time on subjects discussed above is over 50% of today's 25 minute visit   Graceann Boileau 05/23/2016, 3:36 PM   Note: This office note was prepared with Dragon voice recognition system technology.  Any transcriptional errors that result from this process are unintentional.

## 2016-05-24 NOTE — Telephone Encounter (Signed)
02/07/16 Pt. Called and was told that she needs to wash her hands with soap and water, or use alcohol before each blood sugar reading.  And when she gets a very low or high reading, to rewash her hands and retest her blood sugar.  She agreed to do this

## 2016-05-26 ENCOUNTER — Other Ambulatory Visit: Payer: Self-pay | Admitting: Family Medicine

## 2016-05-26 ENCOUNTER — Telehealth: Payer: Self-pay

## 2016-05-26 DIAGNOSIS — I10 Essential (primary) hypertension: Secondary | ICD-10-CM

## 2016-05-26 MED ORDER — LOSARTAN POTASSIUM 100 MG PO TABS: 100.0000 mg | ORAL_TABLET | Freq: Every day | ORAL | 3 refills | Status: DC

## 2016-05-26 MED ORDER — AMLODIPINE BESYLATE 10 MG PO TABS: 10.0000 mg | ORAL_TABLET | Freq: Every day | ORAL | 3 refills | Status: DC

## 2016-05-26 NOTE — Telephone Encounter (Signed)
Losartan and amlodipine rxs sent

## 2016-05-26 NOTE — Telephone Encounter (Signed)
Patient said her mail order sent refill request for Losartan and Amlodipine and we sent back that she is no longer our patient. I told her we declined meloxicam but never got any other request. She then said he needs her meloxicam for knee pain and I told her she needs OV so she will be here Tues. Can we send Losartan and Amlodipine in?

## 2016-05-30 ENCOUNTER — Ambulatory Visit (INDEPENDENT_AMBULATORY_CARE_PROVIDER_SITE_OTHER): Payer: PPO | Admitting: Family Medicine

## 2016-05-30 ENCOUNTER — Encounter: Payer: Self-pay | Admitting: Family Medicine

## 2016-05-30 VITALS — BP 154/82 | HR 82 | Resp 16 | Ht 62.0 in | Wt 157.0 lb

## 2016-05-30 DIAGNOSIS — M1611 Unilateral primary osteoarthritis, right hip: Secondary | ICD-10-CM | POA: Insufficient documentation

## 2016-05-30 DIAGNOSIS — M5431 Sciatica, right side: Secondary | ICD-10-CM

## 2016-05-30 DIAGNOSIS — E559 Vitamin D deficiency, unspecified: Secondary | ICD-10-CM | POA: Diagnosis not present

## 2016-05-30 DIAGNOSIS — I10 Essential (primary) hypertension: Secondary | ICD-10-CM

## 2016-05-30 DIAGNOSIS — E1165 Type 2 diabetes mellitus with hyperglycemia: Secondary | ICD-10-CM

## 2016-05-30 DIAGNOSIS — R0683 Snoring: Secondary | ICD-10-CM

## 2016-05-30 DIAGNOSIS — M25551 Pain in right hip: Secondary | ICD-10-CM | POA: Diagnosis not present

## 2016-05-30 DIAGNOSIS — IMO0002 Reserved for concepts with insufficient information to code with codable children: Secondary | ICD-10-CM

## 2016-05-30 DIAGNOSIS — E1140 Type 2 diabetes mellitus with diabetic neuropathy, unspecified: Secondary | ICD-10-CM

## 2016-05-30 MED ORDER — LOSARTAN POTASSIUM 100 MG PO TABS: 100.0000 mg | ORAL_TABLET | Freq: Every day | ORAL | 1 refills | Status: DC

## 2016-05-30 MED ORDER — GABAPENTIN 100 MG PO CAPS: 100.0000 mg | ORAL_CAPSULE | Freq: Every day | ORAL | 2 refills | Status: DC

## 2016-05-30 NOTE — Progress Notes (Signed)
Date:  05/30/2016   Name:  Melinda Ford   DOB:  03/29/1946   MRN:  [id number]  PCP:  Melinda Plonk, Ford    Chief Complaint: Hypertension   History of Present Illness:  This is a 71 y.o. female seen in one month f/u. Ran out of losartan 2d ago, waiting for mail order refills. Endo following T2DM, much improved on insulin pump. Was seeing [facility] for R hip pain, told OA on MRI but no R hip OA on XR, fims do show some mild lumbar DDD and OA R knee. Insurance made switch to Dr. Kernodle, has not seen yet. C/o severe snoring, wakes up husband, had negative sleep study 10 yrs ago but snoring worse since then, also some daytime sleepiness/fatigue. C/o daily aching pain in R thigh and calf, Tylenol helps some, Percocet helps a lot but wants to avoid regular use. Vit D level low 2 wks ago, on supplement per endo.  Review of Systems:  Review of Systems  Constitutional: Negative for chills and fever.  HENT: Negative for sore throat and trouble swallowing.   Respiratory: Negative for cough and shortness of breath.   Cardiovascular: Negative for chest pain and leg swelling.  Gastrointestinal: Negative for abdominal pain.  Endocrine: Negative for polydipsia and polyuria.  Neurological: Negative for tremors, syncope and light-headedness.    Patient Active Problem List   Diagnosis Date Noted  . Snoring 05/30/2016  . Osteoarthritis of right hip 05/30/2016  . Right sided sciatica 03/09/2016  . Overweight (BMI 25.0-29.9) 10/01/2015  . Vitamin D deficiency 09/18/2014  . Onychomycosis 09/16/2014  . Degenerative disc disease, lumbar 09/16/2014  . Benign essential HTN 10/10/2010  . OP (osteoporosis) 10/10/2010  . Type 2 diabetes mellitus, uncontrolled, with neuropathy (H[facility]) 10/10/2010    Prior to Admission medications   Medication Sig Start Date End Date Taking? Authorizing Provider  acetaminophen (TYLENOL) 500 MG tablet Take 1,000 mg by mouth 2 (two) times daily.   Yes Historical Provider, Ford   amLODipine (NORVASC) 10 MG tablet Take 1 tablet (10 mg total) by mouth daily. 05/26/16 05/26/17 Yes Melinda Plonk, Ford  Blood Glucose Monitoring Suppl (ONETOUCH VERIO) w/Device KIT 1 each by Does not apply route 4 (four) times daily. 01/18/16  Yes Ajay Kumar, Ford  Cholecalciferol (VITAMIN D) 2000 UNITS CAPS Take 1 capsule (2,000 Units total) by mouth daily. 09/18/14  Yes Melinda Plonk, Ford  GINKGO BILOBA COMPLEX PO Take by mouth.   Yes Historical Provider, Ford  glucosamine-chondroitin 500-400 MG tablet Take 1 tablet by mouth 3 (three) times daily.   Yes Historical Provider, Ford  glucose blood (ONETOUCH VERIO) test strip Use to test blood sugar 4 times daily 01/18/16  Yes Ajay Kumar, Ford  insulin aspart (NOVOLOG) 100 UNIT/ML injection 75 units per the v-go pump. 01/14/16  Yes Cristina Gherghe, Ford  Insulin Disposable Pump (V-GO 20) KIT USE ONE PER DAY 04/24/16  Yes Ajay Kumar, Ford  insulin regular (NOVOLIN R) 250 units/2.5mL (100 units/mL) injection 75 units per the V-GO PUMP 04/26/16  Yes Ajay Kumar, Ford  Insulin Syringe-Needle U-100 (INSULIN SYRINGE .3CC/31GX5/16") 31G X 5/16" 0.3 ML MISC Inject 1 Units as directed 2 (two) times daily. 01/27/15  Yes Melinda Plonk, Ford  losartan (COZAAR) 100 MG tablet Take 1 tablet (100 mg total) by mouth daily. 05/30/16 05/31/17 Yes Melinda Plonk, Ford  metFORMIN (GLUCOPHAGE) 500 MG tablet Take 1 tablet (500 mg total) by mouth 2 (two) times daily with a meal. 12/30/15  Yes Ajay Kumar,   Ford  oxycodone-acetaminophen (PERCOCET) 2.5-325 MG tablet Take 1 tablet by mouth every 6 (six) hours as needed for pain. 02/29/16  Yes Ashley Mortenson, Ford  ReliOn Ultra Thin Lancets MISC Inject 1 Units as directed 3 (three) times daily as needed. 05/06/14  Yes Historical Provider, Ford  gabapentin (NEURONTIN) 100 MG capsule Take 1 capsule (100 mg total) by mouth at bedtime. 05/30/16   Melinda Plonk, Ford    No Known Allergies  Past Surgical History:  Procedure Laterality Date  . ABDOMINAL HYSTERECTOMY       Social History  Substance Use Topics  . Smoking status: Never Smoker  . Smokeless tobacco: Never Used  . Alcohol use 0.6 oz/week    1 Standard drinks or equivalent per week    Family History  Problem Relation Age of Onset  . Breast cancer Other 40  . Alcohol abuse Father   . Mental illness Father   . Stroke Father   . Diabetes Sister   . Asthma Brother   . Diabetes Brother   . Diabetes Maternal Aunt   . Heart disease Maternal Aunt   . Diabetes Maternal Uncle   . Early death Maternal Uncle   . Breast cancer Cousin 54    mat cousin    Medication list has been reviewed and updated.  Physical Examination: BP (!) 154/82 Comment: out of meds  Pulse 82   Resp 16   Ht 5' 2" (1.575 m)   Wt 157 lb (71.2 kg)   SpO2 97%   BMI 28.72 kg/m   Physical Exam  Constitutional: She is oriented to person, place, and time. She appears well-developed and well-nourished.  Cardiovascular: Normal rate, regular rhythm and normal heart sounds.   Pulmonary/Chest: Effort normal and breath sounds normal.  Musculoskeletal: She exhibits no edema.  Neurological: She is alert and oriented to person, place, and time.  Skin: Skin is warm and dry.  Psychiatric: She has a normal mood and affect. Her behavior is normal.  Nursing note and vitals reviewed.   Assessment and Plan:  1. Benign essential HTN Poor control today off losartan, refill x 1 wk until mail order arrives - losartan (COZAAR) 100 MG tablet; Take 1 tablet (100 mg total) by mouth daily.  Dispense: 7 tablet; Refill: 1  2. Type 2 diabetes mellitus, uncontrolled, with neuropathy (HCC) Improved control on insulin pump, followed by endo  3. Snoring Possible sleep apnea, consider ENT referral if sleep study normal - Ambulatory referral to Sleep Studies  4. Right sided sciatica Likely neuropathic, trial gabapentin qhs  5. Right hip pain Unclear etiology, to see Dr. [facility]ern[facility]le for further eval  6. Vitamin D deficiency On  supplement  Return in about 4 weeks (around 06/27/2016).  Melinda Ford [facility]  05/30/2016  

## 2016-06-22 DIAGNOSIS — M5417 Radiculopathy, lumbosacral region: Secondary | ICD-10-CM | POA: Diagnosis not present

## 2016-06-22 HISTORY — DX: Radiculopathy, lumbosacral region: M54.17

## 2016-06-27 ENCOUNTER — Ambulatory Visit: Payer: PPO | Admitting: Family Medicine

## 2016-07-12 ENCOUNTER — Telehealth: Payer: Self-pay

## 2016-07-12 NOTE — Telephone Encounter (Signed)
Patient husband is getting an at home sleep study done and she wants to know if she can. She wants to know is it better at hospital or do you mind if you refer her to in house sleep study? [facility] do them I think as does [facility]. Let me know tomorrow and I will call her back.

## 2016-07-12 NOTE — Telephone Encounter (Signed)
I'm ok with home sleep study, maybe same company husband is using?

## 2016-07-13 ENCOUNTER — Encounter: Payer: Self-pay | Admitting: Family Medicine

## 2016-07-13 ENCOUNTER — Ambulatory Visit (INDEPENDENT_AMBULATORY_CARE_PROVIDER_SITE_OTHER): Payer: PPO | Admitting: Family Medicine

## 2016-07-13 VITALS — BP 148/84 | HR 64 | Temp 98.1°F | Resp 16 | Ht 62.0 in | Wt 157.0 lb

## 2016-07-13 DIAGNOSIS — J302 Other seasonal allergic rhinitis: Secondary | ICD-10-CM | POA: Diagnosis not present

## 2016-07-13 DIAGNOSIS — E1140 Type 2 diabetes mellitus with diabetic neuropathy, unspecified: Secondary | ICD-10-CM | POA: Diagnosis not present

## 2016-07-13 DIAGNOSIS — IMO0002 Reserved for concepts with insufficient information to code with codable children: Secondary | ICD-10-CM

## 2016-07-13 DIAGNOSIS — I10 Essential (primary) hypertension: Secondary | ICD-10-CM

## 2016-07-13 DIAGNOSIS — E1165 Type 2 diabetes mellitus with hyperglycemia: Secondary | ICD-10-CM

## 2016-07-13 DIAGNOSIS — R0683 Snoring: Secondary | ICD-10-CM | POA: Diagnosis not present

## 2016-07-13 DIAGNOSIS — J309 Allergic rhinitis, unspecified: Secondary | ICD-10-CM | POA: Insufficient documentation

## 2016-07-13 DIAGNOSIS — J029 Acute pharyngitis, unspecified: Secondary | ICD-10-CM | POA: Diagnosis not present

## 2016-07-13 LAB — POCT RAPID STREP A (OFFICE): RAPID STREP A SCREEN: NEGATIVE

## 2016-07-13 MED ORDER — LORATADINE 10 MG PO TABS: 10.0000 mg | ORAL_TABLET | Freq: Every day | ORAL | 0 refills | Status: DC | PRN

## 2016-07-13 NOTE — Progress Notes (Signed)
Date:  07/13/2016   Name:  Melinda Ford   DOB:  [birth year]   MRN:  [id number]  PCP:  Adline Potter, MD    Chief Complaint: Sore Throat (3-4 days ) and Apnea (wants to know if she can do sleep study at home can we order it again for at home study. )   History of Present Illness:  This is a 71 y.o. female seen for two month f/u. c/o sore throat x 4d with postnasal drip and sinus congestion. Using Benedryl prn itching. Scheduled for sleep study at hospital but wonders if can get at home. Saw Dr. Jefm Bryant for R L/S radiculopathy, stable. Endo following for DM, much improved on insulin pump.  Review of Systems:  Review of Systems  Constitutional: Negative for chills and fever.  Respiratory: Negative for cough and shortness of breath.   Cardiovascular: Negative for chest pain and leg swelling.  Endocrine: Negative for polydipsia and polyuria.  Genitourinary: Negative for difficulty urinating.  Neurological: Negative for syncope and light-headedness.    Patient Active Problem List   Diagnosis Date Noted  . Allergic rhinitis 07/13/2016  . Snoring 05/30/2016  . Right sided sciatica 03/09/2016  . Overweight (BMI 25.0-29.9) 10/01/2015  . Vitamin D deficiency 09/18/2014  . Onychomycosis 09/16/2014  . Degenerative disc disease, lumbar 09/16/2014  . Benign essential HTN 10/10/2010  . OP (osteoporosis) 10/10/2010  . Type 2 diabetes mellitus, uncontrolled, with neuropathy ([place]) 10/10/2010    Prior to Admission medications   Medication Sig Start Date End Date Taking? Authorizing Provider  acetaminophen (TYLENOL) 500 MG tablet Take 1,000 mg by mouth 2 (two) times daily.   Yes Historical Provider, MD  amLODipine (NORVASC) 10 MG tablet Take 1 tablet (10 mg total) by mouth daily. 05/26/16 05/26/17 Yes Adline Potter, MD  Blood Glucose Monitoring Suppl (ONETOUCH VERIO) w/Device KIT 1 each by Does not apply route 4 (four) times daily. 01/18/16  Yes Elayne Snare, MD  Cholecalciferol (VITAMIN D) 2000  UNITS CAPS Take 1 capsule (2,000 Units total) by mouth daily. 09/18/14  Yes Adline Potter, MD  gabapentin (NEURONTIN) 100 MG capsule Take 1 capsule (100 mg total) by mouth at bedtime. 05/30/16  Yes Adline Potter, MD  GINKGO BILOBA COMPLEX PO Take by mouth.   Yes Historical Provider, MD  glucosamine-chondroitin 500-400 MG tablet Take 1 tablet by mouth 3 (three) times daily.   Yes Historical Provider, MD  glucose blood (ONETOUCH VERIO) test strip Use to test blood sugar 4 times daily 01/18/16  Yes Elayne Snare, MD  insulin aspart (NOVOLOG) 100 UNIT/ML injection 75 units per the v-go pump. 01/14/16  Yes Philemon Kingdom, MD  Insulin Disposable Pump (V-GO 20) KIT USE ONE PER DAY 04/24/16  Yes Elayne Snare, MD  insulin regular (NOVOLIN R) 250 units/2.34m (100 units/mL) injection 75 units per the V-GO PUMP 04/26/16  Yes AElayne Snare MD  Insulin Syringe-Needle U-100 (INSULIN SYRINGE .3CC/31GX5/16") 31G X 5/16" 0.3 ML MISC Inject 1 Units as directed 2 (two) times daily. 01/27/15  Yes WAdline Potter MD  losartan (COZAAR) 100 MG tablet Take 1 tablet (100 mg total) by mouth daily. 05/30/16 05/31/17 Yes WAdline Potter MD  metFORMIN (GLUCOPHAGE) 500 MG tablet Take 1 tablet (500 mg total) by mouth 2 (two) times daily with a meal. 12/30/15  Yes AElayne Snare MD  ReliOn Ultra Thin Lancets MISC Inject 1 Units as directed 3 (three) times daily as needed. 05/06/14  Yes Historical Provider, MD  loratadine (CLARITIN) 10 MG tablet Take 1 tablet (  10 mg total) by mouth daily as needed for allergies. 07/13/16   Adline Potter, MD    No Known Allergies  Past Surgical History:  Procedure Laterality Date  . ABDOMINAL HYSTERECTOMY      Social History  Substance Use Topics  . Smoking status: Never Smoker  . Smokeless tobacco: Never Used  . Alcohol use 0.6 oz/week    1 Standard drinks or equivalent per week    Family History  Problem Relation Age of Onset  . Breast cancer Other 40  . Alcohol abuse Father   . Mental illness Father    . Stroke Father   . Diabetes Sister   . Asthma Brother   . Diabetes Brother   . Diabetes Maternal Aunt   . Heart disease Maternal Aunt   . Diabetes Maternal Uncle   . Early death Maternal Uncle   . Breast cancer Cousin 49    mat cousin    Medication list has been reviewed and updated.  Physical Examination: BP (!) 148/84   Pulse 64   Temp 98.1 F (36.7 C) (Oral)   Resp 16   Ht _0  (1.575 m)   Wt 157 lb (71.2 kg)   SpO2 98%   BMI 28.72 kg/m   Physical Exam  Constitutional: She appears well-developed and well-nourished.  HENT:  OP mod erythema no exudate  Cardiovascular: Normal rate, regular rhythm and normal heart sounds.   Pulmonary/Chest: Effort normal and breath sounds normal.  Musculoskeletal: She exhibits no edema.  Lymphadenopathy:    She has no cervical adenopathy.  Neurological: She is alert.  Skin: Skin is warm and dry.  Psychiatric: She has a normal mood and affect. Her behavior is normal.  Nursing note and vitals reviewed.   Assessment and Plan:  1. Sore throat RST negative, likely allergic - POCT rapid strep A  2. Chronic seasonal allergic rhinitis, unspecified trigger OTC Claritin daily, avoid Benedryl  3. Type 2 diabetes mellitus, uncontrolled, with neuropathy (HCC) Improved control on insulin pump, followed by endo  4. Benign essential HTN Marginal control today, cont amlodipine/losartan  5. Snoring Advised sleep study at hospital for CPAP titration if needed  Return if symptoms worsen or fail to improve.  [name]. [facility]  07/13/2016

## 2016-07-13 NOTE — Patient Instructions (Signed)
Take Claritin or Zyrtec (generic ok) instead of Benedryl for allergy symptoms

## 2016-07-14 NOTE — Telephone Encounter (Signed)
Stayed with sleep med

## 2016-07-19 DIAGNOSIS — E1142 Type 2 diabetes mellitus with diabetic polyneuropathy: Secondary | ICD-10-CM | POA: Diagnosis not present

## 2016-07-19 DIAGNOSIS — B351 Tinea unguium: Secondary | ICD-10-CM | POA: Diagnosis not present

## 2016-07-29 ENCOUNTER — Other Ambulatory Visit: Payer: Self-pay | Admitting: Endocrinology

## 2016-07-31 ENCOUNTER — Telehealth: Payer: Self-pay | Admitting: Endocrinology

## 2016-07-31 NOTE — Telephone Encounter (Signed)
vgo 20 needs to be called into [facility] [address] [phone]

## 2016-07-31 NOTE — Telephone Encounter (Signed)
I contacted the patient and advised rx was submitted on 07/29/2016. Patient voiced understanding.

## 2016-08-14 DIAGNOSIS — M5136 Other intervertebral disc degeneration, lumbar region: Secondary | ICD-10-CM | POA: Diagnosis not present

## 2016-08-14 DIAGNOSIS — M5416 Radiculopathy, lumbar region: Secondary | ICD-10-CM | POA: Diagnosis not present

## 2016-08-14 DIAGNOSIS — M47816 Spondylosis without myelopathy or radiculopathy, lumbar region: Secondary | ICD-10-CM | POA: Diagnosis not present

## 2016-08-17 ENCOUNTER — Ambulatory Visit: Payer: PPO | Attending: Neurology

## 2016-08-17 DIAGNOSIS — R0683 Snoring: Secondary | ICD-10-CM | POA: Insufficient documentation

## 2016-08-17 DIAGNOSIS — G4733 Obstructive sleep apnea (adult) (pediatric): Secondary | ICD-10-CM | POA: Insufficient documentation

## 2016-08-17 DIAGNOSIS — G4730 Sleep apnea, unspecified: Secondary | ICD-10-CM | POA: Diagnosis not present

## 2016-08-22 ENCOUNTER — Ambulatory Visit: Payer: PPO | Admitting: Endocrinology

## 2016-08-23 ENCOUNTER — Encounter: Payer: Self-pay | Admitting: Family Medicine

## 2016-08-23 DIAGNOSIS — G4733 Obstructive sleep apnea (adult) (pediatric): Secondary | ICD-10-CM

## 2016-08-23 HISTORY — DX: Obstructive sleep apnea (adult) (pediatric): G47.33

## 2016-09-06 ENCOUNTER — Telehealth: Payer: Self-pay

## 2016-09-06 NOTE — Telephone Encounter (Signed)
Patient confused. Was told need to do second sleep study for titration. She is being asked what is her titration and what severity or stage is her sleep apnea?

## 2016-09-12 ENCOUNTER — Telehealth: Payer: Self-pay | Admitting: Family Medicine

## 2016-09-12 NOTE — Telephone Encounter (Signed)
Patient called about sleep study results- pt states that she would need to schedule the next sleep study after finding out results for the first one-Please call patient.

## 2016-09-12 NOTE — Telephone Encounter (Signed)
Sleep study shows OSA, needs CPAP titration.Marland Kitchen

## 2016-09-13 NOTE — Telephone Encounter (Signed)
Ok thanks 

## 2016-09-13 NOTE — Telephone Encounter (Signed)
Notified her of next step- she should call the place in which she had the sleep study and they will send you an order to sign for CPAP titration

## 2016-09-19 ENCOUNTER — Telehealth: Payer: Self-pay | Admitting: Family Medicine

## 2016-09-19 ENCOUNTER — Telehealth: Payer: Self-pay | Admitting: Endocrinology

## 2016-09-19 NOTE — Telephone Encounter (Signed)
Patient would like to know if Dr. Dwyane Dee wants her to come in before July to be seen for a follow up. Patient had to cancel tomorrow's appointment and rescheduled.

## 2016-09-19 NOTE — Telephone Encounter (Signed)
Patient called to cancel an appointment however, call ended. No appointments were cancelled on my behalf.

## 2016-09-20 ENCOUNTER — Encounter: Payer: Self-pay | Admitting: Family Medicine

## 2016-09-20 ENCOUNTER — Other Ambulatory Visit: Payer: Self-pay

## 2016-09-20 ENCOUNTER — Ambulatory Visit: Payer: Self-pay | Admitting: Family Medicine

## 2016-09-20 ENCOUNTER — Ambulatory Visit (INDEPENDENT_AMBULATORY_CARE_PROVIDER_SITE_OTHER): Payer: PPO | Admitting: Family Medicine

## 2016-09-20 ENCOUNTER — Ambulatory Visit: Payer: PPO | Admitting: Endocrinology

## 2016-09-20 VITALS — BP 158/84 | HR 78 | Resp 16 | Ht 62.0 in | Wt 159.0 lb

## 2016-09-20 DIAGNOSIS — G4733 Obstructive sleep apnea (adult) (pediatric): Secondary | ICD-10-CM

## 2016-09-20 DIAGNOSIS — E559 Vitamin D deficiency, unspecified: Secondary | ICD-10-CM | POA: Diagnosis not present

## 2016-09-20 DIAGNOSIS — E1165 Type 2 diabetes mellitus with hyperglycemia: Secondary | ICD-10-CM | POA: Diagnosis not present

## 2016-09-20 DIAGNOSIS — B360 Pityriasis versicolor: Secondary | ICD-10-CM | POA: Diagnosis not present

## 2016-09-20 DIAGNOSIS — E663 Overweight: Secondary | ICD-10-CM | POA: Diagnosis not present

## 2016-09-20 DIAGNOSIS — M5136 Other intervertebral disc degeneration, lumbar region: Secondary | ICD-10-CM | POA: Diagnosis not present

## 2016-09-20 DIAGNOSIS — E1140 Type 2 diabetes mellitus with diabetic neuropathy, unspecified: Secondary | ICD-10-CM

## 2016-09-20 DIAGNOSIS — I10 Essential (primary) hypertension: Secondary | ICD-10-CM

## 2016-09-20 DIAGNOSIS — IMO0002 Reserved for concepts with insufficient information to code with codable children: Secondary | ICD-10-CM

## 2016-09-20 MED ORDER — KETOCONAZOLE 2 % EX CREA: 1.0000 "application " | TOPICAL_CREAM | Freq: Every day | CUTANEOUS | 0 refills | Status: DC

## 2016-09-20 MED ORDER — MELOXICAM 15 MG PO TABS: 15.0000 mg | ORAL_TABLET | Freq: Every day | ORAL | 0 refills | Status: DC | PRN

## 2016-09-20 NOTE — Telephone Encounter (Signed)
Can you please schedule this patient ASAP? Thank you so very much!!

## 2016-09-20 NOTE — Telephone Encounter (Signed)
LVM for Pt to call and schedule earlier appointment in July.

## 2016-09-20 NOTE — Telephone Encounter (Signed)
She has not been seen for 4 months, need to be seen at the earliest available appointment

## 2016-09-20 NOTE — Telephone Encounter (Signed)
I do not see this refill in the system or that we ever wrote this so I left message to have her call us. Will need appt. I called at 20 and she was not home.

## 2016-09-20 NOTE — Telephone Encounter (Signed)
Patient called wanting some medication (ketoconazole) for itching around her neck, she said she has had it prescribed before. She mentioned if you could please call her by 11:30.

## 2016-09-20 NOTE — Telephone Encounter (Signed)
Please advise if needs to come in before July.

## 2016-09-22 NOTE — Progress Notes (Signed)
Date:  09/20/2016   Name:  Melinda Ford   DOB:  11/03/1945   MRN:  [id number]  PCP:  Adline Potter, MD    Chief Complaint: Rash (dryness and itching 3 weeks on chest and shoulder and arms. Has used a cream in past Ket.... cream that helped with this but we have never given it to her. )   History of Present Illness:  This is a 71 y.o. female seen for four month f/u. Sleep study showed OSA, needs CPAP titration. T2DM well controlled on metformin/Lantus/insulin pump per pt, endo/podiatry following. HTN on amlodipine/losartan. Requests refill Mobic, using prn only for severe pain, on Tylenol 1000 mg bid. Off Celexa, on ginkgo, doing well. On vit D supp. C/o pruritic rash over neck and chest, ketoconazole cream helped in past  Review of Systems:  Review of Systems  Constitutional: Negative for appetite change, chills and fever.  Respiratory: Negative for cough and shortness of breath.   Cardiovascular: Negative for chest pain and leg swelling.  Endocrine: Negative for polydipsia and polyuria.  Genitourinary: Negative for difficulty urinating.  Neurological: Negative for syncope and light-headedness.    Patient Active Problem List   Diagnosis Date Noted  . Primary localized osteoarthrosis of ankle and foot 09/20/2016  . Tinea versicolor 09/20/2016  . OSA (obstructive sleep apnea) 08/23/2016  . Allergic rhinitis 07/13/2016  . Radiculopathy of lumbosacral region 06/22/2016  . Snoring 05/30/2016  . Right sided sciatica 03/09/2016  . Lumbar spondylosis 12/02/2015  . Overweight (BMI 25.0-29.9) 10/01/2015  . Vitamin D deficiency 09/18/2014  . Onychomycosis 09/16/2014  . Degenerative disc disease, lumbar 09/16/2014  . Benign essential HTN 10/10/2010  . Osteoporosis 10/10/2010  . Type 2 diabetes mellitus, uncontrolled, with neuropathy ([place]) 10/10/2010    Prior to Admission medications   Medication Sig Start Date End Date Taking? Authorizing Provider  acetaminophen (TYLENOL) 500 MG  tablet Take 1,000 mg by mouth 2 (two) times daily.   Yes [provider]  amLODipine (NORVASC) 10 MG tablet amlodipine 10 mg tablet   Yes [provider]  Blood Glucose Monitoring Suppl (ONETOUCH VERIO) w/Device KIT 1 each by Does not apply route 4 (four) times daily. 01/18/16  Yes Elayne Snare, MD  Cholecalciferol (VITAMIN D) 2000 UNITS CAPS Take 1 capsule (2,000 Units total) by mouth daily. 09/18/14  Yes Plonk, Gwyndolyn Saxon, MD  gabapentin (NEURONTIN) 100 MG capsule Take 1 capsule (100 mg total) by mouth at bedtime. 05/30/16  Yes Plonk, Gwyndolyn Saxon, MD  Evelina Bucy BILOBA COMPLEX PO Take by mouth.   Yes [provider]  glucose blood (ONETOUCH VERIO) test strip Use to test blood sugar 4 times daily 01/18/16  Yes Elayne Snare, MD  insulin aspart (NOVOLOG) 100 UNIT/ML injection Novolog U-100 Insulin aspart 100 unit/mL subcutaneous solution   Yes [provider]  Insulin Disposable Pump (V-GO 20) KIT USE ONE PER DAY 07/29/16  Yes Elayne Snare, MD  insulin glargine (LANTUS) 100 UNIT/ML injection Lantus U-100 Insulin 100 unit/mL subcutaneous solution   Yes [provider]  Insulin Syringe-Needle U-100 (INSULIN SYRINGE .3CC/31GX5/16") 31G X 5/16" 0.3 ML MISC Inject 1 Units as directed 2 (two) times daily. 01/27/15  Yes Plonk, Gwyndolyn Saxon, MD  loratadine (CLARITIN) 10 MG tablet Take 1 tablet (10 mg total) by mouth daily as needed for allergies. 07/13/16  Yes Plonk, Gwyndolyn Saxon, MD  losartan (COZAAR) 100 MG tablet Take 1 tablet (100 mg total) by mouth daily. 05/30/16 05/31/17 Yes Plonk, Gwyndolyn Saxon, MD  meloxicam (MOBIC) 15 MG tablet Take 1  tablet (15 mg total) by mouth daily as needed for pain. 09/20/16  Yes Plonk, Gwyndolyn Saxon, MD  metFORMIN (GLUCOPHAGE) 500 MG tablet Take 1 tablet (500 mg total) by mouth 2 (two) times daily with a meal. 12/30/15  Yes Elayne Snare, MD  ReliOn Ultra Thin Lancets MISC Inject 1 Units as directed 3 (three) times daily as needed. 05/06/14  Yes [provider]   ketoconazole (NIZORAL) 2 % cream Apply 1 application topically daily. 09/20/16   Adline Potter, MD    No Known Allergies  Past Surgical History:  Procedure Laterality Date  . ABDOMINAL HYSTERECTOMY      Social History  Substance Use Topics  . Smoking status: Never Smoker  . Smokeless tobacco: Never Used  . Alcohol use 0.6 oz/week    1 Standard drinks or equivalent per week    Family History  Problem Relation Age of Onset  . Breast cancer Other 40  . Alcohol abuse Father   . Mental illness Father   . Stroke Father   . Diabetes Sister   . Asthma Brother   . Diabetes Brother   . Diabetes Maternal Aunt   . Heart disease Maternal Aunt   . Diabetes Maternal Uncle   . Early death Maternal Uncle   . Breast cancer Cousin 59       mat cousin    Medication list has been reviewed and updated.  Physical Examination: BP (!) 158/84   Pulse 78   Resp 16   Ht 5' 2" (1.575 m)   Wt 159 lb (72.1 kg)   SpO2 97%   BMI 29.08 kg/m   Physical Exam  Constitutional: She appears well-developed and well-nourished.  Cardiovascular: Normal rate, regular rhythm and normal heart sounds.   Pulmonary/Chest: Effort normal and breath sounds normal.  Musculoskeletal: She exhibits no edema.  Neurological: She is alert.  Skin: Skin is warm and dry.  Scaly plaques over upper chest and neck  Psychiatric: She has a normal mood and affect. Her behavior is normal.  Nursing note and vitals reviewed.   Assessment and Plan:  1. Type 2 diabetes mellitus, uncontrolled, with neuropathy (HCC) On metformin, Lantus, insulin pump, followed by endo  2. Benign essential HTN Marginal control, begin CPAP, continue amlodipine/losartan, consider diuretic if unimproved next visit  3. OSA (obstructive sleep apnea) CPAP titration scheduled  4. Tinea versicolor Ketoconazole cream prn  5. Degenerative disc disease, lumbar Refill Mobic for prn use only, cont Tylenol bid, followed by ortho  6. Overweight  (BMI 25.0-29.9) Exercise/weight loss discussed  7. Vitamin D deficiency On supplement, followed by endo  Return in about 4 weeks (around 10/18/2016).  [name]. [facility]  09/22/2016

## 2016-10-02 ENCOUNTER — Ambulatory Visit: Payer: Self-pay

## 2016-10-10 DIAGNOSIS — M79674 Pain in right toe(s): Secondary | ICD-10-CM | POA: Diagnosis not present

## 2016-10-10 DIAGNOSIS — B351 Tinea unguium: Secondary | ICD-10-CM | POA: Diagnosis not present

## 2016-10-10 DIAGNOSIS — M79675 Pain in left toe(s): Secondary | ICD-10-CM | POA: Diagnosis not present

## 2016-10-10 DIAGNOSIS — E1142 Type 2 diabetes mellitus with diabetic polyneuropathy: Secondary | ICD-10-CM | POA: Diagnosis not present

## 2016-10-17 DIAGNOSIS — M5416 Radiculopathy, lumbar region: Secondary | ICD-10-CM | POA: Diagnosis not present

## 2016-10-17 DIAGNOSIS — M5136 Other intervertebral disc degeneration, lumbar region: Secondary | ICD-10-CM | POA: Diagnosis not present

## 2016-10-17 DIAGNOSIS — M47816 Spondylosis without myelopathy or radiculopathy, lumbar region: Secondary | ICD-10-CM | POA: Diagnosis not present

## 2016-10-26 ENCOUNTER — Other Ambulatory Visit: Payer: Self-pay | Admitting: Endocrinology

## 2016-10-27 ENCOUNTER — Encounter: Payer: Self-pay | Admitting: Endocrinology

## 2016-10-27 ENCOUNTER — Ambulatory Visit (INDEPENDENT_AMBULATORY_CARE_PROVIDER_SITE_OTHER): Payer: PPO | Admitting: Endocrinology

## 2016-10-27 VITALS — BP 132/74 | HR 67 | Ht 62.0 in | Wt 161.8 lb

## 2016-10-27 DIAGNOSIS — Z794 Long term (current) use of insulin: Secondary | ICD-10-CM | POA: Diagnosis not present

## 2016-10-27 DIAGNOSIS — I10 Essential (primary) hypertension: Secondary | ICD-10-CM | POA: Diagnosis not present

## 2016-10-27 DIAGNOSIS — E1165 Type 2 diabetes mellitus with hyperglycemia: Secondary | ICD-10-CM | POA: Diagnosis not present

## 2016-10-27 LAB — COMPREHENSIVE METABOLIC PANEL
ALBUMIN: 4 g/dL (ref 3.5–5.2)
ALK PHOS: 59 U/L (ref 39–117)
ALT: 22 U/L (ref 0–35)
AST: 19 U/L (ref 0–37)
BILIRUBIN TOTAL: 0.5 mg/dL (ref 0.2–1.2)
BUN: 25 mg/dL — ABNORMAL HIGH (ref 6–23)
CALCIUM: 10.3 mg/dL (ref 8.4–10.5)
CHLORIDE: 107 meq/L (ref 96–112)
CO2: 25 mEq/L (ref 19–32)
CREATININE: 0.69 mg/dL (ref 0.40–1.20)
GFR: 107.52 mL/min (ref >60.00)
Glucose, Bld: 136 mg/dL — ABNORMAL HIGH (ref 70–99)
Potassium: 4.4 mEq/L (ref 3.5–5.1)
Sodium: 138 mEq/L (ref 135–145)
Total Protein: 7.2 g/dL (ref 6.0–8.3)

## 2016-10-27 LAB — POCT GLYCOSYLATED HEMOGLOBIN (HGB A1C): Hemoglobin A1C: 6.8

## 2016-10-27 LAB — MICROALBUMIN / CREATININE URINE RATIO
CREATININE, U: 119.8 mg/dL
MICROALB UR: 0.9 mg/dL (ref 0.0–1.9)
Microalb Creat Ratio: 0.8 mg/g (ref 0.0–30.0)

## 2016-10-27 MED ORDER — V-GO 20 KIT: PACK | 2 refills | Status: DC

## 2016-10-27 MED ORDER — METFORMIN HCL ER 500 MG PO TB24: 500.0000 mg | ORAL_TABLET | Freq: Two times a day (BID) | ORAL | 3 refills | Status: DC

## 2016-10-27 NOTE — Progress Notes (Signed)
Patient ID: Melinda Ford, female   DOB: [birth year], 71 y.o.   MRN: [id number]            Reason for Appointment: Follow-up for Type 2 Diabetes   History of Present Illness:          Date of diagnosis of type 2 diabetes mellitus: ?  2004        Background history:   She previously had been on metformin and also Avandia initially Detailed records of her care are not available prior to 2013 or so She was tried on Januvia in 2016 but this was not effective and this was stopped in 5/70 She was started on insulin in 2/16 with small doses of Lantus insulin Her A1c has been consistently over 8% since about 2013  Recent history:   INSULIN regimen is:   V-go 20 unit pump since 12/23/15, boluse 4--0-9 clicks with meals     Non-insulin hypoglycemic drugs the patient is taking are: metformin 500 mg 1-2   Her A1c is now 6.8, previously 7.0, has been as high as 9.3  Current management, blood sugar patterns and problems identified:  He is continued to use the V-go pump but has not been followed up regularly  She is again taking the Regular Insulin from [facility] and not always taking the bolus 30 minutes before eating  HIGHEST blood sugars are at bedtime which are usually related to getting extra snacks, occasionally high-fat late at night which will periodically raise her blood sugar over 200  Otherwise appears to have relatively good readings 2-3 hours after supper  She is still somewhat confused about how to do boluses for different meals and not sure how much he needs to take including when her blood sugar is normal before eating  Her BREAKFAST may be sometimes low carbohydrate with eating only eggs.  Her weight is slightly higher  Again because of various issues she is not able to exercise as much although now starting to go to the gym to do low impact aerobics  She says she has difficulty tolerating metformin and either taking once or twice; apparently has not been given extended  release metformin before       Side effects from medications have been: nausea and diarrhea from 1000 MG metformin  Compliance with the medical regimen: fair Hypoglycemia:  level  Glucose monitoring:  done 3 times a day         Glucometer: One Touch Verio         Blood Glucose readings by download for the last 2 weeks:  Mean values apply above for all meters except median for One Touch  PRE-MEAL Fasting Lunch Dinner Bedtime Overall  Glucose range: 74-1 95    10 5-289  74-  Mean/median: 111  170  110  199  135    Self-care:   Meal times are:  Breakfast is at 9-10 AM, dinner 7-8  Typical meal intake: Breakfast is toast, oatmeal, bacon, milk   Lunch: Sometimes hot dogs, fish sandwich Evening meal is  chicken, steak, pork chop, corn, greens.           Dietician visit, most recent: 2013 CDE visit: 12/2015               Exercise: Gym 2/7  Weight history:  Wt Readings from Last 3 Encounters:  10/27/16 161 lb 12.8 oz (73.4 kg)  09/20/16 159 lb (72.1 kg)  07/13/16 157 lb (71.2 kg)    Glycemic  control:   Lab Results  Component Value Date   HGBA1C 6.8 10/27/2016   HGBA1C 7.0 (H) 05/18/2016   HGBA1C 7.5 (H) 03/03/2016   Lab Results  Component Value Date   LDLCALC 100 (H) 03/03/2016   CREATININE 0.64 05/18/2016   Lab Results  Component Value Date   MICRALBCREAT 4.0 09/10/2015    Other active problems: See review of systems   Allergies as of 10/27/2016   No Known Allergies     Medication List       Accurate as of 10/27/16 11:28 AM. Always use your most recent med list.          acetaminophen 500 MG tablet Commonly known as:  TYLENOL Take 1,000 mg by mouth 2 (two) times daily.   amLODipine 10 MG tablet Commonly known as:  NORVASC amlodipine 10 mg tablet   gabapentin 100 MG capsule Commonly known as:  NEURONTIN Take 1 capsule (100 mg total) by mouth at bedtime.   GINKGO BILOBA COMPLEX PO Take by mouth.   glucose blood test strip Commonly known as:   ONETOUCH VERIO Use to test blood sugar 4 times daily   INSULIN SYRINGE .3CC/31GX5/16" 31G X 5/16" 0.3 ML Misc Inject 1 Units as directed 2 (two) times daily.   ketoconazole 2 % cream Commonly known as:  NIZORAL Apply 1 application topically daily.   loratadine 10 MG tablet Commonly known as:  CLARITIN Take 1 tablet (10 mg total) by mouth daily as needed for allergies.   losartan 100 MG tablet Commonly known as:  COZAAR Take 1 tablet (100 mg total) by mouth daily.   meloxicam 15 MG tablet Commonly known as:  MOBIC Take 1 tablet (15 mg total) by mouth daily as needed for pain.   metFORMIN 500 MG 24 hr tablet Commonly known as:  GLUCOPHAGE-XR Take 1 tablet (500 mg total) by mouth 2 (two) times daily with a meal.   NOVOLOG 100 UNIT/ML injection Generic drug:  insulin aspart Novolog U-100 Insulin aspart 100 unit/mL subcutaneous solution   ONETOUCH VERIO w/Device Kit 1 each by Does not apply route 4 (four) times daily.   ReliOn Ultra Thin Lancets Misc Inject 1 Units as directed 3 (three) times daily as needed.   V-GO 20 Kit USE ONE PER DAY   Vitamin D 2000 units Caps Take 1 capsule (2,000 Units total) by mouth daily.       Allergies: No Known Allergies  Past Medical History:  Diagnosis Date  . Allergy   . Arthritis   . Cataract   . Diabetes mellitus without complication ([place])   . Hypertension   . OSA (obstructive sleep apnea) 08/23/2016  . Osteoporosis     Past Surgical History:  Procedure Laterality Date  . ABDOMINAL HYSTERECTOMY      Family History  Problem Relation Age of Onset  . Breast cancer Other 40  . Alcohol abuse Father   . Mental illness Father   . Stroke Father   . Diabetes Sister   . Asthma Brother   . Diabetes Brother   . Diabetes Maternal Aunt   . Heart disease Maternal Aunt   . Diabetes Maternal Uncle   . Early death Maternal Uncle   . Breast cancer Cousin 18       mat cousin    Social History:  reports that she has never  smoked. She has never used smokeless tobacco. She reports that she drinks about 0.6 oz of alcohol per week . She reports that  she does not use drugs.   Review of Systems  She is asking about taking Tylenol and meloxicam for her leg pain, has been told to have nerve pain and is not tolerating gabapentin well She thinks she gets some relief with Tylenol twice a day and also at times will take meloxicam especially when she is planning to be active She was told by her PCP not to take meloxicam long-term  Lipid history: She is not on Any treatment, LDL borderline Followed by PCP Needs follow-up levels  Lab Results  Component Value Date   CHOL 217 (H) 03/03/2016   HDL 104.40 03/03/2016   LDLCALC 100 (H) 03/03/2016   TRIG 60.0 03/03/2016   CHOLHDL 2 03/03/2016           Hypertension: on treatment for 15 yrs, on amlodipine and losartan, Blood pressure better today  BP Readings from Last 3 Encounters:  10/27/16 132/74  09/20/16 (!) 158/84  07/13/16 (!) 148/84     Most recent foot exam: 9/17  She did have a high calcium, needs follow-up  Lab Results  Component Value Date   CALCIUM 9.5 05/18/2016     LABS:  Office Visit on 10/27/2016  Component Date Value Ref Range Status  . Hemoglobin A1C 10/27/2016 6.8   Final    Physical Examination:  BP 132/74   Pulse 67   Ht '5\' 2"'$  (1.575 m)   Wt 161 lb 12.8 oz (73.4 kg)   SpO2 97%   BMI 29.59 kg/m           ASSESSMENT:  Diabetes type 2, uncontrolled  On insulin And metformin See history of present illness for detailed discussion of current diabetes management, blood sugar patterns and problems identified  She has been irregular with follow-up but her A1c is 6.8 which is relatively better This is despite her blood sugars being variably high especially postprandially She is generally doing well with using the V-go pump Although she is trying to follow instructions for boluses she is still not understanding how to do these  and has various questions as discussed above She is also not able to tolerate more than a small dose of metformin, currently taking regular metformin She has been starting to exercise also only recently  She does need to lose weight  LIPIDS: Needs follow-up  Calcium also needs to be rechecked because of previous episode of high level  PLAN:    Discussed mealtime coverage with the V-go pump She will bolus even with her blood sugars are normal and only skip the bolus in the morning if she is only eating boiled eggs She will need to take 2-4 units bolus for her late night snacks which she appears to be having fairly regularly and causing significant hyperglycemia at times Also she probably should not bolus for postprandial hyperglycemia in case her blood sugars get low especially with using regular insulin She will bolus at least 15-20 minute before planning to eat but may bolus right at snack time More consistent monitoring after lunch  She will try metformin ER instead of regular metformin as directed She will need to change her pump at the same time daily  There are no Patient Instructions on file for this visit.   Counseling time on subjects discussed in assessment and plan sections is over 50% of today's 25 minute visit   Iam Lipson 10/27/2016, 11:28 AM   Note: This office note was prepared with Estate agent. Any transcriptional errors that  result from this process are unintentional.

## 2016-10-31 ENCOUNTER — Telehealth: Payer: Self-pay

## 2016-10-31 NOTE — Telephone Encounter (Signed)
Wants order for NEW Shingles vaccine sent to [facility] as they seem to be only one who has this right now.

## 2016-11-01 ENCOUNTER — Other Ambulatory Visit: Payer: Self-pay | Admitting: Family Medicine

## 2016-11-01 MED ORDER — ZOSTER VAC RECOMB ADJUVANTED 50 MCG/0.5ML IM SUSR: 0.5000 mL | Freq: Once | INTRAMUSCULAR | 1 refills | Status: AC

## 2016-11-01 NOTE — Telephone Encounter (Signed)
Done

## 2016-11-02 ENCOUNTER — Other Ambulatory Visit: Payer: Self-pay

## 2016-11-02 ENCOUNTER — Telehealth: Payer: Self-pay | Admitting: Endocrinology

## 2016-11-02 MED ORDER — METFORMIN HCL ER 500 MG PO TB24: 500.0000 mg | ORAL_TABLET | Freq: Two times a day (BID) | ORAL | 1 refills | Status: DC

## 2016-11-02 NOTE — Telephone Encounter (Signed)
Routing to you °

## 2016-11-02 NOTE — Telephone Encounter (Signed)
Submitted

## 2016-11-02 NOTE — Telephone Encounter (Signed)
Patient called in reference to getting Rx for metFORMIN (GLUCOPHAGE-XR) 500 MG 24 hr tablet sent to  [facility] [place] [place] [phone] (Fax)  Instead of [place]. Patient also stated she would like this to be the 90 day supply. Please call pharmacy and advise.

## 2016-11-10 ENCOUNTER — Ambulatory Visit: Payer: PPO | Attending: Family Medicine

## 2016-11-10 DIAGNOSIS — G4733 Obstructive sleep apnea (adult) (pediatric): Secondary | ICD-10-CM | POA: Diagnosis not present

## 2016-11-20 ENCOUNTER — Ambulatory Visit (INDEPENDENT_AMBULATORY_CARE_PROVIDER_SITE_OTHER): Payer: PPO

## 2016-11-20 VITALS — BP 138/64 | HR 68 | Temp 97.9°F | Resp 16 | Ht 62.0 in | Wt 164.8 lb

## 2016-11-20 DIAGNOSIS — Z0000 Encounter for general adult medical examination without abnormal findings: Secondary | ICD-10-CM

## 2016-11-20 NOTE — Patient Instructions (Addendum)
Melinda Ford , Thank you for taking time to come for your Medicare Wellness Visit. I appreciate your ongoing commitment to your health goals. Please review the following plan we discussed and let me know if I can assist you in the future.   Screening recommendations/referrals: Colonoscopy: completed, please bring copy of results Mammogram: completed 05/01/2016 Bone Density: up to date, please bring copy of results Recommended yearly ophthalmology/optometry visit for glaucoma screening and checkup Recommended yearly dental visit for hygiene and checkup  Vaccinations: Influenza vaccine: due 12/2016 Pneumococcal vaccine: up to date, please bring immunization records Tdap vaccine: due, check with your insurance company for coverage Shingles vaccine: up to date  Advanced directives: Please bring a copy of your health care power of attorney and living will to the office at your convenience.  Conditions/risks identified: recommend drinking at least 5-6 glasses of water a day   Next appointment: Follow up in one year for your annual wellness exam.    Preventive Care 65 Years and Older, Female Preventive care refers to lifestyle choices and visits with your health care provider that can promote health and wellness. What does preventive care include?  A yearly physical exam. This is also called an annual well check.  Dental exams once or twice a year.  Routine eye exams. Ask your health care provider how often you should have your eyes checked.  Personal lifestyle choices, including:  Daily care of your teeth and gums.  Regular physical activity.  Eating a healthy diet.  Avoiding tobacco and drug use.  Limiting alcohol use.  Practicing safe sex.  Taking low-dose aspirin every day.  Taking vitamin and mineral supplements as recommended by your health care provider. What happens during an annual well check? The services and screenings done by your health care provider during your  annual well check will depend on your age, overall health, lifestyle risk factors, and family history of disease. Counseling  Your health care provider may ask you questions about your:  Alcohol use.  Tobacco use.  Drug use.  Emotional well-being.  Home and relationship well-being.  Sexual activity.  Eating habits.  History of falls.  Memory and ability to understand (cognition).  Work and work Statistician.  Reproductive health. Screening  You may have the following tests or measurements:  Height, weight, and BMI.  Blood pressure.  Lipid and cholesterol levels. These may be checked every 5 years, or more frequently if you are over 61 years old.  Skin check.  Lung cancer screening. You may have this screening every year starting at age 43 if you have a 30-pack-year history of smoking and currently smoke or have quit within the past 15 years.  Fecal occult blood test (FOBT) of the stool. You may have this test every year starting at age 78.  Flexible sigmoidoscopy or colonoscopy. You may have a sigmoidoscopy every 5 years or a colonoscopy every 10 years starting at age 40.  Hepatitis C blood test.  Hepatitis B blood test.  Sexually transmitted disease (STD) testing.  Diabetes screening. This is done by checking your blood sugar (glucose) after you have not eaten for a while (fasting). You may have this done every 1-3 years.  Bone density scan. This is done to screen for osteoporosis. You may have this done starting at age 25.  Mammogram. This may be done every 1-2 years. Talk to your health care provider about how often you should have regular mammograms. Talk with your health care provider about your test  results, treatment options, and if necessary, the need for more tests. Vaccines  Your health care provider may recommend certain vaccines, such as:  Influenza vaccine. This is recommended every year.  Tetanus, diphtheria, and acellular pertussis (Tdap, Td)  vaccine. You may need a Td booster every 10 years.  Zoster vaccine. You may need this after age 23.  Pneumococcal 13-valent conjugate (PCV13) vaccine. One dose is recommended after age 39.  Pneumococcal polysaccharide (PPSV23) vaccine. One dose is recommended after age 64. Talk to your health care provider about which screenings and vaccines you need and how often you need them. This information is not intended to replace advice given to you by your health care provider. Make sure you discuss any questions you have with your health care provider. Document Released: 04/16/2015 Document Revised: 12/08/2015 Document Reviewed: 01/19/2015 Elsevier Interactive Patient Education  2017 [place] Prevention in the Home Falls can cause injuries. They can happen to people of all ages. There are many things you can do to make your home safe and to help prevent falls. What can I do on the outside of my home?  Regularly fix the edges of walkways and driveways and fix any cracks.  Remove anything that might make you trip as you walk through a door, such as a raised step or threshold.  Trim any bushes or trees on the path to your home.  Use bright outdoor lighting.  Clear any walking paths of anything that might make someone trip, such as rocks or tools.  Regularly check to see if handrails are loose or broken. Make sure that both sides of any steps have handrails.  Any raised decks and porches should have guardrails on the edges.  Have any leaves, snow, or ice cleared regularly.  Use sand or salt on walking paths during winter.  Clean up any spills in your garage right away. This includes oil or grease spills. What can I do in the bathroom?  Use night lights.  Install grab bars by the toilet and in the tub and shower. Do not use towel bars as grab bars.  Use non-skid mats or decals in the tub or shower.  If you need to sit down in the shower, use a plastic, non-slip  stool.  Keep the floor dry. Clean up any water that spills on the floor as soon as it happens.  Remove soap buildup in the tub or shower regularly.  Attach bath mats securely with double-sided non-slip rug tape.  Do not have throw rugs and other things on the floor that can make you trip. What can I do in the bedroom?  Use night lights.  Make sure that you have a light by your bed that is easy to reach.  Do not use any sheets or blankets that are too big for your bed. They should not hang down onto the floor.  Have a firm chair that has side arms. You can use this for support while you get dressed.  Do not have throw rugs and other things on the floor that can make you trip. What can I do in the kitchen?  Clean up any spills right away.  Avoid walking on wet floors.  Keep items that you use a lot in easy-to-reach places.  If you need to reach something above you, use a strong step stool that has a grab bar.  Keep electrical cords out of the way.  Do not use floor polish or wax that makes  floors slippery. If you must use wax, use non-skid floor wax.  Do not have throw rugs and other things on the floor that can make you trip. What can I do with my stairs?  Do not leave any items on the stairs.  Make sure that there are handrails on both sides of the stairs and use them. Fix handrails that are broken or loose. Make sure that handrails are as long as the stairways.  Check any carpeting to make sure that it is firmly attached to the stairs. Fix any carpet that is loose or worn.  Avoid having throw rugs at the top or bottom of the stairs. If you do have throw rugs, attach them to the floor with carpet tape.  Make sure that you have a light switch at the top of the stairs and the bottom of the stairs. If you do not have them, ask someone to add them for you. What else can I do to help prevent falls?  Wear shoes that:  Do not have high heels.  Have rubber bottoms.  Are  comfortable and fit you well.  Are closed at the toe. Do not wear sandals.  If you use a stepladder:  Make sure that it is fully opened. Do not climb a closed stepladder.  Make sure that both sides of the stepladder are locked into place.  Ask someone to hold it for you, if possible.  Clearly mark and make sure that you can see:  Any grab bars or handrails.  First and last steps.  Where the edge of each step is.  Use tools that help you move around (mobility aids) if they are needed. These include:  Canes.  Walkers.  Scooters.  Crutches.  Turn on the lights when you go into a dark area. Replace any light bulbs as soon as they burn out.  Set up your furniture so you have a clear path. Avoid moving your furniture around.  If any of your floors are uneven, fix them.  If there are any pets around you, be aware of where they are.  Review your medicines with your doctor. Some medicines can make you feel dizzy. This can increase your chance of falling. Ask your doctor what other things that you can do to help prevent falls. This information is not intended to replace advice given to you by your health care provider. Make sure you discuss any questions you have with your health care provider. Document Released: 01/14/2009 Document Revised: 08/26/2015 Document Reviewed: 04/24/2014 Elsevier Interactive Patient Education  2017 [employer].

## 2016-11-20 NOTE — Progress Notes (Signed)
Subjective:   Melinda Ford is a 71 y.o. female who presents for Medicare Annual (Subsequent) preventive examination.  Review of Systems:  Cardiac Risk Factors include: advanced age (>69mn, >>55women);hypertension;diabetes mellitus;obesity (BMI >30kg/m2)     Objective:     Vitals: BP 138/64 (BP Location: Left Arm, Patient Position: Sitting)   Pulse 68   Temp 97.9 F (36.6 C)   Resp 16   Ht _0  (1.575 m)   Wt 164 lb 12.8 oz (74.8 kg)   BMI 30.14 kg/m   Body mass index is 30.14 kg/m.   Tobacco History  Smoking Status  . Never Smoker  Smokeless Tobacco  . Never Used     Counseling given: Not Answered   Past Medical History:  Diagnosis Date  . Allergy   . Arthritis   . Cataract   . [place]   . Hypertension   . OSA (obstructive sleep apnea) 08/23/2016  . Osteoporosis    Past Surgical History:  Procedure Laterality Date  . ABDOMINAL HYSTERECTOMY     Family History  Problem Relation Age of Onset  . Breast cancer Other 40  . Alcohol abuse Father   . Mental illness Father   . Stroke Father   . Diabetes Sister   . Asthma Brother   . Diabetes Brother   . Diabetes Maternal Aunt   . Heart disease Maternal Aunt   . Diabetes Maternal Uncle   . Early death Maternal Uncle   . Breast cancer Cousin 510      mat cousin   History  Sexual Activity  . Sexual activity: No    Outpatient Encounter Prescriptions as of 11/20/2016  Medication Sig  . acetaminophen (TYLENOL) 500 MG tablet Take 1,000 mg by mouth 2 (two) times daily.  .Marland KitchenamLODipine (NORVASC) 10 MG tablet amlodipine 10 mg tablet  . Blood Glucose Monitoring Suppl (ONETOUCH VERIO) w/Device KIT 1 each by Does not apply route 4 (four) times daily.  . Cholecalciferol (VITAMIN D) 2000 UNITS CAPS Take 1 capsule (2,000 Units total) by mouth daily.  .Marland KitchenGINKGO BILOBA COMPLEX PO Take by mouth.  .Marland Kitchenglucose blood (ONETOUCH VERIO) test strip Use to test blood sugar 4 times daily  .  insulin aspart (NOVOLOG) 100 UNIT/ML injection Novolog U-100 Insulin aspart 100 unit/mL subcutaneous solution  . Insulin Disposable Pump (V-GO 20) KIT USE ONE PER DAY  . Insulin Syringe-Needle U-100 (INSULIN SYRINGE .3CC/31GX5/16") 31G X 5/16" 0.3 ML MISC Inject 1 Units as directed 2 (two) times daily.  .Marland Kitchenketoconazole (NIZORAL) 2 % cream Apply 1 application topically daily.  .Marland Kitchenloratadine (CLARITIN) 10 MG tablet Take 1 tablet (10 mg total) by mouth daily as needed for allergies.  .Marland Kitchenlosartan (COZAAR) 100 MG tablet Take 1 tablet (100 mg total) by mouth daily.  . meloxicam (MOBIC) 15 MG tablet Take 1 tablet (15 mg total) by mouth daily as needed for pain.  . metFORMIN (GLUCOPHAGE-XR) 500 MG 24 hr tablet Take 1 tablet (500 mg total) by mouth 2 (two) times daily with a meal.  . ReliOn Ultra Thin Lancets MISC Inject 1 Units as directed 3 (three) times daily as needed.  . gabapentin (NEURONTIN) 100 MG capsule Take 1 capsule (100 mg total) by mouth at bedtime. (Patient not taking: Reported on 11/20/2016)   No facility-administered encounter medications on file as of 11/20/2016.     Activities of Daily Living In your present state of health, do you have any difficulty  performing the following activities: 11/20/2016  Hearing? N  Vision? Y  Difficulty concentrating or making decisions? Y  Walking or climbing stairs? Y  Comment d/t leg pain   Dressing or bathing? N  Doing errands, shopping? N  Preparing Food and eating ? N  Using the Toilet? N  In the past six months, have you accidently leaked urine? N  Do you have problems with loss of bowel control? N  Managing your Medications? N  Managing your Finances? N  Housekeeping or managing your Housekeeping? N  Some recent data might be hidden    Patient Care Team: Adline Potter, MD as PCP - General (Family Medicine) Elayne Snare, MD as Consulting Physician (Endocrinology)    Assessment:     Exercise Activities and Dietary  recommendations Current Exercise Habits: Structured exercise class, Time (Minutes): 60, Frequency (Times/Week): 2, Weekly Exercise (Minutes/Week): 120, Intensity: Mild, Exercise limited by: None identified  Goals    None     Fall Risk Fall Risk  11/20/2016 02/04/2016 10/01/2015 05/13/2015 09/16/2014  Falls in the past year? No No No Yes Yes  Number falls in past yr: - - - 1 2 or more  Injury with Fall? - - - - No   Depression Screen PHQ 2/9 Scores 11/20/2016 02/04/2016 10/01/2015 05/13/2015  PHQ - 2 Score 0 1 0 0     Cognitive Function MMSE - Mini Mental State Exam 07/26/2015  Orientation to time 5  Orientation to Place 4  Registration 3  Attention/ Calculation 5  Recall 3  Language- name 2 objects 2  Language- repeat 1  Language- follow 3 step command 3  Language- read & follow direction 1  Write a sentence 1  Copy design 1  Total score 29     6CIT Screen 11/20/2016  What Year? 0 points  What month? 0 points  What time? 0 points  Count back from 20 0 points  Months in reverse 0 points  Repeat phrase 0 points  Total Score 0    Immunization History  Administered Date(s) Administered  . Influenza, High Dose Seasonal PF 01/25/2016  . Influenza,inj,Quad PF,36+ Mos 01/15/2015  . Zoster 10/25/2010   Screening Tests Health Maintenance  Topic Date Due  . INFLUENZA VACCINE  11/01/2016  . Hepatitis C Screening  03/03/2017 (Originally 1945/08/13)  . TETANUS/TDAP  12/02/2017 (Originally 09/17/1964)  . FOOT EXAM  12/20/2016  . OPHTHALMOLOGY EXAM  01/27/2017  . HEMOGLOBIN A1C  04/29/2017  . MAMMOGRAM  05/01/2018  . COLONOSCOPY  11/02/2023  . DEXA SCAN  Completed  . PNA vac Low Risk Adult  Completed      Plan:     I have personally reviewed and addressed the Medicare Annual Wellness questionnaire and have noted the following in the patient's chart:  A. Medical and social history B. Use of alcohol, tobacco or illicit drugs  C. Current medications and supplements D. Functional  ability and status E.  Nutritional status F.  Physical activity G. Advance directives H. List of other physicians I.  Hospitalizations, surgeries, and ER visits in previous 12 months J.  [place] such as hearing and vision if needed, cognitive and depression L. Referrals and appointments   In addition, I have reviewed and discussed with patient certain preventive protocols, quality metrics, and best practice recommendations. A written personalized care plan for preventive services as well as general preventive health recommendations were provided to patient.   Signed,  Tyler Aas, LPN Nurse Health Advisor  MD Recommendations:

## 2016-12-21 DIAGNOSIS — H6121 Impacted cerumen, right ear: Secondary | ICD-10-CM | POA: Diagnosis not present

## 2016-12-21 DIAGNOSIS — H902 Conductive hearing loss, unspecified: Secondary | ICD-10-CM | POA: Diagnosis not present

## 2016-12-26 DIAGNOSIS — G4733 Obstructive sleep apnea (adult) (pediatric): Secondary | ICD-10-CM | POA: Diagnosis not present

## 2017-01-01 NOTE — Telephone Encounter (Signed)
error 

## 2017-01-08 ENCOUNTER — Other Ambulatory Visit: Payer: Self-pay | Admitting: Family Medicine

## 2017-01-09 ENCOUNTER — Other Ambulatory Visit: Payer: Self-pay

## 2017-01-17 DIAGNOSIS — M79675 Pain in left toe(s): Secondary | ICD-10-CM | POA: Diagnosis not present

## 2017-01-17 DIAGNOSIS — M79674 Pain in right toe(s): Secondary | ICD-10-CM | POA: Diagnosis not present

## 2017-01-17 DIAGNOSIS — B351 Tinea unguium: Secondary | ICD-10-CM | POA: Diagnosis not present

## 2017-01-22 ENCOUNTER — Encounter: Payer: Self-pay | Admitting: Family Medicine

## 2017-01-22 ENCOUNTER — Ambulatory Visit (INDEPENDENT_AMBULATORY_CARE_PROVIDER_SITE_OTHER): Payer: PPO | Admitting: Family Medicine

## 2017-01-22 VITALS — BP 138/78 | HR 77 | Resp 16 | Ht 62.0 in | Wt 162.2 lb

## 2017-01-22 DIAGNOSIS — I10 Essential (primary) hypertension: Secondary | ICD-10-CM

## 2017-01-22 DIAGNOSIS — Z23 Encounter for immunization: Secondary | ICD-10-CM

## 2017-01-22 DIAGNOSIS — E559 Vitamin D deficiency, unspecified: Secondary | ICD-10-CM | POA: Diagnosis not present

## 2017-01-22 DIAGNOSIS — E1140 Type 2 diabetes mellitus with diabetic neuropathy, unspecified: Secondary | ICD-10-CM

## 2017-01-22 DIAGNOSIS — G4733 Obstructive sleep apnea (adult) (pediatric): Secondary | ICD-10-CM

## 2017-01-22 DIAGNOSIS — L230 Allergic contact dermatitis due to metals: Secondary | ICD-10-CM

## 2017-01-22 MED ORDER — ZOSTER VAC RECOMB ADJUVANTED 50 MCG/0.5ML IM SUSR: 0.5000 mL | Freq: Once | INTRAMUSCULAR | 1 refills | Status: AC

## 2017-01-22 NOTE — Progress Notes (Signed)
Date:  01/22/2017   Name:  Melinda Ford   DOB:  1945-11-22   MRN:  [id number]  PCP:  Adline Potter, MD    Chief Complaint: Rash (ears and stomach itching and small rash and dry spots on ear lobes. Wants Shingles Rx for vaccine )   History of Present Illness: This is a 71 y.o. female seen for 4 month f/u. C/o rash on earlobes and occ on abdomen. Keto cream helped with chest and neck rash last visit. T2DM well controlled, last a1c 6.8% in July, sees endo later this week. Has new CPAP machine but still uncomfortable at night. DDD still bothers but tolerable on Tylenol daily and Mobic prn, ortho following. Gets "queasy" at times, thinks amoxicllin helps. Requests zoster and flu imms, tetanus status unknown.  Review of Systems:  Review of Systems  Constitutional: Negative for chills and fatigue.  Respiratory: Negative for cough and shortness of breath.   Cardiovascular: Negative for chest pain and leg swelling.  Genitourinary: Negative for difficulty urinating.  Neurological: Negative for syncope and light-headedness.    Patient Active Problem List   Diagnosis Date Noted  . Primary localized osteoarthrosis of ankle and foot 09/20/2016  . Tinea versicolor 09/20/2016  . OSA (obstructive sleep apnea) 08/23/2016  . Allergic rhinitis 07/13/2016  . Radiculopathy of lumbosacral region 06/22/2016  . Right sided sciatica 03/09/2016  . Lumbar spondylosis 12/02/2015  . Overweight (BMI 25.0-29.9) 10/01/2015  . Vitamin D deficiency 09/18/2014  . Onychomycosis 09/16/2014  . Degenerative disc disease, lumbar 09/16/2014  . Benign essential HTN 10/10/2010  . Osteoporosis 10/10/2010  . Type 2 diabetes mellitus, uncontrolled, with neuropathy ([place]) 10/10/2010    Prior to Admission medications   Medication Sig Start Date End Date Taking? Authorizing Provider  acetaminophen (TYLENOL) 500 MG tablet Take 1,000 mg by mouth 2 (two) times daily.   Yes [provider]  amLODipine (NORVASC) 10 MG  tablet amlodipine 10 mg tablet   Yes [provider]  Blood Glucose Monitoring Suppl (ONETOUCH VERIO) w/Device KIT 1 each by Does not apply route 4 (four) times daily. 01/18/16  Yes Elayne Snare, MD  Cholecalciferol (VITAMIN D) 2000 UNITS CAPS Take 1 capsule (2,000 Units total) by mouth daily. 09/18/14  Yes Betta Balla, Gwyndolyn Saxon, MD  gabapentin (NEURONTIN) 100 MG capsule Take 1 capsule (100 mg total) by mouth at bedtime. 05/30/16  Yes Taylin Leder, Gwyndolyn Saxon, MD  Evelina Bucy BILOBA COMPLEX PO Take by mouth.   Yes [provider]  glucose blood (ONETOUCH VERIO) test strip Use to test blood sugar 4 times daily 01/18/16  Yes Elayne Snare, MD  insulin aspart (NOVOLOG) 100 UNIT/ML injection Novolog U-100 Insulin aspart 100 unit/mL subcutaneous solution   Yes [provider]  Insulin Disposable Pump (V-GO 20) KIT USE ONE PER DAY 10/27/16  Yes Elayne Snare, MD  Insulin Syringe-Needle U-100 (INSULIN SYRINGE .3CC/31GX5/16") 31G X 5/16" 0.3 ML MISC Inject 1 Units as directed 2 (two) times daily. 01/27/15  Yes Eliasar Hlavaty, Gwyndolyn Saxon, MD  ketoconazole (NIZORAL) 2 % cream Apply 1 application topically daily. 09/20/16  Yes Bosco Paparella, Gwyndolyn Saxon, MD  loratadine (CLARITIN) 10 MG tablet Take 1 tablet (10 mg total) by mouth daily as needed for allergies. 07/13/16  Yes Zyquan Crotty, Gwyndolyn Saxon, MD  losartan (COZAAR) 100 MG tablet Take 1 tablet (100 mg total) by mouth daily. 05/30/16 05/31/17 Yes Jac Romulus, Gwyndolyn Saxon, MD  meloxicam (MOBIC) 15 MG tablet TAKE 1 TABLET BY MOUTH ONCE DAILY AS NEEDED FOR PAIN 01/08/17  Yes Sriya Kroeze, Gwyndolyn Saxon, MD  metFORMIN [facility])  500 MG 24 hr tablet Take 1 tablet (500 mg total) by mouth 2 (two) times daily with a meal. 11/02/16  Yes Elayne Snare, MD  ReliOn Ultra Thin Lancets MISC Inject 1 Units as directed 3 (three) times daily as needed. 05/06/14  Yes [provider]  Zoster Vaccine Adjuvanted [facility]) injection Inject 0.5 mLs into the muscle once. 01/22/17 01/22/17  Adline Potter, MD    No Known  Allergies  Past Surgical History:  Procedure Laterality Date  . ABDOMINAL HYSTERECTOMY      Social History  Substance Use Topics  . Smoking status: Never Smoker  . Smokeless tobacco: Never Used  . Alcohol use No    Family History  Problem Relation Age of Onset  . Breast cancer Other 40  . Alcohol abuse Father   . Mental illness Father   . Stroke Father   . Diabetes Sister   . Asthma Brother   . Diabetes Brother   . Diabetes Maternal Aunt   . Heart disease Maternal Aunt   . Diabetes Maternal Uncle   . Early death Maternal Uncle   . Breast cancer Cousin 80       mat cousin    Medication list has been reviewed and updated.  Physical Examination: BP 138/78   Pulse 77   Resp 16   Ht _0  (1.575 m)   Wt 162 lb 3.2 oz (73.6 kg)   SpO2 96%   BMI 29.67 kg/m   Physical Exam  Constitutional: She appears well-developed and well-nourished.  Cardiovascular: Normal rate, regular rhythm and normal heart sounds.   Pulmonary/Chest: Effort normal and breath sounds normal.  Musculoskeletal: She exhibits no edema.  Neurological: She is alert.  Skin: Skin is warm and dry.  Eczematous rash at earring insertions  Psychiatric: She has a normal mood and affect. Her behavior is normal.  Nursing note and vitals reviewed.   Assessment and Plan:  1. Type 2 diabetes, controlled, with neuropathy ([place]) Well controlled, endo following  2. Allergic contact dermatitis due to metals Avoid earring use, ketoconazole cream prn  3. Benign essential HTN Well controlled on current regimen  4. OSA (obstructive sleep apnea) Encouraged trying different CPAP masks until comfortable  5. Vitamin D deficiency Marginal control on supplement, endo following  6. Need for influenza vaccination - Flu vaccine HIGH DOSE PF (Fluzone High dose)  7. Need for diphtheria-tetanus-pertussis (Tdap) vaccine - Tdap vaccine greater than or equal to 7yo IM  8. Need for zoster vaccination - Zoster Vaccine  Adjuvanted [facility]) injection; Inject 0.5 mLs into the muscle once.  Dispense: 0.5 mL; Refill: 1  Return in about 6 months (around 07/23/2017).  [name]. [facility]  01/22/2017

## 2017-01-25 DIAGNOSIS — G4733 Obstructive sleep apnea (adult) (pediatric): Secondary | ICD-10-CM | POA: Diagnosis not present

## 2017-01-26 ENCOUNTER — Ambulatory Visit: Payer: PPO | Admitting: Endocrinology

## 2017-02-05 ENCOUNTER — Telehealth: Payer: Self-pay | Admitting: Endocrinology

## 2017-02-05 NOTE — Telephone Encounter (Signed)
Please advise 

## 2017-02-05 NOTE — Telephone Encounter (Signed)
Called patient and let her know that we can do her labs when she comes to her appointment on Wednesday.

## 2017-02-05 NOTE — Telephone Encounter (Signed)
She can have same day labs since she comes from out of town

## 2017-02-05 NOTE — Telephone Encounter (Signed)
Patient want to know if she have to come in for labs before her appt, pleas call and let her know

## 2017-02-05 NOTE — Telephone Encounter (Signed)
Patient has called three times to day to figure whether or not dr Dwyane Dee want her to get her lab drawn and to put order in the system. She ask you to call her before you you leave.

## 2017-02-07 ENCOUNTER — Encounter: Payer: Self-pay | Admitting: Endocrinology

## 2017-02-07 ENCOUNTER — Ambulatory Visit: Payer: PPO | Admitting: Endocrinology

## 2017-02-07 VITALS — BP 116/76 | HR 66 | Ht 62.0 in | Wt 162.0 lb

## 2017-02-07 DIAGNOSIS — E1165 Type 2 diabetes mellitus with hyperglycemia: Secondary | ICD-10-CM

## 2017-02-07 DIAGNOSIS — Z794 Long term (current) use of insulin: Secondary | ICD-10-CM

## 2017-02-07 LAB — POCT GLYCOSYLATED HEMOGLOBIN (HGB A1C): Hemoglobin A1C: 6.7

## 2017-02-07 NOTE — Progress Notes (Signed)
Patient ID: Melinda Ford, female   DOB: 15-Dec-1945, 71 y.o.   MRN: [id number]            Reason for Appointment: Follow-up for Type 2 Diabetes   History of Present Illness:          Date of diagnosis of type 2 diabetes mellitus: ?  2004        Background history:   She previously had been on metformin and also Avandia initially Detailed records of her care are not available prior to 2013 or so She was tried on Januvia in 2016 but this was not effective and this was stopped in 5/70 She was started on insulin in 2/16 with small doses of Lantus insulin Her A1c has been consistently over 8% since about 2013  Recent history:   INSULIN regimen is:   V-go 20 unit pump since 12/23/15, boluse 8--9-2 clicks with meals     Non-insulin hypoglycemic drugs the patient is taking are: metformin ER 500 mg bid   Her A1c is now stable at 6.7, was 6.8 previously and has been as high as 9.3  Current management, blood sugar patterns and problems identified:  She is  taking the Regular Insulin from [facility] for her pump  He has variable fasting blood sugars although not taking them consistently  HIGHEST blood sugars are at bedtime sometimes during the night when she is taking them.  Average blood sugar overnight is over 200  She may then take extra insulin during the night when the blood sugar has gone up a bolus  Again she is not consistent with her diet in the evenings and may be getting more snacks late at night or not always covering her evening meals adequately; may also have some high-fat meals in the evening  Blood sugars before lunch and supper are generally fairly good  Her main concern today is weight gain although recently this has leveled off.  Her weight did go up in the first part of the year  She is however doing any specific exercise aerobically only twice a week or so  She does take her metformin twice a day, no side effects with this  Also again she may not bolus consistently  30 minutes before eating her meals especially at suppertime        Side effects from medications have been: nausea and diarrhea from 1000 MG metformin  Compliance with the medical regimen: fair Hypoglycemia:  level  Glucose monitoring:  done 3 times a day         Glucometer: One Touch Verio         Blood Glucose readings by download for the last 2 weeks:  Mean values apply above for all meters except median for One Touch  PRE-MEAL Fasting Lunch Dinner Bedtime Overall  Glucose range:  79-254    60-1 77  1 17-288    Mean/median: 111  108   206  129+/-65      Self-care:   Meal times are:  Breakfast is at 9-10 AM, dinner 7-8  Typical meal intake: Breakfast is toast, oatmeal, sometimes bacon, milk   Lunch: Sometimes hot dogs, fish sandwich Evening meal is  chicken, steak, pork chop, corn, greens.           Dietician visit, most recent: 2013 CDE visit: 12/2015               Exercise: Gym 2/7  Weight history:  Wt Readings from Last 3 Encounters:  02/07/17 162 lb (73.5 kg)  01/22/17 162 lb 3.2 oz (73.6 kg)  11/20/16 164 lb 12.8 oz (74.8 kg)    Glycemic control:   Lab Results  Component Value Date   HGBA1C 6.7 02/07/2017   HGBA1C 6.8 10/27/2016   HGBA1C 7.0 (H) 05/18/2016   Lab Results  Component Value Date   MICROALBUR 0.9 10/27/2016   LDLCALC 100 (H) 03/03/2016   CREATININE 0.69 10/27/2016   Lab Results  Component Value Date   MICRALBCREAT 0.8 10/27/2016    Other active problems: See review of systems   Allergies as of 02/07/2017   No Known Allergies     Medication List        Accurate as of 02/07/17 11:59 PM. Always use your most recent med list.          acetaminophen 500 MG tablet Commonly known as:  TYLENOL Take 1,000 mg by mouth 2 (two) times daily.   amLODipine 10 MG tablet Commonly known as:  NORVASC amlodipine 10 mg tablet   gabapentin 100 MG capsule Commonly known as:  NEURONTIN Take 1 capsule (100 mg total) by mouth at bedtime.     GINKGO BILOBA COMPLEX PO Take by mouth.   glucose blood test strip Commonly known as:  ONETOUCH VERIO Use to test blood sugar 4 times daily   INSULIN SYRINGE .3CC/31GX5/16" 31G X 5/16" 0.3 ML Misc Inject 1 Units as directed 2 (two) times daily.   ketoconazole 2 % cream Commonly known as:  NIZORAL Apply 1 application topically daily.   loratadine 10 MG tablet Commonly known as:  CLARITIN Take 1 tablet (10 mg total) by mouth daily as needed for allergies.   losartan 100 MG tablet Commonly known as:  COZAAR Take 1 tablet (100 mg total) by mouth daily.   meloxicam 15 MG tablet Commonly known as:  MOBIC TAKE 1 TABLET BY MOUTH ONCE DAILY AS NEEDED FOR PAIN   metFORMIN 500 MG 24 hr tablet Commonly known as:  GLUCOPHAGE-XR Take 1 tablet (500 mg total) by mouth 2 (two) times daily with a meal.   NOVOLOG 100 UNIT/ML injection Generic drug:  insulin aspart Novolog U-100 Insulin aspart 100 unit/mL subcutaneous solution   ONETOUCH VERIO w/Device Kit 1 each by Does not apply route 4 (four) times daily.   ReliOn Ultra Thin Lancets Misc Inject 1 Units as directed 3 (three) times daily as needed.   V-GO 20 Kit USE ONE PER DAY   Vitamin D 2000 units Caps Take 1 capsule (2,000 Units total) by mouth daily.       Allergies: No Known Allergies  Past Medical History:  Diagnosis Date  . Allergy   . Arthritis   . Cataract   . Diabetes mellitus without complication ([place])   . Hypertension   . OSA (obstructive sleep apnea) 08/23/2016  . Osteoporosis     Past Surgical History:  Procedure Laterality Date  . ABDOMINAL HYSTERECTOMY      Family History  Problem Relation Age of Onset  . Breast cancer Other 40  . Alcohol abuse Father   . Mental illness Father   . Stroke Father   . Diabetes Sister   . Asthma Brother   . Diabetes Brother   . Diabetes Maternal Aunt   . Heart disease Maternal Aunt   . Diabetes Maternal Uncle   . Early death Maternal Uncle   . Breast cancer  Cousin 67       mat cousin    Social History:  reports that  has never smoked. she has never used smokeless tobacco. She reports that she does not drink alcohol or use drugs.   Review of Systems  Lipid history: She is not on statin treatment, LDL borderline Followed by PCP Needs follow-up levels  Lab Results  Component Value Date   CHOL 217 (H) 03/03/2016   HDL 104.40 03/03/2016   LDLCALC 100 (H) 03/03/2016   TRIG 60.0 03/03/2016   CHOLHDL 2 03/03/2016           Hypertension: on treatment for 15 yrs, on amlodipine and losartan, Blood pressure controlled Also followed by PCP  BP Readings from Last 3 Encounters:  02/07/17 116/76  01/22/17 138/78  11/20/16 138/64     Most recent foot exam: 9/17  She did have a high calcium occasionally  Lab Results  Component Value Date   CALCIUM 10.3 10/27/2016     LABS:  Office Visit on 02/07/2017  Component Date Value Ref Range Status  . Hemoglobin A1C 02/07/2017 6.7   Final    Physical Examination:  BP 116/76   Pulse 66   Ht 5' 2" (1.575 m)   Wt 162 lb (73.5 kg)   SpO2 97%   BMI 29.63 kg/m           ASSESSMENT:  Diabetes type 2, uncontrolled  On insulin And metformin See history of present illness for detailed discussion of current diabetes management, blood sugar patterns and problems identified A1c is reasonably good at 6.7 Most of her difficulty with control is related to postprandial hyperglycemia in the evenings after supper or overnight based on her compliance with diet and bolusing and meals   She is concerned about her tendency to weight gain and is asking about alternatives to insulin and discussed that she is insulin deficient and is not getting excessive amount of insulin with average blood sugar at home about 150 Has only one low blood sugar at suppertime Also may have some tendency to low normal sugars with seeing regular insulin instead of the faster analog insulin She has done only some  exercise  LIPIDS: Needs follow-up   PLAN:   Although she is a good candidate for GLP-1 drugs she is concerned about the cost and will not take it She does need to see the dietitian for better meal planning Discussed need to keep consistent diet with reduced snacks, high carbohydrate and high fat foods and late night eating If she does have a snack she needs to bolus at least 2 units She may have less hyperglycemia late at night if she bolus is consistently for evening meal and also checks blood sugars 2 hours later  Discussed mealtime coverage with the V-go pump Continue metformin 1000 mg a day  More consistent walking for exercise  She will need to change her pump at the same time daily  Patient Instructions  Must click before each meal  Take 1 click for snack  More sugars 2 hrs after dinner  Walk daily    Counseling time on subjects discussed in assessment and plan sections is over 50% of today's 25 minute visit   Marijo Quizon 02/08/2017, 7:57 AM   Note: This office note was prepared with Dragon voice recognition system technology. Any transcriptional errors that result from this process are unintentional.

## 2017-02-07 NOTE — Patient Instructions (Signed)
Must click before each meal  Take 1 click for snack  More sugars 2 hrs after dinner  Walk daily

## 2017-02-08 ENCOUNTER — Other Ambulatory Visit: Payer: Self-pay | Admitting: Endocrinology

## 2017-02-20 ENCOUNTER — Encounter: Payer: Self-pay | Admitting: Family Medicine

## 2017-02-20 ENCOUNTER — Ambulatory Visit (INDEPENDENT_AMBULATORY_CARE_PROVIDER_SITE_OTHER): Payer: PPO | Admitting: Family Medicine

## 2017-02-20 VITALS — BP 138/80 | HR 58 | Temp 98.3°F | Resp 16 | Ht 62.0 in | Wt 161.8 lb

## 2017-02-20 DIAGNOSIS — E1140 Type 2 diabetes mellitus with diabetic neuropathy, unspecified: Secondary | ICD-10-CM | POA: Diagnosis not present

## 2017-02-20 DIAGNOSIS — E559 Vitamin D deficiency, unspecified: Secondary | ICD-10-CM

## 2017-02-20 DIAGNOSIS — G4733 Obstructive sleep apnea (adult) (pediatric): Secondary | ICD-10-CM

## 2017-02-20 DIAGNOSIS — J069 Acute upper respiratory infection, unspecified: Secondary | ICD-10-CM | POA: Diagnosis not present

## 2017-02-20 DIAGNOSIS — I10 Essential (primary) hypertension: Secondary | ICD-10-CM

## 2017-02-20 MED ORDER — METFORMIN HCL ER 500 MG PO TB24: 500.0000 mg | ORAL_TABLET | Freq: Two times a day (BID) | ORAL | 3 refills | Status: DC

## 2017-02-20 NOTE — Progress Notes (Signed)
Date:  02/20/2017   Name:  Melinda Ford   DOB:  1946-02-01   MRN:  [id number]  PCP:  Adline Potter, MD    Chief Complaint: Sleep Apnea (CPAP Review per insurance...Marland KitchenMarland KitchenPlease add to Ov note: Patient using CPAP machine every night for 4 hours. Says no snoring and sleeping well after getting used to this for the first few weeks it was hard. Husband sees a difference. ); Diabetes (Needs Metformin Refill); and Sinus Problem (Sneezing and eyes watering-Had fever early in week took aspirin and it went away. )   History of Present Illness:  This is a 71 y.o. female seen for one month f/u. Needs documentation CPAP helping, using 4-6 hrs/night, sleeping better, husband says no longer snoring. DM well controlled, followed by endo. C/o 2d hx rhinorrhea, ST, sinus congestion, LG fever initially.  Review of Systems:  Review of Systems  Constitutional: Negative for chills and fever.  Respiratory: Negative for cough and shortness of breath.   Cardiovascular: Negative for chest pain and leg swelling.  Endocrine: Negative for polydipsia and polyuria.  Genitourinary: Negative for difficulty urinating.  Neurological: Negative for syncope and light-headedness.    Patient Active Problem List   Diagnosis Date Noted  . Primary localized osteoarthrosis of ankle and foot 09/20/2016  . Tinea versicolor 09/20/2016  . OSA (obstructive sleep apnea) 08/23/2016  . Allergic rhinitis 07/13/2016  . Radiculopathy of lumbosacral region 06/22/2016  . Right sided sciatica 03/09/2016  . Lumbar spondylosis 12/02/2015  . Overweight (BMI 25.0-29.9) 10/01/2015  . Vitamin D deficiency 09/18/2014  . Onychomycosis 09/16/2014  . Degenerative disc disease, lumbar 09/16/2014  . Benign essential HTN 10/10/2010  . Osteoporosis 10/10/2010  . Type 2 diabetes, controlled, with neuropathy ([place]) 10/10/2010    Prior to Admission medications   Medication Sig Start Date End Date Taking? Authorizing Provider  acetaminophen  (TYLENOL) 500 MG tablet Take 1,000 mg by mouth 2 (two) times daily.   Yes [provider]  amLODipine (NORVASC) 10 MG tablet amlodipine 10 mg tablet   Yes [provider]  Blood Glucose Monitoring Suppl (ONETOUCH VERIO) w/Device KIT 1 each by Does not apply route 4 (four) times daily. 01/18/16  Yes Elayne Snare, MD  Cholecalciferol (VITAMIN D) 2000 UNITS CAPS Take 1 capsule (2,000 Units total) by mouth daily. 09/18/14  Yes Anaia Frith, Gwyndolyn Saxon, MD  gabapentin (NEURONTIN) 100 MG capsule Take 1 capsule (100 mg total) by mouth at bedtime. 05/30/16  Yes Virtie Bungert, Gwyndolyn Saxon, MD  Evelina Bucy BILOBA COMPLEX PO Take by mouth.   Yes [provider]  insulin aspart (NOVOLOG) 100 UNIT/ML injection Novolog U-100 Insulin aspart 100 unit/mL subcutaneous solution   Yes [provider]  Insulin Disposable Pump (V-GO 20) KIT USE ONE PER DAY 10/27/16  Yes Elayne Snare, MD  Insulin Syringe-Needle U-100 (INSULIN SYRINGE .3CC/31GX5/16") 31G X 5/16" 0.3 ML MISC Inject 1 Units as directed 2 (two) times daily. 01/27/15  Yes Leler Brion, Gwyndolyn Saxon, MD  ketoconazole (NIZORAL) 2 % cream Apply 1 application topically daily. 09/20/16  Yes Adaysha Dubinsky, Gwyndolyn Saxon, MD  loratadine (CLARITIN) 10 MG tablet Take 1 tablet (10 mg total) by mouth daily as needed for allergies. 07/13/16  Yes Gerrard Crystal, Gwyndolyn Saxon, MD  losartan (COZAAR) 100 MG tablet Take 1 tablet (100 mg total) by mouth daily. 05/30/16 05/31/17 Yes Laberta Wilbon, Gwyndolyn Saxon, MD  meloxicam (MOBIC) 15 MG tablet TAKE 1 TABLET BY MOUTH ONCE DAILY AS NEEDED FOR PAIN 01/08/17  Yes Izaha Shughart, Gwyndolyn Saxon, MD  metFORMIN (GLUCOPHAGE-XR) 500 MG 24 hr tablet Take 1  tablet (500 mg total) by mouth 2 (two) times daily with a meal. 02/20/17  Yes Harry Bark, Gwyndolyn Saxon, MD  [facility] VERIO test strip Use to test blood sugar 4 times daily 02/08/17  Yes Elayne Snare, MD  ReliOn Ultra Thin Lancets MISC Inject 1 Units as directed 3 (three) times daily as needed. 05/06/14  Yes [provider]    No Known Allergies  Past  Surgical History:  Procedure Laterality Date  . ABDOMINAL HYSTERECTOMY      Social History   Tobacco Use  . Smoking status: Never Smoker  . Smokeless tobacco: Never Used  Substance Use Topics  . Alcohol use: No    Alcohol/week: 0.6 oz    Types: 1 Standard drinks or equivalent per week  . Drug use: No    Family History  Problem Relation Age of Onset  . Breast cancer Other 40  . Alcohol abuse Father   . Mental illness Father   . Stroke Father   . Diabetes Sister   . Asthma Brother   . Diabetes Brother   . Diabetes Maternal Aunt   . Heart disease Maternal Aunt   . Diabetes Maternal Uncle   . Early death Maternal Uncle   . Breast cancer Cousin 70       mat cousin    Medication list has been reviewed and updated.  Physical Examination: BP 138/80   Pulse (!) 58   Temp 98.3 F (36.8 C) (Oral)   Resp 16   Ht '5\' 2"'$  (1.575 m)   Wt 161 lb 12.8 oz (73.4 kg)   SpO2 98%   BMI 29.59 kg/m   Physical Exam  Constitutional: She appears well-developed and well-nourished.  HENT:  OP erythema no exudate Mild B sinus tenderness  Cardiovascular: Normal rate, regular rhythm and normal heart sounds.  Pulmonary/Chest: Effort normal and breath sounds normal.  Musculoskeletal: She exhibits no edema.  Neurological: She is alert.  Skin: Skin is warm and dry.  Psychiatric: She has a normal mood and affect. Her behavior is normal.  Nursing note and vitals reviewed.   Assessment and Plan:  1. Type 2 diabetes, controlled, with neuropathy ([place]) Well controlled, followed by endo, refill metformin  2. Viral upper respiratory tract infection Sx rx, call if sxs worsen/persist  3. OSA (obstructive sleep apnea) CPAP helping  4. Benign essential HTN Well controlled on losartan/amlodipine  5. Vitamin D deficiency On supplement, followed by endo  Return if symptoms worsen or fail to improve.  [name]. Surafel Hilleary, [facility]  02/20/2017

## 2017-02-20 NOTE — Patient Instructions (Signed)
Upper Respiratory Infection, Adult Most upper respiratory infections (URIs) are caused by a virus. A URI affects the nose, throat, and upper air passages. The most common type of URI is often called "the common cold." Follow these instructions at home:  Take medicines only as told by your doctor.  Gargle warm saltwater or take cough drops to comfort your throat as told by your doctor.  Use a warm mist humidifier or inhale steam from a shower to increase air moisture. This may make it easier to breathe.  Drink enough fluid to keep your pee (urine) clear or pale yellow.  Eat soups and other clear broths.  Have a healthy diet.  Rest as needed.  Go back to work when your fever is gone or your doctor says it is okay. ? You may need to stay home longer to avoid giving your URI to others. ? You can also wear a face mask and wash your hands often to prevent spread of the virus.  Use your inhaler more if you have asthma.  Do not use any tobacco products, including cigarettes, chewing tobacco, or electronic cigarettes. If you need help quitting, ask your doctor. Contact a doctor if:  You are getting worse, not better.  Your symptoms are not helped by medicine.  You have chills.  You are getting more short of breath.  You have brown or red mucus.  You have yellow or brown discharge from your nose.  You have pain in your face, especially when you bend forward.  You have a fever.  You have puffy (swollen) neck glands.  You have pain while swallowing.  You have white areas in the back of your throat. Get help right away if:  You have very bad or constant: ? Headache. ? Ear pain. ? Pain in your forehead, behind your eyes, and over your cheekbones (sinus pain). ? Chest pain.  You have long-lasting (chronic) lung disease and any of the following: ? Wheezing. ? Long-lasting cough. ? Coughing up blood. ? A change in your usual mucus.  You have a stiff neck.  You have  changes in your: ? Vision. ? Hearing. ? Thinking. ? Mood. This information is not intended to replace advice given to you by your health care provider. Make sure you discuss any questions you have with your health care provider. Document Released: 09/06/2007 Document Revised: 11/21/2015 Document Reviewed: 06/25/2013 Elsevier Interactive Patient Education  2018 Elsevier Inc.  

## 2017-02-25 DIAGNOSIS — G4733 Obstructive sleep apnea (adult) (pediatric): Secondary | ICD-10-CM | POA: Diagnosis not present

## 2017-03-21 ENCOUNTER — Ambulatory Visit
Admission: RE | Admit: 2017-03-21 | Discharge: 2017-03-21 | Disposition: A | Discharge status: Home / Self Care | Payer: PPO | Source: Ambulatory Visit | Attending: Family Medicine | Admitting: Family Medicine

## 2017-03-21 ENCOUNTER — Encounter: Payer: Self-pay | Admitting: Family Medicine

## 2017-03-21 ENCOUNTER — Ambulatory Visit (INDEPENDENT_AMBULATORY_CARE_PROVIDER_SITE_OTHER): Payer: PPO | Admitting: Family Medicine

## 2017-03-21 VITALS — BP 130/80 | HR 60 | Ht 62.0 in | Wt 160.0 lb

## 2017-03-21 DIAGNOSIS — R0789 Other chest pain: Secondary | ICD-10-CM

## 2017-03-21 DIAGNOSIS — E1140 Type 2 diabetes mellitus with diabetic neuropathy, unspecified: Secondary | ICD-10-CM

## 2017-03-21 DIAGNOSIS — I10 Essential (primary) hypertension: Secondary | ICD-10-CM | POA: Diagnosis not present

## 2017-03-21 DIAGNOSIS — E559 Vitamin D deficiency, unspecified: Secondary | ICD-10-CM

## 2017-03-21 DIAGNOSIS — J398 Other specified diseases of upper respiratory tract: Secondary | ICD-10-CM | POA: Insufficient documentation

## 2017-03-21 DIAGNOSIS — G4733 Obstructive sleep apnea (adult) (pediatric): Secondary | ICD-10-CM

## 2017-03-21 DIAGNOSIS — R079 Chest pain, unspecified: Secondary | ICD-10-CM | POA: Diagnosis not present

## 2017-03-22 ENCOUNTER — Other Ambulatory Visit: Payer: Self-pay | Admitting: Family Medicine

## 2017-03-22 DIAGNOSIS — R0789 Other chest pain: Secondary | ICD-10-CM

## 2017-03-22 DIAGNOSIS — E1140 Type 2 diabetes mellitus with diabetic neuropathy, unspecified: Secondary | ICD-10-CM

## 2017-03-22 NOTE — Progress Notes (Signed)
Date:  03/21/2017   Name:  Melinda Ford   DOB:  [birth year]   MRN:  [id number]  PCP:  Adline Potter, MD    Chief Complaint: Shoulder Pain (hurting in R) shoulder blade- started with an itching and now is a pain)   History of Present Illness:  This is a 71 y.o. female seen for one month f/u. C/o R parascapular pain past two weeks, now constant. T2DM well controlled on metformin/insulin pump, followed by endo. OSA on CPAP, feels mask is leaking.   Review of Systems:  Review of Systems  Constitutional: Negative for chills and fever.  Respiratory: Negative for cough and shortness of breath.   Cardiovascular: Negative for chest pain and leg swelling.  Genitourinary: Negative for difficulty urinating.  Neurological: Negative for syncope and light-headedness.    Patient Active Problem List   Diagnosis Date Noted  . Primary localized osteoarthrosis of ankle and foot 09/20/2016  . Tinea versicolor 09/20/2016  . OSA (obstructive sleep apnea) 08/23/2016  . Allergic rhinitis 07/13/2016  . Radiculopathy of lumbosacral region 06/22/2016  . Right sided sciatica 03/09/2016  . Lumbar spondylosis 12/02/2015  . Overweight (BMI 25.0-29.9) 10/01/2015  . Vitamin D deficiency 09/18/2014  . Onychomycosis 09/16/2014  . Degenerative disc disease, lumbar 09/16/2014  . Benign essential HTN 10/10/2010  . Osteoporosis 10/10/2010  . Type 2 diabetes, controlled, with neuropathy ([place]) 10/10/2010    Prior to Admission medications   Medication Sig Start Date End Date Taking? Authorizing Provider  acetaminophen (TYLENOL) 500 MG tablet Take 1,000 mg by mouth 2 (two) times daily.   Yes [provider]  amLODipine (NORVASC) 10 MG tablet amlodipine 10 mg tablet   Yes [provider]  Blood Glucose Monitoring Suppl (ONETOUCH VERIO) w/Device KIT 1 each by Does not apply route 4 (four) times daily. 01/18/16  Yes Elayne Snare, MD  Cholecalciferol (VITAMIN D) 2000 UNITS CAPS Take 1 capsule (2,000  Units total) by mouth daily. 09/18/14  Yes Lindell Tussey, Gwyndolyn Saxon, MD  Evelina Bucy BILOBA COMPLEX PO Take by mouth.   Yes [provider]  insulin aspart (NOVOLOG) 100 UNIT/ML injection Novolog U-100 Insulin aspart 100 unit/mL subcutaneous solution   Yes [provider]  Insulin Disposable Pump (V-GO 20) KIT USE ONE PER DAY 10/27/16  Yes Elayne Snare, MD  Insulin Syringe-Needle U-100 (INSULIN SYRINGE .3CC/31GX5/16") 31G X 5/16" 0.3 ML MISC Inject 1 Units as directed 2 (two) times daily. 01/27/15  Yes Tylie Golonka, Gwyndolyn Saxon, MD  loratadine (CLARITIN) 10 MG tablet Take 1 tablet (10 mg total) by mouth daily as needed for allergies. 07/13/16  Yes Eldene Plocher, Gwyndolyn Saxon, MD  losartan (COZAAR) 100 MG tablet Take 1 tablet (100 mg total) by mouth daily. 05/30/16 05/31/17 Yes Brigette Hopfer, Gwyndolyn Saxon, MD  metFORMIN (GLUCOPHAGE-XR) 500 MG 24 hr tablet Take 1 tablet (500 mg total) by mouth 2 (two) times daily with a meal. 02/20/17  Yes Aubrey Voong, Gwyndolyn Saxon, MD  [facility] VERIO test strip Use to test blood sugar 4 times daily 02/08/17  Yes Elayne Snare, MD  ReliOn Ultra Thin Lancets MISC Inject 1 Units as directed 3 (three) times daily as needed. 05/06/14  Yes [provider]    No Known Allergies  Past Surgical History:  Procedure Laterality Date  . ABDOMINAL HYSTERECTOMY      Social History   Tobacco Use  . Smoking status: Never Smoker  . Smokeless tobacco: Never Used  Substance Use Topics  . Alcohol use: No    Alcohol/week: 0.6 oz    Types: 1  Standard drinks or equivalent per week  . Drug use: No    Family History  Problem Relation Age of Onset  . Breast cancer Other 40  . Alcohol abuse Father   . Mental illness Father   . Stroke Father   . Diabetes Sister   . Asthma Brother   . Diabetes Brother   . Diabetes Maternal Aunt   . Heart disease Maternal Aunt   . Diabetes Maternal Uncle   . Early death Maternal Uncle   . Breast cancer Cousin 3       mat cousin    Medication list has been reviewed and  updated.  Physical Examination: BP 130/80   Pulse 60   Ht 5' 2" (1.575 m)   Wt 160 lb (72.6 kg)   BMI 29.26 kg/m   Physical Exam  Constitutional: She appears well-developed and well-nourished.  Cardiovascular: Normal rate, regular rhythm and normal heart sounds.  Pulmonary/Chest: Effort normal and breath sounds normal.  Musculoskeletal: She exhibits no edema.  R parascapular area nontender  Neurological: She is alert.  Skin: Skin is warm and dry.  Psychiatric: She has a normal mood and affect. Her behavior is normal.  Nursing note and vitals reviewed.   Assessment and Plan:  1. Right-sided chest wall pain Unclear etiology - DG Chest 2 View; Future  2. Type 2 diabetes, controlled, with neuropathy ([place]) Well controlled on metformin/insulin pump, followed by endo  3. Benign essential HTN Well controlled on losartan/amlodipine  4. OSA (obstructive sleep apnea) On CPAP, needs mask recheck  5. Vitamin D deficiency On supplement, followed by endo  Return if symptoms worsen or fail to improve.  [name]. [facility]  03/22/2017

## 2017-03-23 ENCOUNTER — Other Ambulatory Visit: Payer: Self-pay

## 2017-03-23 DIAGNOSIS — E1140 Type 2 diabetes mellitus with diabetic neuropathy, unspecified: Secondary | ICD-10-CM

## 2017-03-24 LAB — TSH: TSH: 0.86 u[IU]/mL (ref 0.450–4.500)

## 2017-03-27 DIAGNOSIS — G4733 Obstructive sleep apnea (adult) (pediatric): Secondary | ICD-10-CM | POA: Diagnosis not present

## 2017-04-02 ENCOUNTER — Ambulatory Visit
Admission: RE | Admit: 2017-04-02 | Discharge: 2017-04-02 | Disposition: A | Discharge status: Home / Self Care | Payer: PPO | Source: Ambulatory Visit | Attending: Family Medicine | Admitting: Family Medicine

## 2017-04-02 ENCOUNTER — Ambulatory Visit: Payer: PPO

## 2017-04-02 DIAGNOSIS — R918 Other nonspecific abnormal finding of lung field: Secondary | ICD-10-CM | POA: Insufficient documentation

## 2017-04-02 DIAGNOSIS — R0789 Other chest pain: Secondary | ICD-10-CM | POA: Diagnosis not present

## 2017-04-02 DIAGNOSIS — R079 Chest pain, unspecified: Secondary | ICD-10-CM | POA: Diagnosis not present

## 2017-04-09 ENCOUNTER — Other Ambulatory Visit: Payer: Self-pay | Admitting: Family Medicine

## 2017-04-09 MED ORDER — MELOXICAM 15 MG PO TABS: 15.0000 mg | ORAL_TABLET | Freq: Every day | ORAL | 0 refills | Status: DC | PRN

## 2017-04-16 ENCOUNTER — Other Ambulatory Visit: Payer: Self-pay | Admitting: Family Medicine

## 2017-04-16 DIAGNOSIS — Z1231 Encounter for screening mammogram for malignant neoplasm of breast: Secondary | ICD-10-CM

## 2017-04-20 ENCOUNTER — Ambulatory Visit (INDEPENDENT_AMBULATORY_CARE_PROVIDER_SITE_OTHER): Payer: PPO | Admitting: Family Medicine

## 2017-04-20 ENCOUNTER — Encounter: Payer: Self-pay | Admitting: Family Medicine

## 2017-04-20 VITALS — BP 116/76 | HR 68 | Resp 16 | Ht 62.0 in | Wt 165.0 lb

## 2017-04-20 DIAGNOSIS — G4733 Obstructive sleep apnea (adult) (pediatric): Secondary | ICD-10-CM

## 2017-04-20 DIAGNOSIS — M79671 Pain in right foot: Secondary | ICD-10-CM | POA: Diagnosis not present

## 2017-04-20 DIAGNOSIS — E1140 Type 2 diabetes mellitus with diabetic neuropathy, unspecified: Secondary | ICD-10-CM

## 2017-04-20 DIAGNOSIS — E559 Vitamin D deficiency, unspecified: Secondary | ICD-10-CM | POA: Diagnosis not present

## 2017-04-20 DIAGNOSIS — E663 Overweight: Secondary | ICD-10-CM

## 2017-04-20 DIAGNOSIS — Z1231 Encounter for screening mammogram for malignant neoplasm of breast: Secondary | ICD-10-CM

## 2017-04-20 DIAGNOSIS — I10 Essential (primary) hypertension: Secondary | ICD-10-CM | POA: Diagnosis not present

## 2017-04-20 DIAGNOSIS — M7711 Lateral epicondylitis, right elbow: Secondary | ICD-10-CM | POA: Diagnosis not present

## 2017-04-20 DIAGNOSIS — Z1239 Encounter for other screening for malignant neoplasm of breast: Secondary | ICD-10-CM

## 2017-04-20 NOTE — Progress Notes (Signed)
Date:  04/20/2017   Name:  Melinda Ford   DOB:  21-Oct-1945   MRN:  [id number]  PCP:  Adline Potter, MD    Chief Complaint: Arm Pain (pain in Right arm after picking up iron skillet weeks ago.) and Foot Pain (Leaving in March to go overseas and worried about walking. Has had R foot pain for years after tendon procedure in 2009. )   History of Present Illness:  This is a 72 y.o. female seen for one month f/u. CXR showed tracheal deviation, CT unremarkable except mild interstitial fibrosis felt age related. R parascapular pain better, still bothers occasionally. DM well controlled, last a1c 6.7% in Nov, followed by endo. C/o RUE pain since lifting heavy skillet three weeks ago, not getting worse but not improving. Also c/o persistent R foot pain since surgery 2009, concerned will affect walking on vacation. Having skin itching which resolves with Benedryl but makes drowsy. Using CPAP nightly, weight up 5#, has discussed with endo. Using Mobic prn, to see back surgeon next week. Needs mammo/lipids/vit D per endo.  Review of Systems:  Review of Systems  Constitutional: Negative for chills and fever.  Respiratory: Negative for cough and shortness of breath.   Cardiovascular: Negative for chest pain and leg swelling.  Genitourinary: Negative for difficulty urinating.  Neurological: Negative for syncope and light-headedness.    Patient Active Problem List   Diagnosis Date Noted  . Primary localized osteoarthrosis of ankle and foot 09/20/2016  . Tinea versicolor 09/20/2016  . OSA (obstructive sleep apnea) 08/23/2016  . Allergic rhinitis 07/13/2016  . Radiculopathy of lumbosacral region 06/22/2016  . Right sided sciatica 03/09/2016  . Overweight (BMI 25.0-29.9) 10/01/2015  . Vitamin D deficiency 09/18/2014  . Onychomycosis 09/16/2014  . Degenerative disc disease, lumbar 09/16/2014  . Benign essential HTN 10/10/2010  . Osteoporosis 10/10/2010  . Type 2 diabetes, controlled, with neuropathy  ([place]) 10/10/2010    Prior to Admission medications   Medication Sig Start Date End Date Taking? Authorizing Provider  acetaminophen (TYLENOL) 500 MG tablet Take 1,000 mg by mouth 2 (two) times daily.   Yes [provider]  amLODipine (NORVASC) 10 MG tablet amlodipine 10 mg tablet   Yes [provider]  Blood Glucose Monitoring Suppl (ONETOUCH VERIO) w/Device KIT 1 each by Does not apply route 4 (four) times daily. 01/18/16  Yes Elayne Snare, MD  cetirizine (ZYRTEC) 10 MG tablet Take 10 mg by mouth daily as needed.   Yes [provider]  Cholecalciferol (VITAMIN D) 2000 UNITS CAPS Take 1 capsule (2,000 Units total) by mouth daily. 09/18/14  Yes Serafino Burciaga, Gwyndolyn Saxon, MD  Evelina Bucy BILOBA COMPLEX PO Take by mouth.   Yes [provider]  insulin aspart (NOVOLOG) 100 UNIT/ML injection Novolog U-100 Insulin aspart 100 unit/mL subcutaneous solution   Yes [provider]  Insulin Disposable Pump (V-GO 20) KIT USE ONE PER DAY 10/27/16  Yes Elayne Snare, MD  Insulin Syringe-Needle U-100 (INSULIN SYRINGE .3CC/31GX5/16") 31G X 5/16" 0.3 ML MISC Inject 1 Units as directed 2 (two) times daily. 01/27/15  Yes Sachit Gilman, Gwyndolyn Saxon, MD  loratadine (CLARITIN) 10 MG tablet Take 1 tablet (10 mg total) by mouth daily as needed for allergies. 07/13/16  Yes Shayne Deerman, Gwyndolyn Saxon, MD  losartan (COZAAR) 100 MG tablet Take 1 tablet (100 mg total) by mouth daily. 05/30/16 05/31/17 Yes Brilyn Tuller, Gwyndolyn Saxon, MD  meloxicam (MOBIC) 15 MG tablet Take 1 tablet (15 mg total) by mouth daily as needed for pain (for severe pain). 04/09/17  Yes Nikka Hakimian, Gwyndolyn Saxon, MD  metFORMIN (GLUCOPHAGE-XR) 500 MG 24 hr tablet Take 1 tablet (500 mg total) by mouth 2 (two) times daily with a meal. 02/20/17  Yes Shaylie Eklund, Gwyndolyn Saxon, MD  [facility] VERIO test strip Use to test blood sugar 4 times daily 02/08/17  Yes Elayne Snare, MD  ReliOn Ultra Thin Lancets MISC Inject 1 Units as directed 3 (three) times daily as needed. 05/06/14  Yes [provider]    No Known Allergies  Past Surgical History:  Procedure Laterality Date  . ABDOMINAL HYSTERECTOMY      Social History   Tobacco Use  . Smoking status: Never Smoker  . Smokeless tobacco: Never Used  Substance Use Topics  . Alcohol use: No    Alcohol/week: 0.6 oz    Types: 1 Standard drinks or equivalent per week  . Drug use: No    Family History  Problem Relation Age of Onset  . Breast cancer Other 40  . Alcohol abuse Father   . Mental illness Father   . Stroke Father   . Diabetes Sister   . Asthma Brother   . Diabetes Brother   . Diabetes Maternal Aunt   . Heart disease Maternal Aunt   . Diabetes Maternal Uncle   . Early death Maternal Uncle   . Breast cancer Cousin 26       mat cousin    Medication list has been reviewed and updated.  Physical Examination: BP 116/76   Pulse 68   Resp 16   Ht _0  (1.575 m)   Wt 165 lb (74.8 kg)   SpO2 98%   BMI 30.18 kg/m   Physical Exam  Constitutional: She appears well-developed and well-nourished.  Cardiovascular: Normal rate, regular rhythm and normal heart sounds.  Pulmonary/Chest: Effort normal and breath sounds normal.  Musculoskeletal: She exhibits no edema.  Tender over R lateral epicondyle  Neurological: She is alert.  Skin: Skin is warm and dry.  Psychiatric: She has a normal mood and affect. Her behavior is normal.  Nursing note and vitals reviewed.   Assessment and Plan:  1. Type 2 diabetes, controlled, with neuropathy ([place]) Well controlled on metformin/insulin pump, followed by endo - Lipid Profile  2. Right lateral epicondylitis Monitor, consider injection if not resolved in six weeks  3. Right foot pain S/p surgery 2009 - Ambulatory referral to Podiatry  4. Benign essential HTN Well controlled on losartan/amlodipine  5. OSA (obstructive sleep apnea) Using CPAP nightly  6. Vitamin D deficiency On supplement - Vitamin D (25 hydroxy)  7. Overweight (BMI 25.0-29.9) Discuss  further with endo, newer DM meds may help  8. Breast cancer screening - MM Digital Screening; Future  Return in about 6 months (around 10/18/2017).  [name]. [facility]  04/20/2017

## 2017-04-20 NOTE — Patient Instructions (Signed)
Try cetirizine (generic Zyrtec) as needed for itchy skin. Avoid Benedryl.

## 2017-04-21 LAB — LIPID PANEL
CHOL/HDL RATIO: 2.3 ratio (ref 0.0–4.4)
CHOLESTEROL TOTAL: 219 mg/dL — AB (ref 100–199)
HDL: 95 mg/dL (ref >39)
LDL CALC: 112 mg/dL — AB (ref 0–99)
TRIGLYCERIDES: 59 mg/dL (ref 0–149)
VLDL CHOLESTEROL CAL: 12 mg/dL (ref 5–40)

## 2017-04-21 LAB — VITAMIN D 25 HYDROXY (VIT D DEFICIENCY, FRACTURES): Vit D, 25-Hydroxy: 29.1 ng/mL — ABNORMAL LOW (ref 30.0–100.0)

## 2017-04-23 DIAGNOSIS — M47816 Spondylosis without myelopathy or radiculopathy, lumbar region: Secondary | ICD-10-CM | POA: Diagnosis not present

## 2017-04-23 DIAGNOSIS — M5136 Other intervertebral disc degeneration, lumbar region: Secondary | ICD-10-CM | POA: Diagnosis not present

## 2017-04-23 DIAGNOSIS — M5416 Radiculopathy, lumbar region: Secondary | ICD-10-CM | POA: Diagnosis not present

## 2017-04-27 DIAGNOSIS — G4733 Obstructive sleep apnea (adult) (pediatric): Secondary | ICD-10-CM | POA: Diagnosis not present

## 2017-04-30 ENCOUNTER — Telehealth: Payer: Self-pay

## 2017-04-30 NOTE — Telephone Encounter (Addendum)
Patient called and left message that she has headache and nasal congestion with sore throat. Would like to know what she can take OTC with diabetes  For cold symptoms.  Called and advised per Dr.Plonk on vm that she can do antihistamine  and Robitussin and ask pharmacist for other recommendations.

## 2017-05-01 DIAGNOSIS — H900 Conductive hearing loss, bilateral: Secondary | ICD-10-CM | POA: Diagnosis not present

## 2017-05-01 DIAGNOSIS — H6123 Impacted cerumen, bilateral: Secondary | ICD-10-CM | POA: Diagnosis not present

## 2017-05-02 ENCOUNTER — Encounter (INDEPENDENT_AMBULATORY_CARE_PROVIDER_SITE_OTHER): Payer: Self-pay

## 2017-05-02 ENCOUNTER — Ambulatory Visit
Admission: RE | Admit: 2017-05-02 | Discharge: 2017-05-02 | Disposition: A | Discharge status: Home / Self Care | Payer: PPO | Source: Ambulatory Visit | Attending: Family Medicine | Admitting: Family Medicine

## 2017-05-02 DIAGNOSIS — Z1231 Encounter for screening mammogram for malignant neoplasm of breast: Secondary | ICD-10-CM | POA: Insufficient documentation

## 2017-05-03 DIAGNOSIS — E119 Type 2 diabetes mellitus without complications: Secondary | ICD-10-CM | POA: Diagnosis not present

## 2017-05-03 LAB — HM DIABETES EYE EXAM

## 2017-05-09 ENCOUNTER — Ambulatory Visit: Payer: Self-pay | Admitting: Family Medicine

## 2017-05-10 ENCOUNTER — Ambulatory Visit: Payer: PPO | Admitting: Endocrinology

## 2017-05-14 DIAGNOSIS — M79671 Pain in right foot: Secondary | ICD-10-CM | POA: Diagnosis not present

## 2017-05-14 DIAGNOSIS — M19171 Post-traumatic osteoarthritis, right ankle and foot: Secondary | ICD-10-CM | POA: Diagnosis not present

## 2017-05-14 DIAGNOSIS — B351 Tinea unguium: Secondary | ICD-10-CM | POA: Diagnosis not present

## 2017-05-14 DIAGNOSIS — M79675 Pain in left toe(s): Secondary | ICD-10-CM | POA: Diagnosis not present

## 2017-05-14 DIAGNOSIS — M79674 Pain in right toe(s): Secondary | ICD-10-CM | POA: Diagnosis not present

## 2017-05-14 DIAGNOSIS — G8929 Other chronic pain: Secondary | ICD-10-CM | POA: Diagnosis not present

## 2017-05-14 DIAGNOSIS — E1142 Type 2 diabetes mellitus with diabetic polyneuropathy: Secondary | ICD-10-CM | POA: Diagnosis not present

## 2017-05-19 ENCOUNTER — Encounter: Payer: Self-pay | Admitting: Endocrinology

## 2017-05-22 ENCOUNTER — Other Ambulatory Visit: Payer: Self-pay | Admitting: Family Medicine

## 2017-05-22 DIAGNOSIS — G4733 Obstructive sleep apnea (adult) (pediatric): Secondary | ICD-10-CM | POA: Diagnosis not present

## 2017-05-22 DIAGNOSIS — I10 Essential (primary) hypertension: Secondary | ICD-10-CM

## 2017-05-28 DIAGNOSIS — M19171 Post-traumatic osteoarthritis, right ankle and foot: Secondary | ICD-10-CM | POA: Diagnosis not present

## 2017-05-28 DIAGNOSIS — G4733 Obstructive sleep apnea (adult) (pediatric): Secondary | ICD-10-CM | POA: Diagnosis not present

## 2017-05-29 ENCOUNTER — Ambulatory Visit: Payer: PPO | Admitting: Endocrinology

## 2017-05-31 ENCOUNTER — Encounter: Payer: Self-pay | Admitting: Endocrinology

## 2017-05-31 ENCOUNTER — Other Ambulatory Visit: Payer: Self-pay

## 2017-05-31 ENCOUNTER — Ambulatory Visit (INDEPENDENT_AMBULATORY_CARE_PROVIDER_SITE_OTHER): Payer: PPO | Admitting: Endocrinology

## 2017-05-31 ENCOUNTER — Other Ambulatory Visit: Payer: Self-pay | Admitting: Family Medicine

## 2017-05-31 VITALS — BP 138/84 | HR 70 | Ht 62.0 in | Wt 161.8 lb

## 2017-05-31 DIAGNOSIS — E1165 Type 2 diabetes mellitus with hyperglycemia: Secondary | ICD-10-CM

## 2017-05-31 DIAGNOSIS — E7800 Pure hypercholesterolemia, unspecified: Secondary | ICD-10-CM | POA: Diagnosis not present

## 2017-05-31 DIAGNOSIS — I10 Essential (primary) hypertension: Secondary | ICD-10-CM

## 2017-05-31 DIAGNOSIS — Z794 Long term (current) use of insulin: Secondary | ICD-10-CM

## 2017-05-31 DIAGNOSIS — R635 Abnormal weight gain: Secondary | ICD-10-CM

## 2017-05-31 LAB — POCT GLYCOSYLATED HEMOGLOBIN (HGB A1C): HEMOGLOBIN A1C: 6.9

## 2017-05-31 NOTE — Telephone Encounter (Signed)
Called patient at [phone] and she wanted to show Dr. Dwyane Dee her meal log and she stated that she may drop it off for [facility] and Dr. Dwyane Dee to evaluate and give her feedback to make sure her food choices were appropriate for her to consume.

## 2017-05-31 NOTE — Progress Notes (Signed)
Patient ID: Melinda Ford, female   DOB: [birth year], 72 y.o.   MRN: [id number]            Reason for Appointment: Follow-up for Type 2 Diabetes   History of Present Illness:          Date of diagnosis of type 2 diabetes mellitus: ?  2004        Background history:   She previously had been on metformin and also Avandia initially Detailed records of her care are not available prior to 2013 or so She was tried on Januvia in 2016 but this was not effective and this was stopped in 5/70 She was started on insulin in 2/16 with small doses of Lantus insulin Her A1c has been consistently over 8% since about 2013  Recent history:   INSULIN regimen is:   V-go 20 unit pump since 12/23/15, boluses 7--4-1 clicks with meals     Non-insulin hypoglycemic drugs the patient is taking are: metformin ER 500 mg bid   Her A1c is about the same at 6.9 and below 7% at least 3 times in a row  Current management, blood sugar patterns and problems identified:  She is complaining about weight gain and asking about insulin and weight gain  Although she is not having significant hypoglycemia recently has much lower blood sugars in the afternoons when she checks them and also in the evenings probably before supper  She is not monitoring her readings after meals very much and mostly in the morning  Also does not mark her blood sugars when she is doing postprandial readings  HIGHEST blood sugars are late at night around midnight and she thinks that this may be partly from nibbling on snacks after supper later in the evening  Also sometimes will forget to change her pump in the evening at the right time and may go longer than 24 hours, this may have caused her blood sugar to be 349 one night  However her FASTING readings are more stable compared to her last visit and averaging about 114  She has been told to bolus 30 minutes before eating because she is using regular insulin for economy and she may not  always do this  Her weight has leveled off for the last 7 months  She is taking about the same amount of BOLUSES for all meals but her blood sugars appear to be generally lower after lunch bolus  Lowest blood sugar was 60 at about 8:30 PM, probably before supper  She now says that if her blood sugar is sometimes higher late at night she will take an extra half tablet of metformin, otherwise taking this at breakfast and suppertime        Side effects from medications have been: nausea and diarrhea from 1000 MG metformin  Compliance with the medical regimen: fair Hypoglycemia:  level  Glucose monitoring:  done 3 times a day         Glucometer: One Touch Verio         Blood Glucose readings by download for the last 2 weeks:  Mean values apply above for all meters except median for One Touch  PRE-MEAL Fasting Lunch Dinner Bedtime Overall  Glucose range:  90  60-147  95-349    Mean/median: 129  ?     114   POST-MEAL PC Breakfast PC Lunch PC Dinner  Glucose range:   96-1 64   Mean/median:  89  Self-care:   Meal times are:  Breakfast is at 9-10 AM, dinner 7-8  Typical meal intake: Breakfast is toast, oatmeal, sometimes bacon, milk   Lunch: Sometimes hot dogs, fish sandwich Evening meal is a protein like chicken, steak, pork chop, corn, greens.           Dietician visit, most recent: 2013 CDE visit: 12/2015               Exercise:  may go to the gym 1-2/7  Weight history:  Wt Readings from Last 3 Encounters:  05/31/17 161 lb 12.8 oz (73.4 kg)  04/20/17 165 lb (74.8 kg)  03/21/17 160 lb (72.6 kg)    Glycemic control:   Lab Results  Component Value Date   HGBA1C 6.9 05/31/2017   HGBA1C 6.7 02/07/2017   HGBA1C 6.8 10/27/2016   Lab Results  Component Value Date   MICROALBUR 0.9 10/27/2016   LDLCALC 112 (H) 04/20/2017   CREATININE 0.69 10/27/2016   Lab Results  Component Value Date   MICRALBCREAT 0.8 10/27/2016    Other active problems: See review of  systems   Allergies as of 05/31/2017   No Known Allergies     Medication List        Accurate as of 05/31/17 12:57 PM. Always use your most recent med list.          acetaminophen 500 MG tablet Commonly known as:  TYLENOL Take 1,000 mg by mouth 2 (two) times daily.   amLODipine 10 MG tablet Commonly known as:  NORVASC amlodipine 10 mg tablet   cetirizine 10 MG tablet Commonly known as:  ZYRTEC Take 10 mg by mouth daily as needed.   GINKGO BILOBA COMPLEX PO Take by mouth.   INSULIN SYRINGE .3CC/31GX5/16" 31G X 5/16" 0.3 ML Misc Inject 1 Units as directed 2 (two) times daily.   loratadine 10 MG tablet Commonly known as:  CLARITIN Take 1 tablet (10 mg total) by mouth daily as needed for allergies.   losartan 100 MG tablet Commonly known as:  COZAAR Take 1 tablet by mouth daily   meloxicam 15 MG tablet Commonly known as:  MOBIC Take 1 tablet (15 mg total) by mouth daily as needed for pain (for severe pain).   metFORMIN 500 MG 24 hr tablet Commonly known as:  GLUCOPHAGE-XR Take 1 tablet (500 mg total) by mouth 2 (two) times daily with a meal.   NOVOLOG 100 UNIT/ML injection Generic drug:  insulin aspart Novolog U-100 Insulin aspart 100 unit/mL subcutaneous solution   ONETOUCH VERIO test strip Generic drug:  glucose blood Use to test blood sugar 4 times daily   ONETOUCH VERIO w/Device Kit 1 each by Does not apply route 4 (four) times daily.   ReliOn Ultra Thin Lancets Misc Inject 1 Units as directed 3 (three) times daily as needed.   V-GO 20 Kit USE ONE PER DAY   Vitamin D 2000 units Caps Take 1 capsule (2,000 Units total) by mouth daily.       Allergies: No Known Allergies  Past Medical History:  Diagnosis Date  . Allergy   . Arthritis   . Cataract   . Diabetes mellitus without complication ([place])   . Hypertension   . OSA (obstructive sleep apnea) 08/23/2016  . Osteoporosis     Past Surgical History:  Procedure Laterality Date  . ABDOMINAL  HYSTERECTOMY      Family History  Problem Relation Age of Onset  . Breast cancer Other 40  .  Alcohol abuse Father   . Mental illness Father   . Stroke Father   . Diabetes Sister   . Asthma Brother   . Diabetes Brother   . Diabetes Maternal Aunt   . Heart disease Maternal Aunt   . Diabetes Maternal Uncle   . Early death Maternal Uncle   . Breast cancer Cousin 45       mat cousin    Social History:  reports that  has never smoked. she has never used smokeless tobacco. She reports that she does not drink alcohol or use drugs.   Review of Systems  Lipid history: She is not on statin treatment, LDL  Followed by PCP   Lab Results  Component Value Date   CHOL 219 (H) 04/20/2017   HDL 95 04/20/2017   LDLCALC 112 (H) 04/20/2017   TRIG 59 04/20/2017   CHOLHDL 2.3 04/20/2017           Hypertension: on treatment for 15 yrs, on amlodipine and losartan, Blood pressure controlled Also followed by PCP  BP Readings from Last 3 Encounters:  05/31/17 138/84  04/20/17 116/76  03/21/17 130/80     Most recent foot exam: 9/17  She did have a high calcium occasionally  Lab Results  Component Value Date   CALCIUM 10.3 10/27/2016     LABS:  Office Visit on 05/31/2017  Component Date Value Ref Range Status  . Hemoglobin A1C 05/31/2017 6.9   Final    Physical Examination:  BP 138/84   Pulse 70   Ht _0  (1.575 m)   Wt 161 lb 12.8 oz (73.4 kg)   SpO2 97%   BMI 29.59 kg/m           ASSESSMENT:  Diabetes type 2, uncontrolled  On insulin And metformin See history of present illness for detailed discussion of current diabetes management, blood sugar patterns and problems identified A1c is reasonably good at 6.7 Most of her difficulty with control is related to postprandial hyperglycemia in the evenings after supper or overnight based on her compliance with diet and bolusing and meals   She is concerned about her tendency to weight gain and is asking about  alternatives to insulin and discussed that she is insulin deficient and is not getting excessive amount of insulin with average blood sugar at home about 150 Has only one low blood sugar at suppertime Also may have some tendency to low normal sugars with seeing regular insulin instead of the faster analog insulin She has done only some exercise  LIPIDS: LDL 112 Needs  a statin drug and discussed benefits of this, discussed that she cannot reduce cardio vascular risksotherwise but she is still reluctant to start this, will discuss further on her next visit   PLAN:   She will need to find the right time for changing her pump so that she can be consistent, she may try doing this in the morning She will need to reduce to boluses to 2 clicks at lunchtime instead of 3 If she is eating smaller meals otherwise she can reduce the clicks also down to 2 She may need to take her 1 click bolus if she is having a carbohydrate snack in the evening More regular exercise Not to be taking any extra metformin for high sugars at night, may potentially take A click if blood sugars over 200 Discussed that she needs to check her blood sugars after meals very consistently and not every morning to help adjust her  boluses  Although she is a good candidate for GLP-1 or SGLT 2 inhibitors concern about brand name medications costing too much Discussed action of SGLT 2 drugs on lowering glucose by decreasing kidney absorption of glucose, benefits of weight loss and lower blood pressure, possible side effects including candidiasis and dosage regimen  She does sound interested in trying this but she wants to do this after her overseas trip Discussed that we may need to adjust blood pressure medications with this also and possibly reduce bolus insulin   Patient Instructions  Reduce clicks to 2 at LUNCH  More sugars  2 hrs after meals  May click 1x for carb snacks  Check blood sugars on waking up  3/7  Also check  blood sugars about 2 hours after a meal and do this after different meals by rotation  Recommended blood sugar levels on waking up is 90-130 and about 2 hours after meal is 130-160  Please bring your blood sugar monitor to each visit, thank you  Exercise     Counseling time on subjects discussed in assessment and plan sections is over 50% of today's 25 minute visit   Elayne Snare 05/31/2017, 12:57 PM   Note: This office note was prepared with Dragon voice recognition system technology. Any transcriptional errors that result from this process are unintentional.

## 2017-05-31 NOTE — Patient Instructions (Addendum)
Reduce clicks to 2 at LUNCH  More sugars  2 hrs after meals  May click 1x for carb snacks  Check blood sugars on waking up  3/7  Also check blood sugars about 2 hours after a meal and do this after different meals by rotation  Recommended blood sugar levels on waking up is 90-130 and about 2 hours after meal is 130-160  Please bring your blood sugar monitor to each visit, thank you  Exercise

## 2017-06-04 ENCOUNTER — Telehealth: Payer: Self-pay

## 2017-06-04 ENCOUNTER — Other Ambulatory Visit: Payer: Self-pay | Admitting: Family Medicine

## 2017-06-04 DIAGNOSIS — M7711 Lateral epicondylitis, right elbow: Secondary | ICD-10-CM

## 2017-06-04 DIAGNOSIS — F40243 Fear of flying: Secondary | ICD-10-CM

## 2017-06-04 HISTORY — DX: Lateral epicondylitis, right elbow: M77.11

## 2017-06-04 MED ORDER — LORAZEPAM 1 MG PO TABS: 1.0000 mg | ORAL_TABLET | Freq: Once | ORAL | 0 refills | Status: DC | PRN

## 2017-06-04 NOTE — Telephone Encounter (Signed)
Can call in cholesterol med if she wants, she has been reluctant to take in past.

## 2017-06-04 NOTE — Telephone Encounter (Signed)
Endocrinologist asked why we are not having her on cholesterol RX. I explained last labs were not that bad. I also advised ortho and told her meds are coming to pharmacy for trip to [place].

## 2017-06-05 NOTE — Telephone Encounter (Signed)
Refusing statins 

## 2017-06-06 ENCOUNTER — Other Ambulatory Visit: Payer: Self-pay | Admitting: Endocrinology

## 2017-06-07 DIAGNOSIS — M7711 Lateral epicondylitis, right elbow: Secondary | ICD-10-CM | POA: Diagnosis not present

## 2017-06-07 DIAGNOSIS — M25521 Pain in right elbow: Secondary | ICD-10-CM | POA: Diagnosis not present

## 2017-06-25 DIAGNOSIS — G4733 Obstructive sleep apnea (adult) (pediatric): Secondary | ICD-10-CM | POA: Diagnosis not present

## 2017-07-24 ENCOUNTER — Other Ambulatory Visit: Payer: Self-pay | Admitting: Family Medicine

## 2017-07-24 ENCOUNTER — Encounter: Payer: Self-pay | Admitting: Internal Medicine

## 2017-07-24 ENCOUNTER — Ambulatory Visit: Payer: Self-pay | Admitting: Family Medicine

## 2017-07-24 ENCOUNTER — Ambulatory Visit (INDEPENDENT_AMBULATORY_CARE_PROVIDER_SITE_OTHER): Payer: PPO | Admitting: Internal Medicine

## 2017-07-24 VITALS — BP 140/78 | HR 78 | Temp 98.2°F | Ht 62.0 in | Wt 161.4 lb

## 2017-07-24 DIAGNOSIS — E1140 Type 2 diabetes mellitus with diabetic neuropathy, unspecified: Secondary | ICD-10-CM | POA: Diagnosis not present

## 2017-07-24 DIAGNOSIS — J157 Pneumonia due to Mycoplasma pneumoniae: Secondary | ICD-10-CM

## 2017-07-24 DIAGNOSIS — M810 Age-related osteoporosis without current pathological fracture: Secondary | ICD-10-CM

## 2017-07-24 MED ORDER — BENZONATATE 100 MG PO CAPS: 100.0000 mg | ORAL_CAPSULE | Freq: Three times a day (TID) | ORAL | 0 refills | Status: DC

## 2017-07-24 MED ORDER — DOXYCYCLINE HYCLATE 100 MG PO TABS: 100.0000 mg | ORAL_TABLET | Freq: Two times a day (BID) | ORAL | 0 refills | Status: AC

## 2017-07-24 MED ORDER — GUAIFENESIN-CODEINE 100-10 MG/5ML PO SYRP: 5.0000 mL | ORAL_SOLUTION | Freq: Every day | ORAL | 0 refills | Status: DC

## 2017-07-24 NOTE — Progress Notes (Signed)
Date:  07/24/2017   Name:  Melinda Ford   DOB:  [birth year]   MRN:  [id number]   Chief Complaint: Cough (Started Saturday. Chest congestion. Cannot get mucous up. No pressure or congestion in face. )  Cough  This is a new problem. The current episode started in the past 7 days. The problem has been gradually worsening. The problem occurs every few minutes. The cough is non-productive. Associated symptoms include chills, a fever and shortness of breath. Pertinent negatives include no chest pain, headaches, sore throat or wheezing. Treatments tried: tessalon perles. There is no history of asthma or COPD.   OP - she is s/p a number of years of Alendronate.  Last year it was stopped and she was started on vitamin D.  She is now anticipating a dental implant and she needs to have a DEXA.   Review of Systems  Constitutional: Positive for chills and fever.  HENT: Negative for congestion, sinus pressure, sore throat and trouble swallowing.   Eyes: Negative for visual disturbance.  Respiratory: Positive for cough, chest tightness and shortness of breath. Negative for wheezing.   Cardiovascular: Negative for chest pain and palpitations.  Gastrointestinal: Negative for abdominal pain, blood in stool, constipation, diarrhea, nausea and vomiting.  Neurological: Negative for dizziness and headaches.  Hematological: Negative for adenopathy.  Psychiatric/Behavioral: Positive for sleep disturbance. Self-injury: from cough.    Patient Active Problem List   Diagnosis Date Noted  . Right lateral epicondylitis 06/04/2017  . Primary localized osteoarthrosis of ankle and foot 09/20/2016  . Tinea versicolor 09/20/2016  . OSA (obstructive sleep apnea) 08/23/2016  . Allergic rhinitis 07/13/2016  . Radiculopathy of lumbosacral region 06/22/2016  . Right sided sciatica 03/09/2016  . Overweight (BMI 25.0-29.9) 10/01/2015  . Vitamin D deficiency 09/18/2014  . Onychomycosis 09/16/2014  . Degenerative disc  disease, lumbar 09/16/2014  . Benign essential HTN 10/10/2010  . Osteoporosis 10/10/2010  . Type 2 diabetes, controlled, with neuropathy ([place]) 10/10/2010    Prior to Admission medications   Medication Sig Start Date End Date Taking? Authorizing Provider  acetaminophen (TYLENOL) 500 MG tablet Take 1,000 mg by mouth 2 (two) times daily.   Yes [provider]  amLODipine (NORVASC) 10 MG tablet Take 1 tablet by mouth daily 06/04/17  Yes Plonk, Gwyndolyn Saxon, MD  Blood Glucose Monitoring Suppl (ONETOUCH VERIO) w/Device KIT 1 each by Does not apply route 4 (four) times daily. 01/18/16  Yes Elayne Snare, MD  cetirizine (ZYRTEC) 10 MG tablet Take 10 mg by mouth daily as needed.   Yes [provider]  Cholecalciferol (VITAMIN D) 2000 UNITS CAPS Take 1 capsule (2,000 Units total) by mouth daily. 09/18/14  Yes Plonk, Gwyndolyn Saxon, MD  Evelina Bucy BILOBA COMPLEX PO Take by mouth.   Yes [provider]  insulin aspart (NOVOLOG) 100 UNIT/ML injection Novolog U-100 Insulin aspart 100 unit/mL subcutaneous solution- insulin pump   Yes [provider]  Insulin Disposable Pump (V-GO 20) KIT USE 1 PER DAY 06/06/17  Yes Elayne Snare, MD  Insulin Syringe-Needle U-100 (INSULIN SYRINGE .3CC/31GX5/16") 31G X 5/16" 0.3 ML MISC Inject 1 Units as directed 2 (two) times daily. 01/27/15  Yes Plonk, Gwyndolyn Saxon, MD  loratadine (CLARITIN) 10 MG tablet Take 1 tablet (10 mg total) by mouth daily as needed for allergies. 07/13/16  Yes Plonk, Gwyndolyn Saxon, MD  losartan (COZAAR) 100 MG tablet Take 1 tablet by mouth daily 05/23/17  Yes Plonk, Gwyndolyn Saxon, MD  meloxicam (MOBIC) 15 MG tablet Take 1 tablet (15  mg total) by mouth daily as needed for pain (for severe pain). 04/09/17  Yes Plonk, Gwyndolyn Saxon, MD  metFORMIN (GLUCOPHAGE-XR) 500 MG 24 hr tablet Take 1 tablet (500 mg total) by mouth 2 (two) times daily with a meal. 02/20/17  Yes Plonk, Gwyndolyn Saxon, MD  [facility] VERIO test strip Use to test blood sugar 4 times daily 02/08/17  Yes Elayne Snare, MD  ReliOn Ultra Thin Lancets MISC Inject 1 Units as directed 3 (three) times daily as needed. 05/06/14  Yes [provider]    No Known Allergies  Past Surgical History:  Procedure Laterality Date  . ABDOMINAL HYSTERECTOMY      Social History   Tobacco Use  . Smoking status: Never Smoker  . Smokeless tobacco: Never Used  Substance Use Topics  . Alcohol use: No    Alcohol/week: 0.6 oz    Types: 1 Standard drinks or equivalent per week  . Drug use: No     Medication list has been reviewed and updated.  PHQ 2/9 Scores 03/21/2017 11/20/2016 02/04/2016 10/01/2015  PHQ - 2 Score 0 0 1 0  PHQ- 9 Score 0 - - -    Physical Exam  Constitutional: She is oriented to person, place, and time. She appears well-developed. No distress.  HENT:  Head: Normocephalic and atraumatic.  Neck: Normal range of motion. Neck supple.  Cardiovascular: Normal rate, regular rhythm and normal heart sounds.  Pulmonary/Chest: Effort normal. No respiratory distress. She has wheezes (few scattered). She has no rales. She exhibits no tenderness.  Abdominal: Soft. Bowel sounds are normal.  Musculoskeletal: Normal range of motion.  Lymphadenopathy:    She has no cervical adenopathy.  Neurological: She is alert and oriented to person, place, and time.  Skin: Skin is warm and dry. No rash noted.  Psychiatric: She has a normal mood and affect. Her behavior is normal. Thought content normal.    BP 140/78   Pulse 78   Temp 98.2 F (36.8 C) (Oral)   Ht _0  (1.575 m)   Wt 161 lb 6.4 oz (73.2 kg)   SpO2 99%   BMI 29.52 kg/m   Assessment and Plan: 1. CAP (community acquired pneumonia) due to Mycoplasma pneumoniae Take cough syrup at night; tessalon during the day Increase fluids - doxycycline (VIBRA-TABS) 100 MG tablet; Take 1 tablet (100 mg total) by mouth 2 (two) times daily for 10 days.  Dispense: 20 tablet; Refill: 0 - guaiFENesin-codeine (ROBITUSSIN AC) 100-10 MG/5ML syrup; Take 5 mLs  by mouth at bedtime.  Dispense: 118 mL; Refill: 0 - benzonatate (TESSALON) 100 MG capsule; Take 1 capsule (100 mg total) by mouth 3 (three) times daily.  Dispense: 20 capsule; Refill: 0  2. Age-related osteoporosis without current pathological fracture Continue Vitamin D - DG Bone Density; Future   Meds ordered this encounter  Medications  . doxycycline (VIBRA-TABS) 100 MG tablet    Sig: Take 1 tablet (100 mg total) by mouth 2 (two) times daily for 10 days.    Dispense:  20 tablet    Refill:  0  . guaiFENesin-codeine (ROBITUSSIN AC) 100-10 MG/5ML syrup    Sig: Take 5 mLs by mouth at bedtime.    Dispense:  118 mL    Refill:  0  . benzonatate (TESSALON) 100 MG capsule    Sig: Take 1 capsule (100 mg total) by mouth 3 (three) times daily.    Dispense:  20 capsule    Refill:  0    Partially dictated using  Editor, commissioning. Any errors are unintentional.  Halina Maidens, MD [facility]  07/24/2017

## 2017-07-25 ENCOUNTER — Telehealth: Payer: Self-pay

## 2017-07-25 NOTE — Telephone Encounter (Signed)
They only gave her that much because it is a 7 days supply to be taken only at night.  She should not need any more but if she does, she will need to call me after seven days have passed.

## 2017-07-25 NOTE — Telephone Encounter (Signed)
Patient called stating she was just seen yesterday and given medications. She said her [place]- would only allow her to get 35 mL of the cough syrup and she wanted to know how she would go about getting another Rx for more. Please Advise.

## 2017-07-25 NOTE — Telephone Encounter (Signed)
Patient informed on VM

## 2017-07-26 DIAGNOSIS — G4733 Obstructive sleep apnea (adult) (pediatric): Secondary | ICD-10-CM | POA: Diagnosis not present

## 2017-07-31 DIAGNOSIS — G4733 Obstructive sleep apnea (adult) (pediatric): Secondary | ICD-10-CM | POA: Diagnosis not present

## 2017-08-01 ENCOUNTER — Telehealth: Payer: Self-pay | Admitting: Internal Medicine

## 2017-08-01 ENCOUNTER — Ambulatory Visit
Admission: RE | Admit: 2017-08-01 | Discharge: 2017-08-01 | Disposition: A | Discharge status: Home / Self Care | Payer: PPO | Source: Ambulatory Visit | Attending: Internal Medicine | Admitting: Internal Medicine

## 2017-08-01 DIAGNOSIS — M810 Age-related osteoporosis without current pathological fracture: Secondary | ICD-10-CM | POA: Insufficient documentation

## 2017-08-01 DIAGNOSIS — Z780 Asymptomatic menopausal state: Secondary | ICD-10-CM | POA: Diagnosis not present

## 2017-08-01 NOTE — Telephone Encounter (Signed)
No VM at either number I called cell phone twice and land line twice.

## 2017-08-01 NOTE — Telephone Encounter (Signed)
Ms Mastrangelo called to say that [facility] would not release all the prescribe medication for benzonatate (TESSALON) 100 MG capsule [[id number]]

## 2017-08-10 ENCOUNTER — Other Ambulatory Visit: Payer: Self-pay | Admitting: Endocrinology

## 2017-08-17 ENCOUNTER — Telehealth: Payer: Self-pay | Admitting: Internal Medicine

## 2017-08-17 NOTE — Telephone Encounter (Signed)
Called to schedule Medicare Annual Wellness Visit with Nurse Health Advisor. If patient returns call, please note: their last AWV was on 8/20 /18 please schedule AWV with [facility] any date after Nov 20 2017  Thank you! For any questions please contact: Jill Alexanders [phone]  Skype [email or web address]

## 2017-08-25 DIAGNOSIS — G4733 Obstructive sleep apnea (adult) (pediatric): Secondary | ICD-10-CM | POA: Diagnosis not present

## 2017-08-29 IMAGING — MG MM DIGITAL SCREENING BILAT W/ CAD
1 series · 5 of 5 positions shown · non-contrast
Comparison: Previous exam(s).

CLINICAL DATA: Screening.

EXAM:
DIGITAL SCREENING BILATERAL MAMMOGRAM WITH CAD

[R CC · right · 5 of 5 slices shown]
[im 1/5]
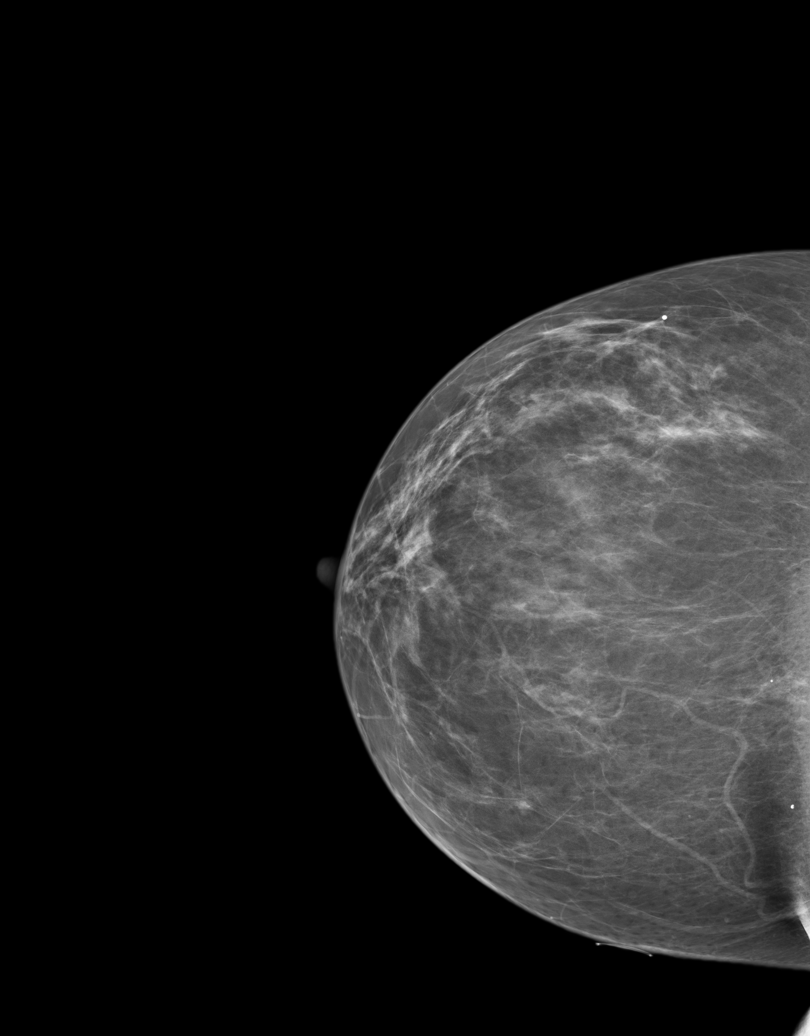
[im 2/5]
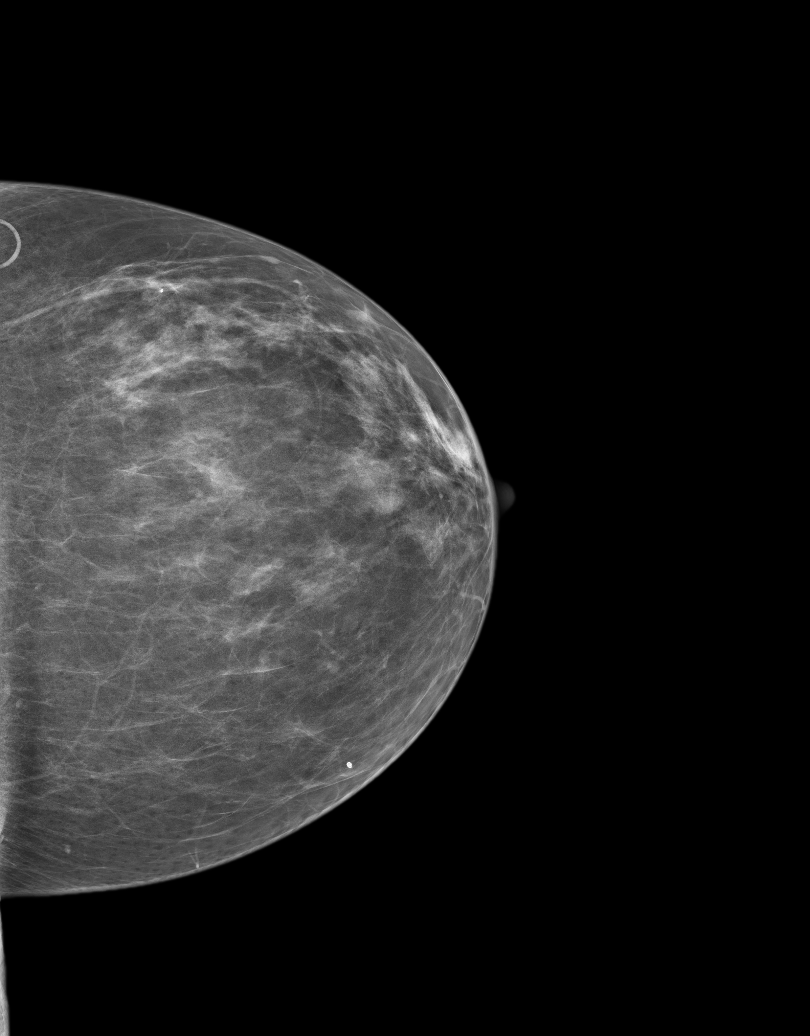
[im 3/5]
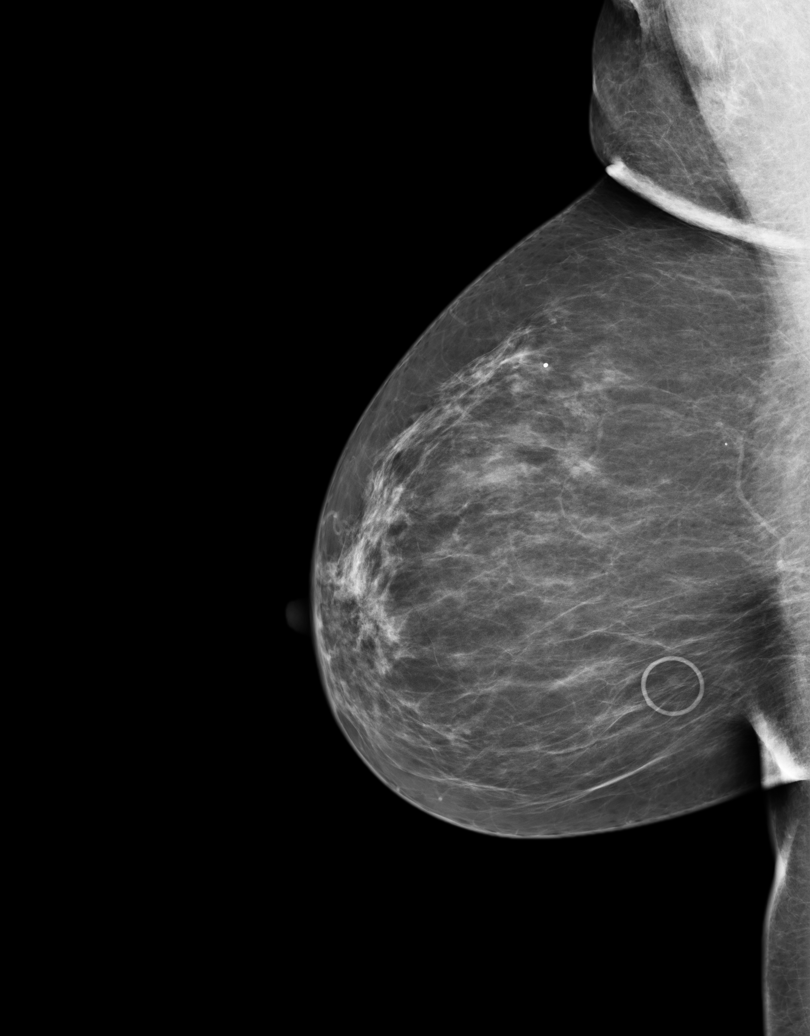
[im 4/5]
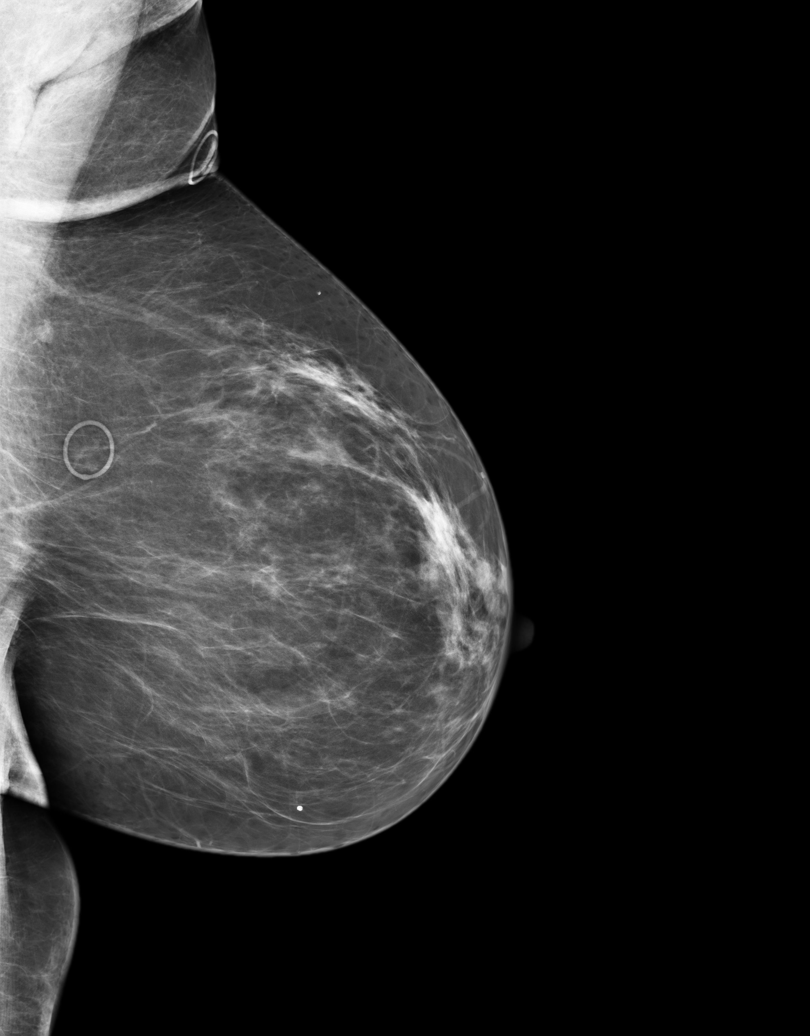
[im 5/5]
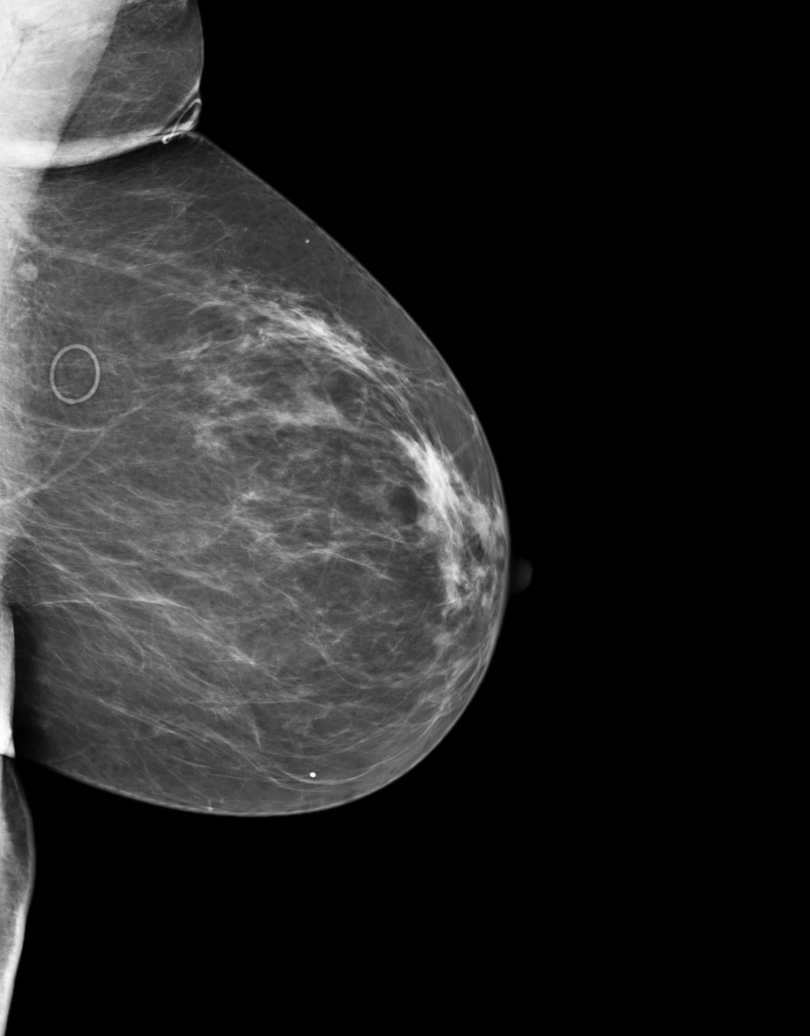

[5 of 5 positions shown; findings below may reference images not displayed]

ACR Breast Density Category b: There are scattered areas of
fibroglandular density.
FINDINGS: There are no findings suspicious for malignancy. Images were
processed with CAD.
IMPRESSION: No mammographic evidence of malignancy. A result letter of this
screening mammogram will be mailed directly to the patient.

RECOMMENDATION:
Screening mammogram in one year. (Code:AS-G-LCT)

BI-RADS CATEGORY  1: Negative.

## 2017-08-31 ENCOUNTER — Ambulatory Visit: Payer: PPO | Admitting: Endocrinology

## 2017-09-07 ENCOUNTER — Encounter: Payer: Self-pay | Admitting: Endocrinology

## 2017-09-07 ENCOUNTER — Ambulatory Visit (INDEPENDENT_AMBULATORY_CARE_PROVIDER_SITE_OTHER): Payer: PPO | Admitting: Endocrinology

## 2017-09-07 VITALS — BP 130/70 | HR 63 | Ht 62.0 in | Wt 161.6 lb

## 2017-09-07 DIAGNOSIS — I10 Essential (primary) hypertension: Secondary | ICD-10-CM

## 2017-09-07 DIAGNOSIS — E1140 Type 2 diabetes mellitus with diabetic neuropathy, unspecified: Secondary | ICD-10-CM | POA: Diagnosis not present

## 2017-09-07 LAB — POCT GLYCOSYLATED HEMOGLOBIN (HGB A1C): Hemoglobin A1C: 7 % — AB (ref 4.0–5.6)

## 2017-09-07 MED ORDER — CANAGLIFLOZIN 100 MG PO TABS: ORAL_TABLET | ORAL | 3 refills | Status: DC

## 2017-09-07 NOTE — Patient Instructions (Addendum)
Extra click with fast food  Change pump same time  Invokana: take 1 less click per meal  Losartan 1/2 with this

## 2017-09-07 NOTE — Progress Notes (Signed)
Patient ID: Melinda Ford, female   DOB: 04-27-1945, 72 y.o.   MRN: [id number]            Reason for Appointment: Follow-up for Type 2 Diabetes   History of Present Illness:          Date of diagnosis of type 2 diabetes mellitus: ?  2004        Background history:   She previously had been on metformin and also Avandia initially Detailed records of her care are not available prior to 2013 or so She was tried on Januvia in 2016 but this was not effective and this was stopped in 5/70 She was started on insulin in 2/16 with small doses of Lantus insulin Her A1c has been consistently over 8% since about 2013  Recent history:   INSULIN regimen is:   V-go 20 unit pump since 12/23/15, boluses 5--0-0 clicks with meals     Non-insulin hypoglycemic drugs the patient is taking are: metformin ER 500 mg bid   Her A1c is about the same at  7% and about the same least 3 times in a row  Current management, blood sugar patterns and problems identified:  She again has difficulty controlling postprandial readings consistently  Has variable compliance with diet and may be tending to eat out at fast food restaurants more regularly  She does not adjust her insulin based on what she is eating  Also still has tendency to snack late at night causing blood sugars to be nearly 300 at times  Occasionally she will forget to change her insulin pump late at night when she is staying overnight with her father  FASTING readings are however relatively good and averaging under 130 and as low as 95  She was concerned about her difficulty losing weight and again is asking about medications to help with this and in general reduce her medications  POSTPRANDIAL readings in the afternoon can be as high as 225, however checking infrequently and not clear which readings are after evening meal  On an average her blood sugars are higher than on her last visit  She is only occasionally going to the gym about twice a  week  Weight is about the same  No hypoglycemia with lowest blood sugar 75 midday        Side effects from medications have been: nausea and diarrhea from 1000 MG metformin  Compliance with the medical regimen: fair Hypoglycemia:  level  Glucose monitoring:  done 3 times a day         Glucometer: One Touch Verio         Blood Glucose readings by download for the last 2 weeks:  Mean values apply above for all meters except median for One Touch  PRE-MEAL Fasting Lunch Dinner Bedtime Overall  Glucose range:  93-188  75-266   112-296   Mean/median:  125    146  134+/-54    Previous readings:  Mean values apply above for all meters except median for One Touch  PRE-MEAL Fasting Lunch Dinner Bedtime Overall  Glucose range:  90  60-147  95-349    Mean/median: 129  ?     114   POST-MEAL PC Breakfast PC Lunch PC Dinner  Glucose range:   96-1 64   Mean/median:  89       Self-care:   Meal times are:  Breakfast is at 9-10 AM, dinner 7-8, lunch  Typical meal intake: Breakfast is toast, oatmeal, sometimes  bacon, milk   Lunch: Sometimes hot dogs, fish sandwich Evening meal is a protein like chicken, steak, pork chop, corn, greens.           Dietician visit, most recent: 2013 CDE visit: 12/2015               Exercise:  may go to the gym 1-2/7  Weight history:  Wt Readings from Last 3 Encounters:  09/07/17 161 lb 9.6 oz (73.3 kg)  07/24/17 161 lb 6.4 oz (73.2 kg)  05/31/17 161 lb 12.8 oz (73.4 kg)    Glycemic control:   Lab Results  Component Value Date   HGBA1C 7.0 (A) 09/07/2017   HGBA1C 6.9 05/31/2017   HGBA1C 6.7 02/07/2017   Lab Results  Component Value Date   MICROALBUR 0.9 10/27/2016   LDLCALC 112 (H) 04/20/2017   CREATININE 0.69 10/27/2016   Lab Results  Component Value Date   MICRALBCREAT 0.8 10/27/2016    Other active problems: See review of systems   Allergies as of 09/07/2017   No Known Allergies     Medication List        Accurate as of  09/07/17  4:50 PM. Always use your most recent med list.          acetaminophen 500 MG tablet Commonly known as:  TYLENOL Take 1,000 mg by mouth 2 (two) times daily.   amLODipine 10 MG tablet Commonly known as:  NORVASC Take 1 tablet by mouth daily   benzonatate 100 MG capsule Commonly known as:  TESSALON Take 1 capsule (100 mg total) by mouth 3 (three) times daily.   canagliflozin 100 MG Tabs tablet Commonly known as:  INVOKANA 1 tablet before breakfast   cetirizine 10 MG tablet Commonly known as:  ZYRTEC Take 10 mg by mouth daily as needed.   GINKGO BILOBA COMPLEX PO Take by mouth.   guaiFENesin-codeine 100-10 MG/5ML syrup Commonly known as:  ROBITUSSIN AC Take 5 mLs by mouth at bedtime.   losartan 100 MG tablet Commonly known as:  COZAAR Take 1 tablet by mouth daily   meloxicam 15 MG tablet Commonly known as:  MOBIC Take 1 tablet (15 mg total) by mouth daily as needed for pain (for severe pain).   metFORMIN 500 MG 24 hr tablet Commonly known as:  GLUCOPHAGE-XR Take 1 tablet by mouth twice a day with meals   NOVOLOG 100 UNIT/ML injection Generic drug:  insulin aspart Novolog U-100 Insulin aspart 100 unit/mL subcutaneous solution- insulin pump   ONETOUCH VERIO test strip Generic drug:  glucose blood Use to test blood sugar 4 times daily   ONETOUCH VERIO w/Device Kit 1 each by Does not apply route 4 (four) times daily.   ReliOn Ultra Thin Lancets Misc Inject 1 Units as directed 3 (three) times daily as needed.   V-GO 20 Kit USE 1 PER DAY   Vitamin D 2000 units Caps Take 1 capsule (2,000 Units total) by mouth daily.       Allergies: No Known Allergies  Past Medical History:  Diagnosis Date  . Allergy   . Arthritis   . Cataract   . Diabetes mellitus without complication ([place])   . Hypertension   . OSA (obstructive sleep apnea) 08/23/2016  . Osteoporosis     Past Surgical History:  Procedure Laterality Date  . ABDOMINAL HYSTERECTOMY       Family History  Problem Relation Age of Onset  . Breast cancer Other 40  . Alcohol abuse Father   .  Mental illness Father   . Stroke Father   . Diabetes Sister   . Asthma Brother   . Diabetes Brother   . Diabetes Maternal Aunt   . Heart disease Maternal Aunt   . Diabetes Maternal Uncle   . Early death Maternal Uncle   . Breast cancer Cousin 71       mat cousin    Social History:  reports that she has never smoked. She has never used smokeless tobacco. She reports that she does not drink alcohol or use drugs.   Review of Systems  Lipid history: She is not on statin treatment, LDL  Followed by PCP   Lab Results  Component Value Date   CHOL 219 (H) 04/20/2017   HDL 95 04/20/2017   LDLCALC 112 (H) 04/20/2017   TRIG 59 04/20/2017   CHOLHDL 2.3 04/20/2017           Hypertension: on treatment for 15 yrs, on amlodipine and losartan, Blood pressure controlled  Also followed by PCP  BP Readings from Last 3 Encounters:  09/07/17 130/70  07/24/17 140/78  05/31/17 138/84     Most recent foot exam: 6/19  She did have a high calcium in the past  Lab Results  Component Value Date   CALCIUM 10.3 10/27/2016     LABS:  Office Visit on 09/07/2017  Component Date Value Ref Range Status  . Hemoglobin A1C 09/07/2017 7.0* 4.0 - 5.6 % Final    Physical Examination:  BP 130/70 (BP Location: Left Arm, Patient Position: Sitting, Cuff Size: Normal)   Pulse 63   Ht '5\' 2"'$  (1.575 m)   Wt 161 lb 9.6 oz (73.3 kg)   SpO2 97%   BMI 29.56 kg/m        Diabetic Foot Exam - Simple   Simple Foot Form Diabetic Foot exam was performed with the following findings:  Yes   Visual Inspection No deformities, no ulcerations, no other skin breakdown bilaterally:  Yes Sensation Testing Intact to touch and monofilament testing bilaterally:  Yes Pulse Check Posterior Tibialis and Dorsalis pulse intact bilaterally:  Yes Comments     No pedal edema     ASSESSMENT:  Diabetes  type 2  See history of present illness for detailed discussion of current diabetes management, blood sugar patterns and problems identified  She is being treated with an insulin pump/V-go and metformin  A1c is reasonably good at 7 and fairly stable although slightly higher than before  Most of her hyperglycemia again is after meals or snacks and this is variable with no consistent pattern She is also not able to be consistent with diet with eating out more She is not adjusting her boluses based on what she is eating especially when eating out Also with her evening snack she is not sometimes getting enough insulin with blood sugars going up to nearly 300  Also rarely may not change her pump at bedtime as she does not have it available  She is very concerned about her difficulty losing weight and getting heavier with starting insulin regimen with the pump  LIPIDS: LDL 112  Still refusing to consider statin drug despite explaining benefits  HYPERTENSION: Well controlled Needs follow-up microalbumin   PLAN:   She will be given a trial of Invokana 100 mg daily  Discussed action of SGLT 2 drugs on lowering glucose by decreasing kidney absorption of glucose, benefits of weight loss and lower blood pressure, possible side effects including candidiasis and dosage regimen  Discussed long-term benefits also with potential cardiovascular protection  Given 30-day free coupon for Invokana to try  However discussed that with starting Invokana she may require less insulin and can at least cut down 2 units for her mealtime dose  Also discussed that if she has low sugars on waking up we may have to stop her metformin or not continue Invokana  Still needs to take at least 2 units more for eating fast food sandwiches  She will at the same time cut her losartan to half a tablet  She will need to be seen in 6 weeks for short-term follow-up To have a consistent exercise regimen, more than twice a  week and she can start walking   Patient Instructions  Extra click with fast food  Change pump same time  Invokana: take 1 less click per meal  Losartan 1/2 with this     Counseling time on subjects discussed in assessment and plan sections is over 50% of today's 25 minute visit   Elayne Snare 09/07/2017, 4:50 PM   Note: This office note was prepared with Dragon voice recognition system technology. Any transcriptional errors that result from this process are unintentional.

## 2017-09-13 ENCOUNTER — Other Ambulatory Visit: Payer: Self-pay | Admitting: Family Medicine

## 2017-09-14 ENCOUNTER — Telehealth: Payer: Self-pay | Admitting: Endocrinology

## 2017-09-14 NOTE — Telephone Encounter (Signed)
Patient want to know which medication should she break in half, the amLODipine or the losartan. Please advise

## 2017-09-17 ENCOUNTER — Ambulatory Visit: Payer: Self-pay | Admitting: Family Medicine

## 2017-09-17 ENCOUNTER — Other Ambulatory Visit: Payer: Self-pay

## 2017-09-17 NOTE — Telephone Encounter (Signed)
Pt called and notified that per Dr. Dwyane Dee note from last office visit, it is the Losartan that she should be cutting in half, NOT that Amlodipine. Pt verbalized understanding of this. Sig on Losartan was updated in pt med list.

## 2017-09-24 DIAGNOSIS — M79674 Pain in right toe(s): Secondary | ICD-10-CM | POA: Diagnosis not present

## 2017-09-24 DIAGNOSIS — M79675 Pain in left toe(s): Secondary | ICD-10-CM | POA: Diagnosis not present

## 2017-09-24 DIAGNOSIS — B351 Tinea unguium: Secondary | ICD-10-CM | POA: Diagnosis not present

## 2017-09-26 ENCOUNTER — Other Ambulatory Visit: Payer: Self-pay | Admitting: Endocrinology

## 2017-10-10 ENCOUNTER — Encounter: Payer: Self-pay | Admitting: Internal Medicine

## 2017-10-11 ENCOUNTER — Encounter: Payer: Self-pay | Admitting: Internal Medicine

## 2017-10-11 ENCOUNTER — Ambulatory Visit (INDEPENDENT_AMBULATORY_CARE_PROVIDER_SITE_OTHER): Payer: PPO | Admitting: Internal Medicine

## 2017-10-11 VITALS — BP 132/86 | HR 74 | Ht 62.0 in | Wt 158.0 lb

## 2017-10-11 DIAGNOSIS — G4733 Obstructive sleep apnea (adult) (pediatric): Secondary | ICD-10-CM

## 2017-10-11 DIAGNOSIS — E1140 Type 2 diabetes mellitus with diabetic neuropathy, unspecified: Secondary | ICD-10-CM | POA: Diagnosis not present

## 2017-10-11 DIAGNOSIS — M7711 Lateral epicondylitis, right elbow: Secondary | ICD-10-CM | POA: Diagnosis not present

## 2017-10-11 DIAGNOSIS — I10 Essential (primary) hypertension: Secondary | ICD-10-CM | POA: Diagnosis not present

## 2017-10-11 NOTE — Progress Notes (Signed)
Date:  10/11/2017   Name:  Melinda Ford   DOB:  1945-05-30   MRN:  [id number]   Chief Complaint: Elbow Injury (Pt stated lifting cast iron--weakness, sore--since last Dr. Vicente Masson) Hypertension  This is a chronic problem. The problem is unchanged. The problem is controlled. Pertinent negatives include no chest pain, headaches, palpitations or shortness of breath. Past treatments include calcium channel blockers and angiotensin blockers (losartan cut in half since starting Invokana). The current treatment provides significant improvement.  Arm Pain   The incident occurred at home. The injury mechanism was twisted. The pain is present in the right elbow. The quality of the pain is described as aching. The pain is mild. The pain has been worsening (had improved since January but now worse recently) since the incident. Pertinent negatives include no chest pain. She has tried acetaminophen and NSAIDs for the symptoms.  Diabetes  She presents for her follow-up diabetic visit. She has type 2 diabetes mellitus. Her disease course has been improving. Pertinent negatives for hypoglycemia include no dizziness, headaches or nervousness/anxiousness. Pertinent negatives for diabetes include no chest pain and no fatigue. Current diabetic treatment includes intensive insulin program and oral agent (dual therapy) (invokana is expensive).  OSA - on CPAP since last year.  She is now getting billed for high cost of supplies.    Review of Systems  Constitutional: Negative for chills, fatigue and fever.  Respiratory: Negative for cough, chest tightness, shortness of breath and wheezing.   Cardiovascular: Negative for chest pain and palpitations.  Musculoskeletal: Positive for arthralgias (right elbow).  Neurological: Negative for dizziness, light-headedness and headaches.  Psychiatric/Behavioral: Negative for agitation and hallucinations. The patient is not nervous/anxious.     Patient Active Problem List   Diagnosis Date Noted  . Right lateral epicondylitis 06/04/2017  . Primary localized osteoarthrosis of ankle and foot 09/20/2016  . Tinea versicolor 09/20/2016  . OSA (obstructive sleep apnea) 08/23/2016  . Allergic rhinitis 07/13/2016  . Radiculopathy of lumbosacral region 06/22/2016  . Right sided sciatica 03/09/2016  . Overweight (BMI 25.0-29.9) 10/01/2015  . Vitamin D deficiency 09/18/2014  . Onychomycosis 09/16/2014  . Degenerative disc disease, lumbar 09/16/2014  . Benign essential HTN 10/10/2010  . Osteoporosis 10/10/2010  . Type 2 diabetes, controlled, with neuropathy ([place]) 10/10/2010    Prior to Admission medications   Medication Sig Start Date End Date Taking? Authorizing Provider  acetaminophen (TYLENOL) 500 MG tablet Take 1,000 mg by mouth 2 (two) times daily.    [provider]  amLODipine (NORVASC) 10 MG tablet Take 1 tablet by mouth daily 06/04/17   Plonk, Gwyndolyn Saxon, MD  Blood Glucose Monitoring Suppl (ONETOUCH VERIO) w/Device KIT 1 each by Does not apply route 4 (four) times daily. 01/18/16   Elayne Snare, MD  canagliflozin [facility]) 100 MG TABS tablet 1 tablet before breakfast 09/07/17   Elayne Snare, MD  cetirizine (ZYRTEC) 10 MG tablet Take 10 mg by mouth daily as needed.    [provider]  Cholecalciferol (VITAMIN D) 2000 UNITS CAPS Take 1 capsule (2,000 Units total) by mouth daily. 09/18/14   Plonk, Gwyndolyn Saxon, MD  GINKGO BILOBA COMPLEX PO Take by mouth.    [provider]  insulin aspart (NOVOLOG) 100 UNIT/ML injection Novolog U-100 Insulin aspart 100 unit/mL subcutaneous solution- insulin pump    [provider]  Insulin Disposable Pump (V-GO 20) KIT USE 1 PER DAY 06/06/17   Elayne Snare, MD  losartan (COZAAR) 100 MG tablet Take 1 tablet by  mouth daily Patient taking differently: Take 1 half tablet by mouth daily 05/23/17   Plonk, Gwyndolyn Saxon, MD  meloxicam (MOBIC) 15 MG tablet TAKE 1 TABLET BY MOUTH ONCE DAILY AS NEEDED FOR PAIN (SEVERE PAIN)  09/14/17   Glean Hess, MD  metFORMIN (GLUCOPHAGE-XR) 500 MG 24 hr tablet Take 1 tablet by mouth twice a day with meals 08/10/17   Elayne Snare, MD  NOVOLIN R RELION 100 UNIT/ML injection 75 UNITS PER THE V-GO PUMP 09/26/17   Elayne Snare, MD  [facility] VERIO test strip Use to test blood sugar 4 times daily 02/08/17   Elayne Snare, MD  ReliOn Ultra Thin Lancets MISC Inject 1 Units as directed 3 (three) times daily as needed. 05/06/14   [provider]    No Known Allergies  Past Surgical History:  Procedure Laterality Date  . ABDOMINAL HYSTERECTOMY      Social History   Tobacco Use  . Smoking status: Never Smoker  . Smokeless tobacco: Never Used  Substance Use Topics  . Alcohol use: No    Alcohol/week: 0.6 oz    Types: 1 Standard drinks or equivalent per week  . Drug use: No     Medication list has been reviewed and updated.  Current Meds  Medication Sig  . acetaminophen (TYLENOL) 500 MG tablet Take 1,000 mg by mouth 2 (two) times daily.  Marland Kitchen amLODipine (NORVASC) 10 MG tablet Take 1 tablet by mouth daily  . Blood Glucose Monitoring Suppl (ONETOUCH VERIO) w/Device KIT 1 each by Does not apply route 4 (four) times daily.  . canagliflozin (INVOKANA) 100 MG TABS tablet 1 tablet before breakfast  . cetirizine (ZYRTEC) 10 MG tablet Take 10 mg by mouth daily as needed.  . Cholecalciferol (VITAMIN D) 2000 UNITS CAPS Take 1 capsule (2,000 Units total) by mouth daily.  Marland Kitchen GINKGO BILOBA COMPLEX PO Take by mouth.  Marland Kitchen GLUCOSAMINE HCL PO Take by mouth.  . insulin aspart (NOVOLOG) 100 UNIT/ML injection Novolog U-100 Insulin aspart 100 unit/mL subcutaneous solution- insulin pump  . Insulin Disposable Pump (V-GO 20) KIT USE 1 PER DAY  . losartan (COZAAR) 100 MG tablet Take 1 tablet by mouth daily (Patient taking differently: Take 1 half tablet by mouth daily)  . meloxicam (MOBIC) 15 MG tablet TAKE 1 TABLET BY MOUTH ONCE DAILY AS NEEDED FOR PAIN (SEVERE PAIN)  . metFORMIN (GLUCOPHAGE-XR)  500 MG 24 hr tablet Take 1 tablet by mouth twice a day with meals  . NOVOLIN R RELION 100 UNIT/ML injection 75 UNITS PER THE V-GO PUMP  . ONETOUCH VERIO test strip Use to test blood sugar 4 times daily  . ReliOn Ultra Thin Lancets MISC Inject 1 Units as directed 3 (three) times daily as needed.    PHQ 2/9 Scores 03/21/2017 11/20/2016 02/04/2016 10/01/2015  PHQ - 2 Score 0 0 1 0  PHQ- 9 Score 0 - - -    Physical Exam  Constitutional: She is oriented to person, place, and time. She appears well-developed. No distress.  HENT:  Head: Normocephalic and atraumatic.  Neck: Normal range of motion. Neck supple.  Cardiovascular: Normal rate, regular rhythm and normal heart sounds.  Pulmonary/Chest: Effort normal and breath sounds normal. No respiratory distress.  Musculoskeletal: Normal range of motion.  Neurological: She is alert and oriented to person, place, and time.  Skin: Skin is warm and dry. No rash noted.  Psychiatric: She has a normal mood and affect. Her behavior is normal. Thought content normal.  Nursing note and  vitals reviewed.   BP 132/86   Pulse 74   Ht '5\' 2"'$  (1.575 m)   Wt 158 lb (71.7 kg)   SpO2 97%   BMI 28.90 kg/m   Assessment and Plan: 1. Benign essential HTN controlled  2. Right lateral epicondylitis Recommend topical rubs three times a day If no improvement in 2 weeks, will refer to Ortho  3. Type 2 diabetes, controlled, with neuropathy ([place]) Followed by Endocrinopathy  4. OSA (obstructive sleep apnea) Continue CPAP [facility] and [facility] working on billing issues   No orders of the defined types were placed in this encounter.   Partially dictated using Editor, commissioning. Any errors are unintentional.  Halina Maidens, MD [facility]  10/11/2017   There are no diagnoses linked to this encounter.

## 2017-10-15 ENCOUNTER — Ambulatory Visit: Payer: Self-pay | Admitting: Internal Medicine

## 2017-10-16 DIAGNOSIS — H900 Conductive hearing loss, bilateral: Secondary | ICD-10-CM | POA: Diagnosis not present

## 2017-10-16 DIAGNOSIS — H6123 Impacted cerumen, bilateral: Secondary | ICD-10-CM | POA: Diagnosis not present

## 2017-10-17 ENCOUNTER — Other Ambulatory Visit: Payer: Self-pay | Admitting: Endocrinology

## 2017-10-18 ENCOUNTER — Telehealth: Payer: Self-pay | Admitting: Endocrinology

## 2017-10-18 ENCOUNTER — Other Ambulatory Visit: Payer: Self-pay

## 2017-10-18 NOTE — Telephone Encounter (Signed)
Patient called about her V-go 20 She stated she Is needing a refill sent for her pods.  Please advise        Troy Hungerford, [place]

## 2017-10-18 NOTE — Telephone Encounter (Signed)
Rx sent 

## 2017-10-25 DIAGNOSIS — G4733 Obstructive sleep apnea (adult) (pediatric): Secondary | ICD-10-CM | POA: Diagnosis not present

## 2017-10-31 ENCOUNTER — Telehealth: Payer: Self-pay

## 2017-10-31 ENCOUNTER — Other Ambulatory Visit: Payer: Self-pay | Admitting: Internal Medicine

## 2017-10-31 DIAGNOSIS — M7711 Lateral epicondylitis, right elbow: Secondary | ICD-10-CM

## 2017-10-31 NOTE — Telephone Encounter (Signed)
Spoke with patient. She would like to be referred. Agrees to see [place].

## 2017-10-31 NOTE — Telephone Encounter (Signed)
Only 1 meloxicam per day.   An xray will not be helpful and if she goes to Ortho, they may get one anyway.  Is she ready to be referred?

## 2017-10-31 NOTE — Telephone Encounter (Signed)
Patient called stating she was just seen two weeks ago or her Rt elbow. It is still no better. She stated she did not have a Xray done and wanted to know if we could order one before seeing ortho?  She also wants to know how much meloxicam she can take daily.  Please Advise.

## 2017-11-02 ENCOUNTER — Ambulatory Visit (INDEPENDENT_AMBULATORY_CARE_PROVIDER_SITE_OTHER): Payer: PPO | Admitting: Endocrinology

## 2017-11-02 ENCOUNTER — Encounter: Payer: Self-pay | Admitting: Endocrinology

## 2017-11-02 VITALS — BP 138/74 | HR 68 | Ht 62.0 in | Wt 157.2 lb

## 2017-11-02 DIAGNOSIS — E1165 Type 2 diabetes mellitus with hyperglycemia: Secondary | ICD-10-CM | POA: Diagnosis not present

## 2017-11-02 DIAGNOSIS — E7800 Pure hypercholesterolemia, unspecified: Secondary | ICD-10-CM | POA: Diagnosis not present

## 2017-11-02 DIAGNOSIS — Z794 Long term (current) use of insulin: Secondary | ICD-10-CM

## 2017-11-02 DIAGNOSIS — E1140 Type 2 diabetes mellitus with diabetic neuropathy, unspecified: Secondary | ICD-10-CM

## 2017-11-02 LAB — BASIC METABOLIC PANEL
BUN: 25 mg/dL — ABNORMAL HIGH (ref 6–23)
CHLORIDE: 107 meq/L (ref 96–112)
CO2: 25 meq/L (ref 19–32)
Calcium: 10.3 mg/dL (ref 8.4–10.5)
Creatinine, Ser: 0.89 mg/dL (ref 0.40–1.20)
GFR: 79.93 mL/min (ref >60.00)
GLUCOSE: 131 mg/dL — AB (ref 70–99)
POTASSIUM: 4.3 meq/L (ref 3.5–5.1)
SODIUM: 139 meq/L (ref 135–145)

## 2017-11-02 LAB — LIPID PANEL
CHOLESTEROL: 206 mg/dL — AB (ref 0–200)
HDL: 78.7 mg/dL (ref >39.00)
LDL Cholesterol: 113 mg/dL — ABNORMAL HIGH (ref 0–99)
NONHDL: 127.55
Total CHOL/HDL Ratio: 3
Triglycerides: 73 mg/dL (ref 0.0–149.0)
VLDL: 14.6 mg/dL (ref 0.0–40.0)

## 2017-11-02 LAB — MICROALBUMIN / CREATININE URINE RATIO
CREATININE, U: 68.9 mg/dL
MICROALB UR: 1.1 mg/dL (ref 0.0–1.9)
Microalb Creat Ratio: 1.5 mg/g (ref 0.0–30.0)

## 2017-11-02 MED ORDER — PRAVASTATIN SODIUM 20 MG PO TABS: 20.0000 mg | ORAL_TABLET | Freq: Every day | ORAL | 3 refills | Status: DC

## 2017-11-02 NOTE — Progress Notes (Signed)
Patient ID: Melinda Ford, female   DOB: [birth year], 72 y.o.   MRN: [id number]            Reason for Appointment: Follow-up for Type 2 Diabetes   History of Present Illness:          Date of diagnosis of type 2 diabetes mellitus: ?  2004        Background history:   She previously had been on metformin and also Avandia initially Detailed records of her care are not available prior to 2013 or so She was tried on Januvia in 2016 but this was not effective and this was stopped in 5/70 She was started on insulin in 2/16 with small doses of Lantus insulin Her A1c has been consistently over 8% since about 2013  Recent history:   INSULIN regimen is:   V-go 20 unit pump since 2/83/66, boluses 2 clicks with meals     Non-insulin hypoglycemic drugs the patient is taking are: metformin ER 500 mg bid   Her A1c in June was about the same at  7% and about the same least 3 times in a row  Current management, blood sugar patterns and problems identified:  She was having variable blood sugar control with postprandial spikes at times especially with snacks and blood sugars as high as 296 in June visit  She says she will tend to have carbohydrates in snacks like popcorn, peanuts butter crackers as also ice cream late in the evening  Since she was complaining of difficulty with losing weight she was started on [facility] in 6/19  She said that she has no side effects, has a little more frequent urination and recently a little vaginal itching for which she is starting to use Monistat  Although she has not had any labs since appears that her blood sugars are fluctuating less than before  She is still having occasional readings over 200 in the evenings  Fasting readings are somewhat better  She does not think she has hypo-glycemia although sometimes may feel a little lightheaded and then she will eat a snack  She has not done readings after meals and not clear if these are controlled, she has on  her own cut down her boluses to 4 units instead of 6 as before  Overall lowest blood sugars are in the mornings, see analysis below        Side effects from medications have been: nausea and diarrhea from 1000 MG metformin  Compliance with the medical regimen: fair Hypoglycemia:  level  Glucose monitoring:  done 3 times a day         Glucometer: One Touch Verio          Blood Glucose readings by download    Mean values apply above for all meters except median for One Touch  PRE-MEAL Fasting Lunch Dinner Bedtime Overall  Glucose range:  88-170    78-206   Mean/median: 108    143 133   POST-MEAL PC Breakfast PC Lunch PC Dinner  Glucose range:    124, 206  Mean/median:   135      Previous readings:  Mean values apply above for all meters except median for One Touch  PRE-MEAL Fasting Lunch Dinner Bedtime Overall  Glucose range:  93-188  75-266   112-296   Mean/median:  125    146  134+/-54      Self-care:   Meal times are:  Breakfast is at 10 AM, dinner 7,  lunch 2-3 pm  Typical meal intake: Breakfast is toast, oatmeal, sometimes bacon, milk   Lunch: Sometimes hot dogs, fish sandwich Evening meal is a protein like chicken, steak, pork chop, corn, greens.           Dietician visit, most recent: 2013 CDE visit: 12/2015               Exercise:  may go to the gym 1-2/7  Weight history:  Wt Readings from Last 3 Encounters:  11/02/17 157 lb 3.2 oz (71.3 kg)  10/11/17 158 lb (71.7 kg)  09/07/17 161 lb 9.6 oz (73.3 kg)    Glycemic control:   Lab Results  Component Value Date   HGBA1C 7.0 (A) 09/07/2017   HGBA1C 6.9 05/31/2017   HGBA1C 6.7 02/07/2017   Lab Results  Component Value Date   MICROALBUR 0.9 10/27/2016   LDLCALC 112 (H) 04/20/2017   CREATININE 0.69 10/27/2016   Lab Results  Component Value Date   MICRALBCREAT 0.8 10/27/2016    Other active problems: See review of systems   Allergies as of 11/02/2017   No Known Allergies     Medication List          Accurate as of 11/02/17 12:00 PM. Always use your most recent med list.          acetaminophen 500 MG tablet Commonly known as:  TYLENOL Take 1,000 mg by mouth 2 (two) times daily.   amLODipine 10 MG tablet Commonly known as:  NORVASC Take 1 tablet by mouth daily   canagliflozin 100 MG Tabs tablet Commonly known as:  INVOKANA 1 tablet before breakfast   cetirizine 10 MG tablet Commonly known as:  ZYRTEC Take 10 mg by mouth daily as needed.   GINKGO BILOBA COMPLEX PO Take by mouth.   GLUCOSAMINE HCL PO Take by mouth.   losartan 100 MG tablet Commonly known as:  COZAAR Take 1 tablet by mouth daily   meloxicam 15 MG tablet Commonly known as:  MOBIC TAKE 1 TABLET BY MOUTH ONCE DAILY AS NEEDED FOR PAIN (SEVERE PAIN)   metFORMIN 500 MG 24 hr tablet Commonly known as:  GLUCOPHAGE-XR Take 1 tablet by mouth twice a day with meals   NON FORMULARY CPAP @@ 9 cm H2O nightly   NOVOLIN R RELION 100 units/mL injection Generic drug:  insulin regular 75 UNITS PER THE V-GO PUMP   ONETOUCH VERIO test strip Generic drug:  glucose blood Use to test blood sugar 4 times daily   ONETOUCH VERIO w/Device Kit 1 each by Does not apply route 4 (four) times daily.   ReliOn Ultra Thin Lancets Misc Inject 1 Units as directed 3 (three) times daily as needed.   V-GO 20 Kit USE 1 PER DAY   Vitamin D 2000 units Caps Take 1 capsule (2,000 Units total) by mouth daily.       Allergies: No Known Allergies  Past Medical History:  Diagnosis Date  . Allergy   . Arthritis   . Cataract   . Diabetes mellitus without complication ([place])   . Hypertension   . OSA (obstructive sleep apnea) 08/23/2016  . Osteoporosis     Past Surgical History:  Procedure Laterality Date  . ABDOMINAL HYSTERECTOMY      Family History  Problem Relation Age of Onset  . Breast cancer Other 40  . Alcohol abuse Father   . Mental illness Father   . Stroke Father   . Diabetes Sister   . Asthma  Brother   . Diabetes Brother   . Diabetes Maternal Aunt   . Heart disease Maternal Aunt   . Diabetes Maternal Uncle   . Early death Maternal Uncle   . Breast cancer Cousin 66       mat cousin    Social History:  reports that she has never smoked. She has never used smokeless tobacco. She reports that she does not drink alcohol or use drugs.   Review of Systems  Lipid history: She is not on statin treatment, LDL consistently over 100 She says her family members told her not to take a statin although her father did take a statin drug, she is afraid of side effects Does not appear to understand the benefits of risk reduction with cardiovascular events   Lab Results  Component Value Date   CHOL 219 (H) 04/20/2017   HDL 95 04/20/2017   LDLCALC 112 (H) 04/20/2017   TRIG 59 04/20/2017   CHOLHDL 2.3 04/20/2017           Hypertension: on treatment for several years, on amlodipine and losartan, Blood pressure controlled  Has been followed by PCP  BP Readings from Last 3 Encounters:  11/02/17 136/68  10/11/17 132/86  09/07/17 130/70     Most recent foot exam: 6/19  No recent problems with numbness in her feet  LABS:  No visits with results within 1 Week(s) from this visit.  Latest known visit with results is:  Abstract on 10/10/2017  Component Date Value Ref Range Status  . HM Hepatitis Screen 04/16/2014 Negative-Validated   Final    Physical Examination:  BP 136/68 (BP Location: Left Arm, Patient Position: Sitting, Cuff Size: Normal)   Pulse 68   Ht 5' 2" (1.575 m)   Wt 157 lb 3.2 oz (71.3 kg)   SpO2 97%   BMI 28.75 kg/m       ASSESSMENT:  Diabetes type 2 on insulin  See history of present illness for detailed discussion of current diabetes management, blood sugar patterns and problems identified  She is being treated with an insulin pump/V-go, Invokana and metformin  A1c is reasonably good at 7 on her last visit although no recent labs available to  assess her level of control  She appears to be benefiting from starting [place] on her last visit about 2 months ago She is having less fluctuation and less postprandial hypoglycemia is also somewhat better fasting readings This is without any hypoglycemia with her V-go pump  She does have some sporadic high readings again based on her carbohydrate intake at meals or snacks especially with her tending to snack later in the evening She has started losing weight also which she was concerned about on the previous visit   LIPIDS: LDL 112 on the last measurement Discussed again the benefits of statin drugs and risk reduction and a high risk of cardiovascular disease with diabetes and other risk factors combined She is now open to using low-dose statin drugs  HYPERTENSION: Well controlled and also followed by PCP  PLAN:  She will try to check more readings about 2 hours after meals which she is not doing This will help her adjust her mealtime doses, currently mostly taking 4 units boluses We will check fructosamine to evaluate overall level of control She will stay on Invokana Discussed preventive hygiene measures to reduce chances of vaginal yeast infection She will need to cut back on portions of carbohydrates late at night Encouraged her to walk more regularly  for exercise  Pravastatin 20 mg, may start with 3 times a week and then start taking daily if tolerated She will call if she has any problems with this   There are no Patient Instructions on file for this visit.   Counseling time on subjects discussed in assessment and plan sections is over 50% of today's 25 minute visit   Elayne Snare 11/02/2017, 12:00 PM   Note: This office note was prepared with Dragon voice recognition system technology. Any transcriptional errors that result from this process are unintentional.

## 2017-11-02 NOTE — Patient Instructions (Addendum)
More sugars after meals and snacks  Check blood sugars on waking up  3/7  Also check blood sugars about 2 hours after a meal and do this after different meals by rotation  Recommended blood sugar levels on waking up is 90-130 and about 2 hours after meal is 130-160  Please bring your blood sugar monitor to each visit, thank you

## 2017-11-03 LAB — FRUCTOSAMINE: FRUCTOSAMINE: 281 umol/L (ref 0–285)

## 2017-11-07 ENCOUNTER — Telehealth: Payer: Self-pay | Admitting: Emergency Medicine

## 2017-11-07 ENCOUNTER — Other Ambulatory Visit: Payer: Self-pay

## 2017-11-07 DIAGNOSIS — E1165 Type 2 diabetes mellitus with hyperglycemia: Secondary | ICD-10-CM | POA: Diagnosis not present

## 2017-11-07 DIAGNOSIS — Z794 Long term (current) use of insulin: Secondary | ICD-10-CM | POA: Diagnosis not present

## 2017-11-07 DIAGNOSIS — M7711 Lateral epicondylitis, right elbow: Secondary | ICD-10-CM | POA: Diagnosis not present

## 2017-11-07 MED ORDER — CANAGLIFLOZIN 100 MG PO TABS: ORAL_TABLET | ORAL | 3 refills | Status: DC

## 2017-11-07 NOTE — Telephone Encounter (Signed)
I have called patient to let her know that paperwork was put in the mail.

## 2017-11-07 NOTE — Telephone Encounter (Signed)
Pt called stating she left paperwork to be filled out by the [place] after her appt on 11/02/17. Patient is wondering if those have been completed. I explained to the patient paperwork takes 5-7 days and that the CMA has been out of the office the past few days. Can you please give her a call when they have been mailed back to her. Thanks.

## 2017-11-12 ENCOUNTER — Ambulatory Visit: Payer: Self-pay

## 2017-11-12 ENCOUNTER — Telehealth: Payer: Self-pay

## 2017-11-12 NOTE — Telephone Encounter (Signed)
Pt NS'd for 11/12/17 AWV w/ [facility]. Called pt to resched appt. Unable to LVM requesting returned call d/t no answer.

## 2017-11-21 ENCOUNTER — Telehealth: Payer: Self-pay | Admitting: Internal Medicine

## 2017-11-21 NOTE — Telephone Encounter (Signed)
Called to schedule Medicare Annual Wellness Visit with the Nurse Health Advisor.   If patient returns call, please note: their last AWV was on 8/20 /18 please schedule AWV with NHA any date after 8/20 2019  Thank you! For any questions please contact: Jill Alexanders [phone] or Skype at: [email or web address]

## 2017-11-25 DIAGNOSIS — G4733 Obstructive sleep apnea (adult) (pediatric): Secondary | ICD-10-CM | POA: Diagnosis not present

## 2017-12-26 DIAGNOSIS — G4733 Obstructive sleep apnea (adult) (pediatric): Secondary | ICD-10-CM | POA: Diagnosis not present

## 2018-01-08 ENCOUNTER — Telehealth: Payer: Self-pay | Admitting: Endocrinology

## 2018-01-08 NOTE — Telephone Encounter (Signed)
error 

## 2018-01-28 DIAGNOSIS — M79674 Pain in right toe(s): Secondary | ICD-10-CM | POA: Diagnosis not present

## 2018-01-28 DIAGNOSIS — M79675 Pain in left toe(s): Secondary | ICD-10-CM | POA: Diagnosis not present

## 2018-01-28 DIAGNOSIS — B351 Tinea unguium: Secondary | ICD-10-CM | POA: Diagnosis not present

## 2018-02-08 ENCOUNTER — Ambulatory Visit: Payer: PPO | Admitting: Endocrinology

## 2018-02-12 NOTE — Progress Notes (Addendum)
Patient ID: Melinda Ford, female   DOB: [birth year], 72 y.o.   MRN: [id number]            Reason for Appointment: Follow-up for Type 2 Diabetes   History of Present Illness:          Date of diagnosis of type 2 diabetes mellitus: ?  2004        Background history:   She previously had been on metformin and also Avandia initially Detailed records of her care are not available prior to 2013 or so She was tried on Januvia in 2016 but this was not effective and this was stopped in 5/70 She was started on insulin in 2/16 with small doses of Lantus insulin Her A1c has been consistently over 8% since about 2013  Recent history:   INSULIN regimen is:   V-go 20 unit pump since 7/90/24, boluses 2 clicks with meals     Non-insulin hypoglycemic drugs the patient is taking are: metformin ER 500 mg bid   Her A1c in June was about the same at  7% and now is 6.7  Current management, blood sugar patterns and problems identified:  She says that she stopped taking her Invokana in September  Previously had thought that it was helping her keep her weight down but her weight appears to be the same  Also blood sugars from A1c are not higher although in the last month her blood sugars are averaging higher than  HYPERGLYCEMIA is occurring frequently late at night and again likely to be from not bolusing for snacks  She may have carbohydrate rich snacks such as chips in the evening  She is also very inconsistent with pump at the same time  Occasional high readings in the mornings may be related to her not changing a pump at bedtime for various reasons  Difficult to see enough blood sugar readings after meals but does not appear to have any consistent high readings after meals except occasionally after lunch  No hypoglycemia with lowest blood sugar 72 morning  She is is having difficulty with cost of medications in the donut hole including the pump        Side effects from medications have  been: nausea and diarrhea from 1000 MG metformin  Compliance with the medical regimen: fair Hypoglycemia:  level  Glucose monitoring:  done 3 times a day         Glucometer: One Touch Verio          Blood Glucose readings by download     PRE-MEAL Fasting Lunch Dinner Bedtime Overall  Glucose range:  72-213   127-220  128-267   Mean/median:  124    188  150   POST-MEAL PC Breakfast PC Lunch PC Dinner  Glucose range:    116-225  Mean/median:      Previous readings:  PRE-MEAL Fasting Lunch Dinner Bedtime Overall  Glucose range:  88-170    78-206   Mean/median: 108    143 133   POST-MEAL PC Breakfast PC Lunch PC Dinner  Glucose range:    124, 206  Mean/median:   135       Self-care:   Meal times are:  Breakfast is at 10 AM, dinner 7, lunch 2-3 pm  Typical meal intake: Breakfast is toast, oatmeal, sometimes bacon, milk   Lunch: Sometimes hot dogs, fish sandwich Evening meal is a protein like chicken, steak, pork chop, corn, greens.  Dietician visit, most recent: 2013 CDE visit: 12/2015               Exercise:  may go to the gym 1-2/7  Weight history:  Wt Readings from Last 3 Encounters:  02/13/18 157 lb (71.2 kg)  11/02/17 157 lb 3.2 oz (71.3 kg)  10/11/17 158 lb (71.7 kg)    Glycemic control:   Lab Results  Component Value Date   HGBA1C 6.7 (A) 02/13/2018   HGBA1C 7.0 (A) 09/07/2017   HGBA1C 6.9 05/31/2017   Lab Results  Component Value Date   MICROALBUR 1.1 11/02/2017   LDLCALC 110 (H) 02/13/2018   CREATININE 0.76 02/13/2018   Lab Results  Component Value Date   MICRALBCREAT 1.5 11/02/2017    Other active problems: See review of systems   Allergies as of 02/13/2018   No Known Allergies     Medication List        Accurate as of 02/13/18  4:46 PM. Always use your most recent med list.          acetaminophen 500 MG tablet Commonly known as:  TYLENOL Take 1,000 mg by mouth 2 (two) times daily.   amLODipine 10 MG tablet Commonly  known as:  NORVASC Take 1 tablet by mouth daily   canagliflozin 300 MG Tabs tablet Commonly known as:  INVOKANA Take 1 tablet (300 mg total) by mouth daily before breakfast.   cetirizine 10 MG tablet Commonly known as:  ZYRTEC Take 10 mg by mouth daily as needed.   FREESTYLE LIBRE 14 DAY READER Devi 1 Device by Does not apply route once for 1 dose.   FREESTYLE LIBRE 14 DAY SENSOR Misc 1 Units by Does not apply route every 14 (fourteen) days.   GINKGO BILOBA COMPLEX PO Take by mouth.   GLUCOSAMINE HCL PO Take by mouth.   losartan 100 MG tablet Commonly known as:  COZAAR Take 1 tablet by mouth daily   meloxicam 15 MG tablet Commonly known as:  MOBIC TAKE 1 TABLET BY MOUTH ONCE DAILY AS NEEDED FOR PAIN (SEVERE PAIN)   metFORMIN 500 MG 24 hr tablet Commonly known as:  GLUCOPHAGE-XR Take 1 tablet by mouth twice a day with meals   NON FORMULARY CPAP @@ 9 cm H2O nightly   NOVOLIN R RELION 100 units/mL injection Generic drug:  insulin regular 75 UNITS PER THE V-GO PUMP   ONETOUCH VERIO test strip Generic drug:  glucose blood Use to test blood sugar 4 times daily   ONETOUCH VERIO w/Device Kit 1 each by Does not apply route 4 (four) times daily.   pravastatin 20 MG tablet Commonly known as:  PRAVACHOL Take 1 tablet (20 mg total) by mouth daily.   ReliOn Ultra Thin Lancets Misc Inject 1 Units as directed 3 (three) times daily as needed.   V-GO 20 Kit USE 1 PER DAY   Vitamin D 50 MCG (2000 UT) Caps Take 1 capsule (2,000 Units total) by mouth daily.       Allergies: No Known Allergies  Past Medical History:  Diagnosis Date  . Allergy   . Arthritis   . Cataract   . Diabetes mellitus without complication ([place])   . Hypertension   . OSA (obstructive sleep apnea) 08/23/2016  . Osteoporosis     Past Surgical History:  Procedure Laterality Date  . ABDOMINAL HYSTERECTOMY      Family History  Problem Relation Age of Onset  . Breast cancer Other 40  .  Alcohol  abuse Father   . Mental illness Father   . Stroke Father   . Diabetes Sister   . Asthma Brother   . Diabetes Brother   . Diabetes Maternal Aunt   . Heart disease Maternal Aunt   . Diabetes Maternal Uncle   . Early death Maternal Uncle   . Breast cancer Cousin 34       mat cousin    Social History:  reports that she has never smoked. She has never used smokeless tobacco. She reports that she does not drink alcohol or use drugs.   Review of Systems  Lipid history: She is finally taking pravastatin as prescribed on her last visit, 20 mg without side effects   Lab Results  Component Value Date   CHOL 214 (H) 02/13/2018   HDL 93.60 02/13/2018   LDLCALC 110 (H) 02/13/2018   TRIG 53.0 02/13/2018   CHOLHDL 2 02/13/2018           Hypertension: on treatment for several years, on amlodipine and losartan, Blood pressure variably controlled Losartan was increased back to 100 mg by patient when she stopped her Invokana  Has been followed by PCP  BP Readings from Last 3 Encounters:  02/13/18 (!) 150/75  11/02/17 138/74  10/11/17 132/86     Most recent foot exam: 6/19  LABS:  Office Visit on 02/13/2018  Component Date Value Ref Range Status  . Hemoglobin A1C 02/13/2018 6.7* 4.0 - 5.6 % Final  . Cholesterol 02/13/2018 214* 0 - 200 mg/dL Final   ATP III Classification       Desirable:  < 200 mg/dL               Borderline High:  200 - 239 mg/dL          High:  > = 240 mg/dL  . Triglycerides 02/13/2018 53.0  0.0 - 149.0 mg/dL Final   Normal:  <150 mg/dLBorderline High:  150 - 199 mg/dL  . HDL 02/13/2018 93.60  >39.00 mg/dL Final  . VLDL 02/13/2018 10.6  0.0 - 40.0 mg/dL Final  . LDL Cholesterol 02/13/2018 110* 0 - 99 mg/dL Final  . Total CHOL/HDL Ratio 02/13/2018 2   Final                  Men          Women1/2 Average Risk     3.4          3.3Average Risk          5.0          4.42X Average Risk          9.6          7.13X Average Risk          15.0          11.0                       . NonHDL 02/13/2018 120.36   Final   NOTE:  Non-HDL goal should be 30 mg/dL higher than patient's LDL goal (i.e. LDL goal of < 70 mg/dL, would have non-HDL goal of < 100 mg/dL)  . Sodium 02/13/2018 140  135 - 145 mEq/L Final  . Potassium 02/13/2018 4.1  3.5 - 5.1 mEq/L Final  . Chloride 02/13/2018 105  96 - 112 mEq/L Final  . CO2 02/13/2018 32  19 - 32 mEq/L Final  . Glucose, Bld 02/13/2018 85  70 -  99 mg/dL Final  . BUN 02/13/2018 25* 6 - 23 mg/dL Final  . Creatinine, Ser 02/13/2018 0.76  0.40 - 1.20 mg/dL Final  . Total Bilirubin 02/13/2018 0.7  0.2 - 1.2 mg/dL Final  . Alkaline Phosphatase 02/13/2018 66  39 - 117 U/L Final  . AST 02/13/2018 25  0 - 37 U/L Final  . ALT 02/13/2018 29  0 - 35 U/L Final  . Total Protein 02/13/2018 7.6  6.0 - 8.3 g/dL Final  . Albumin 02/13/2018 4.4  3.5 - 5.2 g/dL Final  . Calcium 02/13/2018 10.7* 8.4 - 10.5 mg/dL Final  . GFR 02/13/2018 95.83  >60.00 mL/min Final    Physical Examination:  BP (!) 150/75   Pulse 86   Ht _0  (1.575 m)   Wt 157 lb (71.2 kg)   SpO2 98%   BMI 28.72 kg/m       ASSESSMENT:  Diabetes type 2 on insulin  See history of present illness for detailed discussion of current diabetes management, blood sugar patterns and problems identified  She is being treated with an insulin pump/V-go, Invokana and metformin  A1c is reasonably good at 6.7  Her main difficulties are with balancing diet and boluses from her insulin pump She is also not able to get a consistent program of changing her insulin pump at the same time every day She is still not at all understanding the mechanics of the V-go pump including the basal rate She can do better with diet also She probably has less postprandial hyperglycemia when she stays on Invokana which she cannot afford consistently   LIPIDS: LDL 112 before starting pravastatin and will check her labs again today   HYPERTENSION: Blood pressure can be better, also followed  by PCP Since she really agrees to try Invokana again this may help her blood pressure control  PLAN:   Given samples of the V-go pump She thinks she can try the Invokana again and for now can start with half tablet of the 300 mg Better compliance with changing the pump exactly at the same time 24 hours apart which she can do at bedtime She will need to bolus for all snacks in the evening especially high fat or high carbohydrate with 1-2 clicks Consistent diet Exercise needs to be started Blood sugar targets at various times discussed and she need to check readings more often before 10 PM to help her adjust her suppertime bolus  Her labs to be checked today   Patient Instructions  Must change pump at bedtime daily every 24 hrs  Must check sugars 2 hrs after dinner  Click 2-9J for snacks at nite  Take 1 tab of Losartan  See PCP for BP  Check blood sugars on waking up 4 days a week  Also check blood sugars about 2 hours after meals and do this after different meals by rotation  Recommended blood sugar levels on waking up are 90-130 and about 2 hours after meal is 130-180  Please bring your blood sugar monitor to each visit, thank you      Counseling time on subjects discussed in assessment and plan sections is over 50% of today's 25 minute visit   Elayne Snare 02/13/2018, 4:46 PM   Note: This office note was prepared with Dragon voice recognition system technology. Any transcriptional errors that result from this process are unintentional.

## 2018-02-13 ENCOUNTER — Encounter: Payer: Self-pay | Admitting: Endocrinology

## 2018-02-13 ENCOUNTER — Ambulatory Visit: Payer: PPO | Admitting: Endocrinology

## 2018-02-13 VITALS — BP 150/75 | HR 86 | Ht 62.0 in | Wt 157.0 lb

## 2018-02-13 DIAGNOSIS — E7800 Pure hypercholesterolemia, unspecified: Secondary | ICD-10-CM | POA: Diagnosis not present

## 2018-02-13 DIAGNOSIS — Z794 Long term (current) use of insulin: Secondary | ICD-10-CM | POA: Diagnosis not present

## 2018-02-13 DIAGNOSIS — E1165 Type 2 diabetes mellitus with hyperglycemia: Secondary | ICD-10-CM

## 2018-02-13 DIAGNOSIS — I10 Essential (primary) hypertension: Secondary | ICD-10-CM | POA: Diagnosis not present

## 2018-02-13 LAB — COMPREHENSIVE METABOLIC PANEL
ALBUMIN: 4.4 g/dL (ref 3.5–5.2)
ALT: 29 U/L (ref 0–35)
AST: 25 U/L (ref 0–37)
Alkaline Phosphatase: 66 U/L (ref 39–117)
BILIRUBIN TOTAL: 0.7 mg/dL (ref 0.2–1.2)
BUN: 25 mg/dL — ABNORMAL HIGH (ref 6–23)
CALCIUM: 10.7 mg/dL — AB (ref 8.4–10.5)
CHLORIDE: 105 meq/L (ref 96–112)
CO2: 32 meq/L (ref 19–32)
CREATININE: 0.76 mg/dL (ref 0.40–1.20)
GFR: 95.83 mL/min (ref >60.00)
Glucose, Bld: 85 mg/dL (ref 70–99)
Potassium: 4.1 mEq/L (ref 3.5–5.1)
Sodium: 140 mEq/L (ref 135–145)
Total Protein: 7.6 g/dL (ref 6.0–8.3)

## 2018-02-13 LAB — POCT GLYCOSYLATED HEMOGLOBIN (HGB A1C): Hemoglobin A1C: 6.7 % — AB (ref 4.0–5.6)

## 2018-02-13 LAB — LIPID PANEL
CHOL/HDL RATIO: 2
Cholesterol: 214 mg/dL — ABNORMAL HIGH (ref 0–200)
HDL: 93.6 mg/dL (ref >39.00)
LDL CALC: 110 mg/dL — AB (ref 0–99)
NONHDL: 120.36
Triglycerides: 53 mg/dL (ref 0.0–149.0)
VLDL: 10.6 mg/dL (ref 0.0–40.0)

## 2018-02-13 MED ORDER — FREESTYLE LIBRE 14 DAY SENSOR MISC: 1.0000 [IU] | 4 refills | Status: DC

## 2018-02-13 MED ORDER — CANAGLIFLOZIN 300 MG PO TABS: 300.0000 mg | ORAL_TABLET | Freq: Every day | ORAL | 3 refills | Status: DC

## 2018-02-13 MED ORDER — FREESTYLE LIBRE 14 DAY READER DEVI: 1.0000 | Freq: Once | 0 refills | Status: AC

## 2018-02-13 NOTE — Patient Instructions (Addendum)
Must change pump at bedtime daily every 24 hrs  Must check sugars 2 hrs after dinner  Click 3-3L for snacks at nite  Take 1 tab of Losartan  See PCP for BP  Check blood sugars on waking up 4 days a week  Also check blood sugars about 2 hours after meals and do this after different meals by rotation  Recommended blood sugar levels on waking up are 90-130 and about 2 hours after meal is 130-180  Please bring your blood sugar monitor to each visit, thank you

## 2018-02-22 ENCOUNTER — Ambulatory Visit (INDEPENDENT_AMBULATORY_CARE_PROVIDER_SITE_OTHER): Payer: PPO | Admitting: Internal Medicine

## 2018-02-22 ENCOUNTER — Other Ambulatory Visit: Payer: Self-pay | Admitting: Internal Medicine

## 2018-02-22 ENCOUNTER — Encounter: Payer: Self-pay | Admitting: Internal Medicine

## 2018-02-22 ENCOUNTER — Ambulatory Visit
Admission: RE | Admit: 2018-02-22 | Discharge: 2018-02-22 | Disposition: A | Discharge status: Home / Self Care | Payer: PPO | Source: Ambulatory Visit | Attending: Internal Medicine | Admitting: Internal Medicine

## 2018-02-22 VITALS — BP 124/78 | HR 68 | Ht 62.0 in | Wt 159.0 lb

## 2018-02-22 DIAGNOSIS — G8929 Other chronic pain: Secondary | ICD-10-CM | POA: Diagnosis not present

## 2018-02-22 DIAGNOSIS — M546 Pain in thoracic spine: Secondary | ICD-10-CM | POA: Diagnosis not present

## 2018-02-22 DIAGNOSIS — M19071 Primary osteoarthritis, right ankle and foot: Secondary | ICD-10-CM | POA: Insufficient documentation

## 2018-02-22 DIAGNOSIS — M5124 Other intervertebral disc displacement, thoracic region: Secondary | ICD-10-CM | POA: Diagnosis not present

## 2018-02-22 DIAGNOSIS — M47814 Spondylosis without myelopathy or radiculopathy, thoracic region: Secondary | ICD-10-CM | POA: Diagnosis not present

## 2018-02-22 NOTE — Progress Notes (Signed)
Date:  02/22/2018   Name:  Melinda Ford   DOB:  10/05/1945   MRN:  [id number]   Chief Complaint: Back Pain (Pain in mid back. Feels sore. Irritating. Started a year ago. Not going away so wants to get checked out. )  Back Pain  This is a chronic problem. The current episode started more than 1 year ago. The problem occurs daily. The problem is unchanged. The pain is present in the thoracic spine. The quality of the pain is described as aching. The pain is mild. Pertinent negatives include no chest pain, fever, headaches or numbness.   Ankle swelling - she has a puffy place on her lateral right ankle.  She had a traumatic fracture and surgery 10 years ago.  She has no pain but is concerned about the swelling.  Review of Systems  Constitutional: Negative for chills, fatigue and fever.  Respiratory: Negative for cough, chest tightness, shortness of breath and wheezing.   Cardiovascular: Positive for leg swelling. Negative for chest pain.  Musculoskeletal: Positive for arthralgias, back pain and joint swelling.  Neurological: Negative for dizziness, light-headedness, numbness and headaches.  Psychiatric/Behavioral: Negative for sleep disturbance.    Patient Active Problem List   Diagnosis Date Noted  . Right lateral epicondylitis 06/04/2017  . Primary localized osteoarthrosis of ankle and foot 09/20/2016  . Tinea versicolor 09/20/2016  . OSA (obstructive sleep apnea) 08/23/2016  . Allergic rhinitis 07/13/2016  . Radiculopathy of lumbosacral region 06/22/2016  . Right sided sciatica 03/09/2016  . Overweight (BMI 25.0-29.9) 10/01/2015  . Vitamin D deficiency 09/18/2014  . Onychomycosis 09/16/2014  . Degenerative disc disease, lumbar 09/16/2014  . Benign essential HTN 10/10/2010  . Osteoporosis 10/10/2010  . Type 2 diabetes, controlled, with neuropathy ([place]) 10/10/2010    No Known Allergies  Past Surgical History:  Procedure Laterality Date  . ABDOMINAL HYSTERECTOMY       Social History   Tobacco Use  . Smoking status: Never Smoker  . Smokeless tobacco: Never Used  Substance Use Topics  . Alcohol use: No    Alcohol/week: 1.0 standard drinks    Types: 1 Standard drinks or equivalent per week  . Drug use: No     Medication list has been reviewed and updated.  Current Meds  Medication Sig  . acetaminophen (TYLENOL) 500 MG tablet Take 1,000 mg by mouth 2 (two) times daily.  Marland Kitchen amLODipine (NORVASC) 10 MG tablet Take 1 tablet by mouth daily  . Blood Glucose Monitoring Suppl (ONETOUCH VERIO) w/Device KIT 1 each by Does not apply route 4 (four) times daily.  . cetirizine (ZYRTEC) 10 MG tablet Take 10 mg by mouth daily as needed.  . Cholecalciferol (VITAMIN D) 2000 UNITS CAPS Take 1 capsule (2,000 Units total) by mouth daily.  . Continuous Blood Gluc Receiver (FREESTYLE LIBRE 14 DAY READER) DEVI See admin instructions.  . Continuous Blood Gluc Sensor (FREESTYLE LIBRE 14 DAY SENSOR) MISC 1 Units by Does not apply route every 14 (fourteen) days.  Marland Kitchen GINKGO BILOBA COMPLEX PO Take by mouth.  Marland Kitchen GLUCOSAMINE HCL PO Take by mouth.  . Insulin Disposable Pump (V-GO 20) KIT USE 1 PER DAY  . losartan (COZAAR) 100 MG tablet Take 1 tablet by mouth daily (Patient taking differently: Take 1 half tablet by mouth daily)  . meloxicam (MOBIC) 15 MG tablet TAKE 1 TABLET BY MOUTH ONCE DAILY AS NEEDED FOR PAIN (SEVERE PAIN)  . metFORMIN (GLUCOPHAGE-XR) 500 MG 24 hr tablet Take 1 tablet by  mouth twice a day with meals  . NON FORMULARY CPAP @@ 9 cm H2O nightly  . NOVOLIN R RELION 100 UNIT/ML injection 75 UNITS PER THE V-GO PUMP  . ONETOUCH VERIO test strip Use to test blood sugar 4 times daily  . pravastatin (PRAVACHOL) 20 MG tablet Take 1 tablet (20 mg total) by mouth daily.  . ReliOn Ultra Thin Lancets MISC Inject 1 Units as directed 3 (three) times daily as needed.    PHQ 2/9 Scores 03/21/2017 11/20/2016 02/04/2016 10/01/2015  PHQ - 2 Score 0 0 1 0  PHQ- 9 Score 0 - - -     Physical Exam  Constitutional: She is oriented to person, place, and time. She appears well-developed. No distress.  HENT:  Head: Normocephalic and atraumatic.  Cardiovascular: Normal rate, regular rhythm and normal heart sounds.  Pulmonary/Chest: Effort normal. No respiratory distress.  Musculoskeletal: Normal range of motion.       Arms:      Feet:  Neurological: She is alert and oriented to person, place, and time.  Skin: Skin is warm and dry. No rash noted.  Psychiatric: She has a normal mood and affect. Her behavior is normal. Thought content normal.  Nursing note and vitals reviewed.   BP 124/78 (BP Location: Right Arm, Patient Position: Sitting, Cuff Size: Normal)   Pulse 68   Ht _0  (1.575 m)   Wt 159 lb (72.1 kg)   SpO2 97%   BMI 29.08 kg/m   Assessment and Plan: 1. Chronic midline thoracic back pain Suspect mild OA given history of lumbar back pain Continue Tylenol - DG Thoracic Spine 2 View; Future  2. 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 Any errors are unintentional.  Halina Maidens, MD [facility]  02/22/2018

## 2018-03-06 ENCOUNTER — Other Ambulatory Visit: Payer: Self-pay

## 2018-03-06 ENCOUNTER — Telehealth: Payer: Self-pay

## 2018-03-06 MED ORDER — MELOXICAM 15 MG PO TABS: ORAL_TABLET | ORAL | 1 refills | Status: DC

## 2018-03-06 NOTE — Telephone Encounter (Signed)
Called requesting imaging results. Advised and she asked that we refill Meloxicam since this is arthritis in her back. No need to call her back if you send refills she will keep in touch with pharmacy [facility] [place]

## 2018-03-06 NOTE — Telephone Encounter (Signed)
Refills sent in. Thank you.

## 2018-03-11 ENCOUNTER — Telehealth: Payer: Self-pay | Admitting: Endocrinology

## 2018-03-11 NOTE — Telephone Encounter (Signed)
Pt would like to know if she could break her Invokana into thirds and only take 75mg  daily instead of 300mg . Pt stated that she feels the 300mg  is too much for her. Pt stated that she is frequently getting dizzy and when she checks her sugar, it would be in the 70's

## 2018-03-11 NOTE — Telephone Encounter (Signed)
Patient would like to know if she could divide her medication into thirds.  She stated that she is taking half now.   Please advise   canagliflozin (INVOKANA) 300 MG TABS tablet

## 2018-03-11 NOTE — Telephone Encounter (Signed)
Called pt and left detailed voicemail with MD message. 

## 2018-03-11 NOTE — Telephone Encounter (Signed)
What time of the day is her blood sugars in the 70s? Is she able to check her blood pressure as this may be making her dizzy.  If unable to check at home she needs to get blood pressure medication adjusted by PCP or come here

## 2018-03-25 ENCOUNTER — Telehealth: Payer: Self-pay

## 2018-03-25 NOTE — Telephone Encounter (Signed)
Attempted to reach patient to schedule AWV for Monday 04/22/18 at 8:00 prior to her appt with Dr. Army Melia. Unable to leave msg due to voicemail full. Please notify me if pt calls back to schedule. Thank you!

## 2018-03-29 ENCOUNTER — Ambulatory Visit (INDEPENDENT_AMBULATORY_CARE_PROVIDER_SITE_OTHER): Payer: PPO | Admitting: Internal Medicine

## 2018-03-29 ENCOUNTER — Encounter: Payer: Self-pay | Admitting: Internal Medicine

## 2018-03-29 VITALS — BP 122/66 | HR 68 | Temp 98.2°F | Ht 62.0 in | Wt 159.0 lb

## 2018-03-29 DIAGNOSIS — B9789 Other viral agents as the cause of diseases classified elsewhere: Secondary | ICD-10-CM

## 2018-03-29 DIAGNOSIS — J069 Acute upper respiratory infection, unspecified: Secondary | ICD-10-CM

## 2018-03-29 MED ORDER — GUAIFENESIN-CODEINE 100-10 MG/5ML PO SYRP: 5.0000 mL | ORAL_SOLUTION | Freq: Every day | ORAL | 0 refills | Status: DC

## 2018-03-29 NOTE — Progress Notes (Signed)
Date:  03/29/2018   Name:  Melinda Ford   DOB:  1945-10-27   MRN:  [id number]   Chief Complaint: Cough (Patient cough with congestion. Feels like more in chest. X 3 days. Fever with joints aching. Stated she has been taking an old rx for "cough syrup" and ran out. Michela Pitcher it was helping her and wanted to get new Rx for this. )  Cough  This is a new problem. The current episode started in the past 7 days. The problem has been unchanged. The cough is non-productive. Pertinent negatives include no chest pain, chills, ear pain, fever, headaches, postnasal drip, shortness of breath or wheezing. The symptoms are aggravated by exercise. She has tried prescription cough suppressant for the symptoms. The treatment provided moderate relief. There is no history of asthma, COPD or pneumonia.    Review of Systems  Constitutional: Negative for chills and fever.  HENT: Negative for ear pain and postnasal drip.   Respiratory: Positive for cough. Negative for chest tightness, shortness of breath and wheezing.   Cardiovascular: Negative for chest pain, palpitations and leg swelling.  Musculoskeletal: Positive for arthralgias.  Neurological: Positive for light-headedness. Negative for dizziness, syncope and headaches.  Psychiatric/Behavioral: Negative for dysphoric mood and sleep disturbance.    Patient Active Problem List   Diagnosis Date Noted  . 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 Chronic midline thoracic back pain 02/22/2018  . Right lateral epicondylitis 06/04/2017  . Tinea versicolor 09/20/2016  . OSA (obstructive sleep apnea) 08/23/2016  . Allergic rhinitis 07/13/2016  . Radiculopathy of lumbosacral region 06/22/2016  . Right sided sciatica 03/09/2016  . Overweight (BMI 25.0-29.9) 10/01/2015  . Vitamin D deficiency 09/18/2014  . Onychomycosis 09/16/2014  . Degenerative disc disease, lumbar 09/16/2014  . Benign essential HTN 10/10/2010  . Osteoporosis  10/10/2010  . Type 2 diabetes, controlled, with neuropathy ([place]) 10/10/2010    No Known Allergies  Past Surgical History:  Procedure Laterality Date  . ABDOMINAL HYSTERECTOMY      Social History   Tobacco Use  . Smoking status: Never Smoker  . Smokeless tobacco: Never Used  Substance Use Topics  . Alcohol use: No    Alcohol/week: 1.0 standard drinks    Types: 1 Standard drinks or equivalent per week  . Drug use: No     Medication list has been reviewed and updated.  Current Meds  Medication Sig  . acetaminophen (TYLENOL) 500 MG tablet Take 1,000 mg by mouth 2 (two) times daily.  Marland Kitchen amLODipine (NORVASC) 10 MG tablet Take 1 tablet by mouth daily  . Blood Glucose Monitoring Suppl (ONETOUCH VERIO) w/Device KIT 1 each by Does not apply route 4 (four) times daily.  . canagliflozin (INVOKANA) 300 MG TABS tablet Take 1 tablet (300 mg total) by mouth daily before breakfast.  . cetirizine (ZYRTEC) 10 MG tablet Take 10 mg by mouth daily as needed.  . Cholecalciferol (VITAMIN D) 2000 UNITS CAPS Take 1 capsule (2,000 Units total) by mouth daily.  . Continuous Blood Gluc Receiver (FREESTYLE LIBRE 14 DAY READER) DEVI See admin instructions.  . Continuous Blood Gluc Sensor (FREESTYLE LIBRE 14 DAY SENSOR) MISC 1 Units by Does not apply route every 14 (fourteen) days.  Marland Kitchen GINKGO BILOBA COMPLEX PO Take by mouth.  Marland Kitchen GLUCOSAMINE HCL PO Take by mouth.  . Insulin Disposable Pump (V-GO 20) KIT USE 1 PER DAY  . losartan (COZAAR) 100 MG tablet Take 1 tablet by mouth daily (Patient  taking differently: Take 1 half tablet by mouth daily)  . meloxicam (MOBIC) 15 MG tablet TAKE 1 TABLET BY MOUTH ONCE DAILY AS NEEDED FOR PAIN (SEVERE PAIN)  . metFORMIN (GLUCOPHAGE-XR) 500 MG 24 hr tablet Take 1 tablet by mouth twice a day with meals  . NON FORMULARY CPAP @@ 9 cm H2O nightly  . NOVOLIN R RELION 100 UNIT/ML injection 75 UNITS PER THE V-GO PUMP  . ONETOUCH VERIO test strip Use to test blood sugar 4 times  daily  . pravastatin (PRAVACHOL) 20 MG tablet Take 1 tablet (20 mg total) by mouth daily.  . ReliOn Ultra Thin Lancets MISC Inject 1 Units as directed 3 (three) times daily as needed.    PHQ 2/9 Scores 03/21/2017 11/20/2016 02/04/2016 10/01/2015  PHQ - 2 Score 0 0 1 0  PHQ- 9 Score 0 - - -    Physical Exam Vitals signs and nursing note reviewed.  Constitutional:      General: She is not in acute distress.    Appearance: She is well-developed.  HENT:     Head: Normocephalic and atraumatic.     Mouth/Throat:     Mouth: Mucous membranes are moist.  Neck:     Musculoskeletal: Normal range of motion and neck supple.  Cardiovascular:     Rate and Rhythm: Normal rate and regular rhythm.  Pulmonary:     Effort: Pulmonary effort is normal. No respiratory distress.     Breath sounds: Normal breath sounds. No wheezing or rhonchi.  Chest:     Chest wall: No tenderness.  Musculoskeletal: Normal range of motion.     Right lower leg: No edema.     Left lower leg: No edema.     Comments: OA changes of digits of both hands  Skin:    General: Skin is warm and dry.     Findings: No rash.  Neurological:     Mental Status: She is alert and oriented to person, place, and time.  Psychiatric:        Behavior: Behavior normal.        Thought Content: Thought content normal.     BP 122/66 (BP Location: Right Arm, Patient Position: Sitting, Cuff Size: Normal)   Pulse 68   Temp 98.2 F (36.8 C) (Oral)   Ht 5' 2" (1.575 m)   Wt 159 lb (72.1 kg)   SpO2 95%   BMI 29.08 kg/m   Assessment and Plan: 1. Viral URI with cough Continue fluids, follow up if sx worsen for consideration of antibiotics - guaiFENesin-codeine (ROBITUSSIN AC) 100-10 MG/5ML syrup; Take 5 mLs by mouth at bedtime.  Dispense: 118 mL; Refill: 0   Partially dictated using Editor, commissioning. Any errors are unintentional.  Halina Maidens, MD [facility]  03/29/2018

## 2018-04-03 HISTORY — PX: CATARACT EXTRACTION: SUR2

## 2018-04-22 ENCOUNTER — Encounter: Payer: Self-pay | Admitting: Internal Medicine

## 2018-04-26 ENCOUNTER — Telehealth: Payer: Self-pay | Admitting: Endocrinology

## 2018-04-26 ENCOUNTER — Other Ambulatory Visit: Payer: Self-pay

## 2018-04-26 MED ORDER — V-GO 20 KIT: PACK | 2 refills | Status: DC

## 2018-04-26 NOTE — Telephone Encounter (Signed)
Rx sent 

## 2018-04-26 NOTE — Telephone Encounter (Signed)
MEDICATION:Insulin Disposable Pump (V-GO 20) KIT   PHARMACY:  [place], [place]  IS THIS A 90 DAY SUPPLY :   IS PATIENT OUT OF MEDICATION:   IF NOT; HOW MUCH IS LEFT: 1  LAST APPOINTMENT DATE: _0 /12/2017  NEXT APPOINTMENT DATE:_1 /14/2020  DO WE HAVE YOUR PERMISSION TO LEAVE A DETAILED MESSAGE:  OTHER COMMENTS:    **Let patient know to contact pharmacy at the end of the day to make sure medication is ready. **  ** Please notify patient to allow 48-72 hours to process**  **Encourage patient to contact the pharmacy for refills or they can request refills through [facility]**

## 2018-05-03 ENCOUNTER — Telehealth: Payer: Self-pay | Admitting: Endocrinology

## 2018-05-03 NOTE — Telephone Encounter (Signed)
Patient called [facility] 1/30 @ 7:14PM   "Caller states is trying to get a prescription refill of Pravastatin in which she is completley out"

## 2018-05-06 ENCOUNTER — Other Ambulatory Visit: Payer: Self-pay

## 2018-05-06 ENCOUNTER — Other Ambulatory Visit: Payer: Self-pay | Admitting: Internal Medicine

## 2018-05-06 ENCOUNTER — Telehealth: Payer: Self-pay | Admitting: Endocrinology

## 2018-05-06 DIAGNOSIS — Z1231 Encounter for screening mammogram for malignant neoplasm of breast: Secondary | ICD-10-CM

## 2018-05-06 MED ORDER — PRAVASTATIN SODIUM 20 MG PO TABS: 20.0000 mg | ORAL_TABLET | Freq: Every day | ORAL | 3 refills | Status: DC

## 2018-05-06 NOTE — Telephone Encounter (Signed)
error 

## 2018-05-06 NOTE — Telephone Encounter (Signed)
Rx sent 

## 2018-05-13 ENCOUNTER — Telehealth: Payer: Self-pay | Admitting: Internal Medicine

## 2018-05-13 NOTE — Telephone Encounter (Signed)
Called to schedule Medicare Annual Wellness Visit with the Nurse Health Advisor. Patient was not available.  Left message with husband Gwyndolyn Saxon to have Ms. Coppess return my call.  If patient returns call, please note: their last AWV was on 11/20/16, please schedule AWV with [facility] any date AFTER 11/20/2017.  Thank you! For any questions please contact: Janace Hoard at [phone] or Skype [email or web address]

## 2018-05-15 ENCOUNTER — Ambulatory Visit (INDEPENDENT_AMBULATORY_CARE_PROVIDER_SITE_OTHER): Payer: PPO

## 2018-05-15 VITALS — BP 130/74 | HR 61 | Temp 98.1°F | Resp 16 | Ht 62.0 in | Wt 153.2 lb

## 2018-05-15 DIAGNOSIS — Z0000 Encounter for general adult medical examination without abnormal findings: Secondary | ICD-10-CM | POA: Diagnosis not present

## 2018-05-15 NOTE — Patient Instructions (Signed)
Melinda Ford , Thank you for taking time to come for your Medicare Wellness Visit. I appreciate your ongoing commitment to your health goals. Please review the following plan we discussed and let me know if I can assist you in the future.   Screening recommendations/referrals: Colonoscopy: Completed 2015. Repeat in 2025. Mammogram: done 05/02/17. Scheduled for 05/21/18. Bone Density: done 08/01/17. Repeat in 2021. Recommended yearly ophthalmology/optometry visit for glaucoma screening and checkup Recommended yearly dental visit for hygiene and checkup  Vaccinations: Influenza vaccine: done 01/18/18 Pneumococcal vaccine: PCV 13 done 04/16/14 PPSV23 done 05/06/10 Tdap vaccine: done 01/22/17 Shingles vaccine: 1st dose Shingrix done 01/18/18    Advanced directives: Please bring a copy of your health care power of attorney and living will to the office at your convenience.  Conditions/risks identified: Keep up the great work!  Next appointment: Please follow up in one year for your Medicare Annual Wellness visit.     Preventive Care 78 Years and Older, Female Preventive care refers to lifestyle choices and visits with your health care provider that can promote health and wellness. What does preventive care include?  A yearly physical exam. This is also called an annual well check.  Dental exams once or twice a year.  Routine eye exams. Ask your health care provider how often you should have your eyes checked.  Personal lifestyle choices, including:  Daily care of your teeth and gums.  Regular physical activity.  Eating a healthy diet.  Avoiding tobacco and drug use.  Limiting alcohol use.  Practicing safe sex.  Taking low-dose aspirin every day.  Taking vitamin and mineral supplements as recommended by your health care provider. What happens during an annual well check? The services and screenings done by your health care provider during your annual well check will depend on  your age, overall health, lifestyle risk factors, and family history of disease. Counseling  Your health care provider may ask you questions about your:  Alcohol use.  Tobacco use.  Drug use.  Emotional well-being.  Home and relationship well-being.  Sexual activity.  Eating habits.  History of falls.  Memory and ability to understand (cognition).  Work and work Statistician.  Reproductive health. Screening  You may have the following tests or measurements:  Height, weight, and BMI.  Blood pressure.  Lipid and cholesterol levels. These may be checked every 5 years, or more frequently if you are over 45 years old.  Skin check.  Lung cancer screening. You may have this screening every year starting at age 60 if you have a 30-pack-year history of smoking and currently smoke or have quit within the past 15 years.  Fecal occult blood test (FOBT) of the stool. You may have this test every year starting at age 43.  Flexible sigmoidoscopy or colonoscopy. You may have a sigmoidoscopy every 5 years or a colonoscopy every 10 years starting at age 22.  Hepatitis C blood test.  Hepatitis B blood test.  Sexually transmitted disease (STD) testing.  Diabetes screening. This is done by checking your blood sugar (glucose) after you have not eaten for a while (fasting). You may have this done every 1-3 years.  Bone density scan. This is done to screen for osteoporosis. You may have this done starting at age 44.  Mammogram. This may be done every 1-2 years. Talk to your health care provider about how often you should have regular mammograms. Talk with your health care provider about your test results, treatment options, and if necessary,  the need for more tests. Vaccines  Your health care provider may recommend certain vaccines, such as:  Influenza vaccine. This is recommended every year.  Tetanus, diphtheria, and acellular pertussis (Tdap, Td) vaccine. You may need a Td booster  every 10 years.  Zoster vaccine. You may need this after age 46.  Pneumococcal 13-valent conjugate (PCV13) vaccine. One dose is recommended after age 33.  Pneumococcal polysaccharide (PPSV23) vaccine. One dose is recommended after age 23. Talk to your health care provider about which screenings and vaccines you need and how often you need them. This information is not intended to replace advice given to you by your health care provider. Make sure you discuss any questions you have with your health care provider. Document Released: 04/16/2015 Document Revised: 12/08/2015 Document Reviewed: 01/19/2015 Elsevier Interactive Patient Education  2017 [place] Prevention in the Home Falls can cause injuries. They can happen to people of all ages. There are many things you can do to make your home safe and to help prevent falls. What can I do on the outside of my home?  Regularly fix the edges of walkways and driveways and fix any cracks.  Remove anything that might make you trip as you walk through a door, such as a raised step or threshold.  Trim any bushes or trees on the path to your home.  Use bright outdoor lighting.  Clear any walking paths of anything that might make someone trip, such as rocks or tools.  Regularly check to see if handrails are loose or broken. Make sure that both sides of any steps have handrails.  Any raised decks and porches should have guardrails on the edges.  Have any leaves, snow, or ice cleared regularly.  Use sand or salt on walking paths during winter.  Clean up any spills in your garage right away. This includes oil or grease spills. What can I do in the bathroom?  Use night lights.  Install grab bars by the toilet and in the tub and shower. Do not use towel bars as grab bars.  Use non-skid mats or decals in the tub or shower.  If you need to sit down in the shower, use a plastic, non-slip stool.  Keep the floor dry. Clean up any  water that spills on the floor as soon as it happens.  Remove soap buildup in the tub or shower regularly.  Attach bath mats securely with double-sided non-slip rug tape.  Do not have throw rugs and other things on the floor that can make you trip. What can I do in the bedroom?  Use night lights.  Make sure that you have a light by your bed that is easy to reach.  Do not use any sheets or blankets that are too big for your bed. They should not hang down onto the floor.  Have a firm chair that has side arms. You can use this for support while you get dressed.  Do not have throw rugs and other things on the floor that can make you trip. What can I do in the kitchen?  Clean up any spills right away.  Avoid walking on wet floors.  Keep items that you use a lot in easy-to-reach places.  If you need to reach something above you, use a strong step stool that has a grab bar.  Keep electrical cords out of the way.  Do not use floor polish or wax that makes floors slippery. If you must use  wax, use non-skid floor wax.  Do not have throw rugs and other things on the floor that can make you trip. What can I do with my stairs?  Do not leave any items on the stairs.  Make sure that there are handrails on both sides of the stairs and use them. Fix handrails that are broken or loose. Make sure that handrails are as long as the stairways.  Check any carpeting to make sure that it is firmly attached to the stairs. Fix any carpet that is loose or worn.  Avoid having throw rugs at the top or bottom of the stairs. If you do have throw rugs, attach them to the floor with carpet tape.  Make sure that you have a light switch at the top of the stairs and the bottom of the stairs. If you do not have them, ask someone to add them for you. What else can I do to help prevent falls?  Wear shoes that:  Do not have high heels.  Have rubber bottoms.  Are comfortable and fit you well.  Are closed  at the toe. Do not wear sandals.  If you use a stepladder:  Make sure that it is fully opened. Do not climb a closed stepladder.  Make sure that both sides of the stepladder are locked into place.  Ask someone to hold it for you, if possible.  Clearly mark and make sure that you can see:  Any grab bars or handrails.  First and last steps.  Where the edge of each step is.  Use tools that help you move around (mobility aids) if they are needed. These include:  Canes.  Walkers.  Scooters.  Crutches.  Turn on the lights when you go into a dark area. Replace any light bulbs as soon as they burn out.  Set up your furniture so you have a clear path. Avoid moving your furniture around.  If any of your floors are uneven, fix them.  If there are any pets around you, be aware of where they are.  Review your medicines with your doctor. Some medicines can make you feel dizzy. This can increase your chance of falling. Ask your doctor what other things that you can do to help prevent falls. This information is not intended to replace advice given to you by your health care provider. Make sure you discuss any questions you have with your health care provider. Document Released: 01/14/2009 Document Revised: 08/26/2015 Document Reviewed: 04/24/2014 Elsevier Interactive Patient Education  2017 [employer].

## 2018-05-15 NOTE — Progress Notes (Signed)
Subjective:   Melinda Ford is a 73 y.o. female who presents for Medicare Annual (Subsequent) preventive examination.  Review of Systems:   Cardiac Risk Factors include: advanced age (>20mn, >>83women);diabetes mellitus;dyslipidemia;hypertension     Objective:     Vitals: BP 130/74 (BP Location: Left Arm, Patient Position: Sitting, Cuff Size: Normal)   Pulse 61   Temp 98.1 F (36.7 C) (Oral)   Resp 16   Ht 5' 2"  (1.575 m)   Wt 153 lb 3.2 oz (69.5 kg)   SpO2 98%   BMI 28.02 kg/m   Body mass index is 28.02 kg/m.  Advanced Directives 05/15/2018 11/20/2016 09/20/2016 02/29/2016 02/04/2016 12/01/2015 11/30/2015  Does Patient Have a Medical Advance Directive? Yes Yes Yes No Yes No No  Type of AParamedicof AGreensboro BendLiving will [place]ear DanceLiving will [place] will - - - -  Copy of [place] Chart? No - copy requested No - copy requested - - - - -  Would patient like information on creating a medical advance directive? - - - - - No - patient declined information -    Tobacco Social History   Tobacco Use  Smoking Status Never Smoker  Smokeless Tobacco Never Used     Counseling given: Not Answered   Clinical Intake:  Pre-visit preparation completed: Yes  Pain : 0-10 Pain Score: 2  Pain Type: Chronic pain Pain Location: Hip Pain Orientation: Right Pain Descriptors / Indicators: Aching(arthritis) Pain Onset: More than a month ago Pain Frequency: Constant     BMI - recorded: 28.02 Nutritional Status: BMI 25 -29 Overweight Nutritional Risks: None Diabetes: Yes CBG done?: No Did pt. bring in CBG monitor from home?: No   Nutrition Risk Assessment:  Has the patient had any N/V/D within the last 2 months?  No  Does the patient have any non-healing wounds?  No  Has the patient had any unintentional weight loss or weight gain?  No   Diabetes:  Is the patient diabetic?  Yes  If  diabetic, was a CBG obtained today?  No  Did the patient bring in their glucometer from home?  No  How often do you monitor your CBG's? 3 times per day.   Financial Strains and Diabetes Management:  Are you having any financial strains with the device, your supplies or your medication? No .  Does the patient want to be seen by Chronic Care Management for management of their diabetes?  Yes  Would the patient like to be referred to a Nutritionist or for Diabetic Management?  No   Diabetic Exams:  Diabetic Eye Exam: Completed 05/02/17 negative retinopathy.   Diabetic Foot Exam: Completed 09/07/17.   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 Allergy   . Arthritis   . Cataract   . Diabetes mellitus without complication ([place]   . Hyperlipidemia   . Hypertension   . OSA (obstructive sleep apnea) 08/23/2016  . Osteoporosis   . Sleep apnea    Past Surgical History:  Procedure Laterality Date  . ABDOMINAL HYSTERECTOMY     Family History  Problem Relation Age of Onset  . Breast cancer  Other 40  . Alcohol abuse Father   . Mental illness Father   . Stroke Father   . Diabetes Sister   . Asthma Brother   . Diabetes Brother   . Diabetes Maternal Aunt   . Heart disease Maternal Aunt   . Diabetes Maternal Uncle   . Early death Maternal Uncle   . Breast cancer Cousin 39       mat cousin   Social History   Socioeconomic History  . Marital status: Married    Spouse name: Not on file  . Number of children: 1  . Years of education: Not on file  . Highest education level: Doctorate  Occupational History  . Not on file  Social Needs  . Financial resource strain: Not hard at all  . Food insecurity:     Worry: Never true    Inability: Never true  . Transportation needs:    Medical: No    Non-medical: No  Tobacco Use  . Smoking status: Never Smoker  . Smokeless tobacco: Never Used  Substance and Sexual Activity  . Alcohol use: No    Alcohol/week: 1.0 standard drinks    Types: 1 Standard drinks or equivalent per week  . Drug use: No  . Sexual activity: Not Currently  Lifestyle  . Physical activity:    Days per week: 2 days    Minutes per session: 60 min  . Stress: To some extent  Relationships  . Social connections:    Talks on phone: More than three times a week    Gets together: More than three times a week    Attends religious service: More than 4 times per year    Active member of club or organization: Yes    Attends meetings of clubs or organizations: More than 4 times per year    Relationship status: Married  Other Topics Concern  . Not on file  Social History Narrative  . Not on file    Outpatient Encounter Medications as of 05/15/2018  Medication Sig  . acetaminophen (TYLENOL) 500 MG tablet Take 1,000 mg by mouth 2 (two) times daily.  Marland Kitchen amLODipine (NORVASC) 10 MG tablet Take 1 tablet by mouth daily  . Blood Glucose Monitoring Suppl (ONETOUCH VERIO) w/Device KIT 1 each by Does not apply route 4 (four) times daily.  . canagliflozin (INVOKANA) 300 MG TABS tablet Take 1 tablet (300 mg total) by mouth daily before breakfast.  . cetirizine (ZYRTEC) 10 MG tablet Take 10 mg by mouth daily as needed.  . Cholecalciferol (VITAMIN D) 2000 UNITS CAPS Take 1 capsule (2,000 Units total) by mouth daily.  . Continuous Blood Gluc Receiver (FREESTYLE LIBRE 14 DAY READER) DEVI See admin instructions.  . Continuous Blood Gluc Sensor (FREESTYLE LIBRE 14 DAY SENSOR) MISC 1 Units by Does not apply route every 14 (fourteen) days.  Marland Kitchen GINKGO BILOBA COMPLEX PO Take by mouth.  Marland Kitchen GLUCOSAMINE HCL PO Take by mouth.  Marland Kitchen guaiFENesin-codeine (ROBITUSSIN AC) 100-10 MG/5ML syrup Take 5 mLs by mouth at  bedtime.  . Insulin Disposable Pump (V-GO 20) KIT USE 1 V-GO POD DAILY FOR INSULIN DELIVERY.  Marland Kitchen losartan (COZAAR) 100 MG tablet Take 1 tablet by mouth daily (Patient taking differently: Take 1 half tablet by mouth daily)  . meloxicam (MOBIC) 15 MG tablet TAKE 1 TABLET BY MOUTH ONCE DAILY AS NEEDED FOR PAIN (SEVERE PAIN)  . metFORMIN (GLUCOPHAGE-XR) 500 MG 24 hr tablet Take 1 tablet by mouth twice a day with meals  .  NON FORMULARY CPAP @@ 9 cm H2O nightly  . NOVOLIN R RELION 100 UNIT/ML injection 75 UNITS PER THE V-GO PUMP  . ONETOUCH VERIO test strip Use to test blood sugar 4 times daily  . pravastatin (PRAVACHOL) 20 MG tablet Take 1 tablet (20 mg total) by mouth daily.  . ReliOn Ultra Thin Lancets MISC Inject 1 Units as directed 3 (three) times daily as needed.   No facility-administered encounter medications on file as of 05/15/2018.     Activities of Daily Living In your present state of health, do you have any difficulty performing the following activities: 05/15/2018 02/22/2018  Hearing? N N  Comment declines hearing aids -  Vision? N N  Comment wears glasses -  Difficulty concentrating or making decisions? Y N  Walking or climbing stairs? N N  Dressing or bathing? N N  Doing errands, shopping? N N  Preparing Food and eating ? N -  Using the Toilet? N -  In the past six months, have you accidently leaked urine? N -  Do you have problems with loss of bowel control? N -  Managing your Medications? N -  Managing your Finances? N -  Housekeeping or managing your Housekeeping? N -  Some recent data might be hidden    Patient Care Team: Glean Hess, MD as PCP - General (Internal Medicine) Elayne Snare, MD as Consulting Physician (Endocrinology) Sharlet Salina, MD as Referring Physician (Physical Medicine and Rehabilitation) Samara Deist, DPM as Referring Physician (Podiatry) Leanor Kail, MD (Orthopedic Surgery)    Assessment:   This is a routine wellness  examination for Melinda Ford.  Exercise Activities and Dietary recommendations Current Exercise Habits: Structured exercise class, Type of exercise: Other - see comments(exercise class), Time (Minutes): 60, Frequency (Times/Week): 2, Weekly Exercise (Minutes/Week): 120, Intensity: Moderate, Exercise limited by: orthopedic condition(s)  Goals    . Patient Stated     Patient would like to travel and go out more with her husband.        Fall Risk Fall Risk  05/15/2018 03/21/2017 11/20/2016 02/04/2016 10/01/2015  Falls in the past year? 0 No No No No  Number falls in past yr: 0 - - - -  Injury with Fall? 0 - - - -  Follow up Falls prevention discussed - - - -   FALL RISK PREVENTION PERTAINING TO THE HOME:  Any stairs in or around the home? Yes  If so, are they are without handrails? Yes   Home free of loose throw rugs in walkways, pet beds, electrical cords, etc? Yes  Adequate lighting in your home to reduce risk of falls? Yes   ASSISTIVE DEVICES UTILIZED TO PREVENT FALLS:  Life alert? No  Use of a cane, walker or w/c? No  Grab bars in the bathroom? Yes  Shower chair or bench in shower? No  Elevated toilet seat or a handicapped toilet? No   DME ORDERS:  DME order needed?  Yes   TIMED UP AND GO:  Was the test performed? Yes .  Length of time to ambulate 10 feet: 6 sec.   GAIT:  Appearance of gait: Gait stead-fast and without the use of an assistive device.   Education: Fall risk prevention has been discussed.  Intervention(s) required? No   Depression Screen PHQ 2/9 Scores 05/15/2018 03/21/2017 11/20/2016 02/04/2016  PHQ - 2 Score 1 0 0 1  PHQ- 9 Score 5 0 - -     Cognitive Function MMSE - Mini Mental State Exam  07/26/2015  Orientation to time 5  Orientation to Place 4  Registration 3  Attention/ Calculation 5  Recall 3  Language- name 2 objects 2  Language- repeat 1  Language- follow 3 step command 3  Language- read & follow direction 1  Write a sentence 1  Copy  design 1  Total score 29     6CIT Screen 05/15/2018 11/20/2016  What Year? 0 points 0 points  What month? 0 points 0 points  What time? 0 points 0 points  Count back from 20 0 points 0 points  Months in reverse 0 points 0 points  Repeat phrase 0 points 0 points  Total Score 0 0    Immunization History  Administered Date(s) Administered  . Influenza, High Dose Seasonal PF 01/25/2016, 01/22/2017, 01/18/2018  . Influenza,inj,Quad PF,6+ Mos 02/24/2014, 01/15/2015  . Influenza-Unspecified 01/09/2011, 12/15/2011  . Pneumococcal Conjugate-13 04/16/2014  . Pneumococcal Polysaccharide-23 05/06/2010  . Tdap 01/22/2017  . Zoster 10/25/2010  . Zoster Recombinat (Shingrix) 01/18/2018    Qualifies for Shingles Vaccine? 1st dose done 01/18/18 Tdap: Up to date  Flu Vaccine: Up to date  Pneumococcal Vaccine: Up to date   Screening Tests Health Maintenance  Topic Date Due  . MAMMOGRAM  05/02/2018  . OPHTHALMOLOGY EXAM  05/03/2018  . HEMOGLOBIN A1C  08/14/2018  . FOOT EXAM  09/08/2018  . COLONOSCOPY  11/02/2023  . TETANUS/TDAP  01/23/2027  . INFLUENZA VACCINE  Completed  . DEXA SCAN  Completed  . Hepatitis C Screening  Completed  . PNA vac Low Risk Adult  Completed    Cancer Screenings:  Colorectal Screening: Completed 2015. Repeat every 10 years  Mammogram: Completed 05/02/17. Scheduled for 05/21/18  Bone Density: Completed 08/01/17. Results reflect OSTEOPOROSIS. Repeat every 2 years.   Lung Cancer Screening: (Low Dose CT Chest recommended if Age 80-80 years, 30 pack-year currently smoking OR have quit w/in 15years.) does not qualify.   Additional Screening:  Hepatitis C Screening: does qualify; Completed 04/16/14  Vision Screening: Recommended annual ophthalmology exams for early detection of glaucoma and other disorders of the eye. Is the patient up to date with their annual eye exam?  Yes  Who is the provider or what is the name of the office in which the pt attends annual  eye exams? [facility]   Dental Screening: Recommended annual dental exams for proper oral hygiene  Community Resource Referral:  CRR required this visit?  No      Plan:     I have personally reviewed and addressed the Medicare Annual Wellness questionnaire and have noted the following in the patient's chart:  A. Medical and social history B. Use of alcohol, tobacco or illicit drugs  C. Current medications and supplements D. Functional ability and status E.  Nutritional status F.  Physical activity G. Advance directives H. List of other physicians I.  Hospitalizations, surgeries, and ER visits in previous 12 months J.  [place] such as hearing and vision if needed, cognitive and depression L. Referrals and appointments   In addition, I have reviewed and discussed with patient certain preventive protocols, quality metrics, and best practice recommendations. A written personalized care plan for preventive services as well as general preventive health recommendations were provided to patient.   Signed,  Clemetine Marker, LPN Nurse Health Advisor   Nurse Notes: pt c/o dry, itchy skin during the winter. Encouraged hydration and moisturizer with gentle soaps and detergents. She is also interested in speaking to someone in  care management regarding her diabetes because her endocrinologist is in [place] and she would prefer to stay local. She is currently on an insulin pump and recently given the freestyle libre test system but has not started using it because she would like additional information on how to use it and she did not bring it with her today. I will reach out to care management for options for this patient and notify her.

## 2018-05-17 ENCOUNTER — Other Ambulatory Visit: Payer: Self-pay

## 2018-05-17 ENCOUNTER — Ambulatory Visit (INDEPENDENT_AMBULATORY_CARE_PROVIDER_SITE_OTHER): Payer: PPO | Admitting: Internal Medicine

## 2018-05-17 ENCOUNTER — Encounter: Payer: Self-pay | Admitting: Internal Medicine

## 2018-05-17 ENCOUNTER — Ambulatory Visit: Payer: PPO | Admitting: Endocrinology

## 2018-05-17 VITALS — BP 112/62 | HR 76 | Ht 62.0 in | Wt 153.0 lb

## 2018-05-17 DIAGNOSIS — Z1231 Encounter for screening mammogram for malignant neoplasm of breast: Secondary | ICD-10-CM | POA: Diagnosis not present

## 2018-05-17 DIAGNOSIS — E1140 Type 2 diabetes mellitus with diabetic neuropathy, unspecified: Secondary | ICD-10-CM

## 2018-05-17 DIAGNOSIS — Z0000 Encounter for general adult medical examination without abnormal findings: Secondary | ICD-10-CM

## 2018-05-17 DIAGNOSIS — R10816 Epigastric abdominal tenderness: Secondary | ICD-10-CM

## 2018-05-17 DIAGNOSIS — I10 Essential (primary) hypertension: Secondary | ICD-10-CM

## 2018-05-17 DIAGNOSIS — E7800 Pure hypercholesterolemia, unspecified: Secondary | ICD-10-CM | POA: Insufficient documentation

## 2018-05-17 DIAGNOSIS — G4733 Obstructive sleep apnea (adult) (pediatric): Secondary | ICD-10-CM

## 2018-05-17 DIAGNOSIS — E785 Hyperlipidemia, unspecified: Secondary | ICD-10-CM | POA: Insufficient documentation

## 2018-05-17 DIAGNOSIS — E1169 Type 2 diabetes mellitus with other specified complication: Secondary | ICD-10-CM | POA: Insufficient documentation

## 2018-05-17 LAB — POCT URINALYSIS DIPSTICK
Bilirubin, UA: NEGATIVE
Blood, UA: NEGATIVE
Glucose, UA: POSITIVE — AB
Ketones, UA: NEGATIVE
Leukocytes, UA: NEGATIVE
Nitrite, UA: NEGATIVE
PROTEIN UA: NEGATIVE
Spec Grav, UA: 1.015 (ref 1.010–1.025)
Urobilinogen, UA: 0.2 E.U./dL
pH, UA: 5 (ref 5.0–8.0)

## 2018-05-17 NOTE — Patient Instructions (Addendum)
Schedule DM eye exam  DASH Eating Plan DASH stands for "Dietary Approaches to Stop Hypertension." The DASH eating plan is a healthy eating plan that has been shown to reduce high blood pressure (hypertension). It may also reduce your risk for type 2 diabetes, heart disease, and stroke. The DASH eating plan may also help with weight loss. What are tips for following this plan?  General guidelines  Avoid eating more than 2,300 mg (milligrams) of salt (sodium) a day. If you have hypertension, you may need to reduce your sodium intake to 1,500 mg a day.  Limit alcohol intake to no more than 1 drink a day for nonpregnant women and 2 drinks a day for men. One drink equals 12 oz of beer, 5 oz of wine, or 1 oz of hard liquor.  Work with your health care provider to maintain a healthy body weight or to lose weight. Ask what an ideal weight is for you.  Get at least 30 minutes of exercise that causes your heart to beat faster (aerobic exercise) most days of the week. Activities may include walking, swimming, or biking.  Work with your health care provider or diet and nutrition specialist (dietitian) to adjust your eating plan to your individual calorie needs. Reading food labels   Check food labels for the amount of sodium per serving. Choose foods with less than 5 percent of the Daily Value of sodium. Generally, foods with less than 300 mg of sodium per serving fit into this eating plan.  To find whole grains, look for the word "whole" as the first word in the ingredient list. Shopping  Buy products labeled as "low-sodium" or "no salt added."  Buy fresh foods. Avoid canned foods and premade or frozen meals. Cooking  Avoid adding salt when cooking. Use salt-free seasonings or herbs instead of table salt or sea salt. Check with your health care provider or pharmacist before using salt substitutes.  Do not fry foods. Cook foods using healthy methods such as baking, boiling, grilling, and  broiling instead.  Cook with heart-healthy oils, such as olive, canola, soybean, or sunflower oil. Meal planning  Eat a balanced diet that includes: ? 5 or more servings of fruits and vegetables each day. At each meal, try to fill half of your plate with fruits and vegetables. ? Up to 6-8 servings of whole grains each day. ? Less than 6 oz of lean meat, poultry, or fish each day. A 3-oz serving of meat is about the same size as a deck of cards. One egg equals 1 oz. ? 2 servings of low-fat dairy each day. ? A serving of nuts, seeds, or beans 5 times each week. ? Heart-healthy fats. Healthy fats called Omega-3 fatty acids are found in foods such as flaxseeds and coldwater fish, like sardines, salmon, and mackerel.  Limit how much you eat of the following: ? Canned or prepackaged foods. ? Food that is high in trans fat, such as fried foods. ? Food that is high in saturated fat, such as fatty meat. ? Sweets, desserts, sugary drinks, and other foods with added sugar. ? Full-fat dairy products.  Do not salt foods before eating.  Try to eat at least 2 vegetarian meals each week.  Eat more home-cooked food and less restaurant, buffet, and fast food.  When eating at a restaurant, ask that your food be prepared with less salt or no salt, if possible. What foods are recommended? The items listed may not be a complete list.  Talk with your dietitian about what dietary choices are best for you. Grains Whole-grain or whole-wheat bread. Whole-grain or whole-wheat pasta. Brown rice. [place]. Bulgur. Whole-grain and low-sodium cereals. Pita bread. Low-fat, low-sodium crackers. Whole-wheat flour tortillas. Vegetables Fresh or frozen vegetables (raw, steamed, roasted, or grilled). Low-sodium or reduced-sodium tomato and vegetable juice. Low-sodium or reduced-sodium tomato sauce and tomato paste. Low-sodium or reduced-sodium canned vegetables. Fruits All fresh, dried, or frozen fruit. Canned  fruit in natural juice (without added sugar). Meat and other protein foods Skinless chicken or [place]. Ground chicken or [place]. Pork with fat trimmed off. Fish and seafood. Egg whites. Dried beans, peas, or lentils. Unsalted nuts, nut butters, and seeds. Unsalted canned beans. Lean cuts of beef with fat trimmed off. Low-sodium, lean deli meat. Dairy Low-fat (1%) or fat-free (skim) milk. Fat-free, low-fat, or reduced-fat cheeses. Nonfat, low-sodium ricotta or cottage cheese. Low-fat or nonfat yogurt. Low-fat, low-sodium cheese. Fats and oils Soft margarine without trans fats. Vegetable oil. Low-fat, reduced-fat, or light mayonnaise and salad dressings (reduced-sodium). Canola, safflower, olive, soybean, and sunflower oils. Avocado. Seasoning and other foods Herbs. Spices. Seasoning mixes without salt. Unsalted popcorn and pretzels. Fat-free sweets. What foods are not recommended? The items listed may not be a complete list. Talk with your dietitian about what dietary choices are best for you. Grains Baked goods made with fat, such as croissants, muffins, or some breads. Dry pasta or rice meal packs. Vegetables Creamed or fried vegetables. Vegetables in a cheese sauce. Regular canned vegetables (not low-sodium or reduced-sodium). Regular canned tomato sauce and paste (not low-sodium or reduced-sodium). Regular tomato and vegetable juice (not low-sodium or reduced-sodium). Angie Fava. Olives. Fruits Canned fruit in a light or heavy syrup. Fried fruit. Fruit in cream or butter sauce. Meat and other protein foods Fatty cuts of meat. Ribs. Fried meat. Berniece Salines. Sausage. Bologna and other processed lunch meats. Salami. Fatback. Hotdogs. Bratwurst. Salted nuts and seeds. Canned beans with added salt. Canned or smoked fish. Whole eggs or egg yolks. Chicken or [place] with skin. Dairy Whole or 2% milk, cream, and half-and-half. Whole or full-fat cream cheese. Whole-fat or sweetened yogurt. Full-fat cheese.  Nondairy creamers. Whipped toppings. Processed cheese and cheese spreads. Fats and oils Butter. Stick margarine. Lard. Shortening. Ghee. Bacon fat. Tropical oils, such as coconut, palm kernel, or palm oil. Seasoning and other foods Salted popcorn and pretzels. Onion salt, garlic salt, seasoned salt, table salt, and sea salt. Worcestershire sauce. Tartar sauce. Barbecue sauce. Teriyaki sauce. Soy sauce, including reduced-sodium. Steak sauce. Canned and packaged gravies. Fish sauce. Oyster sauce. Cocktail sauce. Horseradish that you find on the shelf. Ketchup. Mustard. Meat flavorings and tenderizers. Bouillon cubes. Hot sauce and Tabasco sauce. Premade or packaged marinades. Premade or packaged taco seasonings. Relishes. Regular salad dressings. Where to find more information:  National Heart, Lung, and [place]: https://wilson-eaton.com/  American Heart Association: www.heart.org Summary  The DASH eating plan is a healthy eating plan that has been shown to reduce high blood pressure (hypertension). It may also reduce your risk for type 2 diabetes, heart disease, and stroke.  With the DASH eating plan, you should limit salt (sodium) intake to 2,300 mg a day. If you have hypertension, you may need to reduce your sodium intake to 1,500 mg a day.  When on the DASH eating plan, aim to eat more fresh fruits and vegetables, whole grains, lean proteins, low-fat dairy, and heart-healthy fats.  Work with your health care provider or diet and nutrition specialist (dietitian) to adjust  your eating plan to your individual calorie needs. This information is not intended to replace advice given to you by your health care provider. Make sure you discuss any questions you have with your health care provider. Document Released: 03/09/2011 Document Revised: 03/13/2016 Document Reviewed: 03/13/2016 Elsevier Interactive Patient Education  2019 [employer].

## 2018-05-17 NOTE — Progress Notes (Signed)
Date:  05/17/2018   Name:  Melinda Ford   DOB:  Dec 08, 1945   MRN:  [id number]   Chief Complaint: Annual Exam (Breast Exam. ) Melinda Ford is a 73 y.o. female who presents today for her Complete Annual Exam. She feels fairly well. She reports exercising at the gym at least 2 times per week. She reports she is sleeping well. She denies breast symptoms.  Hypertension  This is a chronic problem. The problem has been waxing and waning since onset. The problem is controlled (generally controlled but sometimes higher). Associated symptoms include headaches. Pertinent negatives include no blurred vision, chest pain, palpitations, peripheral edema or shortness of breath. Past treatments include angiotensin blockers and calcium channel blockers. The current treatment provides significant improvement. Compliance problems: has been eating more salty foods.  There is no history of kidney disease or retinopathy.  Diabetes  She presents for her follow-up diabetic visit. She has type 2 diabetes mellitus. Hypoglycemia symptoms include headaches. Pertinent negatives for hypoglycemia include no dizziness, nervousness/anxiousness or tremors. Pertinent negatives for diabetes include no blurred vision, no chest pain, no fatigue, no polydipsia and no polyuria. Pertinent negatives for diabetic complications include no retinopathy. Current diabetic treatment includes intensive insulin program. She is compliant with treatment all of the time. Her weight is stable. An ACE inhibitor/angiotensin II receptor blocker is being taken. Eye exam is not current.  Hyperlipidemia  This is a chronic problem. Pertinent negatives include no chest pain or shortness of breath. Current antihyperlipidemic treatment includes statins (just started pravachol in November).  OSA - diagnosed with sleep apnea in 2018.  Does not feel any different but using it regularly every night.  She is only aware that she no longer snores.  She has no daytime  somnolence, fatigue, morning headaches.   Review of Systems  Constitutional: Negative for chills, fatigue and fever.  HENT: Negative for congestion, hearing loss, tinnitus, trouble swallowing and voice change.   Eyes: Negative for blurred vision and visual disturbance.  Respiratory: Negative for cough, chest tightness, shortness of breath and wheezing.   Cardiovascular: Negative for chest pain, palpitations and leg swelling.  Gastrointestinal: Positive for abdominal pain (mid abdomen). Negative for constipation, diarrhea and vomiting.  Endocrine: Negative for polydipsia and polyuria.  Genitourinary: Negative for dysuria, frequency, genital sores, vaginal bleeding and vaginal discharge.  Musculoskeletal: Negative for arthralgias, gait problem and joint swelling.  Skin: Negative for color change and rash.  Neurological: Positive for headaches. Negative for dizziness, tremors and light-headedness.  Hematological: Negative for adenopathy. Does not bruise/bleed easily.  Psychiatric/Behavioral: Negative for dysphoric mood and sleep disturbance. The patient is not nervous/anxious.     Patient Active Problem List   Diagnosis Date Noted  . 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 Chronic midline thoracic back pain 02/22/2018  . Right lateral epicondylitis 06/04/2017  . Tinea versicolor 09/20/2016  . OSA (obstructive sleep apnea) 08/23/2016  . Allergic rhinitis 07/13/2016  . Radiculopathy of lumbosacral region 06/22/2016  . Right sided sciatica 03/09/2016  . Overweight (BMI 25.0-29.9) 10/01/2015  . Vitamin D deficiency 09/18/2014  . Onychomycosis 09/16/2014  . Degenerative disc disease, lumbar 09/16/2014  . Benign essential HTN 10/10/2010  . Osteoporosis 10/10/2010  . 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 Wheat Bran Hives  . Gramineae Pollens     Past Surgical History:  Procedure Laterality  Date  . ABDOMINAL HYSTERECTOMY  Social History   Tobacco Use  . Smoking status: Never Smoker  . Smokeless tobacco: Never Used  Substance Use Topics  . Alcohol use: No    Alcohol/week: 1.0 standard drinks    Types: 1 Standard drinks or equivalent per week  . Drug use: No     Medication list has been reviewed and updated.  Current Meds  Medication Sig  . acetaminophen (TYLENOL) 500 MG tablet Take 1,000 mg by mouth 2 (two) times daily.  Marland Kitchen amLODipine (NORVASC) 10 MG tablet Take 1 tablet by mouth daily  . Blood Glucose Monitoring Suppl (ONETOUCH VERIO) w/Device KIT 1 each by Does not apply route 4 (four) times daily.  . canagliflozin (INVOKANA) 300 MG TABS tablet Take 1 tablet (300 mg total) by mouth daily before breakfast.  . cetirizine (ZYRTEC) 10 MG tablet Take 10 mg by mouth daily as needed.  . Cholecalciferol (VITAMIN D) 2000 UNITS CAPS Take 1 capsule (2,000 Units total) by mouth daily.  . Continuous Blood Gluc Receiver (FREESTYLE LIBRE 14 DAY READER) DEVI See admin instructions.  . Continuous Blood Gluc Sensor (FREESTYLE LIBRE 14 DAY SENSOR) MISC 1 Units by Does not apply route every 14 (fourteen) days.  Marland Kitchen GINKGO BILOBA COMPLEX PO Take by mouth.  Marland Kitchen GLUCOSAMINE HCL PO Take by mouth.  Marland Kitchen guaiFENesin-codeine (ROBITUSSIN AC) 100-10 MG/5ML syrup Take 5 mLs by mouth at bedtime.  . Insulin Disposable Pump (V-GO 20) KIT USE 1 V-GO POD DAILY FOR INSULIN DELIVERY.  Marland Kitchen losartan (COZAAR) 100 MG tablet Take 1 tablet by mouth daily (Patient taking differently: Take 1 half tablet by mouth daily)  . meloxicam (MOBIC) 15 MG tablet TAKE 1 TABLET BY MOUTH ONCE DAILY AS NEEDED FOR PAIN (SEVERE PAIN)  . metFORMIN (GLUCOPHAGE-XR) 500 MG 24 hr tablet Take 1 tablet by mouth twice a day with meals  . NON FORMULARY CPAP @@ 9 cm H2O nightly  . NOVOLIN R RELION 100 UNIT/ML injection 75 UNITS PER THE V-GO PUMP  . ONETOUCH VERIO test strip Use to test blood sugar 4 times daily  . pravastatin (PRAVACHOL)  20 MG tablet Take 1 tablet (20 mg total) by mouth daily.  . ReliOn Ultra Thin Lancets MISC Inject 1 Units as directed 3 (three) times daily as needed.    PHQ 2/9 Scores 05/15/2018 03/21/2017 11/20/2016 02/04/2016  PHQ - 2 Score 1 0 0 1  PHQ- 9 Score 5 0 - -    Physical Exam Vitals signs and nursing note reviewed.  Constitutional:      General: She is not in acute distress.    Appearance: She is well-developed.  HENT:     Head: Normocephalic and atraumatic.     Right Ear: Tympanic membrane and ear canal normal.     Left Ear: Tympanic membrane and ear canal normal.     Nose:     Right Sinus: No maxillary sinus tenderness.     Left Sinus: No maxillary sinus tenderness.     Mouth/Throat:     Pharynx: Uvula midline.  Eyes:     General: No scleral icterus.       Right eye: No discharge.        Left eye: No discharge.     Conjunctiva/sclera: Conjunctivae normal.  Neck:     Musculoskeletal: Normal range of motion. No erythema.     Thyroid: No thyromegaly.     Vascular: No carotid bruit.  Cardiovascular:     Rate and Rhythm: Normal rate and regular rhythm.  Pulses: Normal pulses.     Heart sounds: Normal heart sounds.  Pulmonary:     Effort: Pulmonary effort is normal. No respiratory distress.     Breath sounds: No wheezing.  Chest:     Breasts:        Right: No mass, nipple discharge, skin change or tenderness.        Left: No mass, nipple discharge, skin change or tenderness.  Abdominal:     General: Bowel sounds are normal.     Palpations: Abdomen is soft.     Tenderness: There is abdominal tenderness in the epigastric area. There is no guarding or rebound.     Hernia: No hernia is present.  Musculoskeletal: Normal range of motion.  Lymphadenopathy:     Cervical: No cervical adenopathy.  Skin:    General: Skin is warm and dry.     Findings: No rash.  Neurological:     Mental Status: She is alert and oriented to person, place, and time.     Cranial Nerves: No cranial  nerve deficit.     Sensory: No sensory deficit.     Deep Tendon Reflexes: Reflexes are normal and symmetric.  Psychiatric:        Speech: Speech normal.        Behavior: Behavior normal.        Thought Content: Thought content normal.     BP 112/62   Pulse 76   Ht _0  (1.575 m)   Wt 153 lb (69.4 kg)   SpO2 96%   BMI 27.98 kg/m   Assessment and Plan: 1. Annual physical exam Normal exam Continue healthy diet, cut back on sodium - POCT urinalysis dipstick  2. Encounter for screening mammogram for breast cancer scheduled  3. Benign essential HTN Fair control Continue same medications and limit sodium - CBC with Differential/Platelet - TSH  4. OSA (obstructive sleep apnea) Continue CPAP use  5. Type 2 diabetes, controlled, with neuropathy ([place]) Followed by endo - POCT urinalysis dipstick  6. Epigastric abdominal tenderness without rebound tenderness Will check lipase If persistent, may need further evaluation - Lipase  7. Hypercholesterolemia Now on statin therapy - Lipid panel   Partially dictated using Editor, commissioning. Any errors are unintentional.  Halina Maidens, MD [facility]  05/17/2018

## 2018-05-18 LAB — CBC WITH DIFFERENTIAL/PLATELET
Basophils Absolute: 0.1 10*3/uL (ref 0.0–0.2)
Basos: 1 %
EOS (ABSOLUTE): 0.1 10*3/uL (ref 0.0–0.4)
EOS: 2 %
HEMOGLOBIN: 14.8 g/dL (ref 11.1–15.9)
Hematocrit: 44.1 % (ref 34.0–46.6)
Immature Grans (Abs): 0 10*3/uL (ref 0.0–0.1)
Immature Granulocytes: 0 %
Lymphocytes Absolute: 2.3 10*3/uL (ref 0.7–3.1)
Lymphs: 48 %
MCH: 31.2 pg (ref 26.6–33.0)
MCHC: 33.6 g/dL (ref 31.5–35.7)
MCV: 93 fL (ref 79–97)
Monocytes Absolute: 0.4 10*3/uL (ref 0.1–0.9)
Monocytes: 9 %
Neutrophils Absolute: 1.9 10*3/uL (ref 1.4–7.0)
Neutrophils: 40 %
Platelets: 248 10*3/uL (ref 150–450)
RBC: 4.74 x10E6/uL (ref 3.77–5.28)
RDW: 12 % (ref 11.7–15.4)
WBC: 4.7 10*3/uL (ref 3.4–10.8)

## 2018-05-18 LAB — TSH: TSH: 1.54 u[IU]/mL (ref 0.450–4.500)

## 2018-05-18 LAB — LIPASE: Lipase: 21 U/L (ref 14–85)

## 2018-05-18 LAB — LIPID PANEL
Chol/HDL Ratio: 2.2 ratio (ref 0.0–4.4)
Cholesterol, Total: 196 mg/dL (ref 100–199)
HDL: 90 mg/dL (ref >39)
LDL Calculated: 97 mg/dL (ref 0–99)
TRIGLYCERIDES: 47 mg/dL (ref 0–149)
VLDL Cholesterol Cal: 9 mg/dL (ref 5–40)

## 2018-05-20 NOTE — Progress Notes (Signed)
Please call pt regarding labs.   Thank you.

## 2018-05-21 ENCOUNTER — Ambulatory Visit
Admission: RE | Admit: 2018-05-21 | Discharge: 2018-05-21 | Disposition: A | Discharge status: Home / Self Care | Payer: PPO | Source: Ambulatory Visit | Attending: Internal Medicine | Admitting: Internal Medicine

## 2018-05-21 DIAGNOSIS — Z1231 Encounter for screening mammogram for malignant neoplasm of breast: Secondary | ICD-10-CM | POA: Diagnosis not present

## 2018-05-27 DIAGNOSIS — B351 Tinea unguium: Secondary | ICD-10-CM | POA: Diagnosis not present

## 2018-05-27 DIAGNOSIS — M79674 Pain in right toe(s): Secondary | ICD-10-CM | POA: Diagnosis not present

## 2018-05-27 DIAGNOSIS — M79675 Pain in left toe(s): Secondary | ICD-10-CM | POA: Diagnosis not present

## 2018-06-10 ENCOUNTER — Other Ambulatory Visit: Payer: Self-pay

## 2018-06-10 MED ORDER — GLUCOSE BLOOD VI STRP: ORAL_STRIP | 1 refills | Status: DC

## 2018-06-12 ENCOUNTER — Other Ambulatory Visit: Payer: Self-pay

## 2018-06-12 DIAGNOSIS — I10 Essential (primary) hypertension: Secondary | ICD-10-CM

## 2018-06-12 MED ORDER — LOSARTAN POTASSIUM 100 MG PO TABS: 100.0000 mg | ORAL_TABLET | Freq: Every day | ORAL | 1 refills | Status: DC

## 2018-06-12 MED ORDER — AMLODIPINE BESYLATE 10 MG PO TABS: 10.0000 mg | ORAL_TABLET | Freq: Every day | ORAL | 1 refills | Status: DC

## 2018-06-13 ENCOUNTER — Telehealth: Payer: Self-pay | Admitting: Endocrinology

## 2018-06-13 ENCOUNTER — Other Ambulatory Visit: Payer: Self-pay

## 2018-06-13 MED ORDER — METFORMIN HCL ER 500 MG PO TB24: 500.0000 mg | ORAL_TABLET | Freq: Two times a day (BID) | ORAL | 1 refills | Status: DC

## 2018-06-13 NOTE — Telephone Encounter (Signed)
MEDICATION: Metformin  [facility]Y:  [facility] ([place]   IS THIS A 90 DAY SUPPLY : Yes  IS PATIENT OUT OF MEDICATION:   IF NOT; HOW MUCH IS LEFT: Half a month  LAST APPOINTMENT DATE: @3 /12/2018  NEXT APPOINTMENT DATE:@3 /20/2020  DO WE HAVE YOUR PERMISSION TO LEAVE A DETAILED MESSAGE:  OTHER COMMENTS:    **Let patient know to contact pharmacy at the end of the day to make sure medication is ready. **  ** Please notify patient to allow 48-72 hours to process**  **Encourage patient to contact the pharmacy for refills or they can request refills through [facility]**

## 2018-06-13 NOTE — Telephone Encounter (Signed)
Rx sent 

## 2018-06-21 ENCOUNTER — Encounter: Payer: Self-pay | Admitting: Endocrinology

## 2018-06-21 ENCOUNTER — Other Ambulatory Visit: Payer: Self-pay

## 2018-06-21 ENCOUNTER — Ambulatory Visit (INDEPENDENT_AMBULATORY_CARE_PROVIDER_SITE_OTHER): Payer: PPO | Admitting: Endocrinology

## 2018-06-21 VITALS — BP 144/70 | HR 67 | Temp 98.6°F | Ht 62.0 in | Wt 153.2 lb

## 2018-06-21 DIAGNOSIS — Z794 Long term (current) use of insulin: Secondary | ICD-10-CM

## 2018-06-21 DIAGNOSIS — E1165 Type 2 diabetes mellitus with hyperglycemia: Secondary | ICD-10-CM

## 2018-06-21 LAB — COMPREHENSIVE METABOLIC PANEL
ALT: 26 U/L (ref 0–35)
AST: 22 U/L (ref 0–37)
Albumin: 4.3 g/dL (ref 3.5–5.2)
Alkaline Phosphatase: 66 U/L (ref 39–117)
BUN: 28 mg/dL — ABNORMAL HIGH (ref 6–23)
CO2: 26 mEq/L (ref 19–32)
Calcium: 10.9 mg/dL — ABNORMAL HIGH (ref 8.4–10.5)
Chloride: 106 mEq/L (ref 96–112)
Creatinine, Ser: 0.75 mg/dL (ref 0.40–1.20)
GFR: 91.46 mL/min (ref >60.00)
Glucose, Bld: 102 mg/dL — ABNORMAL HIGH (ref 70–99)
POTASSIUM: 4.2 meq/L (ref 3.5–5.1)
Sodium: 138 mEq/L (ref 135–145)
Total Bilirubin: 0.4 mg/dL (ref 0.2–1.2)
Total Protein: 7.3 g/dL (ref 6.0–8.3)

## 2018-06-21 NOTE — Progress Notes (Signed)
Patient ID: Melinda Ford, female   DOB: November 26, 1945, 73 y.o.   MRN: [id number]            Reason for Appointment: Follow-up for Type 2 Diabetes   History of Present Illness:          Date of diagnosis of type 2 diabetes mellitus: ?  2004        Background history:   She previously had been on metformin and also Avandia initially Detailed records of her care are not available prior to 2013 or so She was tried on Januvia in 2016 but this was not effective and this was stopped in 5/70 She was started on insulin in 2/16 with small doses of Lantus insulin Her A1c has been consistently over 8% since about 2013  Recent history:   INSULIN regimen is:   V-go 20 unit pump since 9/70/26, boluses 2 clicks with meals     Non-insulin hypoglycemic drugs the patient is taking are: metformin ER 500 mg bid, Invokana 150   Her A1c in June was about the same at  7% and now is 6.7  Current management, blood sugar patterns and problems identified:  She has been able to use her V-go pump consistently now  Also getting samples of Invokana from her PCP now  With this her blood sugars are more consistently controlled also  Review of her meter download indicates the following patterns  After fasting readings are relatively lower if checked before 9 AM and averaging only 108 but higher if checked later in the morning, not clear if some of these are after breakfast  Blood sugars are rarely checked after lunch or dinner  Bedtime readings fluctuate significantly as before and depend on her snacks, will go up to as much as 317 if eating ice cream  She does not think about bolusing for extra snacks or sweets in the evening  Overall she thinks she is trying to cut back on her food with skipping 1 meal a day by rotation  Her fasting readings may be less variable with trying to change the pump more consistently at the same time  She may sometimes feel hypoglycemic with blood sugars in the 80s and tries  to check it at bedtime daily  Although she had been trying to exercise couple of times a week recently has not done any as her gym is closed        Side effects from medications have been: nausea and diarrhea from 1000 MG metformin  Compliance with the medical regimen: fair Hypoglycemia:  level  Glucose monitoring:  done 3 times a day         Glucometer: One Touch Verio          Blood Glucose readings by download     PRE-MEAL Fasting Lunch Dinner Bedtime Overall  Glucose range:  82-199   72, 227  89-304   Mean/median:  135  117   170  135+/-56   POST-MEAL PC Breakfast PC Lunch PC Dinner  Glucose range:   91, 173   Mean/median:      PREVIOUS readings:  PRE-MEAL Fasting Lunch Dinner Bedtime Overall  Glucose range:  72-213   127-220  128-267   Mean/median:  124    188  150   POST-MEAL PC Breakfast PC Lunch PC Dinner  Glucose range:    116-225  Mean/median:         Self-care:   Meal times are:  Breakfast is at  10 AM, dinner 8, lunch 2-3 pm  Typical meal intake: Breakfast is toast, oatmeal, sometimes bacon, milk   Lunch: Sometimes hot dogs, fish sandwich Evening meal is a protein like chicken, steak, pork chop, corn, greens.           Dietician visit, most recent: 2013 CDE visit: 12/2015               Exercise:  may go to the gym 2/7  Weight history:  Wt Readings from Last 3 Encounters:  06/21/18 153 lb 3.2 oz (69.5 kg)  05/17/18 153 lb (69.4 kg)  05/15/18 153 lb 3.2 oz (69.5 kg)    Glycemic control:   Lab Results  Component Value Date   HGBA1C 6.7 (A) 02/13/2018   HGBA1C 7.0 (A) 09/07/2017   HGBA1C 6.9 05/31/2017   Lab Results  Component Value Date   MICROALBUR 1.1 11/02/2017   LDLCALC 97 05/17/2018   CREATININE 0.76 02/13/2018   Lab Results  Component Value Date   MICRALBCREAT 1.5 11/02/2017    Other active problems: See review of systems   Allergies as of 06/21/2018      Reactions   Wheat Bran Hives   Gramineae Pollens       Medication  List       Accurate as of June 21, 2018  2:01 PM. Always use your most recent med list.        acetaminophen 500 MG tablet Commonly known as:  TYLENOL Take 1,000 mg by mouth 2 (two) times daily.   amLODipine 10 MG tablet Commonly known as:  NORVASC Take 1 tablet (10 mg total) by mouth daily.   canagliflozin 300 MG Tabs tablet Commonly known as:  Invokana Take 1 tablet (300 mg total) by mouth daily before breakfast.   cetirizine 10 MG tablet Commonly known as:  ZYRTEC Take 10 mg by mouth daily as needed.   FreeStyle Libre 14 Day Reader Kerrin Mo See admin instructions.   FreeStyle Libre 14 Day Sensor Misc 1 Units by Does not apply route every 14 (fourteen) days.   GINKGO BILOBA COMPLEX PO Take by mouth.   GLUCOSAMINE HCL PO Take by mouth.   glucose blood test strip Commonly known as:  OneTouch Verio Use to test blood sugar 4 times daily   losartan 100 MG tablet Commonly known as:  COZAAR Take 1 tablet (100 mg total) by mouth daily.   meloxicam 15 MG tablet Commonly known as:  MOBIC TAKE 1 TABLET BY MOUTH ONCE DAILY AS NEEDED FOR PAIN (SEVERE PAIN)   metFORMIN 500 MG 24 hr tablet Commonly known as:  GLUCOPHAGE-XR Take 1 tablet (500 mg total) by mouth 2 (two) times daily with a meal.   NON FORMULARY CPAP @@ 9 cm H2O nightly   NovoLIN R ReliOn 100 units/mL injection Generic drug:  insulin regular 75 UNITS PER THE V-GO PUMP   OneTouch Verio w/Device Kit 1 each by Does not apply route 4 (four) times daily.   pravastatin 20 MG tablet Commonly known as:  PRAVACHOL Take 1 tablet (20 mg total) by mouth daily.   ReliOn Ultra Thin Lancets Misc Inject 1 Units as directed 3 (three) times daily as needed.   V-Go 20 Kit USE 1 V-GO POD DAILY FOR INSULIN DELIVERY.   Vitamin D 50 MCG (2000 UT) Caps Take 1 capsule (2,000 Units total) by mouth daily.       Allergies:  Allergies  Allergen Reactions  . Wheat Bran Hives  . Gramineae Pollens  Past Medical  History:  Diagnosis Date  . Allergy   . Arthritis   . Cataract   . Diabetes mellitus without complication ([place])   . Hyperlipidemia   . Hypertension   . OSA (obstructive sleep apnea) 08/23/2016  . Osteoporosis   . Sleep apnea     Past Surgical History:  Procedure Laterality Date  . ABDOMINAL HYSTERECTOMY      Family History  Problem Relation Age of Onset  . Breast cancer Other 40  . Alcohol abuse Father   . Mental illness Father   . Stroke Father   . Diabetes Sister   . Asthma Brother   . Diabetes Brother   . Diabetes Maternal Aunt   . Heart disease Maternal Aunt   . Diabetes Maternal Uncle   . Early death Maternal Uncle   . Breast cancer Cousin 92       mat cousin    Social History:  reports that she has never smoked. She has never used smokeless tobacco. She reports that she does not drink alcohol or use drugs.   Review of Systems  Lipid history: She is  taking pravastatin as prescribed using 20 mg without side effects, recently followed by PCP   Lab Results  Component Value Date   CHOL 196 05/17/2018   HDL 90 05/17/2018   LDLCALC 97 05/17/2018   TRIG 47 05/17/2018   CHOLHDL 2.2 05/17/2018           Hypertension: on treatment for several years, on amlodipine and losartan, Blood pressure variably controlled  Has been followed by PCP  BP Readings from Last 3 Encounters:  06/21/18 (!) 144/70  05/17/18 112/62  05/15/18 130/74     Most recent foot exam: 6/19  LABS:  No visits with results within 1 Week(s) from this visit.  Latest known visit with results is:  Office Visit on 05/17/2018  Component Date Value Ref Range Status  . WBC 05/17/2018 4.7  3.4 - 10.8 x10E3/uL Final  . RBC 05/17/2018 4.74  3.77 - 5.28 x10E6/uL Final  . Hemoglobin 05/17/2018 14.8  11.1 - 15.9 g/dL Final  . Hematocrit 05/17/2018 44.1  34.0 - 46.6 % Final  . MCV 05/17/2018 93  79 - 97 fL Final  . MCH 05/17/2018 31.2  26.6 - 33.0 pg Final  . MCHC 05/17/2018 33.6  31.5 - 35.7  g/dL Final  . RDW 05/17/2018 12.0  11.7 - 15.4 % Final  . Platelets 05/17/2018 248  150 - 450 x10E3/uL Final  . Neutrophils 05/17/2018 40  Not Estab. % Final  . Lymphs 05/17/2018 48  Not Estab. % Final  . Monocytes 05/17/2018 9  Not Estab. % Final  . Eos 05/17/2018 2  Not Estab. % Final  . Basos 05/17/2018 1  Not Estab. % Final  . Neutrophils Absolute 05/17/2018 1.9  1.4 - 7.0 x10E3/uL Final  . Lymphocytes Absolute 05/17/2018 2.3  0.7 - 3.1 x10E3/uL Final  . Monocytes Absolute 05/17/2018 0.4  0.1 - 0.9 x10E3/uL Final  . EOS (ABSOLUTE) 05/17/2018 0.1  0.0 - 0.4 x10E3/uL Final  . Basophils Absolute 05/17/2018 0.1  0.0 - 0.2 x10E3/uL Final  . Immature Granulocytes 05/17/2018 0  Not Estab. % Final  . Immature Grans (Abs) 05/17/2018 0.0  0.0 - 0.1 x10E3/uL Final  . TSH 05/17/2018 1.540  0.450 - 4.500 uIU/mL Final  . Lipase 05/17/2018 21  14 - 85 U/L Final  . Cholesterol, Total 05/17/2018 196  100 - 199 mg/dL Final  .  Triglycerides 05/17/2018 47  0 - 149 mg/dL Final  . HDL 05/17/2018 90  >39 mg/dL Final  . VLDL Cholesterol Cal 05/17/2018 9  5 - 40 mg/dL Final  . LDL Calculated 05/17/2018 97  0 - 99 mg/dL Final  . Chol/HDL Ratio 05/17/2018 2.2  0.0 - 4.4 ratio Final   Comment:                                   T. Chol/HDL Ratio                                             Men  Women                               1/2 Avg.Risk  3.4    3.3                                   Avg.Risk  5.0    4.4                                2X Avg.Risk  9.6    7.1                                3X Avg.Risk 23.4   11.0   . Color, UA 05/17/2018 yellow   Final  . Clarity, UA 05/17/2018 clear   Final  . Glucose, UA 05/17/2018 Positive* Negative Final   2000+  . Bilirubin, UA 05/17/2018 neg   Final  . Ketones, UA 05/17/2018 neg   Final  . Spec Grav, UA 05/17/2018 1.015  1.010 - 1.025 Final  . Blood, UA 05/17/2018 neg   Final  . pH, UA 05/17/2018 5.0  5.0 - 8.0 Final  . Protein, UA 05/17/2018 Negative  Negative  Final  . Urobilinogen, UA 05/17/2018 0.2  0.2 or 1.0 E.U./dL Final  . Nitrite, UA 05/17/2018 neg   Final  . Leukocytes, UA 05/17/2018 Negative  Negative Final  . Appearance 05/17/2018 clear   Final  . Odor 05/17/2018 no odor   Final    Physical Examination:  BP (!) 144/70 (BP Location: Left Arm, Patient Position: Sitting, Cuff Size: Normal)   Pulse 67   Temp 98.6 F (37 C) (Oral)   Ht 5' 2"  (1.575 m)   Wt 153 lb 3.2 oz (69.5 kg)   SpO2 97%   BMI 28.02 kg/m       ASSESSMENT:  Diabetes type 2 on insulin  See history of present illness for detailed discussion of current diabetes management, blood sugar patterns and problems identified  She is being treated with an insulin pump/V-go, Invokana and metformin  A1c is reasonably good at 6.9  Today discussed her blood sugar patterns, problems identified and diet as well as insulin management with her pump in detail Although she has less variability and less frequent hypoglycemia documented on her download she is checking readings mostly fasting and bedtime She just has obtained a freestyle libre but has not started yet Most of her high readings  are from eating snacks or sweets late at night Although she is concerned about low sugars overnight she has an average blood sugar of 108 in the morning and lowest reading of 82 only Blood sugar variability may be less with adding Invokana but has not lost any weight   LIPIDS: LDL 112 before starting pravastatin and 97 Followed by PCP  HYPERTENSION: Blood pressure is overall better and may be benefiting from adding Invokana May be higher today but likely from anxiety  PLAN:   To try and check blood sugars after meals, 2 hours later She will try to get instructions on how to use the freestyle libre Discussed how often to check with this and need to bring the meter on each visit She will need to bolus at least 1-2 clicks for late night snacks especially ice cream May have a protein  snack at bedtime if blood sugar below 100 Continue to change the pump at the same time daily Regular walking for exercise  Her chemistry labs to be checked today   Patient Instructions  Check blood sugars on waking up 4-5 days a week  Also check blood sugars about 2 hours after meals and do this after different meals by rotation  Recommended blood sugar levels on waking up are 90-130 and about 2 hours after meal is 130-160  Please bring your blood sugar monitor to each visit, thank you  Take 2 clicks for ice cream  Walk daily      Counseling time on subjects discussed in assessment and plan sections is over 50% of today's 25 minute visit   Elayne Snare 06/21/2018, 2:01 PM   Note: This office note was prepared with Dragon voice recognition system technology. Any transcriptional errors that result from this process are unintentional.

## 2018-06-21 NOTE — Patient Instructions (Addendum)
Check blood sugars on waking up 4-5 days a week  Also check blood sugars about 2 hours after meals and do this after different meals by rotation  Recommended blood sugar levels on waking up are 90-130 and about 2 hours after meal is 130-160  Please bring your blood sugar monitor to each visit, thank you  Take 2 clicks for ice cream  Walk daily

## 2018-06-28 ENCOUNTER — Telehealth: Payer: Self-pay | Admitting: Endocrinology

## 2018-06-28 NOTE — Telephone Encounter (Signed)
Please advise 

## 2018-06-28 NOTE — Telephone Encounter (Signed)
She has been taking this since August last year.  If the cramps are a new symptom then unlikely related to medication.  She should consult with her PCP

## 2018-06-28 NOTE — Telephone Encounter (Signed)
Patient has been having a lot of cramping in her leg and wanted to know if pravastatin (PRAVACHOL) 20 MG tablet would be causing this.  Please Advise, Thanks

## 2018-07-01 NOTE — Telephone Encounter (Signed)
She can try leaving off the pravastatin for a week and see if it makes a difference

## 2018-07-01 NOTE — Telephone Encounter (Signed)
Called pt and gave her MD message. Pt stated that they are not new, she just has not said anything.

## 2018-07-02 NOTE — Telephone Encounter (Signed)
Called and left detailed voicemail for pt. 

## 2018-07-18 ENCOUNTER — Other Ambulatory Visit: Payer: Self-pay | Admitting: Internal Medicine

## 2018-07-18 DIAGNOSIS — J069 Acute upper respiratory infection, unspecified: Secondary | ICD-10-CM

## 2018-07-18 DIAGNOSIS — B9789 Other viral agents as the cause of diseases classified elsewhere: Principal | ICD-10-CM

## 2018-07-19 ENCOUNTER — Telehealth: Payer: Self-pay

## 2018-07-19 NOTE — Telephone Encounter (Signed)
Patient called asking to refill her cough syrup. Called patient and informed we cannot refill this medication and it is only for one time use. If she is still have a bit of cough she can use Robitussin over the counter. If getting worse, she can do a telephone visit to discuss. LVM about this. Awaiting call.   Benedict Needy, CMA

## 2018-07-29 ENCOUNTER — Telehealth: Payer: Self-pay

## 2018-07-29 NOTE — Telephone Encounter (Signed)
Patient called today saying she request refill from pharmacy for cough syrup weeks ago. Said she has not heard back from our office but called her pharmacy today and they told her we denied RF. Calling again today and left VM asking for why denied.  Called patient back and informed her I left a documented VM on her phone 07/19/2018 about this. Patient said she is not sick but every now and then gets a "ticle" in her throat and cough at night.  Told patient that we cannot refill her cough syrup for this. She can try over the counter Robitussin for her cough or allergy medication once daily as this could be due to pollen increase.  She said " Dr Army Melia has refilled this for me before but ok."   Told pt to call us if symptoms get worse and we may need to OV or telephone. She verbalized understanding of this.

## 2018-08-07 ENCOUNTER — Telehealth: Payer: Self-pay | Admitting: Endocrinology

## 2018-08-08 ENCOUNTER — Other Ambulatory Visit: Payer: Self-pay

## 2018-08-08 MED ORDER — V-GO 20 KIT: PACK | 0 refills | Status: DC

## 2018-08-08 NOTE — Telephone Encounter (Signed)
Rx has been sent  

## 2018-08-08 NOTE — Telephone Encounter (Addendum)
Pt is calling to check on refill.  Pt states that she will be out tomorrow night.  Pt states that she called her pharmacy 3 days ago for the refill. I advised that the refill request just came through on 08/07/18. Pt wanting to know if we have any samples to supply her until she is able to get his refilled  Please advise.

## 2018-09-17 ENCOUNTER — Ambulatory Visit: Payer: PPO | Admitting: Endocrinology

## 2018-10-14 ENCOUNTER — Other Ambulatory Visit: Payer: Self-pay | Admitting: Endocrinology

## 2018-10-14 ENCOUNTER — Telehealth: Payer: Self-pay | Admitting: Endocrinology

## 2018-10-14 DIAGNOSIS — E1165 Type 2 diabetes mellitus with hyperglycemia: Secondary | ICD-10-CM

## 2018-10-14 DIAGNOSIS — Z794 Long term (current) use of insulin: Secondary | ICD-10-CM

## 2018-10-14 NOTE — Telephone Encounter (Signed)
Message left on machine of times I am available tomorrow to see her.  Telephone number given to call me back to confirm.

## 2018-10-14 NOTE — Telephone Encounter (Signed)
Appointment schedule for 9AM tomorrow

## 2018-10-14 NOTE — Telephone Encounter (Signed)
Patient is requesting to see Melinda Ford tomorrow to train her on the kit that she has had for several months FreeStyle [employer]. She does not want to waste a trip out her if Melinda Ford is not available to train her.  Please Advise, Thanks

## 2018-10-15 ENCOUNTER — Telehealth: Payer: Self-pay | Admitting: Endocrinology

## 2018-10-15 ENCOUNTER — Encounter: Payer: PPO | Attending: Endocrinology | Admitting: Nutrition

## 2018-10-15 ENCOUNTER — Ambulatory Visit: Payer: PPO | Admitting: Endocrinology

## 2018-10-15 ENCOUNTER — Other Ambulatory Visit: Payer: Self-pay

## 2018-10-15 ENCOUNTER — Encounter: Payer: Self-pay | Admitting: Endocrinology

## 2018-10-15 VITALS — BP 122/68 | HR 65 | Ht 62.0 in | Wt 150.8 lb

## 2018-10-15 DIAGNOSIS — Z794 Long term (current) use of insulin: Secondary | ICD-10-CM

## 2018-10-15 DIAGNOSIS — E7800 Pure hypercholesterolemia, unspecified: Secondary | ICD-10-CM | POA: Diagnosis not present

## 2018-10-15 DIAGNOSIS — E1165 Type 2 diabetes mellitus with hyperglycemia: Secondary | ICD-10-CM | POA: Diagnosis not present

## 2018-10-15 DIAGNOSIS — E1140 Type 2 diabetes mellitus with diabetic neuropathy, unspecified: Secondary | ICD-10-CM | POA: Diagnosis not present

## 2018-10-15 LAB — BASIC METABOLIC PANEL
BUN: 21 mg/dL (ref 6–23)
CO2: 29 mEq/L (ref 19–32)
Calcium: 10 mg/dL (ref 8.4–10.5)
Chloride: 105 mEq/L (ref 96–112)
Creatinine, Ser: 0.66 mg/dL (ref 0.40–1.20)
GFR: 105.91 mL/min (ref >60.00)
Glucose, Bld: 98 mg/dL (ref 70–99)
Potassium: 4.1 mEq/L (ref 3.5–5.1)
Sodium: 140 mEq/L (ref 135–145)

## 2018-10-15 LAB — MICROALBUMIN / CREATININE URINE RATIO
Creatinine,U: 103.6 mg/dL
Microalb Creat Ratio: 0.8 mg/g (ref 0.0–30.0)
Microalb, Ur: 0.9 mg/dL (ref 0.0–1.9)

## 2018-10-15 LAB — POCT GLYCOSYLATED HEMOGLOBIN (HGB A1C): Hemoglobin A1C: 6.9 % — AB (ref 4.0–5.6)

## 2018-10-15 MED ORDER — FREESTYLE LIBRE 14 DAY SENSOR MISC: 1.0000 [IU] | 4 refills | Status: DC

## 2018-10-15 NOTE — Progress Notes (Signed)
Patient ID: Melinda Ford, female   DOB: Aug 25, 1945, 73 y.o.   MRN: [id number]            Reason for Appointment: Follow-up for Type 2 Diabetes   History of Present Illness:          Date of diagnosis of type 2 diabetes mellitus: ?  2004        Background history:   She previously had been on metformin and also Avandia initially Detailed records of her care are not available prior to 2013 or so She was tried on Januvia in 2016 but this was not effective and this was stopped in 5/70 She was started on insulin in 2/16 with small doses of Lantus insulin Her A1c has been consistently over 8% since about 2013  Recent history:   INSULIN regimen is:   V-go 20 unit pump since 0/86/57, boluses 2 clicks with meals     Non-insulin hypoglycemic drugs the patient is taking are: metformin ER 500 mg bid, Invokana 150   Her A1c in June was about the same at  7% and now is 6.9, previously 6.7  Current management, blood sugar patterns and problems identified:  She has been trained in doing the [employer] today even though she has been advised to start using this several months ago  Therefore blood sugars are fluctuating throughout the day with periodic high readings after meals  She is still forgetting to check her sugars after her evening meal and checks them mostly at bedtime  She has been reminded about checking readings 2 hours after dinner but she forgets  Also is not consistent with monitoring her food and snacks and sometimes will have ice cream or other high-fat snacks late at night  However she will not consistently do boluses for snacks and may rarely forget them at mealtimes also  Has had only a couple of low normal readings and no hypoglycemia otherwise  She is trying to do regular walking now  Weight is down 3 pounds        Side effects from medications have been: nausea and diarrhea from 1000 MG metformin  Compliance with the medical regimen: fair   Glucose  monitoring:  done up to 3 times a day         Glucometer: One Touch Verio          Blood Glucose readings by download    68% of the readings are within the 70-180 mg target for the last month   PRE-MEAL Fasting Lunch Dinner Bedtime  30-day overall  Glucose range:  74-174  111, 201  148  124-284   Mean/median:  124    202  153+/-56   POST-MEAL PC Breakfast PC Lunch PC Dinner  Glucose range:    161  Mean/median:      Previous readings  PRE-MEAL Fasting Lunch Dinner Bedtime Overall  Glucose range:  82-199   72, 227  89-304   Mean/median:  135  117   170  135+/-56   POST-MEAL PC Breakfast PC Lunch PC Dinner  Glucose range:   91, 173   Mean/median:       \     Self-care:   Meal times are:  Breakfast is at 10 AM, dinner 8, lunch 2-3 pm  Typical meal intake: Breakfast is toast, oatmeal, sometimes bacon, milk   Lunch: Sometimes hot dogs, fish sandwich Evening meal is a protein like chicken, steak, pork chop, corn, greens.  Dietician visit, most recent: 2013 CDE visit: 12/2015               Exercise:  walks up to 20 minutes  Weight history:  Wt Readings from Last 3 Encounters:  10/15/18 150 lb 12.8 oz (68.4 kg)  06/21/18 153 lb 3.2 oz (69.5 kg)  05/17/18 153 lb (69.4 kg)    Glycemic control:   Lab Results  Component Value Date   HGBA1C 6.9 (A) 10/15/2018   HGBA1C 6.7 (A) 02/13/2018   HGBA1C 7.0 (A) 09/07/2017   Lab Results  Component Value Date   MICROALBUR 1.1 11/02/2017   LDLCALC 97 05/17/2018   CREATININE 0.75 06/21/2018   Lab Results  Component Value Date   MICRALBCREAT 1.5 11/02/2017    Other active problems: See review of systems   Allergies as of 10/15/2018      Reactions   Wheat Bran Hives   Gramineae Pollens       Medication List       Accurate as of October 15, 2018 10:34 AM. If you have any questions, ask your nurse or doctor.        acetaminophen 500 MG tablet Commonly known as: TYLENOL Take 1,000 mg by mouth 2 (two) times  daily.   amLODipine 10 MG tablet Commonly known as: NORVASC Take 1 tablet (10 mg total) by mouth daily.   canagliflozin 300 MG Tabs tablet Commonly known as: Invokana Take 1 tablet (300 mg total) by mouth daily before breakfast.   cetirizine 10 MG tablet Commonly known as: ZYRTEC Take 10 mg by mouth daily as needed.   FreeStyle Libre 14 Day Reader Kerrin Mo See admin instructions.   FreeStyle Libre 14 Day Sensor Misc 1 Units by Does not apply route every 14 (fourteen) days.   GINKGO BILOBA COMPLEX PO Take by mouth.   GLUCOSAMINE HCL PO Take by mouth.   glucose blood test strip Commonly known as: OneTouch Verio Use to test blood sugar 4 times daily   losartan 100 MG tablet Commonly known as: COZAAR Take 1 tablet (100 mg total) by mouth daily.   meloxicam 15 MG tablet Commonly known as: MOBIC TAKE 1 TABLET BY MOUTH ONCE DAILY AS NEEDED FOR PAIN (SEVERE PAIN)   metFORMIN 500 MG 24 hr tablet Commonly known as: GLUCOPHAGE-XR Take 1 tablet (500 mg total) by mouth 2 (two) times daily with a meal.   NON FORMULARY CPAP @@ 9 cm H2O nightly   NovoLIN R ReliOn 100 units/mL injection Generic drug: insulin regular 75 UNITS PER THE V-GO PUMP   OneTouch Verio w/Device Kit 1 each by Does not apply route 4 (four) times daily.   pravastatin 20 MG tablet Commonly known as: PRAVACHOL Take 1 tablet (20 mg total) by mouth daily.   ReliOn Ultra Thin Lancets Misc Inject 1 Units as directed 3 (three) times daily as needed.   V-Go 20 Kit USE 1 PER DAY   Vitamin D 50 MCG (2000 UT) Caps Take 1 capsule (2,000 Units total) by mouth daily.       Allergies:  Allergies  Allergen Reactions  . Wheat Bran Hives  . Gramineae Pollens     Past Medical History:  Diagnosis Date  . Allergy   . Arthritis   . Cataract   . Diabetes mellitus without complication ([place])   . Hyperlipidemia   . Hypertension   . OSA (obstructive sleep apnea) 08/23/2016  . Osteoporosis   . Sleep apnea      Past Surgical  History:  Procedure Laterality Date  . ABDOMINAL HYSTERECTOMY      Family History  Problem Relation Age of Onset  . Breast cancer Other 40  . Alcohol abuse Father   . Mental illness Father   . Stroke Father   . Diabetes Sister   . Asthma Brother   . Diabetes Brother   . Diabetes Maternal Aunt   . Heart disease Maternal Aunt   . Diabetes Maternal Uncle   . Early death Maternal Uncle   . Breast cancer Cousin 32       mat cousin    Social History:  reports that she has never smoked. She has never used smokeless tobacco. She reports that she does not drink alcohol or use drugs.   Review of Systems  Lipid history: She is  taking pravastatin as prescribed using 20 mg   She thinks she may have side effects of muscle cramps which do not completely go away however when she tried to stop this LDL previously below 100 Also followed by PCP   Lab Results  Component Value Date   CHOL 196 05/17/2018   HDL 90 05/17/2018   LDLCALC 97 05/17/2018   TRIG 47 05/17/2018   CHOLHDL 2.2 05/17/2018           Hypertension: on treatment for several years, on amlodipine and losartan, Blood pressure recently better controlled  Has been followed by PCP  BP Readings from Last 3 Encounters:  10/15/18 122/68  06/21/18 (!) 144/70  05/17/18 112/62   She takes meloxicam 15 mg for joint pains, usually 2-3/7 days otherwise taking Tylenol   Most recent foot exam: 2/20  LABS:  Office Visit on 10/15/2018  Component Date Value Ref Range Status  . Hemoglobin A1C 10/15/2018 6.9* 4.0 - 5.6 % Final    Physical Examination:  BP 122/68 (BP Location: Left Arm, Patient Position: Sitting, Cuff Size: Normal)   Pulse 65   Ht _0  (1.575 m)   Wt 150 lb 12.8 oz (68.4 kg)   SpO2 96%   BMI 27.58 kg/m       ASSESSMENT:  Diabetes type 2 on insulin  See history of present illness for detailed discussion of current diabetes management, blood sugar patterns and problems identified   She is being treated with an insulin pump/V-go, Invokana and metformin  A1c is slightly higher at 6.9  This is likely from postprandial hyperglycemia She is just starting to use the freestyle libre and this will help her with identifying when her blood sugars are going up Also hopefully this will improve her ability to bolus appropriately for meals and snacks Again discussed that most of her snacks at night may be raising her blood sugars and she may need to bolus at least 1 click and at the most 3 clicks for large snacks like ice cream Currently fasting readings are adequate with 20 unit basal on her pump She is also starting to walk    LIPIDS: LDL 112 before starting pravastatin and last 97 She thinks she has muscle cramps from pravastatin although this is unlikely   PLAN:   She was instructed by the nurse educator regarding the freestyle libre and reminded her that she needs to check the sugar at least 3 times a day to get adequate data She will start bolusing consistently as discussed above Continue to change the pump at the same time daily Continue regular walking for exercise  She will stay on Invokana Since her blood sugars  are reasonably controlled when she does her boluses she can stay on the regular insulin from [employer] Does need to try and bolus 30 minutes at a time if possible  For her hyperlipidemia she can try taking pravastatin every other day for now but consider switching to Crestor 3 times a week on the next visit  Renal function: We will check this today along with microalbumin Reminded that Mobic can potentially affect renal function and prefer that she take only half a tablet at a time   There are no Patient Instructions on file for this visit.   Total visit time for evaluation and management of multiple problems and counseling =25 minutes   Elayne Snare 10/15/2018, 10:34 AM   Note: This office note was prepared with Dragon voice recognition system  technology. Any transcriptional errors that result from this process are unintentional.

## 2018-10-15 NOTE — Telephone Encounter (Signed)
I will enter her for insurance verification

## 2018-10-15 NOTE — Telephone Encounter (Signed)
Melinda Ford, Dr Dwyane Dee states that this patient will need Prolia about the same time as her next appointment and would like to this to get set up. Thanks!

## 2018-10-15 NOTE — Patient Instructions (Addendum)
Try Pravachol every 2 days  Check blood sugars on waking up days a week  Also check blood sugars about 2 hours after meals and do this after different meals by rotation  Recommended blood sugar levels on waking up are 90-130 and about 2 hours after meal is 130-160  Please bring your blood sugar monitor to each visit, thank you  Always bolus 1-3 clicks before night snacks

## 2018-10-16 NOTE — Patient Instructions (Signed)
Scan sensor at least every 8 hours Replace sensor every 14 days Read over directions Call if questions.

## 2018-10-16 NOTE — Progress Notes (Signed)
We discussed the difference between sensor readings and blood sugar readings, and when she will need to do a blood sugar reading.  She reported good understanding of this.  She wanted the readings to go to her phone, but her phone did not support this.  We set up her reader and she was instructed on how to insert and use the reader.  She did this and had no questions. She is getting a new phone next weeks and wants to come by to have me set up her phone for this.  I agreed to do this.

## 2018-10-18 NOTE — Telephone Encounter (Signed)
Patient has been entered into the portal for insurance verification 

## 2018-10-28 DIAGNOSIS — B351 Tinea unguium: Secondary | ICD-10-CM | POA: Diagnosis not present

## 2018-10-28 DIAGNOSIS — M79674 Pain in right toe(s): Secondary | ICD-10-CM | POA: Diagnosis not present

## 2018-10-28 DIAGNOSIS — M722 Plantar fascial fibromatosis: Secondary | ICD-10-CM | POA: Diagnosis not present

## 2018-10-28 DIAGNOSIS — E1142 Type 2 diabetes mellitus with diabetic polyneuropathy: Secondary | ICD-10-CM | POA: Diagnosis not present

## 2018-10-28 DIAGNOSIS — M79675 Pain in left toe(s): Secondary | ICD-10-CM | POA: Diagnosis not present

## 2018-10-30 DIAGNOSIS — H902 Conductive hearing loss, unspecified: Secondary | ICD-10-CM | POA: Diagnosis not present

## 2018-10-30 DIAGNOSIS — H6123 Impacted cerumen, bilateral: Secondary | ICD-10-CM | POA: Diagnosis not present

## 2018-10-30 DIAGNOSIS — E119 Type 2 diabetes mellitus without complications: Secondary | ICD-10-CM | POA: Diagnosis not present

## 2018-10-30 LAB — HM DIABETES EYE EXAM

## 2018-10-31 DIAGNOSIS — H25813 Combined forms of age-related cataract, bilateral: Secondary | ICD-10-CM | POA: Diagnosis not present

## 2018-11-01 DIAGNOSIS — H40013 Open angle with borderline findings, low risk, bilateral: Secondary | ICD-10-CM | POA: Diagnosis not present

## 2018-11-01 DIAGNOSIS — H25043 Posterior subcapsular polar age-related cataract, bilateral: Secondary | ICD-10-CM | POA: Diagnosis not present

## 2018-11-01 DIAGNOSIS — H2513 Age-related nuclear cataract, bilateral: Secondary | ICD-10-CM | POA: Diagnosis not present

## 2018-11-01 DIAGNOSIS — H18413 Arcus senilis, bilateral: Secondary | ICD-10-CM | POA: Diagnosis not present

## 2018-11-01 DIAGNOSIS — H2511 Age-related nuclear cataract, right eye: Secondary | ICD-10-CM | POA: Diagnosis not present

## 2018-11-01 DIAGNOSIS — H25013 Cortical age-related cataract, bilateral: Secondary | ICD-10-CM | POA: Diagnosis not present

## 2018-11-02 ENCOUNTER — Telehealth: Payer: Self-pay | Admitting: Nutrition

## 2018-11-02 NOTE — Telephone Encounter (Signed)
Patient came in with husband, because she had not been wearing her sensor, (it had fallen off), and she needed a review on how to put it back on, and start it up. She and her husband were shown how to do this, and the husband reported good understanding of this.  They had no final questions.

## 2018-11-06 DIAGNOSIS — H2511 Age-related nuclear cataract, right eye: Secondary | ICD-10-CM | POA: Diagnosis not present

## 2018-11-06 DIAGNOSIS — H18413 Arcus senilis, bilateral: Secondary | ICD-10-CM | POA: Diagnosis not present

## 2018-11-06 DIAGNOSIS — H25013 Cortical age-related cataract, bilateral: Secondary | ICD-10-CM | POA: Diagnosis not present

## 2018-11-06 DIAGNOSIS — H2513 Age-related nuclear cataract, bilateral: Secondary | ICD-10-CM | POA: Diagnosis not present

## 2018-11-06 DIAGNOSIS — H25043 Posterior subcapsular polar age-related cataract, bilateral: Secondary | ICD-10-CM | POA: Diagnosis not present

## 2018-11-06 DIAGNOSIS — H40013 Open angle with borderline findings, low risk, bilateral: Secondary | ICD-10-CM | POA: Diagnosis not present

## 2018-11-10 ENCOUNTER — Encounter: Payer: Self-pay | Admitting: Internal Medicine

## 2018-11-11 ENCOUNTER — Other Ambulatory Visit: Payer: Self-pay | Admitting: Endocrinology

## 2018-11-18 DIAGNOSIS — H2511 Age-related nuclear cataract, right eye: Secondary | ICD-10-CM | POA: Diagnosis not present

## 2018-11-19 DIAGNOSIS — H2512 Age-related nuclear cataract, left eye: Secondary | ICD-10-CM | POA: Diagnosis not present

## 2018-11-20 ENCOUNTER — Ambulatory Visit: Payer: Self-pay | Admitting: Internal Medicine

## 2018-11-27 ENCOUNTER — Ambulatory Visit (INDEPENDENT_AMBULATORY_CARE_PROVIDER_SITE_OTHER): Payer: PPO | Admitting: Internal Medicine

## 2018-11-27 ENCOUNTER — Other Ambulatory Visit: Payer: Self-pay

## 2018-11-27 ENCOUNTER — Encounter: Payer: Self-pay | Admitting: Internal Medicine

## 2018-11-27 VITALS — BP 118/72 | HR 72 | Ht 62.0 in | Wt 157.0 lb

## 2018-11-27 DIAGNOSIS — M5431 Sciatica, right side: Secondary | ICD-10-CM

## 2018-11-27 DIAGNOSIS — M722 Plantar fascial fibromatosis: Secondary | ICD-10-CM | POA: Diagnosis not present

## 2018-11-27 DIAGNOSIS — M25551 Pain in right hip: Secondary | ICD-10-CM | POA: Diagnosis not present

## 2018-11-27 NOTE — Progress Notes (Signed)
Date:  11/27/2018   Name:  Melinda Ford   DOB:  10-Jan-1946   MRN:  [id number]   Chief Complaint: Foot Pain (Left heel pain. And right hip pain. Started in the last month.)  Foot Pain This is a new problem. The current episode started 1 to 4 weeks ago. The problem occurs daily. The problem has been unchanged. Pertinent negatives include no abdominal pain, arthralgias, chest pain, chills, coughing, fatigue, fever, headaches or myalgias.   Hip pain - pt is convinced that she has hip OA on the right but review shows a normal Xray in 2017 but lumbar DDD and a dx of right sided sciatica and radiculopathy.  She has seen [facility] and Dr. Sharlet Salina.  At one time she was on oxycodone for her back pain.  She has discontinued that and is only taking tylenol.   She believes that she had an MRI of her hip at [facility] and was told that there was arthritis.    Review of Systems  Constitutional: Negative for chills, fatigue and fever.  Respiratory: Negative for cough, chest tightness, shortness of breath and wheezing.   Cardiovascular: Negative for chest pain and palpitations.  Gastrointestinal: Negative for abdominal pain.  Musculoskeletal: Negative for arthralgias and myalgias.  Neurological: Negative for dizziness, light-headedness and headaches.  Psychiatric/Behavioral: Negative for dysphoric mood and sleep disturbance. The patient is not nervous/anxious.     Patient Active Problem List   Diagnosis Date Noted  . Hypercholesterolemia 05/17/2018  . 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 Chronic midline thoracic back pain 02/22/2018  . Right lateral epicondylitis 06/04/2017  . Tinea versicolor 09/20/2016  . OSA (obstructive sleep apnea) 08/23/2016  . Allergic rhinitis 07/13/2016  . Radiculopathy of lumbosacral region 06/22/2016  . Right sided sciatica 03/09/2016  . Overweight (BMI 25.0-29.9) 10/01/2015  . Vitamin D deficiency 09/18/2014  .  Onychomycosis 09/16/2014  . Degenerative disc disease, lumbar 09/16/2014  . Benign essential HTN 10/10/2010  . Osteoporosis 10/10/2010  . Type 2 diabetes, controlled, with neuropathy ([place]) 10/10/2010    Allergies  Allergen Reactions  . Wheat Bran Hives  . Gramineae Pollens     Past Surgical History:  Procedure Laterality Date  . ABDOMINAL HYSTERECTOMY    . CATARACT EXTRACTION Right 2020    Social History   Tobacco Use  . Smoking status: Never Smoker  . Smokeless tobacco: Never Used  Substance Use Topics  . Alcohol use: No    Alcohol/week: 1.0 standard drinks    Types: 1 Standard drinks or equivalent per week  . Drug use: No     Medication list has been reviewed and updated.  Current Meds  Medication Sig  . acetaminophen (TYLENOL) 500 MG tablet Take 1,000 mg by mouth 2 (two) times daily.  Marland Kitchen amLODipine (NORVASC) 10 MG tablet Take 1 tablet (10 mg total) by mouth daily.  . Blood Glucose Monitoring Suppl (ONETOUCH VERIO) w/Device KIT 1 each by Does not apply route 4 (four) times daily.  . canagliflozin (INVOKANA) 300 MG TABS tablet Take 1 tablet (300 mg total) by mouth daily before breakfast.  . Cholecalciferol (VITAMIN D) 2000 UNITS CAPS Take 1 capsule (2,000 Units total) by mouth daily.  . Continuous Blood Gluc Receiver (FREESTYLE LIBRE 14 DAY READER) DEVI See admin instructions.  . Continuous Blood Gluc Sensor (FREESTYLE LIBRE 14 DAY SENSOR) MISC 1 Units by Does not apply route every 14 (fourteen) days.  Marland Kitchen GINKGO BILOBA COMPLEX PO Take by  mouth.  . glucose blood (ONETOUCH VERIO) test strip Use to test blood sugar 4 times daily  . Insulin Disposable Pump (V-GO 20) KIT USE 1 PER DAY  . losartan (COZAAR) 100 MG tablet Take 1 tablet (100 mg total) by mouth daily.  . meloxicam (MOBIC) 15 MG tablet TAKE 1 TABLET BY MOUTH ONCE DAILY AS NEEDED FOR PAIN (SEVERE PAIN)  . metFORMIN (GLUCOPHAGE-XR) 500 MG 24 hr tablet Take 1 tablet (500 mg total) by mouth 2 (two) times daily with a  meal.  . NON FORMULARY CPAP @@ 9 cm H2O nightly  . NOVOLIN R RELION 100 UNIT/ML injection 75 UNITS PER THE V-GO PUMP  . pravastatin (PRAVACHOL) 20 MG tablet Take 1 tablet (20 mg total) by mouth daily. (Patient taking differently: Take by mouth every 3 (three) days. )  . ReliOn Ultra Thin Lancets MISC Inject 1 Units as directed 3 (three) times daily as needed.    PHQ 2/9 Scores 11/27/2018 05/15/2018 03/21/2017 11/20/2016  PHQ - 2 Score 1 1 0 0  PHQ- 9 Score - 5 0 -    BP Readings from Last 3 Encounters:  11/27/18 118/72  10/15/18 122/68  06/21/18 (!) 144/70    Physical Exam Vitals signs and nursing note reviewed.  Constitutional:      General: She is not in acute distress.    Appearance: Normal appearance. She is well-developed.  HENT:     Head: Normocephalic and atraumatic.  Neck:     Musculoskeletal: Normal range of motion.  Cardiovascular:     Rate and Rhythm: Normal rate and regular rhythm.     Pulses: Normal pulses.     Heart sounds: No murmur.  Pulmonary:     Effort: Pulmonary effort is normal. No respiratory distress.     Breath sounds: No wheezing or rhonchi.  Musculoskeletal: Normal range of motion.     Right hip: She exhibits normal range of motion, normal strength and no tenderness.     Left hip: She exhibits normal range of motion, normal strength and no tenderness.     Left ankle: Achilles tendon normal.     Comments: Tender on left heel at fascia insertion.  Skin:    General: Skin is warm and dry.     Findings: No rash.  Neurological:     Mental Status: She is alert and oriented to person, place, and time.  Psychiatric:        Behavior: Behavior normal.        Thought Content: Thought content normal.     Wt Readings from Last 3 Encounters:  11/27/18 157 lb (71.2 kg)  10/15/18 150 lb 12.8 oz (68.4 kg)  06/21/18 153 lb 3.2 oz (69.5 kg)    BP 118/72   Pulse 72   Ht _0  (1.575 m)   Wt 157 lb (71.2 kg)   SpO2 97%   BMI 28.72 kg/m   Assessment and  Plan: 1. Plantar fasciitis of left foot Symptoms are moderate at this time and not limiting activity to a great degree Continue tylenol Use ice to massage three times per day for 10 minutes Stretch three times a day  2. Right sided sciatica Previously treated for back pain and sciatica by pain management Now only on tylenol as needed Pt urged to resume exercise program for core strengthening  3. Pain of right hip joint No abnormal findings on exam Will request MRI and records from [facility]   Partially dictated using [employer]. Any  errors are unintentional.  Halina Maidens, MD [facility]  11/27/2018

## 2018-11-27 NOTE — Patient Instructions (Signed)
Calcium 1000-1200 mg per day

## 2018-11-28 ENCOUNTER — Other Ambulatory Visit: Payer: Self-pay

## 2018-11-28 MED ORDER — METFORMIN HCL ER 500 MG PO TB24: 500.0000 mg | ORAL_TABLET | Freq: Two times a day (BID) | ORAL | 1 refills | Status: DC

## 2018-11-29 ENCOUNTER — Other Ambulatory Visit: Payer: Self-pay

## 2018-11-29 DIAGNOSIS — I10 Essential (primary) hypertension: Secondary | ICD-10-CM

## 2018-11-29 MED ORDER — LOSARTAN POTASSIUM 100 MG PO TABS: 100.0000 mg | ORAL_TABLET | Freq: Every day | ORAL | 1 refills | Status: DC

## 2018-12-12 ENCOUNTER — Encounter: Payer: Self-pay | Admitting: Internal Medicine

## 2019-01-03 ENCOUNTER — Other Ambulatory Visit: Payer: Self-pay | Admitting: Endocrinology

## 2019-01-03 ENCOUNTER — Other Ambulatory Visit: Payer: Self-pay | Admitting: Family Medicine

## 2019-01-03 MED ORDER — V-GO 20 KIT: PACK | 0 refills | Status: DC

## 2019-01-05 NOTE — Telephone Encounter (Signed)
Thank You.

## 2019-01-16 ENCOUNTER — Ambulatory Visit: Payer: PPO | Admitting: Endocrinology

## 2019-02-05 DIAGNOSIS — E1142 Type 2 diabetes mellitus with diabetic polyneuropathy: Secondary | ICD-10-CM | POA: Diagnosis not present

## 2019-02-05 DIAGNOSIS — M79674 Pain in right toe(s): Secondary | ICD-10-CM | POA: Diagnosis not present

## 2019-02-05 DIAGNOSIS — B351 Tinea unguium: Secondary | ICD-10-CM | POA: Diagnosis not present

## 2019-02-05 DIAGNOSIS — B353 Tinea pedis: Secondary | ICD-10-CM | POA: Diagnosis not present

## 2019-02-05 DIAGNOSIS — M79675 Pain in left toe(s): Secondary | ICD-10-CM | POA: Diagnosis not present

## 2019-02-11 ENCOUNTER — Encounter: Payer: Self-pay | Admitting: Endocrinology

## 2019-02-11 ENCOUNTER — Other Ambulatory Visit: Payer: Self-pay

## 2019-02-11 ENCOUNTER — Ambulatory Visit (INDEPENDENT_AMBULATORY_CARE_PROVIDER_SITE_OTHER): Payer: PPO | Admitting: Endocrinology

## 2019-02-11 VITALS — BP 118/68 | HR 67 | Ht 62.0 in | Wt 159.4 lb

## 2019-02-11 DIAGNOSIS — E1165 Type 2 diabetes mellitus with hyperglycemia: Secondary | ICD-10-CM

## 2019-02-11 DIAGNOSIS — I10 Essential (primary) hypertension: Secondary | ICD-10-CM

## 2019-02-11 DIAGNOSIS — Z794 Long term (current) use of insulin: Secondary | ICD-10-CM | POA: Diagnosis not present

## 2019-02-11 DIAGNOSIS — E7800 Pure hypercholesterolemia, unspecified: Secondary | ICD-10-CM

## 2019-02-11 LAB — COMPREHENSIVE METABOLIC PANEL
ALT: 19 U/L (ref 0–35)
AST: 20 U/L (ref 0–37)
Albumin: 4.1 g/dL (ref 3.5–5.2)
Alkaline Phosphatase: 66 U/L (ref 39–117)
BUN: 20 mg/dL (ref 6–23)
CO2: 26 mEq/L (ref 19–32)
Calcium: 9.5 mg/dL (ref 8.4–10.5)
Chloride: 105 mEq/L (ref 96–112)
Creatinine, Ser: 0.57 mg/dL (ref 0.40–1.20)
GFR: 125.32 mL/min (ref >60.00)
Glucose, Bld: 162 mg/dL — ABNORMAL HIGH (ref 70–99)
Potassium: 4 mEq/L (ref 3.5–5.1)
Sodium: 137 mEq/L (ref 135–145)
Total Bilirubin: 0.4 mg/dL (ref 0.2–1.2)
Total Protein: 6.9 g/dL (ref 6.0–8.3)

## 2019-02-11 LAB — LIPID PANEL
Cholesterol: 199 mg/dL (ref 0–200)
HDL: 77.8 mg/dL (ref >39.00)
LDL Cholesterol: 109 mg/dL — ABNORMAL HIGH (ref 0–99)
NonHDL: 121.15
Total CHOL/HDL Ratio: 3
Triglycerides: 63 mg/dL (ref 0.0–149.0)
VLDL: 12.6 mg/dL (ref 0.0–40.0)

## 2019-02-11 LAB — POCT GLYCOSYLATED HEMOGLOBIN (HGB A1C): Hemoglobin A1C: 7 % — AB (ref 4.0–5.6)

## 2019-02-11 MED ORDER — FREESTYLE LIBRE 2 SENSOR SYSTM MISC: 2.0000 | 3 refills | Status: DC

## 2019-02-11 NOTE — Patient Instructions (Signed)
Check sugar 4x daily with Elenor Legato

## 2019-02-11 NOTE — Progress Notes (Signed)
Patient ID: Melinda Ford, female   DOB: [birth year], 73 y.o.   MRN: [id number]            Reason for Appointment: Follow-up for Type 2 Diabetes   History of Present Illness:          Date of diagnosis of type 2 diabetes mellitus: ?  2004        Background history:   She previously had been on metformin and also Avandia initially Detailed records of her care are not available prior to 2013 or so She was tried on Januvia in 2016 but this was not effective and this was stopped in 5/70 She was started on insulin in 2/16 with small doses of Lantus insulin Her A1c has been consistently over 8% since about 2013  Recent history:   INSULIN regimen is:   V-go 20 unit pump since 05/26/95, boluses 2 clicks with meals     Non-insulin hypoglycemic drugs the patient is taking are: metformin ER 500 mg bid, Invokana 150   Her A1c is 7% compared to 6.9   Current management, blood sugar patterns and problems identified:  She has been using the freestyle libre system but this appears to be reading falsely low  Also she is using it only 45% of the time  Recently has checked her readings with fingerstick only infrequently  Although her blood sugars in the past have been mostly high at bedtime after snacking they do not appear to be as consistently high now  She is only bolusing 1 click at breakfast time instead of the usual 2 and blood sugars on average are highest after breakfast on her freestyle libre  Currently still using the [employer] brand regular insulin  She has difficulty keeping her pump sticking on her arm and has not started using her abdomen  She still does not understand that when her V-go pump needs to be changed at 24 hours she does not need to keep it on despite having any bolus insulin leftover Likely still has readings that are going up after her evening snacks although she is cutting back on bread and ice cream       Side effects from medications have been: nausea and  diarrhea from 1000 MG metformin  Compliance with the medical regimen: fair   Glucose monitoring:  done up to 3 times a day         Glucometer: One Touch Verio          Blood Glucose readings by download of the meter   Mornings: 78-110 After lunch 181 Bedtime 199, 298  Averages from freestyle libre:  PRE-MEAL Fasting Lunch Dinner Bedtime Overall  Glucose range:       Mean/median:  65  128  86  126  103   POST-MEAL PC Breakfast PC Lunch PC Dinner  Glucose range:     Mean/median:  134  132  122    Previous blood sugar readings from meter:  PRE-MEAL Fasting Lunch Dinner Bedtime  30-day overall  Glucose range:  74-174  111, 201  148  124-284   Mean/median:  124    202  153+/-56   POST-MEAL PC Breakfast PC Lunch PC Dinner  Glucose range:    161  Mean/median:           Self-care:   Meal times are:  Breakfast is at 10 AM, dinner 8, lunch 2-3 pm  Typical meal intake: Breakfast is toast, oatmeal, sometimes bacon, milk  Lunch: Sometimes hot dogs, fish sandwich Evening meal is a protein like chicken, steak, pork chop, corn, greens.           Dietician visit, most recent: 2013 CDE visit: 12/2015               Exercise:  walks up to 20 minutes almost daily  Weight history:  Wt Readings from Last 3 Encounters:  02/11/19 159 lb 6.4 oz (72.3 kg)  11/27/18 157 lb (71.2 kg)  10/15/18 150 lb 12.8 oz (68.4 kg)    Glycemic control:   Lab Results  Component Value Date   HGBA1C 7.0 (A) 02/11/2019   HGBA1C 6.9 (A) 10/15/2018   HGBA1C 6.7 (A) 02/13/2018   Lab Results  Component Value Date   MICROALBUR 0.9 10/15/2018   LDLCALC 97 05/17/2018   CREATININE 0.66 10/15/2018   Lab Results  Component Value Date   MICRALBCREAT 0.8 10/15/2018    Other active problems: See review of systems   Allergies as of 02/11/2019      Reactions   Wheat Bran Hives   Gramineae Pollens       Medication List       Accurate as of February 11, 2019  9:39 AM. If you have any  questions, ask your nurse or doctor.        acetaminophen 500 MG tablet Commonly known as: TYLENOL Take 1,000 mg by mouth 2 (two) times daily.   amLODipine 10 MG tablet Commonly known as: NORVASC Take 1 tablet (10 mg total) by mouth daily.   canagliflozin 300 MG Tabs tablet Commonly known as: Invokana Take 1 tablet (300 mg total) by mouth daily before breakfast.   FreeStyle Libre 14 Day Reader Kerrin Mo See admin instructions.   FreeStyle Libre 14 Day Sensor Misc 1 Units by Does not apply route every 14 (fourteen) days.   GINKGO BILOBA COMPLEX PO Take by mouth.   glucose blood test strip Commonly known as: OneTouch Verio Use to test blood sugar 4 times daily   losartan 100 MG tablet Commonly known as: COZAAR Take 1 tablet (100 mg total) by mouth daily.   meloxicam 15 MG tablet Commonly known as: MOBIC TAKE 1 TABLET BY MOUTH ONCE DAILY AS NEEDED FOR PAIN (SEVERE PAIN)   metFORMIN 500 MG 24 hr tablet Commonly known as: GLUCOPHAGE-XR Take 1 tablet (500 mg total) by mouth 2 (two) times daily with a meal.   NON FORMULARY CPAP @@ 9 cm H2O nightly   NovoLIN R ReliOn 100 units/mL injection Generic drug: insulin regular 75 UNITS PER THE V-GO PUMP   OneTouch Verio w/Device Kit 1 each by Does not apply route 4 (four) times daily.   pravastatin 20 MG tablet Commonly known as: PRAVACHOL Take 1 tablet (20 mg total) by mouth daily. What changed:   how much to take  when to take this   ReliOn Ultra Thin Lancets Misc Inject 1 Units as directed 3 (three) times daily as needed.   V-Go 20 Kit Use 1 per day   V-Go 20 Kit USE 1 ONCE DAILY   Vitamin D 50 MCG (2000 UT) Caps Take 1 capsule (2,000 Units total) by mouth daily.       Allergies:  Allergies  Allergen Reactions   Wheat Bran Hives   Gramineae Pollens     Past Medical History:  Diagnosis Date   Allergy    Arthritis    Cataract    Diabetes mellitus without complication (HCC)    Hyperlipidemia  Hypertension    OSA (obstructive sleep apnea) 08/23/2016   Osteoporosis    Sleep apnea     Past Surgical History:  Procedure Laterality Date   ABDOMINAL HYSTERECTOMY     CATARACT EXTRACTION Right 2020    Family History  Problem Relation Age of Onset   Breast cancer Other 65   Alcohol abuse Father    Mental illness Father    Stroke Father    Diabetes Sister    Asthma Brother    Diabetes Brother    Diabetes Maternal Aunt    Heart disease Maternal Aunt    Diabetes Maternal Uncle    Early death Maternal Uncle    Breast cancer Cousin 69       mat cousin    Social History:  reports that she has never smoked. She has never used smokeless tobacco. She reports that she does not drink alcohol or use drugs.   Review of Systems  Lipid history: She is  taking pravastatin as prescribed using 20 mg   She has been recommended at least trying this every other day since she thinks it causes leg cramps  LDL previously below 100 Also followed by PCP   Lab Results  Component Value Date   CHOL 196 05/17/2018   HDL 90 05/17/2018   LDLCALC 97 05/17/2018   TRIG 47 05/17/2018   CHOLHDL 2.2 05/17/2018           Hypertension: on treatment for several years, on amlodipine and losartan   Has been followed by PCP  BP Readings from Last 3 Encounters:  02/11/19 118/68  11/27/18 118/72  10/15/18 122/68   She takes meloxicam 15 mg for joint pains 2 or 3 times a week   Most recent foot exam: 2/20  LABS:  Office Visit on 02/11/2019  Component Date Value Ref Range Status   Hemoglobin A1C 02/11/2019 7.0* 4.0 - 5.6 % Final    Physical Examination:  BP 118/68 (BP Location: Left Arm, Patient Position: Sitting, Cuff Size: Normal)    Pulse 67    Ht _0  (1.575 m)    Wt 159 lb 6.4 oz (72.3 kg)    SpO2 97%    BMI 29.15 kg/m       ASSESSMENT:  Diabetes type 2 on insulin  See history of present illness for detailed discussion of current diabetes management,  blood sugar patterns and problems identified  She is being treated with an insulin pump/V-go, Invokana and metformin  A1c is slightly higher at 7, previously 6.9  Although she is using the freestyle libre she is not getting accurate data from this and it is reading falsely low Also she is not checking her blood sugars enough and only 45% of her data is available on the download for the last 2 weeks  She is generally trying to do better with her diet with carbohydrate snacks but still erratically has blood sugars as high as 298 at night   Hyperlipidemia: Needs follow-up, on pravastatin   PLAN:    She will be instructed by the nurse educator to try to use her smart phone for her freestyle libre  Also given her a sample of the freestyle libre 2 to replace her freestyle Elenor Legato that she is using  She needs to check sugars at least every 6-8 hours to keep detailed information to be downloaded on the next visit  Explained to her that she can use her abdomen and discussed where to apply the pump  She would not keep her pump on when she finishes 24 hours of basal delivery and change it  Discussed that since her sugars are higher after breakfast she will need to bolus 2 clicks regardless of premeal blood sugar  Check more readings at bedtime after snacks and make sure she boluses adequately for any larger snacks  Discussed blood sugar targets at various times  No change in blood pressure medications  Check renal function with her being on Invokana   Recheck lipids   There are no Patient Instructions on file for this visit.   Counseling time on subjects discussed in assessment and plan sections is over 50% of today's 25 minute visit    Elayne Snare 02/11/2019, 9:39 AM   Note: This office note was prepared with Dragon voice recognition system technology. Any transcriptional errors that result from this process are unintentional.

## 2019-03-26 ENCOUNTER — Telehealth: Payer: Self-pay

## 2019-03-26 NOTE — Telephone Encounter (Signed)
Tried to contact patient no answer and vm was left for patient to callback

## 2019-05-01 ENCOUNTER — Other Ambulatory Visit: Payer: Self-pay | Admitting: Internal Medicine

## 2019-05-01 DIAGNOSIS — Z1231 Encounter for screening mammogram for malignant neoplasm of breast: Secondary | ICD-10-CM

## 2019-05-02 DIAGNOSIS — H903 Sensorineural hearing loss, bilateral: Secondary | ICD-10-CM | POA: Diagnosis not present

## 2019-05-02 DIAGNOSIS — H9041 Sensorineural hearing loss, unilateral, right ear, with unrestricted hearing on the contralateral side: Secondary | ICD-10-CM | POA: Diagnosis not present

## 2019-05-02 DIAGNOSIS — R42 Dizziness and giddiness: Secondary | ICD-10-CM | POA: Diagnosis not present

## 2019-05-02 DIAGNOSIS — H6123 Impacted cerumen, bilateral: Secondary | ICD-10-CM | POA: Diagnosis not present

## 2019-05-14 ENCOUNTER — Ambulatory Visit: Payer: PPO | Admitting: Endocrinology

## 2019-05-16 ENCOUNTER — Other Ambulatory Visit: Payer: Self-pay

## 2019-05-16 ENCOUNTER — Other Ambulatory Visit: Payer: Self-pay | Admitting: Family Medicine

## 2019-05-16 DIAGNOSIS — I10 Essential (primary) hypertension: Secondary | ICD-10-CM

## 2019-05-21 ENCOUNTER — Encounter: Payer: Self-pay | Admitting: Internal Medicine

## 2019-05-26 ENCOUNTER — Inpatient Hospital Stay: Admission: RE | Admit: 2019-05-26 | Payer: PPO | Source: Ambulatory Visit

## 2019-05-27 ENCOUNTER — Telehealth: Payer: Self-pay

## 2019-05-27 NOTE — Telephone Encounter (Signed)
Spoke to the patient today and made her aware that we will need new insurance information in order to verify her in portal-patient reported her insurance will change back to [employer] on March 1st so I will call her back than to get new ID

## 2019-06-01 ENCOUNTER — Other Ambulatory Visit: Payer: Self-pay | Admitting: Endocrinology

## 2019-06-04 ENCOUNTER — Ambulatory Visit (INDEPENDENT_AMBULATORY_CARE_PROVIDER_SITE_OTHER): Payer: PPO | Admitting: Internal Medicine

## 2019-06-04 ENCOUNTER — Other Ambulatory Visit: Payer: Self-pay

## 2019-06-04 ENCOUNTER — Encounter: Payer: Self-pay | Admitting: Internal Medicine

## 2019-06-04 VITALS — BP 136/78 | HR 68 | Temp 98.0°F | Ht 62.0 in | Wt 153.0 lb

## 2019-06-04 DIAGNOSIS — M25561 Pain in right knee: Secondary | ICD-10-CM | POA: Diagnosis not present

## 2019-06-04 DIAGNOSIS — E118 Type 2 diabetes mellitus with unspecified complications: Secondary | ICD-10-CM | POA: Diagnosis not present

## 2019-06-04 DIAGNOSIS — I10 Essential (primary) hypertension: Secondary | ICD-10-CM

## 2019-06-04 DIAGNOSIS — R1013 Epigastric pain: Secondary | ICD-10-CM

## 2019-06-04 MED ORDER — OMEPRAZOLE 20 MG PO CPDR: 20.0000 mg | DELAYED_RELEASE_CAPSULE | Freq: Every day | ORAL | 3 refills | Status: DC

## 2019-06-04 NOTE — Progress Notes (Signed)
Date:  06/04/2019   Name:  Melinda Ford   DOB:  [birth year]   MRN:  [id number]   Chief Complaint: Abdominal Pain (Upper mid stomach pain. Concerned she is having Ulcers again. ) and Knee Pain (Rt knee pain- started last night. Painful. And feels swollen in the inside of knee. Unsure of what is the cause.)  Abdominal Pain This is a recurrent problem. The current episode started more than 1 month ago. The problem has been gradually worsening. The pain is located in the epigastric region. The patient is experiencing no pain. The quality of the pain is a sensation of fullness. The abdominal pain does not radiate. Associated symptoms include arthralgias and constipation. Pertinent negatives include no diarrhea, fever, headaches, myalgias, nausea, vomiting or weight loss. The pain is aggravated by eating. The pain is relieved by liquids. Treatments tried: milk. The treatment provided mild relief. Her past medical history is significant for PUD.  Knee Pain  There was no injury mechanism. The pain is present in the right knee. The quality of the pain is described as aching. The pain is mild. The pain has been improving since onset. Pertinent negatives include no inability to bear weight or loss of motion. The symptoms are aggravated by palpation. She has tried acetaminophen for the symptoms. The treatment provided significant relief.  Hypertension This is a chronic problem. The problem is controlled. Pertinent negatives include no chest pain, headaches, palpitations or shortness of breath. Past treatments include angiotensin blockers and calcium channel blockers. The current treatment provides significant improvement.    Lab Results  Component Value Date   CREATININE 0.57 02/11/2019   BUN 20 02/11/2019   NA 137 02/11/2019   K 4.0 02/11/2019   CL 105 02/11/2019   CO2 26 02/11/2019   Lab Results  Component Value Date   CHOL 199 02/11/2019   HDL 77.80 02/11/2019   LDLCALC 109 (H) 02/11/2019   TRIG 63.0 02/11/2019   CHOLHDL 3 02/11/2019   Lab Results  Component Value Date   TSH 1.540 05/17/2018   Lab Results  Component Value Date   HGBA1C 7.0 (A) 02/11/2019     Review of Systems  Constitutional: Negative for appetite change, fatigue, fever, unexpected weight change and weight loss.  HENT: Negative for trouble swallowing.   Respiratory: Negative for choking, shortness of breath and wheezing.   Cardiovascular: Negative for chest pain, palpitations and leg swelling.  Gastrointestinal: Positive for abdominal pain and constipation. Negative for blood in stool, diarrhea, nausea and vomiting.  Musculoskeletal: Positive for arthralgias. Negative for joint swelling and myalgias.  Neurological: Negative for dizziness, light-headedness and headaches.  Psychiatric/Behavioral: Negative for dysphoric mood and sleep disturbance.    Patient Active Problem List   Diagnosis Date Noted  . Type II diabetes mellitus with complication ([place]) 54/65/0354  . Hypercholesterolemia 05/17/2018  . 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 Chronic midline thoracic back pain 02/22/2018  . Right lateral epicondylitis 06/04/2017  . Tinea versicolor 09/20/2016  . OSA (obstructive sleep apnea) 08/23/2016  . Allergic rhinitis 07/13/2016  . Radiculopathy of lumbosacral region 06/22/2016  . Right sided sciatica 03/09/2016  . Overweight (BMI 25.0-29.9) 10/01/2015  . Vitamin D deficiency 09/18/2014  . Onychomycosis 09/16/2014  . Degenerative disc disease, lumbar 09/16/2014  . Benign essential HTN 10/10/2010  . Osteoporosis 10/10/2010    Allergies  Allergen Reactions  . Wheat Bran Hives  . Gramineae Pollens     Past Surgical History:  Procedure Laterality Date  . ABDOMINAL HYSTERECTOMY    . CATARACT EXTRACTION Right 2020    Social History   Tobacco Use  . Smoking status: Never Smoker  . Smokeless tobacco: Never Used  Substance Use Topics  . Alcohol use: No      Alcohol/week: 1.0 standard drinks    Types: 1 Standard drinks or equivalent per week  . Drug use: No     Medication list has been reviewed and updated.  Current Meds  Medication Sig  . acetaminophen (TYLENOL) 500 MG tablet Take 1,000 mg by mouth 2 (two) times daily.  Marland Kitchen amLODipine (NORVASC) 10 MG tablet TAKE ONE TABLET BY MOUTH ONCE DAILY  . Blood Glucose Monitoring Suppl (ONETOUCH VERIO) w/Device KIT 1 each by Does not apply route 4 (four) times daily.  . canagliflozin (INVOKANA) 300 MG TABS tablet Take 1 tablet (300 mg total) by mouth daily before breakfast.  . Cholecalciferol (VITAMIN D) 2000 UNITS CAPS Take 1 capsule (2,000 Units total) by mouth daily.  . Continuous Blood Gluc Receiver (FREESTYLE LIBRE 14 DAY READER) DEVI See admin instructions.  . Continuous Blood Gluc Sensor (FREESTYLE LIBRE 14 DAY SENSOR) MISC 1 Units by Does not apply route every 14 (fourteen) days.  . Continuous Blood Gluc Sensor (FREESTYLE LIBRE 2 SENSOR) MISC APPLY 1 SENSOR TO BODY ONCE EVERY 14 DAYS  . GINKGO BILOBA COMPLEX PO Take by mouth.  Marland Kitchen glucose blood (ONETOUCH VERIO) test strip Use to test blood sugar 4 times daily  . Insulin Disposable Pump (V-GO 20) KIT Use 1 per day  . losartan (COZAAR) 100 MG tablet TAKE ONE TABLET BY MOUTH ONCE DAILY  . meloxicam (MOBIC) 15 MG tablet TAKE 1 TABLET BY MOUTH ONCE DAILY AS NEEDED FOR PAIN (SEVERE PAIN)  . metFORMIN (GLUCOPHAGE-XR) 500 MG 24 hr tablet TAKE 1 TABLET BY MOUTH TWICE DAILY WITH A MEAL  . NON FORMULARY CPAP @@ 9 cm H2O nightly  . NOVOLIN R RELION 100 UNIT/ML injection 75 UNITS PER THE V-GO PUMP  . ReliOn Ultra Thin Lancets MISC Inject 1 Units as directed 3 (three) times daily as needed.    PHQ 2/9 Scores 06/04/2019 11/27/2018 05/15/2018 03/21/2017  PHQ - 2 Score 4 1 1  0  PHQ- 9 Score 7 - 5 0    BP Readings from Last 3 Encounters:  06/04/19 136/78  02/11/19 118/68  11/27/18 118/72    Physical Exam Vitals and nursing note reviewed.   Constitutional:      General: She is not in acute distress.    Appearance: She is well-developed.  HENT:     Head: Normocephalic and atraumatic.  Cardiovascular:     Rate and Rhythm: Normal rate and regular rhythm.     Heart sounds: No murmur.  Pulmonary:     Effort: Pulmonary effort is normal. No respiratory distress.     Breath sounds: No wheezing or rhonchi.  Abdominal:     General: Abdomen is flat. Bowel sounds are normal.     Palpations: Abdomen is soft.     Tenderness: There is no abdominal tenderness. There is no guarding or rebound.  Musculoskeletal:        General: Normal range of motion.     Right knee: Effusion present. No ecchymosis. Normal range of motion. Tenderness present over the medial joint line.     Left knee: No swelling or effusion. No tenderness.  Skin:    General: Skin is warm and dry.     Findings: No rash.  Neurological:     Mental Status: She is alert and oriented to person, place, and time.  Psychiatric:        Behavior: Behavior normal.        Thought Content: Thought content normal.     Wt Readings from Last 3 Encounters:  06/04/19 153 lb (69.4 kg)  02/11/19 159 lb 6.4 oz (72.3 kg)  11/27/18 157 lb (71.2 kg)    BP 136/78   Pulse 68   Temp 98 F (36.7 C) (Oral)   Ht 5' 2"  (1.575 m)   Wt 153 lb (69.4 kg)   SpO2 98%   BMI 27.98 kg/m   Assessment and Plan: 1. Type II diabetes mellitus with complication (HCC) Followed by endocrinology - now in insulin pump  2. Benign essential HTN Clinically stable exam with well controlled BP on norvasc and losartan. Tolerating medications without side effects at this time. Pt to continue current regimen and low sodium diet; benefits of regular exercise as able discussed. - CBC with Differential/Platelet  3. Dyspepsia Will check CBC and begin trial of PPI for one month If no improvement will refer to GI - omeprazole (PRILOSEC) 20 MG capsule; Take 1 capsule (20 mg total) by mouth daily.  Dispense:  30 capsule; Refill: 3  4. Acute pain of right knee Suspect mild strain Continue tylenol as needed; also use topical rub   Partially dictated using Editor, commissioning. Any errors are unintentional.  Halina Maidens, MD [facility]  06/04/2019

## 2019-06-05 LAB — CBC WITH DIFFERENTIAL/PLATELET
Basophils Absolute: 0 10*3/uL (ref 0.0–0.2)
Basos: 1 %
EOS (ABSOLUTE): 0.1 10*3/uL (ref 0.0–0.4)
Eos: 2 %
Hematocrit: 45.1 % (ref 34.0–46.6)
Hemoglobin: 14.7 g/dL (ref 11.1–15.9)
Immature Grans (Abs): 0 10*3/uL (ref 0.0–0.1)
Immature Granulocytes: 0 %
Lymphocytes Absolute: 2.2 10*3/uL (ref 0.7–3.1)
Lymphs: 42 %
MCH: 30.1 pg (ref 26.6–33.0)
MCHC: 32.6 g/dL (ref 31.5–35.7)
MCV: 92 fL (ref 79–97)
Monocytes Absolute: 0.4 10*3/uL (ref 0.1–0.9)
Monocytes: 7 %
Neutrophils Absolute: 2.5 10*3/uL (ref 1.4–7.0)
Neutrophils: 48 %
Platelets: 293 10*3/uL (ref 150–450)
RBC: 4.88 x10E6/uL (ref 3.77–5.28)
RDW: 12.4 % (ref 11.7–15.4)
WBC: 5.2 10*3/uL (ref 3.4–10.8)

## 2019-06-09 DIAGNOSIS — M79674 Pain in right toe(s): Secondary | ICD-10-CM | POA: Diagnosis not present

## 2019-06-09 DIAGNOSIS — B353 Tinea pedis: Secondary | ICD-10-CM | POA: Diagnosis not present

## 2019-06-09 DIAGNOSIS — M79675 Pain in left toe(s): Secondary | ICD-10-CM | POA: Diagnosis not present

## 2019-06-09 DIAGNOSIS — M25372 Other instability, left ankle: Secondary | ICD-10-CM | POA: Diagnosis not present

## 2019-06-09 DIAGNOSIS — E1142 Type 2 diabetes mellitus with diabetic polyneuropathy: Secondary | ICD-10-CM | POA: Diagnosis not present

## 2019-06-09 DIAGNOSIS — B351 Tinea unguium: Secondary | ICD-10-CM | POA: Diagnosis not present

## 2019-06-17 ENCOUNTER — Ambulatory Visit: Payer: PPO | Admitting: Endocrinology

## 2019-06-17 ENCOUNTER — Other Ambulatory Visit: Payer: Self-pay

## 2019-06-17 ENCOUNTER — Encounter: Payer: Self-pay | Admitting: Endocrinology

## 2019-06-17 VITALS — BP 132/70 | HR 66 | Ht 62.0 in | Wt 152.2 lb

## 2019-06-17 DIAGNOSIS — I10 Essential (primary) hypertension: Secondary | ICD-10-CM | POA: Diagnosis not present

## 2019-06-17 DIAGNOSIS — Z794 Long term (current) use of insulin: Secondary | ICD-10-CM

## 2019-06-17 DIAGNOSIS — E7800 Pure hypercholesterolemia, unspecified: Secondary | ICD-10-CM

## 2019-06-17 DIAGNOSIS — E1165 Type 2 diabetes mellitus with hyperglycemia: Secondary | ICD-10-CM | POA: Diagnosis not present

## 2019-06-17 LAB — COMPREHENSIVE METABOLIC PANEL
ALT: 24 U/L (ref 0–35)
AST: 19 U/L (ref 0–37)
Albumin: 4 g/dL (ref 3.5–5.2)
Alkaline Phosphatase: 68 U/L (ref 39–117)
BUN: 21 mg/dL (ref 6–23)
CO2: 27 mEq/L (ref 19–32)
Calcium: 10.2 mg/dL (ref 8.4–10.5)
Chloride: 106 mEq/L (ref 96–112)
Creatinine, Ser: 0.71 mg/dL (ref 0.40–1.20)
GFR: 97.17 mL/min (ref >60.00)
Glucose, Bld: 128 mg/dL — ABNORMAL HIGH (ref 70–99)
Potassium: 4.2 mEq/L (ref 3.5–5.1)
Sodium: 138 mEq/L (ref 135–145)
Total Bilirubin: 0.5 mg/dL (ref 0.2–1.2)
Total Protein: 7.3 g/dL (ref 6.0–8.3)

## 2019-06-17 LAB — LIPID PANEL
Cholesterol: 188 mg/dL (ref 0–200)
HDL: 73.8 mg/dL (ref >39.00)
LDL Cholesterol: 105 mg/dL — ABNORMAL HIGH (ref 0–99)
NonHDL: 114.05
Total CHOL/HDL Ratio: 3
Triglycerides: 45 mg/dL (ref 0.0–149.0)
VLDL: 9 mg/dL (ref 0.0–40.0)

## 2019-06-17 LAB — MICROALBUMIN / CREATININE URINE RATIO
Creatinine,U: 59.1 mg/dL
Microalb Creat Ratio: 1.2 mg/g (ref 0.0–30.0)
Microalb, Ur: <0.7 mg/dL (ref 0.0–1.9)

## 2019-06-17 LAB — URINALYSIS, ROUTINE W REFLEX MICROSCOPIC
Bilirubin Urine: NEGATIVE
Hgb urine dipstick: NEGATIVE
Ketones, ur: NEGATIVE
Leukocytes,Ua: NEGATIVE
Nitrite: NEGATIVE
RBC / HPF: NONE SEEN (ref 0–?)
Specific Gravity, Urine: 1.02 (ref 1.000–1.030)
Total Protein, Urine: NEGATIVE
Urine Glucose: >=1000 — AB
Urobilinogen, UA: 0.2 (ref 0.0–1.0)
pH: 6 (ref 5.0–8.0)

## 2019-06-17 LAB — POCT GLYCOSYLATED HEMOGLOBIN (HGB A1C): Hemoglobin A1C: 7.7 % — AB (ref 4.0–5.6)

## 2019-06-17 MED ORDER — INSULIN REGULAR HUMAN 100 UNIT/ML IJ SOLN: INTRAMUSCULAR | 5 refills | Status: DC

## 2019-06-17 MED ORDER — INSULIN LISPRO 100 UNIT/ML ~~LOC~~ SOLN: 78.0000 [IU] | Freq: Once | SUBCUTANEOUS | 1 refills | Status: DC

## 2019-06-17 NOTE — Progress Notes (Signed)
Patient ID: Melinda Ford, female   DOB: 11-06-1945, 74 y.o.   MRN: [id number]            Reason for Appointment: Follow-up for Type 2 Diabetes   History of Present Illness:          Date of diagnosis of type 2 diabetes mellitus: ?  2004        Background history:   She previously had been on metformin and also Avandia initially Detailed records of her care are not available prior to 2013 or so She was tried on Januvia in 2016 but this was not effective and this was stopped in 5/70 She was started on insulin in 2/16 with small doses of Lantus insulin Her A1c has been consistently over 8% since about 2013  Recent history:   INSULIN regimen is:   V-go 20 unit pump since 06/25/38, boluses 2 clicks with meals     Non-insulin hypoglycemic drugs the patient is taking are: metformin ER 500 mg bid, Invokana 150   Her A1c is 7.7 % compared to 7 and 6.9   Current management, blood sugar patterns and problems identified:  She has been using the freestyle libre system version 2 but she feels that it is still reading about 20 mg lower than actual readings  Although she has used it more often than before still has some days with inadequate data  Her main difficulty is trying to bolus to cover her meals before eating  She keeps forgetting to bolus ahead of time with her regular insulin and is afraid of low blood sugars  Previously had not been able to afford the Humalog insulin  Also not bolusing more than 2 units even though sometimes she will have larger meals or more carbohydrate  She forgets to change her pump on time  Also she is afraid that if she changes the pump at night she will get low and does not understand that the basal insulin stops after 24 hours  Currently her highest blood sugars are late at night around midnight but also will have high readings after breakfast  Low sugars occurred only on one night during the night but not clear if she responded to this even though  she had the freestyle libre to alert for low sugars   Appears to have a dawn phenomenon also       Side effects from medications have been: nausea and diarrhea from 1000 MG metformin  Compliance with the medical regimen: fair   Glucose monitoring:  done up to 3 times a day         Glucometer: One Touch Verio           CONTINUOUS GLUCOSE MONITORING RECORD INTERPRETATION    Dates of Recording: 3/3 2 3/16  Sensor description: Elenor Legato version 2  Results statistics:   CGM use % of time  62  Average and SD  161, GV 36  Time in range  62%  % Time Above 180  27  % Time above 250  7  % Time Below target  4    PRE-MEAL Fasting Lunch Dinner Bedtime Overall  Glucose range:       Mean/median:  117  170  154     POST-MEAL PC Breakfast PC Lunch PC Dinner  Glucose range:     Mean/median:  178  189  214    Glycemic patterns summary: Blood sugars are above the target range on an average mid afternoon and  late evening after 10 PM Sugars are between 10 PM-2 AM Lowest blood sugars are from 6-8 AM including some readings below 70  Hyperglycemic episodes are occurring primarily late evening after about 9 PM but will sometimes also have postprandial high readings after breakfast or mid afternoon  Hypoglycemic episodes occurred only on the night of 3/10 between 2 AM-8 AM and transiently early morning twice  Overnight periods: Blood sugars are usually around 210 average at midnight and progressively come down to close to 100 average by 7 AM.  Hypoglycemia appeared to be only on one night  Preprandial periods: Blood sugars are rising before breakfast since she does not eat till 9-10 AM Lunchtime blood sugars difficult to assess as mealtimes are variable Late evening blood sugars are variable with average about 150+  Postprandial periods:   After breakfast: Blood sugars are rising variably after breakfast with only 1 or 2 significantly high readings Lunch meal is not characterized as it is  inconsistent, no consistent high readings in the afternoon except once  After dinner: Blood sugars are rising fairly significantly after 9 PM due to meals or snacks and may be as high as 350  PREVIOUS data  Mornings: 78-110 After lunch 181 Bedtime 199, 298  Averages from freestyle libre:  PRE-MEAL Fasting Lunch Dinner Bedtime Overall  Glucose range:       Mean/median:  65  128  86  126  103   POST-MEAL PC Breakfast PC Lunch PC Dinner  Glucose range:     Mean/median:  134  132  122       Self-care:   Meal times are:  Breakfast is at 10 AM, dinner 8, lunch 2-3 pm  Typical meal intake: Breakfast is toast, oatmeal, sometimes bacon, milk   Lunch: Sometimes hot dogs, fish sandwich Evening meal is a protein like chicken, steak, pork chop, corn, greens.           Dietician visit, most recent: 2013 CDE visit: 12/2015               Exercise:  walks up to 20 minutes almost daily  Weight history:  Wt Readings from Last 3 Encounters:  06/17/19 152 lb 3.2 oz (69 kg)  06/04/19 153 lb (69.4 kg)  02/11/19 159 lb 6.4 oz (72.3 kg)    Glycemic control:   Lab Results  Component Value Date   HGBA1C 7.7 (A) 06/17/2019   HGBA1C 7.0 (A) 02/11/2019   HGBA1C 6.9 (A) 10/15/2018   Lab Results  Component Value Date   MICROALBUR 0.9 10/15/2018   LDLCALC 109 (H) 02/11/2019   CREATININE 0.57 02/11/2019   Lab Results  Component Value Date   MICRALBCREAT 0.8 10/15/2018    Other active problems: See review of systems   Allergies as of 06/17/2019      Reactions   Wheat Bran Hives   Gramineae Pollens       Medication List       Accurate as of June 17, 2019 11:26 AM. If you have any questions, ask your nurse or doctor.        acetaminophen 500 MG tablet Commonly known as: TYLENOL Take 1,000 mg by mouth 2 (two) times daily.   amLODipine 10 MG tablet Commonly known as: NORVASC TAKE ONE TABLET BY MOUTH ONCE DAILY   canagliflozin 300 MG Tabs tablet Commonly known as:  Invokana Take 1 tablet (300 mg total) by mouth daily before breakfast.   FreeStyle Libre 14 Day Reader Kerrin Mo See  admin instructions.   FreeStyle Libre 14 Day Sensor Misc 1 Units by Does not apply route every 14 (fourteen) days.   FreeStyle Libre 2 Sensor Misc APPLY 1 SENSOR TO BODY ONCE EVERY 14 DAYS   GINKGO BILOBA COMPLEX PO Take by mouth.   glucose blood test strip Commonly known as: OneTouch Verio Use to test blood sugar 4 times daily   insulin lispro 100 UNIT/ML injection Commonly known as: HUMALOG Inject 0.78 mLs (78 Units total) into the skin once for 1 dose. Use with V-go Started by: Elayne Snare, MD   losartan 100 MG tablet Commonly known as: COZAAR TAKE ONE TABLET BY MOUTH ONCE DAILY   meloxicam 15 MG tablet Commonly known as: MOBIC TAKE 1 TABLET BY MOUTH ONCE DAILY AS NEEDED FOR PAIN (SEVERE PAIN)   metFORMIN 500 MG 24 hr tablet Commonly known as: GLUCOPHAGE-XR TAKE 1 TABLET BY MOUTH TWICE DAILY WITH A MEAL   NON FORMULARY CPAP @@ 9 cm H2O nightly   NovoLIN R ReliOn 100 units/mL injection Generic drug: insulin regular 75 UNITS PER THE V-GO PUMP   omeprazole 20 MG capsule Commonly known as: PRILOSEC Take 1 capsule (20 mg total) by mouth daily.   OneTouch Verio w/Device Kit 1 each by Does not apply route 4 (four) times daily.   ReliOn Ultra Thin Lancets Misc Inject 1 Units as directed 3 (three) times daily as needed.   V-Go 20 Kit Use 1 per day   Vitamin D 50 MCG (2000 UT) Caps Take 1 capsule (2,000 Units total) by mouth daily.       Allergies:  Allergies  Allergen Reactions  . Wheat Bran Hives  . Gramineae Pollens     Past Medical History:  Diagnosis Date  . Allergy   . Arthritis   . Cataract   . Diabetes mellitus without complication ([place])   . Hyperlipidemia   . Hypertension   . OSA (obstructive sleep apnea) 08/23/2016  . Osteoporosis   . Radiculopathy of lumbosacral region 06/22/2016  . Right lateral epicondylitis 06/04/2017  .  Right sided sciatica 03/09/2016  . Sleep apnea     Past Surgical History:  Procedure Laterality Date  . ABDOMINAL HYSTERECTOMY    . CATARACT EXTRACTION Right 2020    Family History  Problem Relation Age of Onset  . Breast cancer Other 40  . Alcohol abuse Father   . Mental illness Father   . Stroke Father   . Diabetes Sister   . Asthma Brother   . Diabetes Brother   . Diabetes Maternal Aunt   . Heart disease Maternal Aunt   . Diabetes Maternal Uncle   . Early death Maternal Uncle   . Breast cancer Cousin 64       mat cousin    Social History:  reports that she has never smoked. She has never used smokeless tobacco. She reports that she does not drink alcohol or use drugs.   Review of Systems  Lipid history: She is not  taking pravastatin as prescribed using 20 mg   She has been recommended at least trying this every other day since she thinks it causes leg cramps but she has not taken it at all  Last LDL was over 100   Lab Results  Component Value Date   CHOL 199 02/11/2019   HDL 77.80 02/11/2019   LDLCALC 109 (H) 02/11/2019   TRIG 63.0 02/11/2019   CHOLHDL 3 02/11/2019  Hypertension: on treatment for several years, on amlodipine and losartan   Has been followed by PCP  BP Readings from Last 3 Encounters:  06/17/19 132/70  06/04/19 136/78  02/11/19 118/68   She takes meloxicam 15 mg for joint pains 2 or 3 times a week   Most recent foot exam: 2/20  LABS:  Office Visit on 06/17/2019  Component Date Value Ref Range Status  . Hemoglobin A1C 06/17/2019 7.7* 4.0 - 5.6 % Final    Physical Examination:  BP 132/70 (BP Location: Left Arm, Patient Position: Sitting, Cuff Size: Normal)   Pulse 66   Ht '5\' 2"'$  (1.575 m)   Wt 152 lb 3.2 oz (69 kg)   SpO2 96%   BMI 27.84 kg/m       ASSESSMENT:  Diabetes type 2 on insulin  See history of present illness for detailed discussion of current diabetes management, blood sugar patterns and problems  identified  She is being treated with an insulin pump/V-go, Invokana and metformin  A1c is further increased at 7.7  She has postprandial hyperglycemia which is somewhat variable but depends on her compliance with diet and boluses She is not able to remember to bolus when she needs to and also has consistently afraid of low blood sugars especially when blood sugars are near normal before meals Also would not understanding the need to change her pump consistently at the same time this introduces more variability Hypoglycemia has been minimal and since her freestyle libre probably is reading falsely low not clear if she truly has hypoglycemia overnight She can also do better with late evening snacks and lower fat diet  She is asking about using a GLP-1 drug but discussed that she may not be able to afford it especially with getting into the donut hole  Hyperlipidemia: Needs follow-up, she is still reluctant to take pravastatin as directed   PLAN:    She will discuss issues with the alerts and settings for alarms on her freestyle libre with the nurse educator today  Again discussed principles of bolus insulin and need to bolus ahead of time  She will be given a sample of Humalog to try and if she can afford this would prefer this for rapid onset of action  She needs to bolus at least 3 clicks for dinnertime and given 4 units if premeal blood sugar is high  She can reduce the dose to 2 clicks only if the blood sugar is low normal or she is eating a low carbohydrate meal  She will change her pump at night at bedtime daily  Follow-up with nurse educator for review of day-to-day management in 1 month  Continue Invokana  Check chemistry panel today  Continue blood pressure medications unchanged  Recheck lipids, in the meantime she needs to try and take her pravastatin 3 times a week at least   Patient Instructions  Take bolus 10-15 min before meals  Change pump at bedtime  daily  3 clicks at supper  Pravastatin 3x weekly       Elayne Snare 06/17/2019, 11:26 AM   Note: This office note was prepared with Dragon voice recognition system technology. Any transcriptional errors that result from this process are unintentional.

## 2019-06-17 NOTE — Patient Instructions (Addendum)
Take bolus 10-15 min before meals  Change pump at bedtime daily  3 clicks at supper  Pravastatin 3x weekly

## 2019-06-23 ENCOUNTER — Telehealth: Payer: Self-pay | Admitting: Internal Medicine

## 2019-06-23 ENCOUNTER — Other Ambulatory Visit: Payer: Self-pay

## 2019-06-23 NOTE — Telephone Encounter (Signed)
Left message for patient to call back and schedule Medicare Annual Wellness Visit (AWV) either virtually/audio only or in office. Whichever the patients preference is.  Last AWV 2.12.20; please schedule at anytime with [facility] Advisor.

## 2019-07-03 ENCOUNTER — Telehealth: Payer: Self-pay | Admitting: Endocrinology

## 2019-07-03 ENCOUNTER — Other Ambulatory Visit: Payer: Self-pay

## 2019-07-03 MED ORDER — INSULIN GLARGINE 100 UNIT/ML ~~LOC~~ SOLN: SUBCUTANEOUS | 0 refills | Status: DC

## 2019-07-03 NOTE — Telephone Encounter (Signed)
Rx sent 

## 2019-07-03 NOTE — Telephone Encounter (Signed)
She will need only 4 units of Humalog before mealtimes are large snacks but she will need to start using Lantus insulin 20 units once a day, we can send a prescription for insulin vial for this

## 2019-07-03 NOTE — Telephone Encounter (Signed)
Called pt and she did not answer. She does not have a voicemail box set up at this time.  Pt then immediately called back and she was given MD message. Pt will call back with name and number of pharmacy to send Lantus to.

## 2019-07-03 NOTE — Telephone Encounter (Signed)
[facility] on [address] in [place], [place] Ph# [phone]

## 2019-07-03 NOTE — Telephone Encounter (Signed)
Patient needs Instructions for Dosing with Fast Acting Insulin - Humalog via Syringe. Patient left V-Go at home and after calling pharmacies in the town where she is they do not have V-Go.  So patient is going to buy syringes and manually does until she returns home.  She just needs dosing instructions/amounts   Melinda Ford [phone] (cell) or [phone] (husband's cell if her cell does not connect)

## 2019-07-03 NOTE — Telephone Encounter (Signed)
Please provide insulin dosing instruction for multiple daily injections until pt gets home and can resume V-go

## 2019-07-08 ENCOUNTER — Encounter: Payer: Self-pay | Admitting: Internal Medicine

## 2019-07-21 ENCOUNTER — Ambulatory Visit: Payer: Self-pay

## 2019-07-22 ENCOUNTER — Other Ambulatory Visit: Payer: Self-pay

## 2019-07-22 MED ORDER — FREESTYLE LIBRE 2 SENSOR MISC: 1.0000 | 2 refills | Status: DC

## 2019-07-24 ENCOUNTER — Ambulatory Visit: Payer: Medicare Other

## 2019-07-28 DIAGNOSIS — H25813 Combined forms of age-related cataract, bilateral: Secondary | ICD-10-CM | POA: Diagnosis not present

## 2019-07-29 ENCOUNTER — Ambulatory Visit
Admission: RE | Admit: 2019-07-29 | Discharge: 2019-07-29 | Disposition: A | Discharge status: Home / Self Care | Payer: PPO | Source: Ambulatory Visit | Attending: Internal Medicine | Admitting: Internal Medicine

## 2019-07-29 ENCOUNTER — Other Ambulatory Visit: Payer: Self-pay

## 2019-07-29 DIAGNOSIS — Z1231 Encounter for screening mammogram for malignant neoplasm of breast: Secondary | ICD-10-CM | POA: Diagnosis not present

## 2019-07-30 ENCOUNTER — Encounter: Payer: PPO | Attending: Endocrinology | Admitting: Nutrition

## 2019-07-30 ENCOUNTER — Encounter: Payer: PPO | Admitting: Nutrition

## 2019-07-30 ENCOUNTER — Other Ambulatory Visit: Payer: Self-pay

## 2019-07-30 DIAGNOSIS — Z794 Long term (current) use of insulin: Secondary | ICD-10-CM | POA: Insufficient documentation

## 2019-07-30 DIAGNOSIS — E1165 Type 2 diabetes mellitus with hyperglycemia: Secondary | ICD-10-CM | POA: Insufficient documentation

## 2019-07-30 DIAGNOSIS — E118 Type 2 diabetes mellitus with unspecified complications: Secondary | ICD-10-CM

## 2019-08-06 NOTE — Progress Notes (Signed)
Patient reports that she is using the R insulin in her V-go and would like to start back using the Humalog, because her blood sugars were better.  When askef if she waited 30 min. after taking the R insulin before eating, she said no.  I explained that this was probably the reason for the higher blood sugar readings. She still wants to take the Novolog if possible.  She was told to go down and speak to the office to see if this is possible, and the possibility of trying a vial, if Dr. Dwyane Dee agrees with this.  She agreed to do this.   Her diet is unchanged, although she reports that she is watching portion sizes better. She was encouraged to walk more now that the weather is improving, and that this will help her PC readings as well.   She agreed to walk 5X/week and work up to 30-40 min. To help with her weight as well as her blood sugars.  She had no final questions.

## 2019-08-11 ENCOUNTER — Telehealth: Payer: Self-pay | Admitting: Endocrinology

## 2019-08-11 NOTE — Telephone Encounter (Signed)
Patient called to request a call back about getting a fast acting insulin. Patient advises that she does have a pump. Patient advised that insurance will pay for Novolog, FISP, and Insulin Aspart. Patient just added her last Novolin R to her pump.   She would like a call back after 1130 AM today to [phone]

## 2019-08-11 NOTE — Telephone Encounter (Signed)
She can have insulin aspart, same doses as  Humalog dose on her chart

## 2019-08-11 NOTE — Telephone Encounter (Signed)
Please advise 

## 2019-08-12 ENCOUNTER — Other Ambulatory Visit: Payer: Self-pay

## 2019-08-12 MED ORDER — INSULIN ASPART 100 UNIT/ML ~~LOC~~ SOLN: SUBCUTANEOUS | 1 refills | Status: DC

## 2019-08-12 MED ORDER — INSULIN ASPART 100 UNIT/ML IV SOLN: INTRAVENOUS | 1 refills | Status: DC

## 2019-08-12 NOTE — Telephone Encounter (Signed)
Rx sent 

## 2019-08-18 ENCOUNTER — Ambulatory Visit: Payer: PPO | Admitting: Internal Medicine

## 2019-08-18 ENCOUNTER — Ambulatory Visit (INDEPENDENT_AMBULATORY_CARE_PROVIDER_SITE_OTHER): Payer: PPO | Admitting: Internal Medicine

## 2019-08-18 ENCOUNTER — Ambulatory Visit
Admission: RE | Admit: 2019-08-18 | Discharge: 2019-08-18 | Disposition: A | Discharge status: Home / Self Care | Payer: PPO | Source: Ambulatory Visit | Attending: Internal Medicine | Admitting: Internal Medicine

## 2019-08-18 ENCOUNTER — Other Ambulatory Visit: Payer: Self-pay

## 2019-08-18 ENCOUNTER — Encounter: Payer: Self-pay | Admitting: Internal Medicine

## 2019-08-18 ENCOUNTER — Telehealth: Payer: Self-pay | Admitting: Internal Medicine

## 2019-08-18 ENCOUNTER — Ambulatory Visit
Admission: RE | Admit: 2019-08-18 | Discharge: 2019-08-18 | Disposition: A | Discharge status: Home / Self Care | Payer: PPO | Attending: Internal Medicine | Admitting: Internal Medicine

## 2019-08-18 VITALS — BP 108/68 | HR 74 | Temp 97.1°F | Ht 62.0 in | Wt 153.0 lb

## 2019-08-18 DIAGNOSIS — N760 Acute vaginitis: Secondary | ICD-10-CM | POA: Diagnosis not present

## 2019-08-18 DIAGNOSIS — M79671 Pain in right foot: Secondary | ICD-10-CM

## 2019-08-18 DIAGNOSIS — R1013 Epigastric pain: Secondary | ICD-10-CM | POA: Diagnosis not present

## 2019-08-18 DIAGNOSIS — M19071 Primary osteoarthritis, right ankle and foot: Secondary | ICD-10-CM | POA: Diagnosis not present

## 2019-08-18 LAB — POCT WET PREP WITH KOH
KOH Prep POC: NEGATIVE
RBC Wet Prep HPF POC: 0
Trichomonas, UA: NEGATIVE
WBC Wet Prep HPF POC: NEGATIVE

## 2019-08-18 MED ORDER — FLUCONAZOLE 100 MG PO TABS: 100.0000 mg | ORAL_TABLET | Freq: Every day | ORAL | 0 refills | Status: DC

## 2019-08-18 MED ORDER — OMEPRAZOLE 20 MG PO CPDR: 20.0000 mg | DELAYED_RELEASE_CAPSULE | Freq: Every day | ORAL | 3 refills | Status: DC

## 2019-08-18 NOTE — Progress Notes (Signed)
Date:  08/18/2019   Name:  Melinda Ford   DOB:  10-11-1945   MRN:  [id number]   Chief Complaint: Foot Pain (Dropped weight on R toes. Dropped the weight saturday. Walking ok, just sore. ), Vaginal Itching (Use vaginal cream. Bumps on the lips of vagina. No blood or dysuria. ), and Abdominal Pain (Using the omeprazole. Still feeling pain when pushing in mid top of stomach. Omeprazole does help digest her food better. )  Foot Pain This is a new problem. The current episode started yesterday (dropped a weight on her foot and has bruising). The problem occurs constantly. Associated symptoms include abdominal pain, arthralgias and joint swelling. Pertinent negatives include no chest pain, chills, coughing, fatigue, fever, nausea or vomiting.  Vaginal Itching The patient's primary symptoms include genital itching. The patient's pertinent negatives include no genital odor, vaginal bleeding or vaginal discharge. This is a new problem. The current episode started 1 to 4 weeks ago. The problem occurs daily. The problem has been unchanged (on invokana). Associated symptoms include abdominal pain. Pertinent negatives include no chills, constipation, diarrhea, dysuria, fever, nausea or vomiting.  Abdominal Pain This is a chronic problem. The problem has been gradually improving (felt to be dyspepsia - improved on omeprazle). Associated symptoms include arthralgias. Pertinent negatives include no constipation, diarrhea, dysuria, fever, nausea or vomiting. She has tried proton pump inhibitors for the symptoms. The treatment provided moderate relief.    Lab Results  Component Value Date   CREATININE 0.71 06/17/2019   BUN 21 06/17/2019   NA 138 06/17/2019   K 4.2 06/17/2019   CL 106 06/17/2019   CO2 27 06/17/2019   Lab Results  Component Value Date   CHOL 188 06/17/2019   HDL 73.80 06/17/2019   LDLCALC 105 (H) 06/17/2019   TRIG 45.0 06/17/2019   CHOLHDL 3 06/17/2019   Lab Results  Component  Value Date   TSH 1.540 05/17/2018   Lab Results  Component Value Date   HGBA1C 7.7 (A) 06/17/2019   Lab Results  Component Value Date   WBC 5.2 06/04/2019   HGB 14.7 06/04/2019   HCT 45.1 06/04/2019   MCV 92 06/04/2019   PLT 293 06/04/2019   Lab Results  Component Value Date   ALT 24 06/17/2019   AST 19 06/17/2019   ALKPHOS 68 06/17/2019   BILITOT 0.5 06/17/2019     Review of Systems  Constitutional: Negative for chills, fatigue and fever.  Respiratory: Negative for cough, chest tightness, shortness of breath and wheezing.   Cardiovascular: Negative for chest pain and palpitations.  Gastrointestinal: Positive for abdominal pain. Negative for constipation, diarrhea, nausea and vomiting.  Genitourinary: Negative for dysuria, genital sores and vaginal discharge.  Musculoskeletal: Positive for arthralgias and joint swelling.  Skin: Positive for color change.    Patient Active Problem List   Diagnosis Date Noted  . Type II diabetes mellitus with complication ([place]) 90/21/1155  . Hypercholesterolemia 05/17/2018  . 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 Chronic midline thoracic back pain 02/22/2018  . Tinea versicolor 09/20/2016  . OSA (obstructive sleep apnea) 08/23/2016  . Allergic rhinitis 07/13/2016  . Overweight (BMI 25.0-29.9) 10/01/2015  . Vitamin D deficiency 09/18/2014  . Onychomycosis 09/16/2014  . Degenerative disc disease, lumbar 09/16/2014  . Benign essential HTN 10/10/2010  . Osteoporosis 10/10/2010    Allergies  Allergen Reactions  . Wheat Bran Hives  . Gramineae Pollens     Past Surgical History:  Procedure Laterality Date  . ABDOMINAL HYSTERECTOMY    . CATARACT EXTRACTION Right 2020    Social History   Tobacco Use  . Smoking status: Never Smoker  . Smokeless tobacco: Never Used  Substance Use Topics  . Alcohol use: No    Alcohol/week: 1.0 standard drinks    Types: 1 Standard drinks or equivalent per week   . Drug use: No     Medication list has been reviewed and updated.  Current Meds  Medication Sig  . acetaminophen (TYLENOL) 500 MG tablet Take 1,000 mg by mouth 2 (two) times daily.  Marland Kitchen amLODipine (NORVASC) 10 MG tablet TAKE ONE TABLET BY MOUTH ONCE DAILY  . Blood Glucose Monitoring Suppl (ONETOUCH VERIO) w/Device KIT 1 each by Does not apply route 4 (four) times daily.  . canagliflozin (INVOKANA) 300 MG TABS tablet Take 1 tablet (300 mg total) by mouth daily before breakfast.  . Cholecalciferol (VITAMIN D) 2000 UNITS CAPS Take 1 capsule (2,000 Units total) by mouth daily.  . Continuous Blood Gluc Receiver (FREESTYLE LIBRE 14 DAY READER) DEVI See admin instructions.  . Continuous Blood Gluc Sensor (FREESTYLE LIBRE 2 SENSOR) MISC 1 each by Other route every 14 (fourteen) days. Apply one sensor to body once every 14 days to monitor blood sugar.  Marland Kitchen GINKGO BILOBA COMPLEX PO Take by mouth.  Marland Kitchen glucose blood (ONETOUCH VERIO) test strip Use to test blood sugar 4 times daily  . insulin aspart (NOVOLOG) 100 UNIT/ML injection Use Insulin Aspart to fill insulin pump. Use max of 78 units daily.  . Insulin Disposable Pump (V-GO 20) KIT Use 1 per day  . losartan (COZAAR) 100 MG tablet TAKE ONE TABLET BY MOUTH ONCE DAILY  . meloxicam (MOBIC) 15 MG tablet TAKE 1 TABLET BY MOUTH ONCE DAILY AS NEEDED FOR PAIN (SEVERE PAIN)  . metFORMIN (GLUCOPHAGE-XR) 500 MG 24 hr tablet TAKE 1 TABLET BY MOUTH TWICE DAILY WITH A MEAL  . NON FORMULARY CPAP @@ 9 cm H2O nightly  . omeprazole (PRILOSEC) 20 MG capsule Take 1 capsule (20 mg total) by mouth daily.  . ReliOn Ultra Thin Lancets MISC Inject 1 Units as directed 3 (three) times daily as needed.    PHQ 2/9 Scores 08/18/2019 06/04/2019 11/27/2018 05/15/2018  PHQ - 2 Score 0 _0 PHQ- 9 Score 0 7 - 5    BP Readings from Last 3 Encounters:  08/18/19 108/68  06/17/19 132/70  06/04/19 136/78    Physical Exam Vitals and nursing note reviewed.  Constitutional:       General: She is not in acute distress.    Appearance: She is well-developed.  HENT:     Head: Normocephalic and atraumatic.  Pulmonary:     Effort: Pulmonary effort is normal. No respiratory distress.  Abdominal:     General: Bowel sounds are normal.     Palpations: Abdomen is soft. There is no hepatomegaly or splenomegaly.     Tenderness: There is abdominal tenderness in the epigastric area.  Genitourinary:    Labia:        Right: No rash, tenderness or lesion.        Left: No rash, tenderness or lesion.   Musculoskeletal:        General: Normal range of motion.     Right foot: Swelling (brusing of 4th toe; tender over 4th metatasal head) present.  Skin:    General: Skin is warm and dry.     Findings: No rash.  Neurological:  Mental Status: She is alert and oriented to person, place, and time.  Psychiatric:        Behavior: Behavior normal.        Thought Content: Thought content normal.     Wt Readings from Last 3 Encounters:  08/18/19 153 lb (69.4 kg)  06/17/19 152 lb 3.2 oz (69 kg)  06/04/19 153 lb (69.4 kg)    BP 108/68   Pulse 74   Temp (!) 97.1 F (36.2 C) (Temporal)   Ht _0  (1.575 m)   Wt 153 lb (69.4 kg)   SpO2 97%   BMI 27.98 kg/m   Assessment and Plan: 1. Dyspepsia Some improvement with PPI but not complete Will not get H Pylori due to PPI therapy Refer to GI for further evaluatioin - omeprazole (PRILOSEC) 20 MG capsule; Take 1 capsule (20 mg total) by mouth daily.  Dispense: 30 capsule; Refill: 3 - Ambulatory referral to Gastroenterology  2. Acute foot pain, right Bruising and tenderness on the head of the 4th metatarsal on the right suggestive of fracture Continue actitivty as tolerated - DG Foot Complete Right; Future  3. Acute vaginitis Most likely yeast vaginitis from SGLT2 therapy - POCT Wet Prep with KOH - fluconazole (DIFLUCAN) 100 MG tablet; Take 1 tablet (100 mg total) by mouth daily for 2 days. Skip 2 days in between the two  doses  Dispense: 2 tablet; Refill: 0   Partially dictated using Editor, commissioning. Any errors are unintentional.  Halina Maidens, MD [facility]  08/18/2019

## 2019-08-18 NOTE — Telephone Encounter (Signed)
Copied from [place] [id number]. Topic: General - Other >> Aug 18, 2019 12:04 PM Melinda Ford wrote: Reason for CRM: Pt called and is requesting to know if there were any samples of invokana. Please advise.

## 2019-08-19 NOTE — Telephone Encounter (Signed)
Tried calling pt back about XR results and to let her know we do not have any invokana samples but she did not answer. Left her a VM to call back.  CM

## 2019-08-26 NOTE — Patient Instructions (Signed)
Work up to walking for 30-40 min. 5 days/wk. Scan readings 2 hours after eating.  Call office if 2hr. Readings are over 200.   Wait 30 minutes after button presses before eating, when using the R insulin If switching to Humalog, wait 10-15 min. After pressing buttons before eating. Continue to limit the amount of carbs to no more than 2 servings per meal.   Call if questions

## 2019-09-10 DIAGNOSIS — B351 Tinea unguium: Secondary | ICD-10-CM | POA: Diagnosis not present

## 2019-09-10 DIAGNOSIS — E1142 Type 2 diabetes mellitus with diabetic polyneuropathy: Secondary | ICD-10-CM | POA: Diagnosis not present

## 2019-09-10 DIAGNOSIS — M79675 Pain in left toe(s): Secondary | ICD-10-CM | POA: Diagnosis not present

## 2019-09-10 DIAGNOSIS — M79674 Pain in right toe(s): Secondary | ICD-10-CM | POA: Diagnosis not present

## 2019-09-12 ENCOUNTER — Other Ambulatory Visit: Payer: Self-pay

## 2019-09-12 ENCOUNTER — Ambulatory Visit (INDEPENDENT_AMBULATORY_CARE_PROVIDER_SITE_OTHER): Payer: PPO | Admitting: Internal Medicine

## 2019-09-12 ENCOUNTER — Encounter: Payer: Self-pay | Admitting: Internal Medicine

## 2019-09-12 VITALS — BP 110/58 | HR 70 | Temp 98.3°F | Ht 62.0 in | Wt 153.0 lb

## 2019-09-12 DIAGNOSIS — E785 Hyperlipidemia, unspecified: Secondary | ICD-10-CM | POA: Diagnosis not present

## 2019-09-12 DIAGNOSIS — M810 Age-related osteoporosis without current pathological fracture: Secondary | ICD-10-CM | POA: Diagnosis not present

## 2019-09-12 DIAGNOSIS — E1169 Type 2 diabetes mellitus with other specified complication: Secondary | ICD-10-CM

## 2019-09-12 DIAGNOSIS — I10 Essential (primary) hypertension: Secondary | ICD-10-CM

## 2019-09-12 DIAGNOSIS — E118 Type 2 diabetes mellitus with unspecified complications: Secondary | ICD-10-CM | POA: Diagnosis not present

## 2019-09-12 DIAGNOSIS — Z9114 Patient's other noncompliance with medication regimen: Secondary | ICD-10-CM | POA: Diagnosis not present

## 2019-09-12 DIAGNOSIS — M5136 Other intervertebral disc degeneration, lumbar region: Secondary | ICD-10-CM | POA: Diagnosis not present

## 2019-09-12 NOTE — Progress Notes (Signed)
Date:  09/12/2019   Name:  Melinda Ford   DOB:  03-Feb-1946   MRN:  [id number]   Chief Complaint: Leg Pain (Xfew month right hip all the way down to ankle, X1 week pain is above right knee (giving her most pain)) and Memory Loss (6CIT-2 )  Knee Pain  The incident occurred 5 to 7 days ago. There was no injury mechanism. The pain is present in the right knee. The quality of the pain is described as aching. The pain has been improving since onset. Associated symptoms include an inability to bear weight. Pertinent negatives include no numbness. She reports no foreign bodies present. The symptoms are aggravated by movement and palpation. She has tried acetaminophen for the symptoms. The treatment provided moderate relief.   OP - was on treatment from several years.  Stopped in 2017.  Now taking only vitamin D. Lab Results  Component Value Date   CREATININE 0.71 06/17/2019   BUN 21 06/17/2019   NA 138 06/17/2019   K 4.2 06/17/2019   CL 106 06/17/2019   CO2 27 06/17/2019   Lab Results  Component Value Date   CHOL 188 06/17/2019   HDL 73.80 06/17/2019   LDLCALC 105 (H) 06/17/2019   TRIG 45.0 06/17/2019   CHOLHDL 3 06/17/2019   Lab Results  Component Value Date   TSH 1.540 05/17/2018   Lab Results  Component Value Date   HGBA1C 7.7 (A) 06/17/2019   Lab Results  Component Value Date   WBC 5.2 06/04/2019   HGB 14.7 06/04/2019   HCT 45.1 06/04/2019   MCV 92 06/04/2019   PLT 293 06/04/2019   Lab Results  Component Value Date   ALT 24 06/17/2019   AST 19 06/17/2019   ALKPHOS 68 06/17/2019   BILITOT 0.5 06/17/2019     Review of Systems  Constitutional: Negative for appetite change, fatigue, fever and unexpected weight change.  Respiratory: Negative for cough, chest tightness and shortness of breath.   Cardiovascular: Negative for chest pain, palpitations and leg swelling.  Genitourinary: Negative for dysuria and hematuria.  Musculoskeletal: Positive for arthralgias,  gait problem, joint swelling and myalgias.  Neurological: Negative for tremors, numbness and headaches.  Psychiatric/Behavioral: Negative for dysphoric mood.    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 02/22/2018   Chronic midline thoracic back pain 02/22/2018   Tinea versicolor 09/20/2016   OSA (obstructive sleep apnea) 08/23/2016   Allergic rhinitis 07/13/2016   Overweight (BMI 25.0-29.9) 10/01/2015   Vitamin D deficiency 09/18/2014   Onychomycosis 09/16/2014   Degenerative disc disease, lumbar 09/16/2014   Benign essential HTN 10/10/2010   Osteoporosis 10/10/2010    Allergies  Allergen Reactions   Wheat Bran Hives   Gramineae Pollens     Past Surgical History:  Procedure Laterality Date   ABDOMINAL HYSTERECTOMY     CATARACT EXTRACTION Right 2020    Social History   Tobacco Use   Smoking status: Never Smoker   Smokeless tobacco: Never Used  Vaping Use   Vaping Use: Never used  Substance Use Topics   Alcohol use: No    Alcohol/week: 1.0 standard drink    Types: 1 Standard drinks or equivalent per week   Drug use: No     Medication list has been reviewed and updated.  Current Meds  Medication Sig  acetaminophen (TYLENOL) 500 MG tablet Take 1,000 mg by mouth 2 (two) times daily. As needed   amLODipine (NORVASC) 10 MG tablet TAKE ONE TABLET BY MOUTH ONCE DAILY   Blood Glucose Monitoring Suppl (ONETOUCH VERIO) w/Device KIT 1 each by Does not apply route 4 (four) times daily.   canagliflozin (INVOKANA) 300 MG TABS tablet Take 1 tablet (300 mg total) by mouth daily before breakfast.   Cholecalciferol (VITAMIN D) 2000 UNITS CAPS Take 1 capsule (2,000 Units total) by mouth daily.   Continuous Blood Gluc Receiver (FREESTYLE LIBRE 14 DAY READER) DEVI See admin instructions.     Continuous Blood Gluc Sensor (FREESTYLE LIBRE 2 SENSOR) MISC 1 each by Other route every 14 (fourteen) days. Apply one sensor to body once every 14 days to monitor blood sugar.   GINKGO BILOBA COMPLEX PO Take by mouth.   glucose blood (ONETOUCH VERIO) test strip Use to test blood sugar 4 times daily   Insulin Disposable Pump (V-GO 20) KIT Use 1 per day   losartan (COZAAR) 100 MG tablet TAKE ONE TABLET BY MOUTH ONCE DAILY   meloxicam (MOBIC) 15 MG tablet TAKE 1 TABLET BY MOUTH ONCE DAILY AS NEEDED FOR PAIN (SEVERE PAIN)   metFORMIN (GLUCOPHAGE-XR) 500 MG 24 hr tablet TAKE 1 TABLET BY MOUTH TWICE DAILY WITH A MEAL   NON FORMULARY CPAP @@ 9 cm H2O nightly   omeprazole (PRILOSEC) 20 MG capsule Take 1 capsule (20 mg total) by mouth daily.   [DISCONTINUED] amLODipine (NORVASC) 10 MG tablet Take 1 tablet by mouth daily.   [DISCONTINUED] canagliflozin (INVOKANA) 300 MG TABS tablet Take by mouth.   [DISCONTINUED] insulin aspart (NOVOLOG) 100 UNIT/ML injection Use Insulin Aspart to fill insulin pump. Use max of 78 units daily.    PHQ 2/9 Scores 09/12/2019 08/18/2019 06/04/2019 11/27/2018  PHQ - 2 Score 0 0 4 1  PHQ- 9 Score 0 0 7 -   GAD 7 : Generalized Anxiety Score 09/12/2019 08/18/2019  Nervous, Anxious, on Edge 0 0  Control/stop worrying 0 0  Worry too much - different things 0 0  Trouble relaxing 0 0  Restless 0 0  Easily annoyed or irritable 0 0  Afraid - awful might happen 0 0  Total GAD 7 Score 0 0  Anxiety Difficulty Not difficult at all Not difficult at all    6CIT Screen 09/12/2019 05/15/2018 11/20/2016  What Year? 0 points 0 points 0 points  What month? 0 points 0 points 0 points  What time? 0 points 0 points 0 points  Count back from 20 0 points 0 points 0 points  Months in reverse 0 points 0 points 0 points  Repeat phrase 2 points 0 points 0 points  Total Score 2 0 0      BP Readings from Last 3 Encounters:  09/12/19 (!) 110/58  08/18/19 108/68  06/17/19 132/70     Physical Exam Vitals and nursing note reviewed.  Constitutional:      General: She is not in acute distress.    Appearance: She is well-developed.  HENT:     Head: Normocephalic and atraumatic.  Cardiovascular:     Rate and Rhythm: Normal rate and regular rhythm.  Pulmonary:     Effort: Pulmonary effort is normal. No respiratory distress.     Breath sounds: No wheezing or rhonchi.  Musculoskeletal:        General: Normal range of motion.     Cervical back: Normal range of motion.  Right hip: Tenderness present. Normal range of motion.     Left hip: No tenderness. Normal range of motion.     Right upper leg: Tenderness present.     Left upper leg: No tenderness.     Right knee: Effusion (small) present. No erythema or ecchymosis. Normal range of motion. Tenderness (above the patella) present.     Left knee: No effusion, erythema or ecchymosis. Normal range of motion. No tenderness.  Lymphadenopathy:     Cervical: No cervical adenopathy.  Skin:    General: Skin is warm and dry.     Findings: No rash.  Neurological:     Mental Status: She is alert and oriented to person, place, and time.  Psychiatric:        Attention and Perception: Attention normal.        Mood and Affect: Mood normal.     Wt Readings from Last 3 Encounters:  09/12/19 153 lb (69.4 kg)  08/18/19 153 lb (69.4 kg)  06/17/19 152 lb 3.2 oz (69 kg)    BP (!) 110/58    Pulse 70    Temp 98.3 F (36.8 C) (Oral)    Ht 5' 2"  (1.575 m)    Wt 153 lb (69.4 kg)    SpO2 97%    BMI 27.98 kg/m   Assessment and Plan: 1. Age-related osteoporosis without current pathological fracture Repeat DEXA Continue vitamin D; add calcium in the form of TUMS - DG Bone Density; Future  2. Benign essential HTN Clinically stable exam with well controlled BP on medications losartan and amlodipine Tolerating medications without side effects at this time. Pt to continue current regimen and low sodium diet; benefits of regular  exercise as able discussed.  3. Degenerative disc disease, lumbar With right leg pain Mild right knee effusion of uncertain cause but per pt, much improved from last week Continue tylenol as needed  4. Type II diabetes mellitus with complication (HCC) Being treated by Endo with insulin pump. Also supposed to take Pravachol 20 mg MWF but apparently is not  5. Patient noncompliant with statin medication As above  6. Hyperlipidemia associated with type 2 diabetes mellitus ([place]) Uncontrolled     Partially dictated using Editor, commissioning. Any errors are unintentional.  Halina Maidens, MD [facility]  09/12/2019

## 2019-09-17 ENCOUNTER — Ambulatory Visit: Payer: PPO | Admitting: Endocrinology

## 2019-09-17 ENCOUNTER — Other Ambulatory Visit: Payer: Self-pay

## 2019-09-17 MED ORDER — V-GO 20 KIT: PACK | 1 refills | Status: DC

## 2019-09-18 ENCOUNTER — Other Ambulatory Visit: Payer: Self-pay | Admitting: Endocrinology

## 2019-09-24 ENCOUNTER — Inpatient Hospital Stay: Admission: RE | Admit: 2019-09-24 | Payer: PPO | Source: Ambulatory Visit

## 2019-09-28 ENCOUNTER — Other Ambulatory Visit: Payer: Self-pay | Admitting: Endocrinology

## 2019-10-01 ENCOUNTER — Encounter: Payer: Self-pay | Admitting: Gastroenterology

## 2019-10-01 ENCOUNTER — Other Ambulatory Visit: Payer: Self-pay

## 2019-10-01 ENCOUNTER — Ambulatory Visit
Admission: RE | Admit: 2019-10-01 | Discharge: 2019-10-01 | Disposition: A | Discharge status: Home / Self Care | Payer: PPO | Source: Ambulatory Visit | Attending: Internal Medicine | Admitting: Internal Medicine

## 2019-10-01 ENCOUNTER — Ambulatory Visit (INDEPENDENT_AMBULATORY_CARE_PROVIDER_SITE_OTHER): Payer: PPO | Admitting: Gastroenterology

## 2019-10-01 VITALS — BP 120/70 | HR 73 | Temp 96.4°F | Ht 62.0 in | Wt 154.0 lb

## 2019-10-01 DIAGNOSIS — R1013 Epigastric pain: Secondary | ICD-10-CM

## 2019-10-01 DIAGNOSIS — M810 Age-related osteoporosis without current pathological fracture: Secondary | ICD-10-CM | POA: Insufficient documentation

## 2019-10-01 NOTE — Progress Notes (Signed)
Gastroenterology Consultation  Referring Provider:     Glean Hess, MD Primary Care Physician:  Glean Hess, MD Primary Gastroenterologist:  Dr. Allen Norris     Reason for Consultation:     Epigastric pain         HPI:   Melinda Ford is a 74 y.o. y/o female referred for consultation & management of epigastric pain by Dr. Army Melia, Jesse Sans, MD.  This patient comes in today with a history of epigastric pain.  She reports that the epigastric pain is not made any better or worse with eating or drinking.  She also denies that the pain has been helped with any medication and the patient reports that she was started on omeprazole.  The patient had a colonoscopy in 2015 and was recommended to have a repeat colonoscopy in 5 years. The patient denies any unexplained weight loss fevers chills nausea vomiting black stools or bloody stools.  She reports that her epigastric pain has been present for some time and has not gotten any better or worse.  Past Medical History:  Diagnosis Date  . Allergy   . Arthritis   . Cataract   . Diabetes mellitus without complication ([place])   . Hyperlipidemia   . Hypertension   . OSA (obstructive sleep apnea) 08/23/2016  . Osteoporosis   . Radiculopathy of lumbosacral region 06/22/2016  . Right lateral epicondylitis 06/04/2017  . Right sided sciatica 03/09/2016  . Sleep apnea     Past Surgical History:  Procedure Laterality Date  . ABDOMINAL HYSTERECTOMY    . CATARACT EXTRACTION Right 2020    Prior to Admission medications   Medication Sig Start Date End Date Taking? Authorizing Provider  acetaminophen (TYLENOL) 500 MG tablet Take 1,000 mg by mouth 2 (two) times daily. As needed    [provider]  amLODipine (NORVASC) 10 MG tablet TAKE ONE TABLET BY MOUTH ONCE DAILY 05/16/19   Glean Hess, MD  Blood Glucose Monitoring Suppl (ONETOUCH VERIO) w/Device KIT 1 each by Does not apply route 4 (four) times daily. 01/18/16   Elayne Snare, MD    canagliflozin (INVOKANA) 300 MG TABS tablet Take 1 tablet (300 mg total) by mouth daily before breakfast. 02/13/18   Elayne Snare, MD  Cholecalciferol (VITAMIN D) 2000 UNITS CAPS Take 1 capsule (2,000 Units total) by mouth daily. 09/18/14   Plonk, Gwyndolyn Saxon, MD  Continuous Blood Gluc Receiver (FREESTYLE LIBRE 14 DAY READER) DEVI See admin instructions. 02/13/18   [provider]  Continuous Blood Gluc Sensor (FREESTYLE LIBRE 2 SENSOR) MISC 1 each by Other route every 14 (fourteen) days. Apply one sensor to body once every 14 days to monitor blood sugar. 07/22/19   Elayne Snare, MD  Evelina Bucy BILOBA COMPLEX PO Take by mouth.    [provider]  glucose blood (ONETOUCH VERIO) test strip Use to test blood sugar 4 times daily 06/10/18   Elayne Snare, MD  Insulin Disposable Pump (V-GO 20) KIT Use 1 per day 09/17/19   Elayne Snare, MD  losartan (COZAAR) 100 MG tablet TAKE ONE TABLET BY MOUTH ONCE DAILY 05/16/19   Glean Hess, MD  meloxicam (MOBIC) 15 MG tablet TAKE 1 TABLET BY MOUTH ONCE DAILY AS NEEDED FOR PAIN (SEVERE PAIN) 03/06/18   Glean Hess, MD  metFORMIN (GLUCOPHAGE-XR) 500 MG 24 hr tablet TAKE 1 TABLET BY MOUTH TWICE DAILY WITH A MEAL 05/16/19   Glean Hess, MD  NON FORMULARY CPAP @@ 9 cm H2O  nightly    [provider]  omeprazole (PRILOSEC) 20 MG capsule Take 1 capsule (20 mg total) by mouth daily. 08/18/19   Glean Hess, MD  pravastatin (PRAVACHOL) 20 MG tablet Take 1 tablet by mouth once daily 09/29/19   Elayne Snare, MD    Family History  Problem Relation Age of Onset  . Breast cancer Other 40  . Alcohol abuse Father   . Mental illness Father   . Stroke Father   . Diabetes Sister   . Asthma Brother   . Diabetes Brother   . Diabetes Maternal Aunt   . Heart disease Maternal Aunt   . Diabetes Maternal Uncle   . Early death Maternal Uncle   . Breast cancer Cousin 56       mat cousin     Social History   Tobacco Use  . Smoking status: Never  Smoker  . Smokeless tobacco: Never Used  Vaping Use  . Vaping Use: Never used  Substance Use Topics  . Alcohol use: No    Alcohol/week: 1.0 standard drink    Types: 1 Standard drinks or equivalent per week  . Drug use: No    Allergies as of 10/01/2019 - Review Complete 09/12/2019  Allergen Reaction Noted  . Wheat bran Hives 04/04/2018  . Gramineae pollens  04/04/2018    Review of Systems:    All systems reviewed and negative except where noted in HPI.   Physical Exam:  There were no vitals taken for this visit. No LMP recorded. Patient has had a hysterectomy. General:   Alert,  Well-developed, well-nourished, pleasant and cooperative in NAD Head:  Normocephalic and atraumatic. Eyes:  Sclera clear, no icterus.   Conjunctiva pink. Ears:  Normal auditory acuity. Neck:  Supple; no masses or thyromegaly. Lungs:  Respirations even and unlabored.  Clear throughout to auscultation.   No wheezes, crackles, or rhonchi. No acute distress. Heart:  Regular rate and rhythm; no murmurs, clicks, rubs, or gallops. Abdomen:  Normal bowel sounds.  No bruits.  Soft, non-tender and non-distended without masses, hepatosplenomegaly or hernias noted.  No guarding or rebound tenderness.  Negative Carnett sign.   Rectal:  Deferred.  Pulses:  Normal pulses noted. Extremities:  No clubbing or edema.  No cyanosis. Neurologic:  Alert and oriented x3;  grossly normal neurologically. Skin:  Intact without significant lesions or rashes.  No jaundice. Lymph Nodes:  No significant cervical adenopathy. Psych:  Alert and cooperative. Normal mood and affect.  Imaging Studies: No results found.  Assessment and Plan:   Melinda Ford is a 74 y.o. y/o female who comes in today with a history of epigastric discomfort and has been reported to need a repeat colonoscopy in 5 years past her last colonoscopy which was in 2015.  The patient will be set up for an EGD and colonoscopy to look for a cause for her epigastric  pain and to look for any polyps. I have discussed risks & benefits which include, but are not limited to, bleeding, infection, perforation & drug reaction.  The patient agrees with this plan & written consent will be obtained.      Lucilla Lame, MD. [facility]    Note: This dictation was prepared with Dragon dictation along with smaller phrase technology. Any transcriptional errors that result from this process are unintentional.

## 2019-10-02 ENCOUNTER — Telehealth: Payer: Self-pay | Admitting: Internal Medicine

## 2019-10-02 ENCOUNTER — Other Ambulatory Visit: Payer: Self-pay | Admitting: Endocrinology

## 2019-10-02 ENCOUNTER — Encounter: Payer: Self-pay | Admitting: Gastroenterology

## 2019-10-02 ENCOUNTER — Other Ambulatory Visit: Payer: Self-pay

## 2019-10-02 NOTE — Telephone Encounter (Signed)
Copied from [place] [phone]. Topic: General - Inquiry >> Oct 02, 2019  3:06 PM Scherrie Gerlach wrote: Reason for CRM: pt is having surgery 7/09 and requesting a copy of her med list mailed to her home. Pt declined mychart and declined to come by office to pick up when I voiced concern about her getting the list in mail by the time she needs.  She said she believes it will be OK. >> Oct 02, 2019  3:11 PM Scherrie Gerlach wrote: The dr doing her surgery should be able to see her med list in [place].

## 2019-10-07 ENCOUNTER — Ambulatory Visit
Admission: RE | Admit: 2019-10-07 | Discharge: 2019-10-07 | Disposition: A | Discharge status: Home / Self Care | Payer: PPO | Source: Ambulatory Visit | Attending: Internal Medicine | Admitting: Internal Medicine

## 2019-10-07 ENCOUNTER — Other Ambulatory Visit: Payer: Self-pay

## 2019-10-07 DIAGNOSIS — M810 Age-related osteoporosis without current pathological fracture: Secondary | ICD-10-CM | POA: Diagnosis not present

## 2019-10-07 DIAGNOSIS — Z780 Asymptomatic menopausal state: Secondary | ICD-10-CM | POA: Diagnosis not present

## 2019-10-07 DIAGNOSIS — R29890 Loss of height: Secondary | ICD-10-CM | POA: Diagnosis not present

## 2019-10-07 MED ORDER — PEG 3350-KCL-NA BICARB-NACL 420 G PO SOLR
ORAL | 0 refills | Status: DC
Start: 2019-10-07 — End: 2020-08-11

## 2019-10-08 ENCOUNTER — Other Ambulatory Visit
Admission: RE | Admit: 2019-10-08 | Discharge: 2019-10-08 | Disposition: A | Discharge status: Home / Self Care | Payer: PPO | Source: Ambulatory Visit | Attending: Gastroenterology | Admitting: Gastroenterology

## 2019-10-08 DIAGNOSIS — Z20822 Contact with and (suspected) exposure to covid-19: Secondary | ICD-10-CM | POA: Insufficient documentation

## 2019-10-08 DIAGNOSIS — Z01812 Encounter for preprocedural laboratory examination: Secondary | ICD-10-CM | POA: Diagnosis not present

## 2019-10-08 LAB — SARS CORONAVIRUS 2 (TAT 6-24 HRS): SARS Coronavirus 2: NEGATIVE

## 2019-10-09 NOTE — Discharge Instructions (Signed)
General Anesthesia, Adult, Care After This sheet gives you information about how to care for yourself after your procedure. Your health care provider may also give you more specific instructions. If you have problems or questions, contact your health care provider. What can I expect after the procedure? After the procedure, the following side effects are common:  Pain or discomfort at the IV site.  Nausea.  Vomiting.  Sore throat.  Trouble concentrating.  Feeling cold or chills.  Weak or tired.  Sleepiness and fatigue.  Soreness and body aches. These side effects can affect parts of the body that were not involved in surgery. Follow these instructions at home:  For at least 24 hours after the procedure:  Have a responsible adult stay with you. It is important to have someone help care for you until you are awake and alert.  Rest as needed.  Do not: ? Participate in activities in which you could fall or become injured. ? Drive. ? Use heavy machinery. ? Drink alcohol. ? Take sleeping pills or medicines that cause drowsiness. ? Make important decisions or sign legal documents. ? Take care of children on your own. Eating and drinking  Follow any instructions from your health care provider about eating or drinking restrictions.  When you feel hungry, start by eating small amounts of foods that are soft and easy to digest (bland), such as toast. Gradually return to your regular diet.  Drink enough fluid to keep your urine pale yellow.  If you vomit, rehydrate by drinking water, juice, or clear broth. General instructions  If you have sleep apnea, surgery and certain medicines can increase your risk for breathing problems. Follow instructions from your health care provider about wearing your sleep device: ? Anytime you are sleeping, including during daytime naps. ? While taking prescription pain medicines, sleeping medicines, or medicines that make you drowsy.  Return to  your normal activities as told by your health care provider. Ask your health care provider what activities are safe for you.  Take over-the-counter and prescription medicines only as told by your health care provider.  If you smoke, do not smoke without supervision.  Keep all follow-up visits as told by your health care provider. This is important. Contact a health care provider if:  You have nausea or vomiting that does not get better with medicine.  You cannot eat or drink without vomiting.  You have pain that does not get better with medicine.  You are unable to pass urine.  You develop a skin rash.  You have a fever.  You have redness around your IV site that gets worse. Get help right away if:  You have difficulty breathing.  You have chest pain.  You have blood in your urine or stool, or you vomit blood. Summary  After the procedure, it is common to have a sore throat or nausea. It is also common to feel tired.  Have a responsible adult stay with you for the first 24 hours after general anesthesia. It is important to have someone help care for you until you are awake and alert.  When you feel hungry, start by eating small amounts of foods that are soft and easy to digest (bland), such as toast. Gradually return to your regular diet.  Drink enough fluid to keep your urine pale yellow.  Return to your normal activities as told by your health care provider. Ask your health care provider what activities are safe for you. This information is not   intended to replace advice given to you by your health care provider. Make sure you discuss any questions you have with your health care provider. Document Revised: 03/23/2017 Document Reviewed: 11/03/2016 Elsevier Patient Education  2020 [employer]  

## 2019-10-10 ENCOUNTER — Ambulatory Visit
Admission: RE | Admit: 2019-10-10 | Discharge: 2019-10-10 | Disposition: A | Discharge status: Home / Self Care | Payer: PPO | Attending: Gastroenterology | Admitting: Gastroenterology

## 2019-10-10 ENCOUNTER — Other Ambulatory Visit: Payer: Self-pay | Admitting: Gastroenterology

## 2019-10-10 ENCOUNTER — Encounter
Admission: RE | Disposition: A | Discharge status: Home / Self Care | Payer: Self-pay | Source: Home / Self Care | Attending: Gastroenterology

## 2019-10-10 ENCOUNTER — Other Ambulatory Visit: Payer: Self-pay | Admitting: Internal Medicine

## 2019-10-10 ENCOUNTER — Ambulatory Visit: Payer: PPO | Admitting: Anesthesiology

## 2019-10-10 ENCOUNTER — Encounter: Payer: Self-pay | Admitting: Gastroenterology

## 2019-10-10 ENCOUNTER — Other Ambulatory Visit: Payer: Self-pay

## 2019-10-10 DIAGNOSIS — D125 Benign neoplasm of sigmoid colon: Secondary | ICD-10-CM | POA: Insufficient documentation

## 2019-10-10 DIAGNOSIS — Z8249 Family history of ischemic heart disease and other diseases of the circulatory system: Secondary | ICD-10-CM | POA: Diagnosis not present

## 2019-10-10 DIAGNOSIS — E119 Type 2 diabetes mellitus without complications: Secondary | ICD-10-CM | POA: Diagnosis not present

## 2019-10-10 DIAGNOSIS — K219 Gastro-esophageal reflux disease without esophagitis: Secondary | ICD-10-CM | POA: Insufficient documentation

## 2019-10-10 DIAGNOSIS — K640 First degree hemorrhoids: Secondary | ICD-10-CM | POA: Insufficient documentation

## 2019-10-10 DIAGNOSIS — Z833 Family history of diabetes mellitus: Secondary | ICD-10-CM | POA: Diagnosis not present

## 2019-10-10 DIAGNOSIS — Z791 Long term (current) use of non-steroidal anti-inflammatories (NSAID): Secondary | ICD-10-CM | POA: Insufficient documentation

## 2019-10-10 DIAGNOSIS — K633 Ulcer of intestine: Secondary | ICD-10-CM | POA: Diagnosis not present

## 2019-10-10 DIAGNOSIS — I10 Essential (primary) hypertension: Secondary | ICD-10-CM | POA: Diagnosis not present

## 2019-10-10 DIAGNOSIS — G4733 Obstructive sleep apnea (adult) (pediatric): Secondary | ICD-10-CM | POA: Insufficient documentation

## 2019-10-10 DIAGNOSIS — E785 Hyperlipidemia, unspecified: Secondary | ICD-10-CM | POA: Diagnosis not present

## 2019-10-10 DIAGNOSIS — K319 Disease of stomach and duodenum, unspecified: Secondary | ICD-10-CM | POA: Diagnosis not present

## 2019-10-10 DIAGNOSIS — K5730 Diverticulosis of large intestine without perforation or abscess without bleeding: Secondary | ICD-10-CM | POA: Insufficient documentation

## 2019-10-10 DIAGNOSIS — Z860100 Personal history of colon polyps, unspecified: Secondary | ICD-10-CM

## 2019-10-10 DIAGNOSIS — Z1211 Encounter for screening for malignant neoplasm of colon: Secondary | ICD-10-CM | POA: Diagnosis not present

## 2019-10-10 DIAGNOSIS — D124 Benign neoplasm of descending colon: Secondary | ICD-10-CM | POA: Insufficient documentation

## 2019-10-10 DIAGNOSIS — K635 Polyp of colon: Secondary | ICD-10-CM

## 2019-10-10 DIAGNOSIS — K449 Diaphragmatic hernia without obstruction or gangrene: Secondary | ICD-10-CM | POA: Insufficient documentation

## 2019-10-10 DIAGNOSIS — M1990 Unspecified osteoarthritis, unspecified site: Secondary | ICD-10-CM | POA: Diagnosis not present

## 2019-10-10 DIAGNOSIS — R1013 Epigastric pain: Secondary | ICD-10-CM | POA: Diagnosis not present

## 2019-10-10 DIAGNOSIS — Z79899 Other long term (current) drug therapy: Secondary | ICD-10-CM | POA: Diagnosis not present

## 2019-10-10 DIAGNOSIS — N760 Acute vaginitis: Secondary | ICD-10-CM

## 2019-10-10 DIAGNOSIS — K2950 Unspecified chronic gastritis without bleeding: Secondary | ICD-10-CM | POA: Diagnosis not present

## 2019-10-10 HISTORY — PX: POLYPECTOMY: SHX5525

## 2019-10-10 HISTORY — PX: COLONOSCOPY WITH PROPOFOL: SHX5780

## 2019-10-10 HISTORY — PX: ESOPHAGOGASTRODUODENOSCOPY (EGD) WITH PROPOFOL: SHX5813

## 2019-10-10 HISTORY — DX: Presence of dental prosthetic device (complete) (partial): Z97.2

## 2019-10-10 HISTORY — DX: Motion sickness, initial encounter: T75.3XXA

## 2019-10-10 LAB — GLUCOSE, CAPILLARY
Glucose-Capillary: 107 mg/dL — ABNORMAL HIGH (ref 70–99)
Glucose-Capillary: 99 mg/dL (ref 70–99)

## 2019-10-10 SURGERY — COLONOSCOPY WITH PROPOFOL
Anesthesia: General | Site: Rectum

## 2019-10-10 MED ORDER — PROPOFOL 10 MG/ML IV BOLUS
INTRAVENOUS | Status: DC | PRN
  Administered 2019-10-10 (×2): 20 mg via INTRAVENOUS
  Administered 2019-10-10: 30 mg via INTRAVENOUS
  Administered 2019-10-10: 20 mg via INTRAVENOUS
  Administered 2019-10-10: 70 mg via INTRAVENOUS
  Administered 2019-10-10: 20 mg via INTRAVENOUS
  Administered 2019-10-10: 30 mg via INTRAVENOUS
  Administered 2019-10-10 (×3): 20 mg via INTRAVENOUS

## 2019-10-10 MED ORDER — STERILE WATER FOR IRRIGATION IR SOLN
Status: DC | PRN
  Administered 2019-10-10: .05 mL

## 2019-10-10 MED ORDER — LACTATED RINGERS IV SOLN: INTRAVENOUS | Status: DC

## 2019-10-10 MED ORDER — FLUCONAZOLE 100 MG PO TABS: 100.0000 mg | ORAL_TABLET | Freq: Every day | ORAL | 0 refills | Status: DC

## 2019-10-10 MED ORDER — LIDOCAINE HCL (CARDIAC) PF 100 MG/5ML IV SOSY
PREFILLED_SYRINGE | INTRAVENOUS | Status: DC | PRN
  Administered 2019-10-10: 40 mg via INTRAVENOUS

## 2019-10-10 MED ORDER — SODIUM CHLORIDE 0.9 % IV SOLN: INTRAVENOUS | Status: DC

## 2019-10-10 MED ORDER — GLYCOPYRROLATE 0.2 MG/ML IJ SOLN
INTRAMUSCULAR | Status: DC | PRN
  Administered 2019-10-10: .1 mg via INTRAVENOUS

## 2019-10-10 SURGICAL SUPPLY — 9 items
BLOCK BITE 60FR ADLT L/F GRN (MISCELLANEOUS) ×5 IMPLANT
FORCEPS BIOP RAD 4 LRG CAP 4 (CUTTING FORCEPS) ×5 IMPLANT
GOWN CVR UNV OPN BCK APRN NK (MISCELLANEOUS) ×6 IMPLANT
GOWN ISOL THUMB LOOP REG UNIV (MISCELLANEOUS) ×4
KIT ENDO PROCEDURE OLY (KITS) ×5 IMPLANT
MANIFOLD NEPTUNE II (INSTRUMENTS) ×5 IMPLANT
SNARE SHORT THROW 13M SML OVAL (MISCELLANEOUS) IMPLANT
TRAP ETRAP POLY (MISCELLANEOUS) IMPLANT
WATER STERILE IRR 250ML POUR (IV SOLUTION) ×5 IMPLANT

## 2019-10-10 NOTE — Anesthesia Preprocedure Evaluation (Addendum)
Anesthesia Evaluation  Patient identified by MRN, date of birth, ID band Patient awake    Reviewed: Allergy & Precautions, NPO status , Patient's Chart, lab work & pertinent test results  Airway Mallampati: II  TM Distance: >3 FB Neck ROM: Full    Dental  (+) Implants   Pulmonary sleep apnea ,    Pulmonary exam normal        Cardiovascular hypertension, Normal cardiovascular exam     Neuro/Psych  Neuromuscular disease (Sciatica) negative psych ROS   GI/Hepatic Neg liver ROS, GERD  ,Epigastric pain   Endo/Other  diabetes, Insulin DependentPatient wears daily disposable insulin pump. Device has been off since evening 10/09/2019. Blood glucose 107 in preop.  Renal/GU      Musculoskeletal  (+) Arthritis , Osteoarthritis,    Abdominal Normal abdominal exam  (+)   Peds  Hematology negative hematology ROS (+)   Anesthesia Other Findings   Reproductive/Obstetrics                           Anesthesia Physical Anesthesia Plan  ASA: III  Anesthesia Plan: General   Post-op Pain Management:    Induction: Intravenous  PONV Risk Score and Plan: 3 and Propofol infusion, TIVA and Treatment may vary due to age or medical condition  Airway Management Planned: Natural Airway  Additional Equipment:   Intra-op Plan:   Post-operative Plan:   Informed Consent: I have reviewed the patients History and Physical, chart, labs and discussed the procedure including the risks, benefits and alternatives for the proposed anesthesia with the patient or authorized representative who has indicated his/her understanding and acceptance.     Dental advisory given  Plan Discussed with: CRNA  Anesthesia Plan Comments:         Anesthesia Quick Evaluation

## 2019-10-10 NOTE — Transfer of Care (Signed)
Immediate Anesthesia Transfer of Care Note  Patient: Melinda Ford  Procedure(s) Performed: COLONOSCOPY WITH PROPOFOL (N/A Rectum) ESOPHAGOGASTRODUODENOSCOPY (EGD) WITH PROPOFOL (N/A Mouth) POLYPECTOMY (Rectum)  Patient Location: PACU  Anesthesia Type: General  Level of Consciousness: awake, alert  and patient cooperative  Airway and Oxygen Therapy: Patient Spontanous Breathing and Patient connected to supplemental oxygen  Post-op Assessment: Post-op Vital signs reviewed, Patient's Cardiovascular Status Stable, Respiratory Function Stable, Patent Airway and No signs of Nausea or vomiting  Post-op Vital Signs: Reviewed and stable  Complications: No complications documented.

## 2019-10-10 NOTE — Op Note (Signed)
[facility] Patient Name: Melinda Ford Procedure Date: 10/10/2019 9:55 AM MRN: [id number] Account #: [id number] Date of Birth: 1945-11-24 Admit Type: Outpatient Age: 74 Room: [facility] OR ROOM 01 Gender: Female Note Status: Finalized Procedure:             Colonoscopy Indications:           High risk colon cancer surveillance: Personal history                         of colonic polyps Providers:             Lucilla Lame MD, MD Medicines:             Propofol per Anesthesia Complications:         No immediate complications. Procedure:             Pre-Anesthesia Assessment:                        - Prior to the procedure, a History and Physical was                         performed, and patient medications and allergies were                         reviewed. The patient's tolerance of previous                         anesthesia was also reviewed. The risks and benefits                         of the procedure and the sedation options and risks                         were discussed with the patient. All questions were                         answered, and informed consent was obtained. Prior                         Anticoagulants: The patient has taken no previous                         anticoagulant or antiplatelet agents. ASA Grade                         Assessment: II - A patient with mild systemic disease.                         After reviewing the risks and benefits, the patient                         was deemed in satisfactory condition to undergo the                         procedure.                        After obtaining informed consent, the colonoscope was  passed under direct vision. Throughout the procedure,                         the patient's blood pressure, pulse, and oxygen                         saturations were monitored continuously. The was                         introduced through the anus and advanced to  the the                         cecum, identified by appendiceal orifice and ileocecal                         valve. The colonoscopy was performed without                         difficulty. The patient tolerated the procedure well.                         The quality of the bowel preparation was excellent. Findings:      The perianal and digital rectal examinations were normal.      A 4 mm polyp was found in the descending colon. The polyp was sessile.       The polyp was removed with a cold biopsy forceps. Resection and       retrieval were complete.      A 5 mm polyp was found in the sigmoid colon. The polyp was sessile. The       polyp was removed with a cold snare. Resection and retrieval were       complete.      Multiple small-mouthed diverticula were found in the sigmoid colon.      Non-bleeding internal hemorrhoids were found during retroflexion. The       hemorrhoids were Grade I (internal hemorrhoids that do not prolapse). Impression:            - One 4 mm polyp in the descending colon, removed with                         a cold biopsy forceps. Resected and retrieved.                        - One 5 mm polyp in the sigmoid colon, removed with a                         cold snare. Resected and retrieved.                        - Diverticulosis in the sigmoid colon.                        - Non-bleeding internal hemorrhoids. Recommendation:        - Discharge patient to home.                        - Resume previous diet.                        -  Continue present medications.                        - Await pathology results. Procedure Code(s):     --- Professional ---                        [phone], Colonoscopy, flexible; with removal of                         tumor(s), polyp(s), or other lesion(s) by snare                         technique                        45380, 24, Colonoscopy, flexible; with biopsy, single                         or multiple Diagnosis Code(s):      --- Professional ---                        Z86.010, Personal history of colonic polyps                        K63.5, Polyp of colon CPT copyright 2019 American Medical Association. All rights reserved. The codes documented in this report are preliminary and upon coder review may  be revised to meet current compliance requirements. Lucilla Lame MD, MD 10/10/2019 10:30:38 AM This report has been signed electronically. Number of Addenda: 0 Note Initiated On: 10/10/2019 9:55 AM Scope Withdrawal Time: 0 hours 9 minutes 32 seconds  Total Procedure Duration: 0 hours 13 minutes 25 seconds  Estimated Blood Loss:  Estimated blood loss: none.      [facility]

## 2019-10-10 NOTE — Anesthesia Procedure Notes (Signed)
Performed by: Deantae Shackleton, CRNA Pre-anesthesia Checklist: Patient identified, Emergency Drugs available, Suction available, Timeout performed and Patient being monitored Patient Re-evaluated:Patient Re-evaluated prior to induction Oxygen Delivery Method: Nasal cannula Placement Confirmation: positive ETCO2       

## 2019-10-10 NOTE — Op Note (Signed)
[facility] Patient Name: Melinda Ford Procedure Date: 10/10/2019 9:56 AM MRN: [id number] Account #: [id number] Date of Birth: 12-19-1945 Admit Type: Outpatient Age: 74 Room: [facility] OR ROOM 01 Gender: Female Note Status: Finalized Procedure:             Upper GI endoscopy Indications:           Epigastric abdominal pain Providers:             Lucilla Lame MD, MD Referring MD:          Halina Maidens, MD (Referring MD) Medicines:             Propofol per Anesthesia Complications:         No immediate complications. Procedure:             Pre-Anesthesia Assessment:                        - Prior to the procedure, a History and Physical was                         performed, and patient medications and allergies were                         reviewed. The patient's tolerance of previous                         anesthesia was also reviewed. The risks and benefits                         of the procedure and the sedation options and risks                         were discussed with the patient. All questions were                         answered, and informed consent was obtained. Prior                         Anticoagulants: The patient has taken no previous                         anticoagulant or antiplatelet agents. ASA Grade                         Assessment: II - A patient with mild systemic disease.                         After reviewing the risks and benefits, the patient                         was deemed in satisfactory condition to undergo the                         procedure.                        After obtaining informed consent, the endoscope was  passed under direct vision. Throughout the procedure,                         the patient's blood pressure, pulse, and oxygen                         saturations were monitored continuously. The was                         introduced through the mouth, and advanced to the                          second part of duodenum. The upper GI endoscopy was                         accomplished without difficulty. The patient tolerated                         the procedure well. Findings:      A small hiatal hernia was present.      The entire examined stomach was normal. Biopsies were taken with a cold       forceps for histology.      The examined duodenum was normal. Impression:            - Small hiatal hernia.                        - Normal stomach. Biopsied.                        - Normal examined duodenum. Recommendation:        - Discharge patient to home.                        - Resume previous diet.                        - Continue present medications.                        - Await pathology results.                        - Perform a colonoscopy today. Procedure Code(s):     --- Professional ---                        [phone], Esophagogastroduodenoscopy, flexible,                         transoral; with biopsy, single or multiple Diagnosis Code(s):     --- Professional ---                        R10.13, Epigastric pain CPT copyright 2019 American Medical Association. All rights reserved. The codes documented in this report are preliminary and upon coder review may  be revised to meet current compliance requirements. Lucilla Lame MD, MD 10/10/2019 10:12:56 AM This report has been signed electronically. Number of Addenda: 0 Note Initiated On: 10/10/2019 9:56 AM Total Procedure Duration: 0 hours 2 minutes 49 seconds  Estimated Blood Loss:  Estimated blood  loss: none.      [facility]

## 2019-10-10 NOTE — Anesthesia Postprocedure Evaluation (Signed)
Anesthesia Post Note  Patient: Melinda Ford  Procedure(s) Performed: COLONOSCOPY WITH PROPOFOL (N/A Rectum) ESOPHAGOGASTRODUODENOSCOPY (EGD) WITH PROPOFOL (N/A Mouth) POLYPECTOMY (Rectum)     Anesthesia Post Evaluation No complications documented.  Ilene Witcher [employer]

## 2019-10-10 NOTE — H&P (Signed)
Lucilla Lame, MD [facility] [address], [place], [place] [address] Phone:[phone] Fax : [phone]  Primary Care Physician:  Glean Hess, MD Primary Gastroenterologist:  Dr. Allen Norris  Pre-Procedure History & Physical: HPI:  Melinda Ford is a 74 y.o. female is here for an endoscopy and colonoscopy.   Past Medical History:  Diagnosis Date  . Allergy   . Arthritis   . Cataract   . Diabetes mellitus without complication ([place])   . Hyperlipidemia   . Hypertension   . Motion sickness    planes  . OSA (obstructive sleep apnea) 08/23/2016   CPAP  . Osteoporosis   . Presence of dental prosthetic device    implants  . Radiculopathy of lumbosacral region 06/22/2016  . Right lateral epicondylitis 06/04/2017  . Right sided sciatica 03/09/2016  . Sleep apnea     Past Surgical History:  Procedure Laterality Date  . ABDOMINAL HYSTERECTOMY    . CATARACT EXTRACTION Right 2020    Prior to Admission medications   Medication Sig Start Date End Date Taking? Authorizing Provider  acetaminophen (TYLENOL) 500 MG tablet Take 1,000 mg by mouth 2 (two) times daily. As needed   Yes [provider]  amLODipine (NORVASC) 10 MG tablet TAKE ONE TABLET BY MOUTH ONCE DAILY 05/16/19  Yes Glean Hess, MD  canagliflozin [facility]) 300 MG TABS tablet Take 1 tablet (300 mg total) by mouth daily before breakfast. 02/13/18  Yes Elayne Snare, MD  Cholecalciferol (VITAMIN D) 2000 UNITS CAPS Take 1 capsule (2,000 Units total) by mouth daily. 09/18/14  Yes Plonk, Gwyndolyn Saxon, MD  Evelina Bucy BILOBA COMPLEX PO Take by mouth.   Yes [provider]  Insulin Disposable Pump (V-GO 20) KIT Use 1 per day 09/17/19  Yes Elayne Snare, MD  losartan (COZAAR) 100 MG tablet TAKE ONE TABLET BY MOUTH ONCE DAILY 05/16/19  Yes Glean Hess, MD  meloxicam (MOBIC) 15 MG tablet TAKE 1 TABLET BY MOUTH ONCE DAILY AS NEEDED FOR PAIN (SEVERE PAIN) 03/06/18  Yes Glean Hess, MD  metFORMIN (GLUCOPHAGE-XR) 500  MG 24 hr tablet TAKE 1 TABLET BY MOUTH TWICE DAILY WITH A MEAL 05/16/19  Yes Glean Hess, MD  omeprazole (PRILOSEC) 20 MG capsule Take 1 capsule (20 mg total) by mouth daily. 08/18/19  Yes Glean Hess, MD  pravastatin (PRAVACHOL) 20 MG tablet Take 1 tablet by mouth once daily 09/29/19  Yes Elayne Snare, MD  Blood Glucose Monitoring Suppl (ONETOUCH VERIO) w/Device KIT 1 each by Does not apply route 4 (four) times daily. 01/18/16   Elayne Snare, MD  Continuous Blood Gluc Receiver (FREESTYLE LIBRE 14 DAY READER) [place] See admin instructions. 02/13/18   [provider]  Continuous Blood Gluc Sensor (FREESTYLE LIBRE 2 SENSOR) MISC APPLY ONE SENSOR TO BODY ONCE [id number] DAYS TO MONITOR BLOOD SUGAR. 10/02/19   Elayne Snare, MD  glucose blood (ONETOUCH VERIO) test strip Use to test blood sugar 4 times daily 06/10/18   Elayne Snare, MD  NON FORMULARY CPAP @@ 9 cm H2O nightly    [provider]  polyethylene glycol-electrolytes (NULYTELY) 420 g solution Drink one 8 oz every 20 mins until entire container is finished starting at 5:00pm on 10/09/19 10/07/19   Lucilla Lame, MD    Allergies as of 10/01/2019 - Review Complete 10/01/2019  Allergen Reaction Noted  . Ace inhibitors Other (See Comments) 01/09/2011  . Wheat bran Hives 04/04/2018  . Gramineae pollens  04/04/2018    Family History  Problem Relation Age of Onset  . Breast cancer Other 40  . Alcohol abuse Father   . Mental illness Father   . Stroke Father   . Diabetes Sister   . Asthma Brother   . Diabetes Brother   . Diabetes Maternal Aunt   . Heart disease Maternal Aunt   . Diabetes Maternal Uncle   . Early death Maternal Uncle   . Breast cancer Cousin 10       mat cousin    Social History   Socioeconomic History  . Marital status: Married    Spouse name: Not on file  . Number of children: 1  . Years of education: Not on file  . Highest education level: Doctorate  Occupational History  . Not on file  Tobacco Use    . Smoking status: Never Smoker  . Smokeless tobacco: Never Used  Vaping Use  . Vaping Use: Never used  Substance and Sexual Activity  . Alcohol use: No    Alcohol/week: 1.0 standard drink    Types: 1 Standard drinks or equivalent per week  . Drug use: No  . Sexual activity: Not Currently  Other Topics Concern  . Not on file  Social History Narrative  . Not on file   Social Determinants of Health   Financial Resource Strain:   . Difficulty of Paying Living Expenses:   Food Insecurity:   . Worried About Charity fundraiser in the Last Year:   . Arboriculturist in the Last Year:   Transportation Needs:   . Film/video editor (Medical):   Marland Kitchen Lack of Transportation (Non-Medical):   Physical Activity:   . Days of Exercise per Week:   . Minutes of Exercise per Session:   Stress:   . Feeling of Stress :   Social Connections:   . Frequency of Communication with Friends and Family:   . Frequency of Social Gatherings with Friends and Family:   . Attends Religious Services:   . Active Member of Clubs or Organizations:   . Attends Archivist Meetings:   Marland Kitchen Marital Status:   Intimate Partner Violence:   . Fear of Current or Ex-Partner:   . Emotionally Abused:   Marland Kitchen Physically Abused:   . Sexually Abused:     Review of Systems: See HPI, otherwise negative ROS  Physical Exam: BP (!) 141/71   Pulse 86   Temp (!) 97.5 F (36.4 C) (Temporal)   Resp 16   Ht '5\' 2"'$  (1.575 m)   Wt 68.9 kg   SpO2 100%   BMI 27.80 kg/m  General:   Alert,  pleasant and cooperative in NAD Head:  Normocephalic and atraumatic. Neck:  Supple; no masses or thyromegaly. Lungs:  Clear throughout to auscultation.    Heart:  Regular rate and rhythm. Abdomen:  Soft, nontender and nondistended. Normal bowel sounds, without guarding, and without rebound.   Neurologic:  Alert and  oriented x4;  grossly normal neurologically.  Impression/Plan: Melinda Ford is here for an endoscopy and  colonoscopy to be performed for epigastric pain and history of colon polyps 11/01/2013  Risks, benefits, limitations, and alternatives regarding  endoscopy and colonoscopy have been reviewed with the patient.  Questions have been answered.  All parties agreeable.   Lucilla Lame, MD  10/10/2019, 9:27 AM

## 2019-10-13 ENCOUNTER — Encounter: Payer: Self-pay | Admitting: Gastroenterology

## 2019-10-13 ENCOUNTER — Other Ambulatory Visit: Payer: Self-pay | Admitting: Endocrinology

## 2019-10-13 LAB — SURGICAL PATHOLOGY

## 2019-10-14 ENCOUNTER — Encounter: Payer: Self-pay | Admitting: Gastroenterology

## 2019-10-14 LAB — PATHOLOGY

## 2019-10-20 ENCOUNTER — Other Ambulatory Visit: Payer: Self-pay

## 2019-10-20 MED ORDER — INSULIN ASPART 100 UNIT/ML ~~LOC~~ SOLN: SUBCUTANEOUS | 1 refills | Status: DC

## 2019-10-22 ENCOUNTER — Encounter: Payer: Self-pay | Admitting: Endocrinology

## 2019-10-22 ENCOUNTER — Other Ambulatory Visit: Payer: Self-pay

## 2019-10-22 ENCOUNTER — Ambulatory Visit (INDEPENDENT_AMBULATORY_CARE_PROVIDER_SITE_OTHER): Payer: PPO | Admitting: Endocrinology

## 2019-10-22 VITALS — BP 122/74 | HR 64 | Ht 62.0 in | Wt 154.6 lb

## 2019-10-22 DIAGNOSIS — M810 Age-related osteoporosis without current pathological fracture: Secondary | ICD-10-CM

## 2019-10-22 DIAGNOSIS — E118 Type 2 diabetes mellitus with unspecified complications: Secondary | ICD-10-CM

## 2019-10-22 DIAGNOSIS — E7800 Pure hypercholesterolemia, unspecified: Secondary | ICD-10-CM

## 2019-10-22 LAB — COMPREHENSIVE METABOLIC PANEL
ALT: 17 U/L (ref 0–35)
AST: 19 U/L (ref 0–37)
Albumin: 4.1 g/dL (ref 3.5–5.2)
Alkaline Phosphatase: 69 U/L (ref 39–117)
BUN: 21 mg/dL (ref 6–23)
CO2: 28 mEq/L (ref 19–32)
Calcium: 10.2 mg/dL (ref 8.4–10.5)
Chloride: 104 mEq/L (ref 96–112)
Creatinine, Ser: 0.65 mg/dL (ref 0.40–1.20)
GFR: 107.49 mL/min (ref >60.00)
Glucose, Bld: 99 mg/dL (ref 70–99)
Potassium: 4.1 mEq/L (ref 3.5–5.1)
Sodium: 139 mEq/L (ref 135–145)
Total Bilirubin: 0.6 mg/dL (ref 0.2–1.2)
Total Protein: 7.4 g/dL (ref 6.0–8.3)

## 2019-10-22 LAB — LIPID PANEL
Cholesterol: 202 mg/dL — ABNORMAL HIGH (ref 0–200)
HDL: 80.1 mg/dL (ref >39.00)
LDL Cholesterol: 107 mg/dL — ABNORMAL HIGH (ref 0–99)
NonHDL: 121.6
Total CHOL/HDL Ratio: 3
Triglycerides: 72 mg/dL (ref 0.0–149.0)
VLDL: 14.4 mg/dL (ref 0.0–40.0)

## 2019-10-22 LAB — VITAMIN D 25 HYDROXY (VIT D DEFICIENCY, FRACTURES): VITD: 28.44 ng/mL — ABNORMAL LOW (ref 30.00–100.00)

## 2019-10-22 LAB — POCT GLYCOSYLATED HEMOGLOBIN (HGB A1C): Hemoglobin A1C: 7.3 % — AB (ref 4.0–5.6)

## 2019-10-22 NOTE — Patient Instructions (Addendum)
Change pump at bedtime daily  Stop Metformin at dinner   Click for all meals such as sandwiches  Check sugar 4x daily esp. After meals

## 2019-10-22 NOTE — Progress Notes (Signed)
Patient ID: Melinda Ford, female   DOB: 30-Nov-1945, 74 y.o.   MRN: [id number]            Reason for Appointment: Follow-up for Type 2 Diabetes   History of Present Illness:          Date of diagnosis of type 2 diabetes mellitus: ?  2004        Background history:   She previously had been on metformin and also Avandia initially Detailed records of her care are not available prior to 2013 or so She was tried on Januvia in 2016 but this was not effective and this was stopped in 5/70 She was started on insulin in 2/16 with small doses of Lantus insulin Her A1c has been consistently over 8% since about 2013  Recent history:   INSULIN regimen is:   V-go 20 unit pump since 07/11/79, boluses 2 clicks with meals     Non-insulin hypoglycemic drugs the patient is taking are: metformin ER 500 mg bid, Invokana 150   Her A1c is relatively better at 7.3 compared to 7.7 % Has been as low as 6.9   Current management, blood sugar patterns and problems identified:  She has not been using her freestyle libre consistently for various reasons and only active 55% of the time  HYPOGLYCEMIA is present sporadically after breakfast and dinnertime especially in the last week  In the previous week was having only occasional documented high sugars  As before she is still not consistent with changing her pump at the same time and sometimes will not change it till morning time, usually trying to change it at bedtime  Although she is still having relatively low readings with the version 2 compared to her fingerstick she does not think that there is as much difference  She is still getting alerted for low blood sugars with her freestyle libre with low target of 70  Also she thinks that she is sometimes late with bolusing and may bolus after eating although not too long after  She is not understanding the need to cover all carbohydrates with boluses and sometimes if she eats a sandwich in the morning she  will not bolus  Some of her high readings are related to eating desserts like ice cream without adequate coverage  Highest reading was on 7/17 after dinner, was nearly 350       Side effects from medications have been: nausea and diarrhea from 1000 MG metformin  Compliance with the medical regimen: fair  Data from freestyle libre version 2  CGM use % of time  55  2-week average/GV  131, was 161/ 44  Time in range       73%  % Time Above 180  12  % Time above 250 5  % Time Below 70  10     PRE-MEAL  6-8 AM Lunch Dinner Bedtime Overall  Glucose range:       Averages:  94   142  131  131   POST-MEAL PC Breakfast PC Lunch PC Dinner  Glucose range:     Averages:  150   176         Previous data:   CGM use % of time  62  Average and SD  161, GV 36  Time in range  62%  % Time Above 180  27  % Time above 250  7  % Time Below target  4    PRE-MEAL Fasting Lunch Dinner  Bedtime Overall  Glucose range:       Mean/median:  117  170  154     POST-MEAL PC Breakfast PC Lunch PC Dinner  Glucose range:     Mean/median:  178  189  214    Glycemic patterns summary: Blood sugars are above the target range on an average mid afternoon and late evening after 10 PM Sugars are between 10 PM-2 AM Lowest blood sugars are from 6-8 AM including some readings below 70   Self-care:   Meal times are:  Breakfast is at 10 AM, dinner 8, lunch 2-3 pm  Typical meal intake: Breakfast is toast, oatmeal, sometimes bacon, milk   Lunch: Sometimes hot dogs, fish sandwich Evening meal is a protein like chicken, steak, pork chop, corn, greens.           Dietician visit, most recent: 2013 CDE visit: 12/2015               Exercise:  walks up to 20 minutes almost daily  Weight history:  Wt Readings from Last 3 Encounters:  10/22/19 154 lb 9.6 oz (70.1 kg)  10/10/19 152 lb (68.9 kg)  10/01/19 154 lb (69.9 kg)    Glycemic control:   Lab Results  Component Value Date   HGBA1C 7.3 (A)  10/22/2019   HGBA1C 7.7 (A) 06/17/2019   HGBA1C 7.0 (A) 02/11/2019   Lab Results  Component Value Date   MICROALBUR <0.7 06/17/2019   LDLCALC 107 (H) 10/22/2019   CREATININE 0.65 10/22/2019   Lab Results  Component Value Date   MICRALBCREAT 1.2 06/17/2019    Other active problems: See review of systems   Allergies as of 10/22/2019      Reactions   Ace Inhibitors Cough   Wheat Bran Hives   Gramineae Pollens       Medication List       Accurate as of October 22, 2019  8:32 PM. If you have any questions, ask your nurse or doctor.        acetaminophen 500 MG tablet Commonly known as: TYLENOL Take 1,000 mg by mouth 2 (two) times daily. As needed   amLODipine 10 MG tablet Commonly known as: NORVASC TAKE ONE TABLET BY MOUTH ONCE DAILY   canagliflozin 300 MG Tabs tablet Commonly known as: Invokana Take 1 tablet (300 mg total) by mouth daily before breakfast.   FreeStyle Libre 14 Day Reader Kerrin Mo See admin instructions.   FreeStyle Libre 2 Sensor Misc APPLY ONE SENSOR TO BODY ONCE [id number] DAYS TO MONITOR BLOOD SUGAR.   GINKGO BILOBA COMPLEX PO Take by mouth.   glucose blood test strip Commonly known as: OneTouch Verio Use to test blood sugar 4 times daily   insulin aspart 100 UNIT/ML injection Commonly known as: novoLOG Use max of 78 units daily via insulin pump.   losartan 100 MG tablet Commonly known as: COZAAR TAKE ONE TABLET BY MOUTH ONCE DAILY   meloxicam 15 MG tablet Commonly known as: MOBIC TAKE 1 TABLET BY MOUTH ONCE DAILY AS NEEDED FOR PAIN (SEVERE PAIN)   metFORMIN 500 MG 24 hr tablet Commonly known as: GLUCOPHAGE-XR TAKE 1 TABLET BY MOUTH TWICE DAILY WITH A MEAL   NON FORMULARY CPAP @@ 9 cm H2O nightly   omeprazole 20 MG capsule Commonly known as: PRILOSEC Take 1 capsule (20 mg total) by mouth daily.   OneTouch Verio w/Device Kit 1 each by Does not apply route 4 (four) times daily.   polyethylene glycol-electrolytes 420  g  solution Commonly known as: NuLYTELY Drink one 8 oz every 20 mins until entire container is finished starting at 5:00pm on 10/09/19   pravastatin 20 MG tablet Commonly known as: PRAVACHOL Take 20 mg by mouth 3 (three) times a week. What changed: Another medication with the same name was removed. Continue taking this medication, and follow the directions you see here. Changed by: Elayne Snare, MD   V-Go 20 Kit Use 1 per day   Vitamin D 50 MCG (2000 UT) Caps Take 1 capsule (2,000 Units total) by mouth daily.       Allergies:  Allergies  Allergen Reactions  . Ace Inhibitors Cough  . Wheat Bran Hives  . Gramineae Pollens     Past Medical History:  Diagnosis Date  . Allergy   . Arthritis   . Cataract   . Diabetes mellitus without complication ([place])   . Hyperlipidemia   . Hypertension   . Motion sickness    planes  . OSA (obstructive sleep apnea) 08/23/2016   CPAP  . Osteoporosis   . Presence of dental prosthetic device    implants  . Radiculopathy of lumbosacral region 06/22/2016  . Right lateral epicondylitis 06/04/2017  . Right sided sciatica 03/09/2016  . Sleep apnea     Past Surgical History:  Procedure Laterality Date  . ABDOMINAL HYSTERECTOMY    . CATARACT EXTRACTION Right 2020  . COLONOSCOPY WITH PROPOFOL N/A 10/10/2019   Procedure: COLONOSCOPY WITH PROPOFOL;  Surgeon: Lucilla Lame, MD;  Location: [facility];  Service: Endoscopy;  Laterality: N/A;  . ESOPHAGOGASTRODUODENOSCOPY (EGD) WITH PROPOFOL N/A 10/10/2019   Procedure: ESOPHAGOGASTRODUODENOSCOPY (EGD) WITH PROPOFOL;  Surgeon: Lucilla Lame, MD;  Location: [facility];  Service: Endoscopy;  Laterality: N/A;  Diabetic - insulin pump and oral meds sleep apnea  . POLYPECTOMY  10/10/2019   Procedure: POLYPECTOMY;  Surgeon: Lucilla Lame, MD;  Location: [facility];  Service: Endoscopy;;    Family History  Problem Relation Age of Onset  . Breast cancer Other 40  . Alcohol abuse Father   .  Mental illness Father   . Stroke Father   . Diabetes Sister   . Asthma Brother   . Diabetes Brother   . Diabetes Maternal Aunt   . Heart disease Maternal Aunt   . Diabetes Maternal Uncle   . Early death Maternal Uncle   . Breast cancer Cousin 58       mat cousin    Social History:  reports that she has never smoked. She has never used smokeless tobacco. She reports that she does not drink alcohol and does not use drugs.   Review of Systems  Lipid history: She is is taking pravastatin as prescribed using 20 mg, tolerating this 3 days a week as recommended  Last LDL was over 100   Lab Results  Component Value Date   CHOL 202 (H) 10/22/2019   HDL 80.10 10/22/2019   LDLCALC 107 (H) 10/22/2019   TRIG 72.0 10/22/2019   CHOLHDL 3 10/22/2019           Hypertension: on treatment for several years, on amlodipine and losartan   Has been followed by PCP  BP Readings from Last 3 Encounters:  10/22/19 122/74  10/10/19 (!) 113/56  10/01/19 120/70   She takes meloxicam 15 mg for joint pains 2 or 3 times a week   Most recent foot exam: 2/20  OSTEOPOROSIS: Followed by PCP, lowest T score was- 2.6 at the spine  compared to -3.0 before She thinks that some point she had previously taking alendronate Also has had longstanding vitamin D deficiency  Now taking 2000 units of vitamin D3 but no recent labs available  Lab Results  Component Value Date   VD25OH 28.44 (L) 10/22/2019   VD25OH 29.1 (L) 04/20/2017   VD25OH 24.92 (L) 05/18/2016   VD25OH 31.3 07/28/2015   VD25OH 26.9 (L) 03/17/2015   VD25OH 21.5 (L) 09/17/2014      Physical Examination:  BP 122/74 (BP Location: Left Arm, Patient Position: Sitting, Cuff Size: Normal)   Pulse 64   Ht _0  (1.575 m)   Wt 154 lb 9.6 oz (70.1 kg)   SpO2 97%   BMI 28.28 kg/m   No ankle edema present    ASSESSMENT:  Diabetes type 2 on insulin  See history of present illness for detailed discussion of current diabetes  management, blood sugar patterns and problems identified  She is being treated with an insulin pump/V-go, Invokana and metformin  A1c is somewhat better at 7.3  She is still not able to manage her diabetes with the insulin pump as well as she can However her A1c is somewhat better Her main difficulties are still not changing the pump every 24 hours as directed Also not bolusing enough for some of her meals and this was discussed in detail Bolusing frequently after meals, possibly missing some boluses also Blood sugars are the highest when she is eating desserts Also not understanding the need to cover other meals with carbohydrate such as sandwiches She does not make appointments with the nurse educator for further follow-up as recommended previously  OSTEOPOROSIS: She still has osteoporosis with T score -2.6 and likely does need a course of bisphosphonate, not clear how long she took this before  Vitamin D deficiency needs to be reassessed   Hyperlipidemia: Needs follow-up, she is able to take pravastatin 3 days a week now   PLAN:    She will make sure she has a reminder on her refrigerator to have a bolus before starting to eat  Add extra 1-2 clicks when eating larger carbohydrate meals or desserts  Bolus for all sandwiches  Try to keep the sensor on consistently but needs to try and check her sugar at least before and after each meal and on waking up.  Reminded her that her [place] is quite inadequate currently because of inadequate monitoring  She will also benefit from adjusting her diet by following her postprandial readings  Continue Invokana unless renal function is abnormal  Increase exercise bike, walking or other exercise  Check chemistry panel, vitamin D and lipid panel today    Patient Instructions  Change pump at bedtime daily  Stop Metformin at dinner   Click for all meals such as sandwiches  Check sugar 4x daily esp. After meals       Elayne Snare 10/22/2019, 8:32 PM   Note: This office note was prepared with Dragon voice recognition system technology. Any transcriptional errors that result from this process are unintentional.

## 2019-10-27 ENCOUNTER — Other Ambulatory Visit: Payer: Self-pay | Admitting: Internal Medicine

## 2019-10-27 DIAGNOSIS — I10 Essential (primary) hypertension: Secondary | ICD-10-CM

## 2019-10-28 ENCOUNTER — Encounter: Payer: Self-pay | Admitting: *Deleted

## 2019-11-10 DIAGNOSIS — H6123 Impacted cerumen, bilateral: Secondary | ICD-10-CM | POA: Diagnosis not present

## 2019-11-10 DIAGNOSIS — H903 Sensorineural hearing loss, bilateral: Secondary | ICD-10-CM | POA: Diagnosis not present

## 2019-11-14 ENCOUNTER — Telehealth: Payer: Self-pay | Admitting: Internal Medicine

## 2019-11-14 ENCOUNTER — Telehealth: Payer: PPO | Admitting: Internal Medicine

## 2019-11-14 NOTE — Telephone Encounter (Signed)
Copied from [place] [id number]. Topic: General - Other >> Nov 14, 2019  8:42 AM Keene Breath wrote: Reason for CRM: Patient would like the nurse to call her regarding her appt. Being cancelled.  She did not realize that the doctor was cancelling her appt.  Please call patient to discuss at [phone]

## 2019-11-14 NOTE — Telephone Encounter (Signed)
Patient called to report she did not get the message about the canceled appointment- she was a little upset about that.  Patient states: She did go and get tested- negative for COVID. Patient had nausea,sweats, sore throat- patient was not seen for these symptoms. Patient reports she is doing some better this afternoon- she wants to follow up in office- she wants her physical appointment moved up.  Patient states she needs Rx sent to [facility]- needs CPAP Rx- please call and send clinical notes- possibly Dr Vicente Masson.  Please call [phone]  Can we please move physical appointment (9/7) up if possible- patient would like to talk about test result and follow up. She would like to review results in person.   Apologized to patient for the missed communication.

## 2019-11-14 NOTE — Telephone Encounter (Signed)
Pt can only be seen for either a physical or office visit pt can not be seen for both in one visit.  KP

## 2019-11-14 NOTE — Telephone Encounter (Signed)
Called pt left VM to call back. Pts appt was cancelled. We spoke with pts husband and informed him to take pt to urgent care with fever and also get tested for covid.  Will route result note to [facility] Nurse Triage for follow up when patient returns call to clinic.    KP

## 2019-11-14 NOTE — Telephone Encounter (Signed)
Please Advise.  KP

## 2019-11-14 NOTE — Telephone Encounter (Signed)
Pt wants CPE moved to 9/7 call pt to set up appt.  Thank you,  KP

## 2019-11-14 NOTE — Telephone Encounter (Signed)
There are not openings for CPE, pt was ok with keeping appt. She did mention wanting a new piece for her cpap machine? Also needs to discuss GI test results. Please advise if appt is needed.

## 2019-11-20 ENCOUNTER — Other Ambulatory Visit: Payer: Self-pay | Admitting: Endocrinology

## 2019-12-09 ENCOUNTER — Ambulatory Visit (INDEPENDENT_AMBULATORY_CARE_PROVIDER_SITE_OTHER): Payer: PPO | Admitting: Internal Medicine

## 2019-12-09 ENCOUNTER — Encounter: Payer: Self-pay | Admitting: Internal Medicine

## 2019-12-09 ENCOUNTER — Other Ambulatory Visit: Payer: Self-pay

## 2019-12-09 VITALS — BP 122/70 | HR 72 | Ht 62.0 in | Wt 154.0 lb

## 2019-12-09 DIAGNOSIS — I10 Essential (primary) hypertension: Secondary | ICD-10-CM

## 2019-12-09 DIAGNOSIS — E1169 Type 2 diabetes mellitus with other specified complication: Secondary | ICD-10-CM | POA: Diagnosis not present

## 2019-12-09 DIAGNOSIS — R079 Chest pain, unspecified: Secondary | ICD-10-CM

## 2019-12-09 DIAGNOSIS — R1013 Epigastric pain: Secondary | ICD-10-CM

## 2019-12-09 DIAGNOSIS — M810 Age-related osteoporosis without current pathological fracture: Secondary | ICD-10-CM

## 2019-12-09 DIAGNOSIS — N760 Acute vaginitis: Secondary | ICD-10-CM | POA: Diagnosis not present

## 2019-12-09 DIAGNOSIS — G4733 Obstructive sleep apnea (adult) (pediatric): Secondary | ICD-10-CM

## 2019-12-09 DIAGNOSIS — E785 Hyperlipidemia, unspecified: Secondary | ICD-10-CM

## 2019-12-09 DIAGNOSIS — E118 Type 2 diabetes mellitus with unspecified complications: Secondary | ICD-10-CM

## 2019-12-09 DIAGNOSIS — Z0000 Encounter for general adult medical examination without abnormal findings: Secondary | ICD-10-CM | POA: Diagnosis not present

## 2019-12-09 DIAGNOSIS — Z23 Encounter for immunization: Secondary | ICD-10-CM | POA: Diagnosis not present

## 2019-12-09 MED ORDER — FLUCONAZOLE 100 MG PO TABS: 100.0000 mg | ORAL_TABLET | Freq: Every day | ORAL | 0 refills | Status: AC

## 2019-12-09 MED ORDER — OMEPRAZOLE 20 MG PO CPDR: 20.0000 mg | DELAYED_RELEASE_CAPSULE | Freq: Every day | ORAL | 12 refills | Status: DC

## 2019-12-09 NOTE — Patient Instructions (Signed)
Schedule your annual Eye exam and DM eye exam.  Begin one baby aspirin daily.

## 2019-12-09 NOTE — Progress Notes (Signed)
Date:  12/09/2019   Name:  Melinda Ford   DOB:  04-Jul-1945   MRN:  [id number]   Chief Complaint: Annual Exam (No Breast Exam- declined. No pap- aged out. High Dose flu shot. Foot exam.)  Melinda Ford is a 74 y.o. female who presents today for her Complete Annual Exam. She feels well. She reports exercising - none. She reports she is sleeping well. Breast complaints - none- declined breast exam.  Mammogram: 07/2019 DEXA: 10/2019 osteoporosis of spine Pap smear: discontinued Colonoscopy: 10/2019 2 TA  Immunization History  Administered Date(s) Administered  . Influenza, High Dose Seasonal PF 01/25/2016, 01/22/2017, 01/18/2018  . Influenza,inj,Quad PF,6+ Mos 02/24/2014, 01/15/2015  . Influenza-Unspecified 01/09/2011, 12/15/2011  . Moderna SARS-COVID-2 Vaccination 07/03/2019, 08/02/2019  . Pneumococcal Conjugate-13 04/16/2014  . Pneumococcal Polysaccharide-23 05/06/2010  . Tdap 01/22/2017  . Zoster 10/25/2010  . Zoster Recombinat (Shingrix) 01/18/2018, 11/12/2018    Hypertension This is a chronic problem. The problem is controlled. Associated symptoms include chest pain. Pertinent negatives include no headaches, palpitations, peripheral edema, shortness of breath or sweats. Risk factors for coronary artery disease include diabetes mellitus and dyslipidemia. Past treatments include angiotensin blockers and calcium channel blockers. The current treatment provides significant improvement. There are no compliance problems.   Diabetes She presents for her follow-up diabetic visit. She has type 2 diabetes mellitus. Her disease course has been stable. Pertinent negatives for hypoglycemia include no dizziness, headaches, nervousness/anxiousness, sweats or tremors. Associated symptoms include chest pain. Pertinent negatives for diabetes include no fatigue, no polydipsia and no polyuria. Current diabetic treatment includes insulin injections and oral agent (dual therapy) (metformin and invokana).  An ACE inhibitor/angiotensin II receptor blocker is being taken.  Hyperlipidemia The problem is uncontrolled. Associated symptoms include chest pain. Pertinent negatives include no shortness of breath. Current antihyperlipidemic treatment includes statins (pravachol 3x/wk but supposed to increase to 5x/week). The current treatment provides mild improvement of lipids.  Chest Pain  This is a recurrent problem. The problem occurs every several days. The problem has been unchanged. The pain is present in the substernal region. The pain is mild. The quality of the pain is described as pressure. The pain does not radiate. Pertinent negatives include no abdominal pain, cough, dizziness, fever, headaches, palpitations, shortness of breath or vomiting. The pain is aggravated by nothing. Treatments tried: aspirin helps. The treatment provided significant relief.  Her past medical history is significant for hyperlipidemia and hypertension.  OSA - she is snoring and thinks that she needs to use it again.  She quit 4 months ago.  She mostly does not like the fit of the mask.  Lab Results  Component Value Date   CREATININE 0.65 10/22/2019   BUN 21 10/22/2019   NA 139 10/22/2019   K 4.1 10/22/2019   CL 104 10/22/2019   CO2 28 10/22/2019   Lab Results  Component Value Date   CHOL 202 (H) 10/22/2019   HDL 80.10 10/22/2019   LDLCALC 107 (H) 10/22/2019   TRIG 72.0 10/22/2019   CHOLHDL 3 10/22/2019   Lab Results  Component Value Date   TSH 1.540 05/17/2018   Lab Results  Component Value Date   HGBA1C 7.3 (A) 10/22/2019   Lab Results  Component Value Date   WBC 5.2 06/04/2019   HGB 14.7 06/04/2019   HCT 45.1 06/04/2019   MCV 92 06/04/2019   PLT 293 06/04/2019   Lab Results  Component Value Date   ALT 17 10/22/2019   AST  19 10/22/2019   ALKPHOS 69 10/22/2019   BILITOT 0.6 10/22/2019   Last vitamin D Lab Results  Component Value Date   VD25OH 28.44 (L) 10/22/2019      Review of  Systems  Constitutional: Negative for chills, fatigue and fever.  HENT: Negative for congestion, hearing loss, trouble swallowing and voice change.   Eyes: Negative for visual disturbance.  Respiratory: Negative for cough, chest tightness, shortness of breath and wheezing.   Cardiovascular: Positive for chest pain and leg swelling. Negative for palpitations.  Gastrointestinal: Negative for abdominal pain, constipation, diarrhea and vomiting.  Endocrine: Negative for polydipsia and polyuria.  Genitourinary: Negative for dysuria, frequency, genital sores, vaginal bleeding and vaginal discharge.       Vaginal itching   Musculoskeletal: Negative for arthralgias, gait problem and joint swelling.  Skin: Negative for color change and rash.  Neurological: Negative for dizziness, tremors, light-headedness and headaches.  Hematological: Negative for adenopathy. Does not bruise/bleed easily.  Psychiatric/Behavioral: Negative for dysphoric mood and sleep disturbance. The patient is not nervous/anxious.     Patient Active Problem List   Diagnosis Date Noted  . Personal history of colonic polyps   . Polyp of sigmoid colon   . Abdominal pain, epigastric   . Patient noncompliant with statin medication 09/12/2019  . Type II diabetes mellitus with complication ([place]) 78/58/8502  . Hyperlipidemia associated with type 2 diabetes mellitus ([place]) 05/17/2018  . 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 Chronic midline thoracic back pain 02/22/2018  . Tinea versicolor 09/20/2016  . OSA (obstructive sleep apnea) 08/23/2016  . Allergic rhinitis 07/13/2016  . Overweight (BMI 25.0-29.9) 10/01/2015  . Vitamin D deficiency 09/18/2014  . Onychomycosis 09/16/2014  . Degenerative disc disease, lumbar 09/16/2014  . Benign essential HTN 10/10/2010  . Osteoporosis 10/10/2010    Allergies  Allergen Reactions  . Ace Inhibitors Cough  . Wheat Bran Hives  . Gramineae Pollens     Past  Surgical History:  Procedure Laterality Date  . ABDOMINAL HYSTERECTOMY    . CATARACT EXTRACTION Right 2020  . COLONOSCOPY WITH PROPOFOL N/A 10/10/2019   Procedure: COLONOSCOPY WITH PROPOFOL;  Surgeon: Lucilla Lame, MD;  Location: [facility];  Service: Endoscopy;  Laterality: N/A;  . ESOPHAGOGASTRODUODENOSCOPY (EGD) WITH PROPOFOL N/A 10/10/2019   Procedure: ESOPHAGOGASTRODUODENOSCOPY (EGD) WITH PROPOFOL;  Surgeon: Lucilla Lame, MD;  Location: [facility];  Service: Endoscopy;  Laterality: N/A;  Diabetic - insulin pump and oral meds sleep apnea  . POLYPECTOMY  10/10/2019   Procedure: POLYPECTOMY;  Surgeon: Lucilla Lame, MD;  Location: [facility];  Service: Endoscopy;;    Social History   Tobacco Use  . Smoking status: Never Smoker  . Smokeless tobacco: Never Used  Vaping Use  . Vaping Use: Never used  Substance Use Topics  . Alcohol use: No    Alcohol/week: 1.0 standard drink    Types: 1 Standard drinks or equivalent per week  . Drug use: No     Medication list has been reviewed and updated.  Current Meds  Medication Sig  . acetaminophen (TYLENOL) 500 MG tablet Take 1,000 mg by mouth 2 (two) times daily. As needed  . amLODipine (NORVASC) 10 MG tablet Take 1 tablet by mouth once daily  . Blood Glucose Monitoring Suppl (ONETOUCH VERIO) w/Device KIT 1 each by Does not apply route 4 (four) times daily.  . canagliflozin (INVOKANA) 300 MG TABS tablet Take 1 tablet (300 mg total) by mouth daily before breakfast.  .  Cholecalciferol (VITAMIN D) 2000 UNITS CAPS Take 1 capsule (2,000 Units total) by mouth daily.  . Continuous Blood Gluc Receiver (FREESTYLE LIBRE 14 DAY READER) DEVI See admin instructions.  . Continuous Blood Gluc Sensor (FREESTYLE LIBRE 2 SENSOR) MISC APPLY ONE SENSOR TO BODY ONCE [id number] DAYS TO MONITOR BLOOD SUGAR.  Marland Kitchen GINKGO BILOBA COMPLEX PO Take by mouth.  Marland Kitchen glucose blood (ONETOUCH VERIO) test strip Use to test blood sugar 4 times daily  . insulin  aspart (NOVOLOG) 100 UNIT/ML injection Use max of 78 units daily via insulin pump.  . Insulin Disposable Pump (V-GO 20) KIT Use 1 per day  . losartan (COZAAR) 100 MG tablet Take 1 tablet by mouth once daily  . meloxicam (MOBIC) 15 MG tablet TAKE 1 TABLET BY MOUTH ONCE DAILY AS NEEDED FOR PAIN (SEVERE PAIN)  . metFORMIN (GLUCOPHAGE-XR) 500 MG 24 hr tablet TAKE 1 TABLET BY MOUTH TWICE DAILY WITH A MEAL  . NON FORMULARY CPAP @@ 9 cm H2O nightly  . omeprazole (PRILOSEC) 20 MG capsule Take 1 capsule (20 mg total) by mouth daily.  . polyethylene glycol-electrolytes (NULYTELY) 420 g solution Drink one 8 oz every 20 mins until entire container is finished starting at 5:00pm on 10/09/19  . pravastatin (PRAVACHOL) 20 MG tablet Take 20 mg by mouth. Five times weekly.    PHQ 2/9 Scores 09/12/2019 08/18/2019 06/04/2019 11/27/2018  PHQ - 2 Score 0 0 4 1  PHQ- 9 Score 0 0 7 -    GAD 7 : Generalized Anxiety Score 09/12/2019 08/18/2019  Nervous, Anxious, on Edge 0 0  Control/stop worrying 0 0  Worry too much - different things 0 0  Trouble relaxing 0 0  Restless 0 0  Easily annoyed or irritable 0 0  Afraid - awful might happen 0 0  Total GAD 7 Score 0 0  Anxiety Difficulty Not difficult at all Not difficult at all    BP Readings from Last 3 Encounters:  12/09/19 122/70  10/22/19 122/74  10/10/19 (!) 113/56    Physical Exam Vitals and nursing note reviewed.  Constitutional:      General: She is not in acute distress.    Appearance: She is well-developed.  HENT:     Head: Normocephalic and atraumatic.     Right Ear: Tympanic membrane and ear canal normal.     Left Ear: Tympanic membrane and ear canal normal.     Nose:     Right Sinus: No maxillary sinus tenderness.     Left Sinus: No maxillary sinus tenderness.  Eyes:     General: No scleral icterus.       Right eye: No discharge.        Left eye: No discharge.     Conjunctiva/sclera: Conjunctivae normal.  Neck:     Thyroid: No thyromegaly.       Vascular: No carotid bruit.  Cardiovascular:     Rate and Rhythm: Normal rate and regular rhythm.     Pulses: Normal pulses.     Heart sounds: Normal heart sounds.  Pulmonary:     Effort: Pulmonary effort is normal. No respiratory distress.     Breath sounds: No wheezing.  Chest:     Comments: Breast exam declined Abdominal:     General: Bowel sounds are normal.     Palpations: Abdomen is soft.     Tenderness: There is no abdominal tenderness.  Genitourinary:    Comments: GU exam declined Musculoskeletal:     Cervical back:  Normal range of motion. No erythema.     Right lower leg: No edema.     Left lower leg: No edema.  Lymphadenopathy:     Cervical: No cervical adenopathy.  Skin:    General: Skin is warm and dry.     Findings: No rash.  Neurological:     Mental Status: She is alert and oriented to person, place, and time.     Cranial Nerves: No cranial nerve deficit.     Sensory: No sensory deficit.     Motor: Motor function is intact.     Coordination: Coordination is intact.     Deep Tendon Reflexes: Reflexes are normal and symmetric.  Psychiatric:        Attention and Perception: Attention normal.        Mood and Affect: Mood normal.     Wt Readings from Last 3 Encounters:  12/09/19 154 lb (69.9 kg)  10/22/19 154 lb 9.6 oz (70.1 kg)  10/10/19 152 lb (68.9 kg)    BP 122/70   Pulse 72   Ht _0  (1.575 m)   Wt 154 lb (69.9 kg)   SpO2 98%   BMI 28.17 kg/m   Assessment and Plan: 1. Annual physical exam Normal exam Continue annual mammograms  2. Benign essential HTN Clinically stable exam with well controlled BP. Tolerating medications without side effects at this time. Pt to continue current regimen and low sodium diet; benefits of regular exercise as able discussed.  3. Type II diabetes mellitus with complication ([place]) Clinically stable by exam and report without s/s of hypoglycemia. DM complicated by HTN and lipids. Tolerating medications well  without side effects or other concerns. Followed by Endocrinology Pt reminded to schedule DM eye exam  4. Hyperlipidemia associated with type 2 diabetes mellitus ([facility]) LDL not at goal Increase pravastatin to 5 times per week - may need to be on a high intensity statin if CAD is found  5. Age-related osteoporosis without current pathological fracture Continue calcium and vitamin D daily  6. OSA (obstructive sleep apnea) Will send order to [facility] for supplies Alternatively, she can order off of [employer]/internet  7. Chest pain, unspecified type Begin asa 81 mg daily Due to HTN and DM, will refer for further evaluation - EKG 12-Lead - SB @ 58 WNL - Ambulatory referral to Cardiology  8. Acute vaginitis Likely secondary to Invokana - with recurrent infections may need to stop SGLT-2 - fluconazole (DIFLUCAN) 100 MG tablet; Take 1 tablet (100 mg total) by mouth daily for 2 days. Skip 2 days in between the two doses  Dispense: 2 tablet; Refill: 0  9. Dyspepsia With small HH seen last month on EGD Continue daily PPI - omeprazole (PRILOSEC) 20 MG capsule; Take 1 capsule (20 mg total) by mouth daily.  Dispense: 30 capsule; Refill: 12   Partially dictated using Editor, commissioning. Any errors are unintentional.  Halina Maidens, MD [facility]  12/09/2019

## 2019-12-12 ENCOUNTER — Telehealth: Payer: Self-pay | Admitting: *Deleted

## 2019-12-12 ENCOUNTER — Other Ambulatory Visit: Payer: Self-pay | Admitting: *Deleted

## 2019-12-12 MED ORDER — METFORMIN HCL ER 500 MG PO TB24: 500.0000 mg | ORAL_TABLET | Freq: Every day | ORAL | 0 refills | Status: DC

## 2019-12-12 NOTE — Telephone Encounter (Signed)
Received fax from pharmacy for refill off Metformin 500. What is the current dosage?Once daily or BID? The notes indicate patient should STOP taking metformin at dinner. Please advise.

## 2019-12-12 NOTE — Telephone Encounter (Signed)
Metformin is once a day

## 2019-12-15 ENCOUNTER — Telehealth: Payer: Self-pay | Admitting: Internal Medicine

## 2019-12-15 DIAGNOSIS — Z794 Long term (current) use of insulin: Secondary | ICD-10-CM | POA: Diagnosis not present

## 2019-12-15 DIAGNOSIS — R0602 Shortness of breath: Secondary | ICD-10-CM | POA: Diagnosis not present

## 2019-12-15 DIAGNOSIS — R490 Dysphonia: Secondary | ICD-10-CM | POA: Diagnosis not present

## 2019-12-15 DIAGNOSIS — E7800 Pure hypercholesterolemia, unspecified: Secondary | ICD-10-CM | POA: Diagnosis not present

## 2019-12-15 DIAGNOSIS — I10 Essential (primary) hypertension: Secondary | ICD-10-CM | POA: Diagnosis not present

## 2019-12-15 DIAGNOSIS — R011 Cardiac murmur, unspecified: Secondary | ICD-10-CM | POA: Diagnosis not present

## 2019-12-15 DIAGNOSIS — G4733 Obstructive sleep apnea (adult) (pediatric): Secondary | ICD-10-CM | POA: Diagnosis not present

## 2019-12-15 DIAGNOSIS — E1165 Type 2 diabetes mellitus with hyperglycemia: Secondary | ICD-10-CM | POA: Diagnosis not present

## 2019-12-15 DIAGNOSIS — I208 Other forms of angina pectoris: Secondary | ICD-10-CM | POA: Diagnosis not present

## 2019-12-15 NOTE — Telephone Encounter (Signed)
Left message with household member for patient to call back and schedule Medicare Annual Wellness Visit (AWV) either virtually/audio only or in office. Whichever the patients preference is.  Last AWV 05/15/18; please schedule at anytime with [facility] Health Advisor.  This should be a 40 minute visit.

## 2019-12-17 ENCOUNTER — Telehealth: Payer: Self-pay | Admitting: Internal Medicine

## 2019-12-17 NOTE — Telephone Encounter (Unsigned)
Copied from [place] [id number]. Topic: General - Call Back - No Documentation >> Dec 17, 2019 12:44 PM Erick Blinks wrote: Reason for CRM: Pt would like a call back from [facility], regarding calcium gel. Needs help finding it   Best contact: [phone]

## 2019-12-17 NOTE — Telephone Encounter (Signed)
I tried to call pt- she has a Educational psychologist box that has not been set up yet" and she did not answer.

## 2019-12-21 ENCOUNTER — Other Ambulatory Visit: Payer: Self-pay | Admitting: Endocrinology

## 2019-12-22 ENCOUNTER — Other Ambulatory Visit: Payer: Self-pay | Admitting: Endocrinology

## 2019-12-31 DIAGNOSIS — M7671 Peroneal tendinitis, right leg: Secondary | ICD-10-CM | POA: Diagnosis not present

## 2019-12-31 DIAGNOSIS — M79675 Pain in left toe(s): Secondary | ICD-10-CM | POA: Diagnosis not present

## 2019-12-31 DIAGNOSIS — M79674 Pain in right toe(s): Secondary | ICD-10-CM | POA: Diagnosis not present

## 2019-12-31 DIAGNOSIS — E1142 Type 2 diabetes mellitus with diabetic polyneuropathy: Secondary | ICD-10-CM | POA: Diagnosis not present

## 2019-12-31 DIAGNOSIS — B351 Tinea unguium: Secondary | ICD-10-CM | POA: Diagnosis not present

## 2020-01-06 ENCOUNTER — Telehealth: Payer: Self-pay

## 2020-01-06 NOTE — Telephone Encounter (Signed)
Noted. Will completed and call patient for pick up when its ready.  CM

## 2020-01-06 NOTE — Telephone Encounter (Unsigned)
Copied from [place] [phone]. Topic: General - Other >> Jan 06, 2020  1:21 PM Oneta Rack wrote: Reason for CRM Patient wanted to inform PCP she will drop off her renewal handicap sticker form off,  which  expires 03/02/2020.

## 2020-01-21 DIAGNOSIS — R011 Cardiac murmur, unspecified: Secondary | ICD-10-CM | POA: Diagnosis not present

## 2020-01-21 DIAGNOSIS — I208 Other forms of angina pectoris: Secondary | ICD-10-CM | POA: Diagnosis not present

## 2020-01-27 ENCOUNTER — Ambulatory Visit: Payer: PPO | Admitting: Endocrinology

## 2020-01-28 DIAGNOSIS — R0602 Shortness of breath: Secondary | ICD-10-CM | POA: Diagnosis not present

## 2020-01-28 DIAGNOSIS — E1165 Type 2 diabetes mellitus with hyperglycemia: Secondary | ICD-10-CM | POA: Diagnosis not present

## 2020-01-28 DIAGNOSIS — Z794 Long term (current) use of insulin: Secondary | ICD-10-CM | POA: Diagnosis not present

## 2020-01-28 DIAGNOSIS — I208 Other forms of angina pectoris: Secondary | ICD-10-CM | POA: Diagnosis not present

## 2020-01-28 DIAGNOSIS — E7800 Pure hypercholesterolemia, unspecified: Secondary | ICD-10-CM | POA: Diagnosis not present

## 2020-01-28 DIAGNOSIS — R011 Cardiac murmur, unspecified: Secondary | ICD-10-CM | POA: Diagnosis not present

## 2020-01-28 DIAGNOSIS — G4733 Obstructive sleep apnea (adult) (pediatric): Secondary | ICD-10-CM | POA: Diagnosis not present

## 2020-01-28 DIAGNOSIS — I10 Essential (primary) hypertension: Secondary | ICD-10-CM | POA: Diagnosis not present

## 2020-02-12 ENCOUNTER — Ambulatory Visit: Payer: PPO | Admitting: Endocrinology

## 2020-02-13 ENCOUNTER — Other Ambulatory Visit: Payer: Self-pay | Admitting: *Deleted

## 2020-02-13 MED ORDER — METFORMIN HCL ER 500 MG PO TB24: 500.0000 mg | ORAL_TABLET | Freq: Every day | ORAL | 0 refills | Status: DC

## 2020-02-16 ENCOUNTER — Telehealth: Payer: Self-pay | Admitting: Dietician

## 2020-02-16 ENCOUNTER — Telehealth: Payer: Self-pay | Admitting: Internal Medicine

## 2020-02-16 NOTE — Telephone Encounter (Signed)
Spoke to patient, will contact [facility]

## 2020-02-16 NOTE — Telephone Encounter (Unsigned)
Copied from [place] [id number]. Topic: General - Other >> Feb 16, 2020  8:52 AM Celene Kras wrote: Reason for CRM: Pt called stating that she needs to speak with PCP. She states that her CPAP machine is not working. She is requesting to have a prescription for another one and cant remember who she used before. Please advise.

## 2020-02-17 ENCOUNTER — Encounter: Payer: Self-pay | Admitting: Endocrinology

## 2020-02-17 ENCOUNTER — Encounter: Payer: PPO | Attending: Endocrinology | Admitting: Nutrition

## 2020-02-17 ENCOUNTER — Ambulatory Visit (INDEPENDENT_AMBULATORY_CARE_PROVIDER_SITE_OTHER): Payer: PPO | Admitting: Endocrinology

## 2020-02-17 ENCOUNTER — Other Ambulatory Visit: Payer: Self-pay

## 2020-02-17 ENCOUNTER — Encounter: Payer: PPO | Admitting: Nutrition

## 2020-02-17 VITALS — BP 130/80 | HR 71 | Ht 63.0 in | Wt 160.8 lb

## 2020-02-17 DIAGNOSIS — E118 Type 2 diabetes mellitus with unspecified complications: Secondary | ICD-10-CM

## 2020-02-17 DIAGNOSIS — E785 Hyperlipidemia, unspecified: Secondary | ICD-10-CM | POA: Diagnosis not present

## 2020-02-17 DIAGNOSIS — M810 Age-related osteoporosis without current pathological fracture: Secondary | ICD-10-CM

## 2020-02-17 DIAGNOSIS — E1169 Type 2 diabetes mellitus with other specified complication: Secondary | ICD-10-CM | POA: Diagnosis not present

## 2020-02-17 DIAGNOSIS — E559 Vitamin D deficiency, unspecified: Secondary | ICD-10-CM | POA: Diagnosis not present

## 2020-02-17 LAB — COMPREHENSIVE METABOLIC PANEL
ALT: 24 U/L (ref 0–35)
AST: 21 U/L (ref 0–37)
Albumin: 3.8 g/dL (ref 3.5–5.2)
Alkaline Phosphatase: 66 U/L (ref 39–117)
BUN: 24 mg/dL — ABNORMAL HIGH (ref 6–23)
CO2: 26 mEq/L (ref 19–32)
Calcium: 9.7 mg/dL (ref 8.4–10.5)
Chloride: 106 mEq/L (ref 96–112)
Creatinine, Ser: 0.92 mg/dL (ref 0.40–1.20)
GFR: 60.95 mL/min (ref >60.00)
Glucose, Bld: 169 mg/dL — ABNORMAL HIGH (ref 70–99)
Potassium: 4.1 mEq/L (ref 3.5–5.1)
Sodium: 136 mEq/L (ref 135–145)
Total Bilirubin: 0.4 mg/dL (ref 0.2–1.2)
Total Protein: 6.9 g/dL (ref 6.0–8.3)

## 2020-02-17 LAB — LIPID PANEL
Cholesterol: 180 mg/dL (ref 0–200)
HDL: 74.7 mg/dL (ref >39.00)
LDL Cholesterol: 92 mg/dL (ref 0–99)
NonHDL: 105.44
Total CHOL/HDL Ratio: 2
Triglycerides: 65 mg/dL (ref 0.0–149.0)
VLDL: 13 mg/dL (ref 0.0–40.0)

## 2020-02-17 LAB — POCT GLYCOSYLATED HEMOGLOBIN (HGB A1C): Hemoglobin A1C: 7.2 % — AB (ref 4.0–5.6)

## 2020-02-17 LAB — VITAMIN D 25 HYDROXY (VIT D DEFICIENCY, FRACTURES): VITD: 40.7 ng/mL (ref 30.00–100.00)

## 2020-02-17 NOTE — Progress Notes (Signed)
Duplicate note.  Pt. Was on my schedule today X2.

## 2020-02-17 NOTE — Progress Notes (Signed)
Patient ID: Melinda Ford, female   DOB: 04-23-1945, 74 y.o.   MRN: [id number]            Reason for Appointment: Follow-up for Type 2 Diabetes   History of Present Illness:          Date of diagnosis of type 2 diabetes mellitus: ?  2004        Background history:   Melinda Ford previously had been on metformin and also Avandia initially Detailed records of her care are not available prior to 2013 or so Melinda Ford was tried on Januvia in 2016 but this was not effective and this was stopped in 5/70 Melinda Ford was started on insulin in 2/16 with small doses of Lantus insulin Her A1c has been consistently over 8% since about 2013  Recent history:   INSULIN regimen is:   V-go 20 unit pump since 9/32/67, boluses 2 clicks with meals     Non-insulin hypoglycemic drugs the patient is taking are: metformin ER 500 mg bid, Invokana none for 1 month   Her A1c is 7.2  Current management, blood sugar patterns and problems identified:  Melinda Ford has not been consistent with managing her pump because of family issues  Melinda Ford is still not changing the pump every 24 hours consistently and also frequently appears to be forgetting to bolus for meals  Melinda Ford stopped her Invokana about a month ago because of cost  Her average blood sugar recently is higher than before, most of it is postprandial  Melinda Ford is still snacking small amounts of carbohydrates throughout the day but not taking boluses for this  Melinda Ford is trying to go to the gym for exercise classes twice a week  However has gained 6 pounds  The following are results from interpretation of the freestyle libre  Melinda Ford has not compared her freestyle libre to fingersticks lately  Overall glycemic patterns indicate low normal sugars around 6-8 AM and then usually progressive increase in blood sugars especially postprandial in the afternoon and evening until late at night  Hyperglycemia is occurring mostly after meals either around noontime or evening with HIGHEST sugars late  evening  Hypoglycemia is mild and usually transient with blood sugars in the 60s between about 5 AM-10 AM  Overnight blood sugars are slowly declining and are the lowest around 7 AM  Postprandial readings as above and not always having data available for interpretation of postprandial patterns         Side effects from medications have been: nausea and diarrhea from 1000 MG metformin  Compliance with the medical regimen: fair  Data from freestyle libre version 2  CGM use % of time  63  2-week average/SD  151  Time in range  68       %  % Time Above 180  29  % Time above 250   % Time Below 70 3     PRE-MEAL Fasting Lunch Dinner Bedtime Overall  Glucose range:       Averages:  92  165  171  196 151   POST-MEAL PC Breakfast PC Lunch PC Dinner  Glucose range:     Averages:  151  193  205   Previous data:   CGM use % of time  55  2-week average/GV  131, was 161/ 44  Time in range       73%  % Time Above 180  12  % Time above 250 5  % Time Below 70  10  PRE-MEAL  6-8 AM Lunch Dinner Bedtime Overall  Glucose range:       Averages:  94   142  131  131   POST-MEAL PC Breakfast PC Lunch PC Dinner  Glucose range:     Averages:  150   176           Self-care:   Meal times are:  Breakfast is at 10 AM, dinner 8, lunch 2-3 pm  Typical meal intake: Breakfast is toast, oatmeal, sometimes bacon, milk   Lunch: Sometimes hot dogs, fish sandwich Evening meal is a protein like chicken, steak, pork chop, corn, greens.           Dietician visit, most recent: 2013 CDE visit: 12/2015   Weight history:  Wt Readings from Last 3 Encounters:  02/17/20 160 lb 12.8 oz (72.9 kg)  12/09/19 154 lb (69.9 kg)  10/22/19 154 lb 9.6 oz (70.1 kg)    Glycemic control:   Lab Results  Component Value Date   HGBA1C 7.2 (A) 02/17/2020   HGBA1C 7.3 (A) 10/22/2019   HGBA1C 7.7 (A) 06/17/2019   Lab Results  Component Value Date   MICROALBUR <0.7 06/17/2019   LDLCALC 107 (H)  10/22/2019   CREATININE 0.65 10/22/2019   Lab Results  Component Value Date   MICRALBCREAT 1.2 06/17/2019    Other active problems: See review of systems   Allergies as of 02/17/2020      Reactions   Ace Inhibitors Cough   Wheat Bran Hives   Gramineae Pollens       Medication List       Accurate as of February 17, 2020  1:28 PM. If you have any questions, ask your nurse or doctor.        acetaminophen 500 MG tablet Commonly known as: TYLENOL Take 1,000 mg by mouth 2 (two) times daily. As needed   amLODipine 10 MG tablet Commonly known as: NORVASC Take 1 tablet by mouth once daily   canagliflozin 300 MG Tabs tablet Commonly known as: Invokana Take 1 tablet (300 mg total) by mouth daily before breakfast.   FreeStyle Libre 14 Day Reader Kerrin Mo See admin instructions.   FreeStyle Libre 2 Sensor Misc APPLY ONE SENSOR TO BODY ONCE [id number] DAYS TO MONITOR BLOOD SUGAR.   GINKGO BILOBA COMPLEX PO Take by mouth.   glucose blood test strip Commonly known as: OneTouch Verio Use to test blood sugar 4 times daily   insulin aspart 100 UNIT/ML injection Commonly known as: novoLOG Use max of 78 units daily via insulin pump.   losartan 100 MG tablet Commonly known as: COZAAR Take 1 tablet by mouth once daily   meloxicam 15 MG tablet Commonly known as: MOBIC TAKE 1 TABLET BY MOUTH ONCE DAILY AS NEEDED FOR PAIN (SEVERE PAIN)   metFORMIN 500 MG 24 hr tablet Commonly known as: GLUCOPHAGE-XR Take 1 tablet (500 mg total) by mouth daily. Take one tablet daily with a meal   NON FORMULARY CPAP @@ 9 cm H2O nightly   omeprazole 20 MG capsule Commonly known as: PRILOSEC Take 1 capsule (20 mg total) by mouth daily.   OneTouch Verio w/Device Kit 1 each by Does not apply route 4 (four) times daily.   polyethylene glycol-electrolytes 420 g solution Commonly known as: NuLYTELY Drink one 8 oz every 20 mins until entire container is finished starting at 5:00pm on 10/09/19    pravastatin 20 MG tablet Commonly known as: PRAVACHOL Take 20 mg by mouth. three times weekly.  V-Go 20 Kit 1  ONCE DAILY   Vitamin D 50 MCG (2000 UT) Caps Take 1 capsule (2,000 Units total) by mouth daily.       Allergies:  Allergies  Allergen Reactions  . Ace Inhibitors Cough  . Wheat Bran Hives  . Gramineae Pollens     Past Medical History:  Diagnosis Date  . Allergy   . Arthritis   . Cataract   . Diabetes mellitus without complication ([place])   . Hyperlipidemia   . Hypertension   . Motion sickness    planes  . OSA (obstructive sleep apnea) 08/23/2016   CPAP  . Osteoporosis   . Presence of dental prosthetic device    implants  . Radiculopathy of lumbosacral region 06/22/2016  . Right lateral epicondylitis 06/04/2017  . Right sided sciatica 03/09/2016  . Sleep apnea     Past Surgical History:  Procedure Laterality Date  . ABDOMINAL HYSTERECTOMY    . CATARACT EXTRACTION Right 2020  . COLONOSCOPY WITH PROPOFOL N/A 10/10/2019   Procedure: COLONOSCOPY WITH PROPOFOL;  Surgeon: Lucilla Lame, MD;  Location: [facility];  Service: Endoscopy;  Laterality: N/A;  . ESOPHAGOGASTRODUODENOSCOPY (EGD) WITH PROPOFOL N/A 10/10/2019   Procedure: ESOPHAGOGASTRODUODENOSCOPY (EGD) WITH PROPOFOL;  Surgeon: Lucilla Lame, MD;  Location: [facility];  Service: Endoscopy;  Laterality: N/A;  Diabetic - insulin pump and oral meds sleep apnea  . POLYPECTOMY  10/10/2019   Procedure: POLYPECTOMY;  Surgeon: Lucilla Lame, MD;  Location: [facility];  Service: Endoscopy;;    Family History  Problem Relation Age of Onset  . Breast cancer Other 40  . Alcohol abuse Father   . Mental illness Father   . Stroke Father   . Diabetes Sister   . Asthma Brother   . Diabetes Brother   . Diabetes Maternal Aunt   . Heart disease Maternal Aunt   . Diabetes Maternal Uncle   . Early death Maternal Uncle   . Breast cancer Cousin 77       mat cousin    Social History:  reports that  Melinda Ford has never smoked. Melinda Ford has never used smokeless tobacco. Melinda Ford reports that Melinda Ford does not drink alcohol and does not use drugs.   Review of Systems  Lipid history: Melinda Ford is is taking pravastatin as prescribed using 20 mg, tolerating this 3 days a week as recommended  Last LDL was over 100   Lab Results  Component Value Date   CHOL 202 (H) 10/22/2019   HDL 80.10 10/22/2019   LDLCALC 107 (H) 10/22/2019   TRIG 72.0 10/22/2019   CHOLHDL 3 10/22/2019           Hypertension: on treatment for several years, on amlodipine and losartan   Has been followed by PCP  BP Readings from Last 3 Encounters:  02/17/20 130/80  12/09/19 122/70  10/22/19 122/74   Melinda Ford takes meloxicam 15 mg for joint pains 2 or 3 times a week  Most recent foot exam: 2/20  OSTEOPOROSIS: Followed by PCP, lowest T score was- 2.6 at the spine compared to -3.0 before Melinda Ford was previously taking alendronate Also has had longstanding vitamin D deficiency  Now taking 4000 units of vitamin D3   Lab Results  Component Value Date   VD25OH 28.44 (L) 10/22/2019   VD25OH 29.1 (L) 04/20/2017   VD25OH 24.92 (L) 05/18/2016   VD25OH 31.3 07/28/2015   VD25OH 26.9 (L) 03/17/2015   VD25OH 21.5 (L) 09/17/2014      Physical Examination:  BP 130/80   Pulse 71   Ht _0  (1.6 m)   Wt 160 lb 12.8 oz (72.9 kg)   SpO2 99%   BMI 28.48 kg/m        ASSESSMENT:  Diabetes type 2 on insulin  See history of present illness for detailed discussion of current diabetes management, blood sugar patterns and problems identified  Melinda Ford is being treated with an insulin pump/V-go, Invokana and metformin  A1c is somewhat better at 7.2  Her blood sugars are recently averaging 150 but has significantly high readings after meals occasionally well over 200 but also low normal early morning Most of her difficulties with postprandial hyperglycemia and this is likely worse from her stopping Invokana for cost reasons Melinda Ford has difficulty  managing her pump with not timing of boluses properly are missing them altogether Also may not always change her pump at the same time every day Likely needs 6 units instead of 4 units for most of her meals unless eating small amounts of carbohydrates  Vitamin D deficiency: Her level needs to be reassessed on the higher dose  Hyperlipidemia: Continue pravastatin 3 days a week   PLAN:    Melinda Ford will try to do 3 clicks for most of her meals and 1 click for snacks  Discussed postprandial targets  Melinda Ford will do fingerstick glucose before trying to treat low normal sugars on her Elenor Legato  Melinda Ford can also change the alarm to 60 instead of 70 to avoid false alarms on low sugars  Melinda Ford needs to change her pump every 24 hours consistently and set a time to do it  Stop Metformin as this will help her overnight low sugars, may need 500 mg only if morning sugars are significantly higher  May take extra 1 click for blood sugars over 180 Premeal  Labs to be checked today    Patient Instructions  3 clicks for meals unless small meal or less Carbs  Change pump every 24 hrs  Check fingersticks also  May set alarm for 60  Stop Metformin for now        Elayne Snare 02/17/2020, 1:28 PM   Note: This office note was prepared with Dragon voice recognition system technology. Any transcriptional errors that result from this process are unintentional.

## 2020-02-17 NOTE — Patient Instructions (Signed)
Scan sensor at before meals and at bedtime.  Also first thing in the morning when you awaken.

## 2020-02-17 NOTE — Patient Instructions (Addendum)
3 clicks for meals unless small meal or less Carbs  Change pump every 24 hrs  Check fingersticks also  May set alarm for 60  Stop Metformin for now

## 2020-02-17 NOTE — Progress Notes (Signed)
Patient had set a timer for 11pm, as a reminder to scan her [place] sensor, and did not know how to stop it.  Also she requested times set to help her remember to scan her Elenor Legato for:  9AM, 2PM and 6PM, and 9PM--ac and HS.  This was done.  She also wanted the low alert lowered to 65. I advised against this, but said she did not want it going off at night.  I told her Dr. Dwyane Dee decreased her oral medications that would prevent her from dropping low, but she insisted that it be set to 65.  She had no final requests.

## 2020-02-23 ENCOUNTER — Telehealth: Payer: Self-pay | Admitting: Endocrinology

## 2020-02-23 NOTE — Telephone Encounter (Signed)
Patient returned call and requests to be called at ph# [phone]

## 2020-02-23 NOTE — Telephone Encounter (Signed)
Results given to patient, labs mailed per patient request

## 2020-03-05 ENCOUNTER — Telehealth: Payer: Self-pay

## 2020-03-05 ENCOUNTER — Other Ambulatory Visit: Payer: Self-pay | Admitting: Endocrinology

## 2020-03-05 ENCOUNTER — Other Ambulatory Visit: Payer: Self-pay

## 2020-03-05 ENCOUNTER — Telehealth: Payer: Self-pay | Admitting: Internal Medicine

## 2020-03-05 MED ORDER — FREESTYLE LIBRE 2 SENSOR MISC
0 refills | Status: DC
Start: 2020-03-05 — End: 2020-03-09

## 2020-03-05 NOTE — Telephone Encounter (Signed)
RX sent to pharmacy  

## 2020-03-05 NOTE — Telephone Encounter (Signed)
Pt called stating that she is completely out of her [place] 2 sensors. She states that the other provider is out of the office already and is requesting to see if PCP can send this in for her. Please advise.      [facility] [place] [facility] [address]  [place] [address]  Phone: [phone] Fax: [phone]  Hours: Not open 24 hours

## 2020-03-05 NOTE — Telephone Encounter (Signed)
Patient called in and is out of he diabetic senor's     Continuous Blood Gluc Sensor APPLY ONE SENSOR TO BODY ONCE [id number] DAYS TO MONITOR BLOOD SUGAR.   Continuous Blood Gluc Sensor APPLY 1 SENSOR TO BODY ONCE EVERY 14 DAYS   Continuous Blood Gluc Sensor APPLY 1 KIT EVERY 14 DAYS TO MONITOR BLOOD SUGAR      Patient is not going to be able to test at all this weekend

## 2020-03-09 ENCOUNTER — Other Ambulatory Visit: Payer: Self-pay

## 2020-03-09 ENCOUNTER — Ambulatory Visit
Admission: RE | Admit: 2020-03-09 | Discharge: 2020-03-09 | Disposition: A | Discharge status: Home / Self Care | Payer: PPO | Attending: Internal Medicine | Admitting: Internal Medicine

## 2020-03-09 ENCOUNTER — Ambulatory Visit
Admission: RE | Admit: 2020-03-09 | Discharge: 2020-03-09 | Disposition: A | Discharge status: Home / Self Care | Payer: PPO | Source: Ambulatory Visit | Attending: Internal Medicine | Admitting: Internal Medicine

## 2020-03-09 ENCOUNTER — Telehealth: Payer: Self-pay

## 2020-03-09 ENCOUNTER — Ambulatory Visit (INDEPENDENT_AMBULATORY_CARE_PROVIDER_SITE_OTHER): Payer: PPO | Admitting: Internal Medicine

## 2020-03-09 ENCOUNTER — Encounter: Payer: Self-pay | Admitting: Internal Medicine

## 2020-03-09 VITALS — BP 138/80 | HR 77 | Temp 98.2°F | Ht 62.0 in | Wt 159.0 lb

## 2020-03-09 DIAGNOSIS — M25561 Pain in right knee: Secondary | ICD-10-CM

## 2020-03-09 DIAGNOSIS — I10 Essential (primary) hypertension: Secondary | ICD-10-CM

## 2020-03-09 MED ORDER — AMLODIPINE BESYLATE 10 MG PO TABS: 10.0000 mg | ORAL_TABLET | Freq: Every day | ORAL | 3 refills | Status: DC

## 2020-03-09 NOTE — Progress Notes (Signed)
Date:  03/09/2020   Name:  Melinda Ford   DOB:  02/01/1946   MRN:  [id number]   Chief Complaint: Knee Pain (Rt knee pain after fall on 02/28/20 at [facility]. Constant pain, slightly swollen under the knee. )  Knee Pain  The incident occurred more than 1 week ago. The injury mechanism was a fall. The pain is present in the right knee. The pain is mild. Pertinent negatives include no loss of motion, loss of sensation, muscle weakness, numbness or tingling. She reports no foreign bodies present. The symptoms are aggravated by weight bearing. She has tried acetaminophen for the symptoms. The treatment provided mild relief.    Lab Results  Component Value Date   CREATININE 0.92 02/17/2020   BUN 24 (H) 02/17/2020   NA 136 02/17/2020   K 4.1 02/17/2020   CL 106 02/17/2020   CO2 26 02/17/2020   Lab Results  Component Value Date   CHOL 180 02/17/2020   HDL 74.70 02/17/2020   LDLCALC 92 02/17/2020   TRIG 65.0 02/17/2020   CHOLHDL 2 02/17/2020   Lab Results  Component Value Date   TSH 1.540 05/17/2018   Lab Results  Component Value Date   HGBA1C 7.2 (A) 02/17/2020   Lab Results  Component Value Date   WBC 5.2 06/04/2019   HGB 14.7 06/04/2019   HCT 45.1 06/04/2019   MCV 92 06/04/2019   PLT 293 06/04/2019   Lab Results  Component Value Date   ALT 24 02/17/2020   AST 21 02/17/2020   ALKPHOS 66 02/17/2020   BILITOT 0.4 02/17/2020     Review of Systems  Constitutional: Negative for chills and fatigue.  Respiratory: Negative for chest tightness and shortness of breath.   Cardiovascular: Negative for chest pain.  Musculoskeletal: Positive for arthralgias (right knee).  Neurological: Negative for tingling and numbness.    Patient Active Problem List   Diagnosis Date Noted  . Personal history of colonic polyps   . Polyp of sigmoid colon   . Abdominal pain, epigastric   . Patient noncompliant with statin medication 09/12/2019  . Type II diabetes mellitus  with complication ([place]) 46/27/0350  . Hyperlipidemia associated with type 2 diabetes mellitus ([place]) 05/17/2018  . 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 Chronic midline thoracic back pain 02/22/2018  . Tinea versicolor 09/20/2016  . OSA (obstructive sleep apnea) 08/23/2016  . Allergic rhinitis 07/13/2016  . Overweight (BMI 25.0-29.9) 10/01/2015  . Vitamin D deficiency 09/18/2014  . Onychomycosis 09/16/2014  . Degenerative disc disease, lumbar 09/16/2014  . Benign essential HTN 10/10/2010  . Osteoporosis 10/10/2010    Allergies  Allergen Reactions  . Ace Inhibitors Cough  . Wheat Bran Hives  . Gramineae Pollens     Past Surgical History:  Procedure Laterality Date  . ABDOMINAL HYSTERECTOMY    . CATARACT EXTRACTION Right 2020  . COLONOSCOPY WITH PROPOFOL N/A 10/10/2019   Procedure: COLONOSCOPY WITH PROPOFOL;  Surgeon: Lucilla Lame, MD;  Location: [facility];  Service: Endoscopy;  Laterality: N/A;  . ESOPHAGOGASTRODUODENOSCOPY (EGD) WITH PROPOFOL N/A 10/10/2019   Procedure: ESOPHAGOGASTRODUODENOSCOPY (EGD) WITH PROPOFOL;  Surgeon: Lucilla Lame, MD;  Location: [facility];  Service: Endoscopy;  Laterality: N/A;  Diabetic - insulin pump and oral meds sleep apnea  . POLYPECTOMY  10/10/2019   Procedure: POLYPECTOMY;  Surgeon: Lucilla Lame, MD;  Location: [facility];  Service: Endoscopy;;    Social History   Tobacco Use  .  Smoking status: Never Smoker  . Smokeless tobacco: Never Used  Vaping Use  . Vaping Use: Never used  Substance Use Topics  . Alcohol use: No    Alcohol/week: 1.0 standard drink    Types: 1 Standard drinks or equivalent per week  . Drug use: No     Medication list has been reviewed and updated.  Current Meds  Medication Sig  . acetaminophen (TYLENOL) 500 MG tablet Take 1,000 mg by mouth 2 (two) times daily. As needed  . amLODipine (NORVASC) 10 MG tablet Take 1 tablet by mouth once daily  . Blood  Glucose Monitoring Suppl (ONETOUCH VERIO) w/Device KIT 1 each by Does not apply route 4 (four) times daily.  . canagliflozin (INVOKANA) 300 MG TABS tablet Take 1 tablet (300 mg total) by mouth daily before breakfast.  . Cholecalciferol (VITAMIN D) 2000 UNITS CAPS Take 1 capsule (2,000 Units total) by mouth daily.  . Continuous Blood Gluc Receiver (FREESTYLE LIBRE 14 DAY READER) DEVI See admin instructions.  . Continuous Blood Gluc Sensor (FREESTYLE LIBRE 2 SENSOR) MISC APPLY ONE SENSOR TO BODY ONCE [id number] DAYS TO MONITOR BLOOD SUGAR.  Marland Kitchen Continuous Blood Gluc Sensor (FREESTYLE LIBRE 2 SENSOR) MISC APPLY 1 SENSOR TO BODY ONCE EVERY 14 DAYS  . GINKGO BILOBA COMPLEX PO Take by mouth.  Marland Kitchen glucose blood (ONETOUCH VERIO) test strip Use to test blood sugar 4 times daily  . insulin aspart (NOVOLOG) 100 UNIT/ML injection Use max of 78 units daily via insulin pump.  . Insulin Disposable Pump (V-GO 20) KIT 1  ONCE DAILY  . losartan (COZAAR) 100 MG tablet Take 1 tablet by mouth once daily  . meloxicam (MOBIC) 15 MG tablet TAKE 1 TABLET BY MOUTH ONCE DAILY AS NEEDED FOR PAIN (SEVERE PAIN)  . NON FORMULARY CPAP @@ 9 cm H2O nightly  . omeprazole (PRILOSEC) 20 MG capsule Take 1 capsule (20 mg total) by mouth daily.  . polyethylene glycol-electrolytes (NULYTELY) 420 g solution Drink one 8 oz every 20 mins until entire container is finished starting at 5:00pm on 10/09/19  . pravastatin (PRAVACHOL) 20 MG tablet Take 20 mg by mouth. three times weekly.    PHQ 2/9 Scores 03/09/2020 09/12/2019 08/18/2019 06/04/2019  PHQ - 2 Score 0 0 0 4  PHQ- 9 Score 0 0 0 7    GAD 7 : Generalized Anxiety Score 03/09/2020 09/12/2019 08/18/2019  Nervous, Anxious, on Edge 0 0 0  Control/stop worrying 0 0 0  Worry too much - different things 0 0 0  Trouble relaxing 0 0 0  Restless 0 0 0  Easily annoyed or irritable 0 0 0  Afraid - awful might happen 0 0 0  Total GAD 7 Score 0 0 0  Anxiety Difficulty Not difficult at all Not difficult  at all Not difficult at all    BP Readings from Last 3 Encounters:  03/09/20 140/80  02/17/20 130/80  12/09/19 122/70    Physical Exam Vitals and nursing note reviewed.  Constitutional:      General: She is not in acute distress.    Appearance: She is well-developed.  HENT:     Head: Normocephalic and atraumatic.  Pulmonary:     Effort: Pulmonary effort is normal. No respiratory distress.  Musculoskeletal:        General: Normal range of motion.     Right knee: Effusion (small effusion) present. No crepitus. Normal range of motion. Tenderness present over the medial joint line.     Left  knee: No effusion or crepitus. Normal range of motion.  Skin:    General: Skin is warm and dry.     Findings: Abrasion (below right knee) present. No rash.  Neurological:     Mental Status: She is alert and oriented to person, place, and time.  Psychiatric:        Behavior: Behavior normal.        Thought Content: Thought content normal.     Wt Readings from Last 3 Encounters:  03/09/20 159 lb (72.1 kg)  02/17/20 160 lb 12.8 oz (72.9 kg)  12/09/19 154 lb (69.9 kg)    BP 140/80   Pulse 77   Temp 98.2 F (36.8 C) (Oral)   Ht 5' 2"  (1.575 m)   Wt 159 lb (72.1 kg)   SpO2 98%   BMI 29.08 kg/m   Assessment and Plan: 1. Acute pain of right knee Appears to be minor but can not rule out meniscal injury Recommend Tylenol, ice and topical rubs - DG Knee Complete 4 Views Right; Future  2. Benign essential HTN Clinically stable exam with well controlled BP on amlodipine and losartan. Tolerating medications without side effects at this time. Pt to continue current regimen and low sodium diet; benefits of regular exercise as able discussed. - amLODipine (NORVASC) 10 MG tablet; Take 1 tablet (10 mg total) by mouth daily.  Dispense: 90 tablet; Refill: 3   Partially dictated using Editor, commissioning. Any errors are unintentional.  Halina Maidens, MD [facility]  03/09/2020

## 2020-03-09 NOTE — Telephone Encounter (Signed)
Pt went to get xray done. Spoke to Angie from X-ray she stated that pt has on a sensor and they cant take the xray while sensor is on. Pt stated that she just put the sensor on and does not want to take it off. Pt will take off in 10 days. Angie wanted to know what she should do about the sensor. Told Angie that its up to the pt if she wants to take the sensor off that pt can come back in 10 days to get her xray. Angie is going to talk to pt to see what she is going to do.  KP

## 2020-04-07 ENCOUNTER — Telehealth: Payer: Self-pay

## 2020-04-07 NOTE — Telephone Encounter (Unsigned)
Copied from CRM [id number]. Topic: General - Inquiry >> Apr 07, 2020  3:16 PM Lorayne Bender wrote: Reason for CRM:  Morrie Sheldon from [facility] calling.  States they have faxed over an Attestation Form that needs to be completed for this patient documenting if she has a confirmed diagnosis of heart failure or diabetes and a signature from the provider. Fax: [phone] Phone: [phone]

## 2020-04-08 NOTE — Telephone Encounter (Signed)
Received form. Dr. Judithann Graves is out of the office. She will sign the form and we will fax is back Monday when she returns to the office.

## 2020-04-12 ENCOUNTER — Other Ambulatory Visit: Payer: Self-pay | Admitting: *Deleted

## 2020-04-12 MED ORDER — INSULIN ASPART 100 UNIT/ML ~~LOC~~ SOLN
SUBCUTANEOUS | 1 refills | Status: DC
Start: 2020-04-12 — End: 2020-11-29

## 2020-04-13 ENCOUNTER — Telehealth: Payer: Self-pay | Admitting: Internal Medicine

## 2020-04-13 NOTE — Telephone Encounter (Signed)
Left message for patient to call back and schedule Medicare Annual Wellness Visit (AWV) either virtually or in office. Whichever the patients preference is.  Last AWV 05/15/18; please schedule at anytime with [facility]-Nurse Health Advisor.  This should be a 40 minute visit. 

## 2020-04-28 ENCOUNTER — Ambulatory Visit: Payer: PPO | Admitting: Endocrinology

## 2020-04-29 DIAGNOSIS — M79675 Pain in left toe(s): Secondary | ICD-10-CM | POA: Diagnosis not present

## 2020-04-29 DIAGNOSIS — E1142 Type 2 diabetes mellitus with diabetic polyneuropathy: Secondary | ICD-10-CM | POA: Diagnosis not present

## 2020-04-29 DIAGNOSIS — M79674 Pain in right toe(s): Secondary | ICD-10-CM | POA: Diagnosis not present

## 2020-04-29 DIAGNOSIS — B351 Tinea unguium: Secondary | ICD-10-CM | POA: Diagnosis not present

## 2020-05-21 ENCOUNTER — Ambulatory Visit: Payer: PPO | Admitting: Endocrinology

## 2020-06-01 ENCOUNTER — Ambulatory Visit: Payer: PPO | Admitting: Endocrinology

## 2020-06-08 ENCOUNTER — Other Ambulatory Visit: Payer: Self-pay

## 2020-06-08 MED ORDER — ONETOUCH VERIO VI STRP
ORAL_STRIP | 1 refills | Status: DC
Start: 2020-06-08 — End: 2021-02-10

## 2020-06-09 ENCOUNTER — Ambulatory Visit (INDEPENDENT_AMBULATORY_CARE_PROVIDER_SITE_OTHER): Payer: PPO | Admitting: Endocrinology

## 2020-06-09 ENCOUNTER — Other Ambulatory Visit: Payer: Self-pay

## 2020-06-09 ENCOUNTER — Encounter: Payer: Self-pay | Admitting: Endocrinology

## 2020-06-09 VITALS — BP 142/77 | HR 77 | Ht 62.0 in | Wt 150.2 lb

## 2020-06-09 DIAGNOSIS — I10 Essential (primary) hypertension: Secondary | ICD-10-CM | POA: Diagnosis not present

## 2020-06-09 DIAGNOSIS — E118 Type 2 diabetes mellitus with unspecified complications: Secondary | ICD-10-CM | POA: Diagnosis not present

## 2020-06-09 LAB — POCT GLYCOSYLATED HEMOGLOBIN (HGB A1C): Hemoglobin A1C: 8.1 % — AB (ref 4.0–5.6)

## 2020-06-09 MED ORDER — CANAGLIFLOZIN 300 MG PO TABS: 300.0000 mg | ORAL_TABLET | Freq: Every day | ORAL | 3 refills | Status: DC

## 2020-06-09 NOTE — Patient Instructions (Addendum)
Change pump every 24 hours at nite when going to bed  Bolus 2-3 clicks every time u see a meal  Check blood sugars on waking up 7 days a week  Also check blood sugars about 2 hours after meals and do this after different meals by rotation  Recommended blood sugar levels on waking up are 90-130 and about 2 hours after meal is 130-160  Please bring your blood sugar monitor to each visit, thank you

## 2020-06-09 NOTE — Progress Notes (Signed)
Patient ID: Melinda Ford, female   DOB: Jun 14, 1945, 75 y.o.   MRN: [id number]            Reason for Appointment: Follow-up for Type 2 Diabetes   History of Present Illness:          Date of diagnosis of type 2 diabetes mellitus: ?  2004        Background history:   She previously had been on metformin and also Avandia initially Detailed records of her care are not available prior to 2013 or so She was tried on Januvia in 2016 but this was not effective and this was stopped in 5/70 She was started on insulin in 2/16 with small doses of Lantus insulin Her A1c has been consistently over 8% since about 2013  Recent history:   INSULIN regimen is:   V-go 20 unit pump since 7/86/76, boluses 0-2 clicks with meals     Non-insulin hypoglycemic drugs the patient is taking are: None  Her A1c is 8.1 compared to 7.2  Current management, blood sugar patterns and problems identified:  She has not been consistent with checking her blood sugars with data available only for 34% of the last 2 weeks  Mostly checking blood sugars once a day and not every day  She thinks that because of still grieving over her father's death and stress she is frequently forgetting to bolus at mealtimes  Also does not understand how much she needs to bolus  Freestyle Elenor Legato shows that she has HYPERGLYCEMIC episodes at all different times of the day and night including late at night  Otherwise OVERNIGHT blood sugars are variable and occasionally may decline to near normal levels  Has had only one HYPOGLYCEMIC episode documented likely from bolusing when blood sugar was high at 2 in the morning  She does not understand the need to remember to bolus when she is eating a meal  She is still not changing the pump every 24 hours and will sometimes change it at bedtime or sometimes in the morning despite reminding her to do it consistently  However she has had less overnight HYPOGLYCEMIA with stopping her Metformin  and also frequently appears to be forgetting to bolus for meals  She has not taken Invokana since about October because of cost and does not understand why she was recommended this  She is trying to go to the gym for exercise classes twice a week  However has lost 9 pounds   PRE-MEAL Fasting Lunch Dinner Bedtime Overall  Glucose range:       Mean/median:  114  232  163  241  176   POST-MEAL PC Breakfast PC Lunch PC Dinner  Glucose range:     Mean/median:  202  198          Side effects from medications have been: nausea and diarrhea from 1000 MG metformin  Compliance with the medical regimen: fair  PREVIOUS data from freestyle libre version 2  CGM use % of time  63  2-week average/SD  151  Time in range  68       %  % Time Above 180  29  % Time above 250   % Time Below 70 3     PRE-MEAL Fasting Lunch Dinner Bedtime Overall  Glucose range:       Averages:  92  165  171  196 151   POST-MEAL PC Breakfast PC Lunch PC Dinner  Glucose range:  Averages:  151  193  205   Previous data:   CGM use % of time  55  2-week average/GV  131, was 161/ 44  Time in range       73%  % Time Above 180  12  % Time above 250 5  % Time Below 70  10     PRE-MEAL  6-8 AM Lunch Dinner Bedtime Overall  Glucose range:       Averages:  94   142  131  131   POST-MEAL PC Breakfast PC Lunch PC Dinner  Glucose range:     Averages:  150   176           Self-care:   Meal times are:  Breakfast is at 10 AM, dinner 8, lunch 2-3 pm  Typical meal intake: Breakfast is toast, oatmeal, sometimes bacon, milk   Lunch: Sometimes hot dogs, fish sandwich Evening meal is a protein like chicken, steak, pork chop, corn, greens.           Dietician visit, most recent: 2013 CDE visit: 12/2015   Weight history:  Wt Readings from Last 3 Encounters:  06/09/20 150 lb 3.2 oz (68.1 kg)  03/09/20 159 lb (72.1 kg)  02/17/20 160 lb 12.8 oz (72.9 kg)    Glycemic control:   Lab Results  Component  Value Date   HGBA1C 8.1 (A) 06/09/2020   HGBA1C 7.2 (A) 02/17/2020   HGBA1C 7.3 (A) 10/22/2019   Lab Results  Component Value Date   MICROALBUR <0.7 06/17/2019   Steele 92 02/17/2020   CREATININE 0.92 02/17/2020   Lab Results  Component Value Date   MICRALBCREAT 1.2 06/17/2019    Other active problems: See review of systems   Allergies as of 06/09/2020      Reactions   Ace Inhibitors Cough   Wheat Bran Hives   Gramineae Pollens       Medication List       Accurate as of June 09, 2020  3:55 PM. If you have any questions, ask your nurse or doctor.        acetaminophen 500 MG tablet Commonly known as: TYLENOL Take 1,000 mg by mouth 2 (two) times daily. As needed   amLODipine 10 MG tablet Commonly known as: NORVASC Take 1 tablet (10 mg total) by mouth daily.   canagliflozin 300 MG Tabs tablet Commonly known as: Invokana Take 1 tablet (300 mg total) by mouth daily before breakfast.   FreeStyle Libre 14 Day Reader Kerrin Mo See admin instructions.   FreeStyle Libre 2 Sensor Misc APPLY ONE SENSOR TO BODY ONCE [id number] DAYS TO MONITOR BLOOD SUGAR.   FreeStyle Libre 2 Sensor Misc APPLY 1 SENSOR TO BODY ONCE EVERY 14 DAYS   GINKGO BILOBA COMPLEX PO Take by mouth.   insulin aspart 100 UNIT/ML injection Commonly known as: novoLOG Use max of 78 units daily via insulin pump. Dx code E11.8   losartan 100 MG tablet Commonly known as: COZAAR Take 1 tablet by mouth once daily   meloxicam 15 MG tablet Commonly known as: MOBIC TAKE 1 TABLET BY MOUTH ONCE DAILY AS NEEDED FOR PAIN (SEVERE PAIN)   NON FORMULARY CPAP @@ 9 cm H2O nightly   omeprazole 20 MG capsule Commonly known as: PRILOSEC Take 1 capsule (20 mg total) by mouth daily.   OneTouch Verio test strip Generic drug: glucose blood Use to test blood sugar 4 times daily   OneTouch Verio w/Device Kit 1 each by Does  not apply route 4 (four) times daily.   polyethylene glycol-electrolytes 420 g  solution Commonly known as: NuLYTELY Drink one 8 oz every 20 mins until entire container is finished starting at 5:00pm on 10/09/19   pravastatin 20 MG tablet Commonly known as: PRAVACHOL Take 20 mg by mouth. three times weekly.   V-Go 20 Kit 1  ONCE DAILY   Vitamin D 50 MCG (2000 UT) Caps Take 1 capsule (2,000 Units total) by mouth daily.       Allergies:  Allergies  Allergen Reactions  . Ace Inhibitors Cough  . Wheat Bran Hives  . Gramineae Pollens     Past Medical History:  Diagnosis Date  . Allergy   . Arthritis   . Cataract   . Diabetes mellitus without complication ([facility])   . Hyperlipidemia   . Hypertension   . Motion sickness    planes  . OSA (obstructive sleep apnea) 08/23/2016   CPAP  . Osteoporosis   . Presence of dental prosthetic device    implants  . Radiculopathy of lumbosacral region 06/22/2016  . Right lateral epicondylitis 06/04/2017  . Right sided sciatica 03/09/2016  . Sleep apnea     Past Surgical History:  Procedure Laterality Date  . ABDOMINAL HYSTERECTOMY    . CATARACT EXTRACTION Right 2020  . COLONOSCOPY WITH PROPOFOL N/A 10/10/2019   Procedure: COLONOSCOPY WITH PROPOFOL;  Surgeon: Lucilla Lame, MD;  Location: [facility];  Service: Endoscopy;  Laterality: N/A;  . ESOPHAGOGASTRODUODENOSCOPY (EGD) WITH PROPOFOL N/A 10/10/2019   Procedure: ESOPHAGOGASTRODUODENOSCOPY (EGD) WITH PROPOFOL;  Surgeon: Lucilla Lame, MD;  Location: [facility];  Service: Endoscopy;  Laterality: N/A;  Diabetic - insulin pump and oral meds sleep apnea  . POLYPECTOMY  10/10/2019   Procedure: POLYPECTOMY;  Surgeon: Lucilla Lame, MD;  Location: [facility];  Service: Endoscopy;;    Family History  Problem Relation Age of Onset  . Breast cancer Other 40  . Alcohol abuse Father   . Mental illness Father   . Stroke Father   . Diabetes Sister   . Asthma Brother   . Diabetes Brother   . Diabetes Maternal Aunt   . Heart disease Maternal Aunt   .  Diabetes Maternal Uncle   . Early death Maternal Uncle   . Breast cancer Cousin 8       mat cousin    Social History:  reports that she has never smoked. She has never used smokeless tobacco. She reports that she does not drink alcohol and does not use drugs.   Review of Systems  Lipid history: She is is taking pravastatin as prescribed using 20 mg, tolerating this 3 days a week as recommended  Last LDL:   Lab Results  Component Value Date   CHOL 180 02/17/2020   HDL 74.70 02/17/2020   LDLCALC 92 02/17/2020   TRIG 65.0 02/17/2020   CHOLHDL 2 02/17/2020           Hypertension: on treatment for several years, on amlodipine and losartan   Has been followed by PCP  BP Readings from Last 3 Encounters:  06/09/20 (!) 142/77  03/09/20 138/80  02/17/20 130/80   She takes meloxicam 15 mg for joint pains 2 or 3 times a week  Most recent foot exam: 12/2019  OSTEOPOROSIS: Followed by PCP, lowest T score was- 2.6 at the spine compared to -3.0 before She was previously taking alendronate Also has had longstanding vitamin D deficiency  Now taking 4000 units of  vitamin D3   Lab Results  Component Value Date   VD25OH 40.70 02/17/2020   VD25OH 28.44 (L) 10/22/2019   VD25OH 29.1 (L) 04/20/2017   VD25OH 24.92 (L) 05/18/2016   VD25OH 31.3 07/28/2015   VD25OH 26.9 (L) 03/17/2015   VD25OH 21.5 (L) 09/17/2014      Physical Examination:  BP (!) 142/77   Pulse 77   Ht 5' 2" (1.575 m)   Wt 150 lb 3.2 oz (68.1 kg)   SpO2 95%   BMI 27.47 kg/m        ASSESSMENT:  Diabetes type 2 on insulin  See history of present illness for detailed discussion of current diabetes management, blood sugar patterns and problems identified  She is currently on insulin only with the V-go pump  A1c is higher at 8.1  As discussed above her understanding and management of her diabetes is totally erratic Also because of stress issues she is not able to focus on her diabetes management  especially with checking her sugars, changing her pump every 24 hours and bolusing at mealtime She has postprandial blood sugar spikes up to over 350 at times At times her overnight blood sugars do come down to good levels indicating adequate basal settings on her pump  Again discussed in detail reactions of the V-go pump, the differences between basal insulin and bolus insulin Showed her the need to control postprandial spikes on the graphic education piece She was explained the need to change her pump every 24 hours consistently Also discussed the need for going back on Invokana Explained action of SGLT 2 drugs on lowering glucose by decreasing kidney absorption of glucose, benefits of weight loss and lower blood pressure, possible side effects including candidiasis She may be able to afford it now  HYPERTENSION: Fair control but blood pressure may be higher today from stress and anxiety   PLAN:    She will try to change her pump every 24 hours every night  Be consistent and take 2-3 clicks for most of her meals and 1 click for snacks  She will try to make a note on her refrigerator to remind her to take a bolus when she is starting to eat  Explained to her the need for consistent monitoring with her freestyle libre and she will try to do so but likely may need to switch her to the [facility], she does not want to do this now  Call if having any hypoglycemia  Continue to leave off Metformin  New prescription for Invokana given  May take extra 1 click if blood sugar is over 180 before the meal but not during the night  Labs to be checked on the next visit    There are no Patient Instructions on file for this visit.      Elayne Snare 06/09/2020, 3:55 PM   Note: This office note was prepared with Dragon voice recognition system technology. Any transcriptional errors that result from this process are unintentional.

## 2020-06-28 ENCOUNTER — Other Ambulatory Visit: Payer: Self-pay

## 2020-06-28 ENCOUNTER — Telehealth: Payer: Self-pay | Admitting: Internal Medicine

## 2020-06-28 DIAGNOSIS — N760 Acute vaginitis: Secondary | ICD-10-CM

## 2020-06-28 MED ORDER — FLUCONAZOLE 100 MG PO TABS: 100.0000 mg | ORAL_TABLET | Freq: Every day | ORAL | 0 refills | Status: DC

## 2020-06-28 NOTE — Telephone Encounter (Signed)
Spoke to pt she thinks Invokana may be cause vaginal itching but she is not 100% sure. Told pt we would send in Diflucan. Pt will call us if she still has symptoms. Pt verbalized understanding.  KP

## 2020-06-28 NOTE — Telephone Encounter (Signed)
Medication Refill - Medication: fluconazole (DIFLUCAN) 100 MG tablet Pt asked to speak with Dr. Gaspar Cola nurse about sending this for her due to taking another medication can cause vaginal itching/ please advise   Has the patient contacted their pharmacy? No. (Agent: If no, request that the patient contact the pharmacy for the refill.) (Agent: If yes, when and what did the pharmacy advise?)  Preferred Pharmacy (with phone number or street name): [place], [place] - [place]  [address] [place] [address]  Phone:  [phone] Fax:  [phone]   Agent: Please be advised that RX refills may take up to 3 business days. We ask that you follow-up with your pharmacy.

## 2020-07-01 ENCOUNTER — Other Ambulatory Visit: Payer: Self-pay | Admitting: Internal Medicine

## 2020-07-01 DIAGNOSIS — Z1231 Encounter for screening mammogram for malignant neoplasm of breast: Secondary | ICD-10-CM

## 2020-07-15 DIAGNOSIS — E113393 Type 2 diabetes mellitus with moderate nonproliferative diabetic retinopathy without macular edema, bilateral: Secondary | ICD-10-CM | POA: Diagnosis not present

## 2020-07-23 ENCOUNTER — Other Ambulatory Visit: Payer: Self-pay | Admitting: Internal Medicine

## 2020-07-23 DIAGNOSIS — N760 Acute vaginitis: Secondary | ICD-10-CM

## 2020-07-26 ENCOUNTER — Other Ambulatory Visit: Payer: Self-pay

## 2020-07-26 ENCOUNTER — Telehealth: Payer: Self-pay | Admitting: Internal Medicine

## 2020-07-26 DIAGNOSIS — N760 Acute vaginitis: Secondary | ICD-10-CM

## 2020-07-26 MED ORDER — FLUCONAZOLE 100 MG PO TABS: 100.0000 mg | ORAL_TABLET | Freq: Every day | ORAL | 0 refills | Status: AC

## 2020-07-26 NOTE — Telephone Encounter (Signed)
Patient would like the nurse to call her regarding her medication for fluconazole, which she said she uses when she has an itch.  She stated she is going out of town and needs this asap.  The medication was not on her regular list.  Please call to discuss further at [phone]

## 2020-07-26 NOTE — Telephone Encounter (Signed)
Sent 1 tablet to [facility] in [place]. If patient needs more than this, she will need to come in .

## 2020-08-05 ENCOUNTER — Other Ambulatory Visit: Payer: Self-pay

## 2020-08-05 ENCOUNTER — Ambulatory Visit
Admission: RE | Admit: 2020-08-05 | Discharge: 2020-08-05 | Disposition: A | Discharge status: Home / Self Care | Payer: HMO | Source: Ambulatory Visit | Attending: Internal Medicine | Admitting: Internal Medicine

## 2020-08-05 DIAGNOSIS — Z1231 Encounter for screening mammogram for malignant neoplasm of breast: Secondary | ICD-10-CM | POA: Insufficient documentation

## 2020-08-09 ENCOUNTER — Ambulatory Visit: Payer: PPO

## 2020-08-10 ENCOUNTER — Telehealth: Payer: Self-pay | Admitting: Internal Medicine

## 2020-08-10 NOTE — Telephone Encounter (Signed)
Will route this note to [facility] Nurse just in case the patient returns call to clinic. [facility] Nurse may give patient results if pt returns the call.  CRM has also been created for this msg for call center purposes. Please attach any notes regarding this lab to this result message and do not create a new CRM.   KP

## 2020-08-10 NOTE — Telephone Encounter (Signed)
Patient's CPAP stopped working last night.  She would like to know how to go about getting replaced or fixed.

## 2020-08-10 NOTE — Telephone Encounter (Signed)
Tried calling patients home phone. Mailbox is full and could not leave message. Reviewed patients chart and patient recieves her CPAP supplies from [facility]. She needs to call them at [phone]. [facility] can give patient this information if she calls back.

## 2020-08-11 ENCOUNTER — Ambulatory Visit (INDEPENDENT_AMBULATORY_CARE_PROVIDER_SITE_OTHER): Payer: PPO

## 2020-08-11 DIAGNOSIS — Z0000 Encounter for general adult medical examination without abnormal findings: Secondary | ICD-10-CM | POA: Diagnosis not present

## 2020-08-11 NOTE — Patient Instructions (Signed)
Melinda Ford , Thank you for taking time to come for your Medicare Wellness Visit. I appreciate your ongoing commitment to your health goals. Please review the following plan we discussed and let me know if I can assist you in the future.   Screening recommendations/referrals: Colonoscopy: done 10/10/19 Mammogram: done 08/05/20 Bone Density: done 10/07/19 Recommended yearly ophthalmology/optometry visit for glaucoma screening and checkup Recommended yearly dental visit for hygiene and checkup  Vaccinations: Influenza vaccine: done 12/09/19 Pneumococcal vaccine: done 04/16/14 Tdap vaccine: done 01/22/17 Shingles vaccine: done 01/18/18 & 11/12/18   Covid-19: done 05/09/19, 06/06/19 & 02/10/20  Advanced directives: Please bring a copy of your health care power of attorney and living will to the office at your convenience.  Conditions/risks identified: Keep up the great work!  Next appointment: Follow up in one year for your annual wellness visit    Preventive Care 65 Years and Older, Female Preventive care refers to lifestyle choices and visits with your health care provider that can promote health and wellness. What does preventive care include?  A yearly physical exam. This is also called an annual well check.  Dental exams once or twice a year.  Routine eye exams. Ask your health care provider how often you should have your eyes checked.  Personal lifestyle choices, including:  Daily care of your teeth and gums.  Regular physical activity.  Eating a healthy diet.  Avoiding tobacco and drug use.  Limiting alcohol use.  Practicing safe sex.  Taking low-dose aspirin every day.  Taking vitamin and mineral supplements as recommended by your health care provider. What happens during an annual well check? The services and screenings done by your health care provider during your annual well check will depend on your age, overall health, lifestyle risk factors, and family history of  disease. Counseling  Your health care provider may ask you questions about your:  Alcohol use.  Tobacco use.  Drug use.  Emotional well-being.  Home and relationship well-being.  Sexual activity.  Eating habits.  History of falls.  Memory and ability to understand (cognition).  Work and work Statistician.  Reproductive health. Screening  You may have the following tests or measurements:  Height, weight, and BMI.  Blood pressure.  Lipid and cholesterol levels. These may be checked every 5 years, or more frequently if you are over 22 years old.  Skin check.  Lung cancer screening. You may have this screening every year starting at age 76 if you have a 30-pack-year history of smoking and currently smoke or have quit within the past 15 years.  Fecal occult blood test (FOBT) of the stool. You may have this test every year starting at age 76.  Flexible sigmoidoscopy or colonoscopy. You may have a sigmoidoscopy every 5 years or a colonoscopy every 10 years starting at age 76.  Hepatitis C blood test.  Hepatitis B blood test.  Sexually transmitted disease (STD) testing.  Diabetes screening. This is done by checking your blood sugar (glucose) after you have not eaten for a while (fasting). You may have this done every 1-3 years.  Bone density scan. This is done to screen for osteoporosis. You may have this done starting at age 76.  Mammogram. This may be done every 1-2 years. Talk to your health care provider about how often you should have regular mammograms. Talk with your health care provider about your test results, treatment options, and if necessary, the need for more tests. Vaccines  Your health care provider may  recommend certain vaccines, such as:  Influenza vaccine. This is recommended every year.  Tetanus, diphtheria, and acellular pertussis (Tdap, Td) vaccine. You may need a Td booster every 10 years.  Zoster vaccine. You may need this after age  76.  Pneumococcal 13-valent conjugate (PCV13) vaccine. One dose is recommended after age 52.  Pneumococcal polysaccharide (PPSV23) vaccine. One dose is recommended after age 76. Talk to your health care provider about which screenings and vaccines you need and how often you need them. This information is not intended to replace advice given to you by your health care provider. Make sure you discuss any questions you have with your health care provider. Document Released: 04/16/2015 Document Revised: 12/08/2015 Document Reviewed: 01/19/2015 Elsevier Interactive Patient Education  2017 [place] Prevention in the Home Falls can cause injuries. They can happen to people of all ages. There are many things you can do to make your home safe and to help prevent falls. What can I do on the outside of my home?  Regularly fix the edges of walkways and driveways and fix any cracks.  Remove anything that might make you trip as you walk through a door, such as a raised step or threshold.  Trim any bushes or trees on the path to your home.  Use bright outdoor lighting.  Clear any walking paths of anything that might make someone trip, such as rocks or tools.  Regularly check to see if handrails are loose or broken. Make sure that both sides of any steps have handrails.  Any raised decks and porches should have guardrails on the edges.  Have any leaves, snow, or ice cleared regularly.  Use sand or salt on walking paths during winter.  Clean up any spills in your garage right away. This includes oil or grease spills. What can I do in the bathroom?  Use night lights.  Install grab bars by the toilet and in the tub and shower. Do not use towel bars as grab bars.  Use non-skid mats or decals in the tub or shower.  If you need to sit down in the shower, use a plastic, non-slip stool.  Keep the floor dry. Clean up any water that spills on the floor as soon as it happens.  Remove  soap buildup in the tub or shower regularly.  Attach bath mats securely with double-sided non-slip rug tape.  Do not have throw rugs and other things on the floor that can make you trip. What can I do in the bedroom?  Use night lights.  Make sure that you have a light by your bed that is easy to reach.  Do not use any sheets or blankets that are too big for your bed. They should not hang down onto the floor.  Have a firm chair that has side arms. You can use this for support while you get dressed.  Do not have throw rugs and other things on the floor that can make you trip. What can I do in the kitchen?  Clean up any spills right away.  Avoid walking on wet floors.  Keep items that you use a lot in easy-to-reach places.  If you need to reach something above you, use a strong step stool that has a grab bar.  Keep electrical cords out of the way.  Do not use floor polish or wax that makes floors slippery. If you must use wax, use non-skid floor wax.  Do not have throw rugs and  other things on the floor that can make you trip. What can I do with my stairs?  Do not leave any items on the stairs.  Make sure that there are handrails on both sides of the stairs and use them. Fix handrails that are broken or loose. Make sure that handrails are as long as the stairways.  Check any carpeting to make sure that it is firmly attached to the stairs. Fix any carpet that is loose or worn.  Avoid having throw rugs at the top or bottom of the stairs. If you do have throw rugs, attach them to the floor with carpet tape.  Make sure that you have a light switch at the top of the stairs and the bottom of the stairs. If you do not have them, ask someone to add them for you. What else can I do to help prevent falls?  Wear shoes that:  Do not have high heels.  Have rubber bottoms.  Are comfortable and fit you well.  Are closed at the toe. Do not wear sandals.  If you use a  stepladder:  Make sure that it is fully opened. Do not climb a closed stepladder.  Make sure that both sides of the stepladder are locked into place.  Ask someone to hold it for you, if possible.  Clearly mark and make sure that you can see:  Any grab bars or handrails.  First and last steps.  Where the edge of each step is.  Use tools that help you move around (mobility aids) if they are needed. These include:  Canes.  Walkers.  Scooters.  Crutches.  Turn on the lights when you go into a dark area. Replace any light bulbs as soon as they burn out.  Set up your furniture so you have a clear path. Avoid moving your furniture around.  If any of your floors are uneven, fix them.  If there are any pets around you, be aware of where they are.  Review your medicines with your doctor. Some medicines can make you feel dizzy. This can increase your chance of falling. Ask your doctor what other things that you can do to help prevent falls. This information is not intended to replace advice given to you by your health care provider. Make sure you discuss any questions you have with your health care provider. Document Released: 01/14/2009 Document Revised: 08/26/2015 Document Reviewed: 04/24/2014 Elsevier Interactive Patient Education  2017 [employer].

## 2020-08-11 NOTE — Telephone Encounter (Signed)
Call to patient- notified of information provider by office.

## 2020-08-11 NOTE — Progress Notes (Signed)
Subjective:   Melinda Ford is a 75 y.o. female who presents for Medicare Annual (Subsequent) preventive examination.  Virtual Visit via Telephone Note  I connected with  Melinda Ford on 08/11/20 at 11:20 AM EDT by telephone and verified that I am speaking with the correct person using two identifiers.  Location: Patient: home Provider: [facility] Persons participating in the virtual visit: [place]   I discussed the limitations, risks, security and privacy concerns of performing an evaluation and management service by telephone and the availability of in person appointments. The patient expressed understanding and agreed to proceed.  Interactive audio and video telecommunications were attempted between this nurse and patient, however failed, due to patient having technical difficulties OR patient did not have access to video capability.  We continued and completed visit with audio only.  Some vital signs may be absent or patient reported.   Clemetine Marker, LPN    Review of Systems Cardiac Risk Factors include: advanced age (>8mn, >>68women);diabetes mellitus;dyslipidemia;hypertension     Objective:    Today's Vitals   08/11/20 1125  PainSc: 8    There is no height or weight on file to calculate BMI.  Advanced Directives 08/11/2020 10/10/2019 05/15/2018 11/20/2016 09/20/2016 02/29/2016 02/04/2016  Does Patient Have a Medical Advance Directive? _0  No Yes  Type of AParamed[place]of [place]rown Heights[place] will Healthcare Power of [place] will [place]Living will [place]Living will - -  Does patient want to make changes to medical advance directive? - No - Patient declined - - - - -  Copy of HPowersin Chart? No - copy requested No - copy requested No - copy requested No - copy requested - - -  Would patient like information on creating a  medical advance directive? - - - - - - -    Current Medications (verified) Outpatient Encounter Medications as of 08/11/2020  Medication Sig  . acetaminophen (TYLENOL) 500 MG tablet Take 1,000 mg by mouth 2 (two) times daily. As needed  . amLODipine (NORVASC) 10 MG tablet Take 1 tablet (10 mg total) by mouth daily.  . Blood Glucose Monitoring Suppl (ONETOUCH VERIO) w/Device KIT 1 each by Does not apply route 4 (four) times daily.  . canagliflozin (INVOKANA) 300 MG TABS tablet Take 1 tablet (300 mg total) by mouth daily before breakfast.  . Cholecalciferol (VITAMIN D) 2000 UNITS CAPS Take 1 capsule (2,000 Units total) by mouth daily.  . Continuous Blood Gluc Receiver (FREESTYLE LIBRE 14 DAY READER) DEVI See admin instructions.  . Continuous Blood Gluc Sensor (FREESTYLE LIBRE 2 SENSOR) MISC APPLY ONE SENSOR TO BODY ONCE [id number] TO MONITOR BLOOD SUGAR.  .Marland KitchenGINKGO BILOBA COMPLEX PO Take by mouth.  .Marland Kitchenglucose blood (ONETOUCH VERIO) test strip Use to test blood sugar 4 times daily  . insulin aspart (NOVOLOG) 100 UNIT/ML injection Use max of 78 units daily via insulin pump. Dx code E11.8  . Insulin Disposable Pump (V-GO 20) KIT 1  ONCE DAILY  . losartan (COZAAR) 100 MG tablet Take 1 tablet by mouth once daily  . meloxicam (MOBIC) 15 MG tablet TAKE 1 TABLET BY MOUTH ONCE DAILY AS NEEDED FOR PAIN (SEVERE PAIN)  . NON FORMULARY CPAP @@ 9 cm H2O nightly  . omeprazole (PRILOSEC) 20 MG capsule Take 1 capsule (20 mg total) by mouth daily.  . pravastatin (PRAVACHOL) 20 MG tablet Take 20  mg by mouth. three times weekly.  . [DISCONTINUED] Continuous Blood Gluc Sensor (FREESTYLE LIBRE 2 SENSOR) MISC APPLY 1 SENSOR TO BODY ONCE EVERY 14 DAYS  . [DISCONTINUED] polyethylene glycol-electrolytes (NULYTELY) 420 g solution Drink one 8 oz every 20 mins until entire container is finished starting at 5:00pm on 10/09/19   No facility-administered encounter medications on file as of 08/11/2020.    Allergies  (verified) Ace inhibitors, Wheat bran, and Gramineae pollens   History: Past Medical History:  Diagnosis Date  . Allergy   . Arthritis   . Cataract   . Diabetes mellitus without complication ([place])   . Hyperlipidemia   . Hypertension   . Motion sickness    planes  . OSA (obstructive sleep apnea) 08/23/2016   CPAP  . Osteoporosis   . Presence of dental prosthetic device    implants  . Radiculopathy of lumbosacral region 06/22/2016  . Right lateral epicondylitis 06/04/2017  . Right sided sciatica 03/09/2016  . Sleep apnea    Past Surgical History:  Procedure Laterality Date  . ABDOMINAL HYSTERECTOMY    . CATARACT EXTRACTION Right 2020  . COLONOSCOPY WITH PROPOFOL N/A 10/10/2019   Procedure: COLONOSCOPY WITH PROPOFOL;  Surgeon: Lucilla Lame, MD;  Location: [place];  Service: Endoscopy;  Laterality: N/A;  . ESOPHAGOGASTRODUODENOSCOPY (EGD) WITH PROPOFOL N/A 10/10/2019   Procedure: ESOPHAGOGASTRODUODENOSCOPY (EGD) WITH PROPOFOL;  Surgeon: Lucilla Lame, MD;  Location: [place];  Service: Endoscopy;  Laterality: N/A;  Diabetic - insulin pump and oral meds sleep apnea  . POLYPECTOMY  10/10/2019   Procedure: POLYPECTOMY;  Surgeon: Lucilla Lame, MD;  Location: [place];  Service: Endoscopy;;   Family History  Problem Relation Age of Onset  . Breast cancer Other 40  . Alcohol abuse Father   . Mental illness Father   . Stroke Father   . Diabetes Sister   . Asthma Brother   . Diabetes Brother   . Diabetes Maternal Aunt   . Heart disease Maternal Aunt   . Diabetes Maternal Uncle   . Early death Maternal Uncle   . Breast cancer Cousin 50       mat cousin   Social History   Socioeconomic History  . Marital status: Married    Spouse name: Not on file  . Number of children: 1  . Years of education: Not on file  . Highest education level: Doctorate  Occupational History  . Not on file  Tobacco Use  . Smoking status: Never Smoker  . Smokeless tobacco:  Never Used  Vaping Use  . Vaping Use: Never used  Substance and Sexual Activity  . Alcohol use: No    Alcohol/week: 1.0 standard drink    Types: 1 Standard drinks or equivalent per week  . Drug use: No  . Sexual activity: Not Currently  Other Topics Concern  . Not on file  Social History Narrative  . Not on file   Social Determinants of Health   Financial Resource Strain: Low Risk   . Difficulty of Paying Living Expenses: Not hard at all  Food Insecurity: No Food Insecurity  . Worried About Charity fundraiser in the Last Year: Never true  . Ran Out of Food in the Last Year: Never true  Transportation Needs: No Transportation Needs  . Lack of Transportation (Medical): No  . Lack of Transportation (Non-Medical): No  Physical Activity: Insufficiently Active  . Days of Exercise per Week: 2 days  . Minutes of Exercise per  Session: 60 min  Stress: No Stress Concern Present  . Feeling of Stress : Only a little  Social Connections: Socially Integrated  . Frequency of Communication with Friends and Family: More than three times a week  . Frequency of Social Gatherings with Friends and Family: More than three times a week  . Attends Religious Services: More than 4 times per year  . Active Member of Clubs or Organizations: Yes  . Attends Archivist Meetings: More than 4 times per year  . Marital Status: Married    Tobacco Counseling Counseling given: Not Answered   Clinical Intake:  Pre-visit preparation completed: Yes  Pain : 0-10 Pain Score: 8  Pain Type: Acute pain Pain Location: Hip Pain Orientation: Left Pain Descriptors / Indicators: Aching,Sore Pain Onset: In the past 7 days Pain Frequency: Intermittent Effect of Pain on Daily Activities: difficulty walking     Nutritional Risks: None Diabetes: Yes CBG done?: No Did pt. bring in CBG monitor from home?: No  How often do you need to have someone help you when you read instructions, pamphlets, or  other written materials from your doctor or pharmacy?: 1 - Never  Nutrition Risk Assessment:  Has the patient had any N/V/D within the last 2 months?  No  Does the patient have any non-healing wounds?  No  Has the patient had any unintentional weight loss or weight gain?  No   Diabetes:  Is the patient diabetic?  Yes  If diabetic, was a CBG obtained today?  No  Did the patient bring in their glucometer from home?  No  How often do you monitor your CBG's? 3 times daily.   Financial Strains and Diabetes Management:  Are you having any financial strains with the device, your supplies or your medication? No .  Does the patient want to be seen by Chronic Care Management for management of their diabetes?  No  Would the patient like to be referred to a Nutritionist or for Diabetic Management?  No   Diabetic Exams:  Diabetic Eye Exam: Completed 07/28/19. Overdue for diabetic eye exam. Pt has been advised about the importance in completing this exam.   Diabetic Foot Exam: Completed 12/09/19.    Interpreter Needed?: No  Information entered by :: Clemetine Marker LPN   Activities of Daily Living In your present state of health, do you have any difficulty performing the following activities: 08/11/2020 03/09/2020  Hearing? N N  Comment declines hearing aids -  Vision? N N  Difficulty concentrating or making decisions? Y N  Walking or climbing stairs? Y N  Dressing or bathing? N N  Doing errands, shopping? N N  Preparing Food and eating ? N -  Using the Toilet? N -  In the past six months, have you accidently leaked urine? Y -  Do you have problems with loss of bowel control? N -  Managing your Medications? N -  Managing your Finances? N -  Housekeeping or managing your Housekeeping? N -  Some recent data might be hidden    Patient Care Team: Glean Hess, MD as PCP - General (Internal Medicine) Elayne Snare, MD as Consulting Physician (Endocrinology) Samara Deist, DPM as  Referring Physician (Podiatry) Pa, [facility] (Ophthalmology) Margaretha Sheffield, MD (Otolaryngology) Yolonda Kida, MD as Consulting Physician (Cardiology)  Indicate any recent Medical Services you may have received from other than [facility] providers in the past year (date may be approximate).     Assessment:  This is a routine wellness examination for Melinda Ford.  Hearing/Vision screen  Hearing Screening   _0  _1  _2  _3  _4  _5  _6  _7  _8   Right ear:           Left ear:           Comments: Pt has no difficulty hearing  Vision Screening Comments: Annual vision screenings done by Dr. Gloriann Loan & [facility]  Dietary issues and exercise activities discussed: Current Exercise Habits: Structured exercise class, Type of exercise: calisthenics, Time (Minutes): 60, Frequency (Times/Week): 2, Weekly Exercise (Minutes/Week): 120, Intensity: Moderate, Exercise limited by: orthopedic condition(s)  Goals Addressed   None    Depression Screen PHQ 2/9 Scores 08/11/2020 03/09/2020 09/12/2019 08/18/2019 06/04/2019 11/27/2018 05/15/2018  PHQ - 2 Score 0 0 0 0 _9 PHQ- 9 Score - 0 0 0 7 - 5    Fall Risk Fall Risk  08/11/2020 03/09/2020 09/12/2019 08/18/2019 06/04/2019  Falls in the past year? 0 _10 Number falls in past yr: 0 1 0 1 1  Injury with Fall? 0 1 0 1 1  Risk for fall due to : No Fall Risks History of fall(s) - Impaired balance/gait;History of fall(s) History of fall(s);Impaired balance/gait  Follow up Falls prevention discussed Falls evaluation completed Falls evaluation completed Falls evaluation completed Falls evaluation completed    FALL RISK PREVENTION PERTAINING TO THE HOME:  Any stairs in or around the home? Yes  If so, are there any without handrails? No  Home free of loose throw rugs in walkways, pet beds, electrical cords, etc? Yes  Adequate lighting in your home to reduce risk of falls? Yes   ASSISTIVE DEVICES UTILIZED TO PREVENT FALLS:  Life  alert? No  Use of a cane, walker or w/c? No  Grab bars in the bathroom? Yes  Shower chair or bench in shower? No  Elevated toilet seat or a handicapped toilet? Yes   TIMED UP AND GO:  Was the test performed? No . Telephonic visit.   Cognitive Function: MMSE - Mini Mental State Exam 07/26/2015  Orientation to time 5  Orientation to Place 4  Registration 3  Attention/ Calculation 5  Recall 3  Language- name 2 objects 2  Language- repeat 1  Language- follow 3 step command 3  Language- read & follow direction 1  Write a sentence 1  Copy design 1  Total score 29     6CIT Screen 08/11/2020 09/12/2019 05/15/2018 11/20/2016  What Year? 0 points 0 points 0 points 0 points  What month? 0 points 0 points 0 points 0 points  What time? 0 points 0 points 0 points 0 points  Count back from 20 0 points 0 points 0 points 0 points  Months in reverse 0 points 0 points 0 points 0 points  Repeat phrase 0 points 2 points 0 points 0 points  Total Score 0 2 0 0    Immunizations Immunization History  Administered Date(s) Administered  . Fluad Quad(high Dose 65+) 12/09/2019  . Influenza, High Dose Seasonal PF 01/25/2016, 01/22/2017, 01/18/2018  . Influenza,inj,Quad PF,6+ Mos 02/24/2014, 01/15/2015  . Influenza-Unspecified 01/09/2011, 12/15/2011  . Moderna Sars-Covid-2 Vaccination 05/09/2019, 06/06/2019, 02/10/2020  . Pneumococcal Conjugate-13 04/16/2014  . Pneumococcal Polysaccharide-23 05/06/2010  . Tdap 01/22/2017  . Zoster 10/25/2010  . Zoster Recombinat (Shingrix) 01/18/2018, 11/12/2018    TDAP status: Up to date  Flu Vaccine status: Up to date  Pneumococcal vaccine status: Up to date  Covid-19 vaccine  status: Completed vaccines  Qualifies for Shingles Vaccine? Yes   Zostavax completed Yes   Shingrix Completed?: Yes  Screening Tests Health Maintenance  Topic Date Due  . OPHTHALMOLOGY EXAM  10/30/2019  . INFLUENZA VACCINE  11/01/2020  . FOOT EXAM  12/08/2020  . HEMOGLOBIN A1C   12/10/2020  . MAMMOGRAM  08/05/2021  . TETANUS/TDAP  01/23/2027  . COLONOSCOPY (Pts 45-71yr Insurance coverage will need to be confirmed)  10/09/2029  . DEXA SCAN  Completed  . COVID-19 Vaccine  Completed  . Hepatitis C Screening  Completed  . PNA vac Low Risk Adult  Completed  . HPV VACCINES  Aged Out    Health Maintenance  Health Maintenance Due  Topic Date Due  . OPHTHALMOLOGY EXAM  10/30/2019    Colorectal cancer screening: No longer required.   Mammogram status: Completed 08/05/20. Repeat every year  Bone Density status: Completed 10/07/19. Results reflect: Bone density results: OSTEOPOROSIS. Repeat every 2 years.  Lung Cancer Screening: (Low Dose CT Chest recommended if Age 76-80years, 30 pack-year currently smoking OR have quit w/in 15years.) does not qualify.   Additional Screening:  Hepatitis C Screening: does qualify; Completed 04/16/14  Vision Screening: Recommended annual ophthalmology exams for early detection of glaucoma and other disorders of the eye. Is the patient up to date with their annual eye exam?  No  Who is the provider or what is the name of the office in which the patient attends annual eye exams? Dr. BGloriann Loan   Dental Screening: Recommended annual dental exams for proper oral hygiene  Community Resource Referral / Chronic Care Management: CRR required this visit?  No   CCM required this visit?  No      Plan:     I have personally reviewed and noted the following in the patient's chart:   . Medical and social history . Use of alcohol, tobacco or illicit drugs  . Current medications and supplements including opioid prescriptions.  . Functional ability and status . Nutritional status . Physical activity . Advanced directives . List of other physicians . Hospitalizations, surgeries, and ER visits in previous 12 months . Vitals . Screenings to include cognitive, depression, and falls . Referrals and appointments  In addition, I have  reviewed and discussed with patient certain preventive protocols, quality metrics, and best practice recommendations. A written personalized care plan for preventive services as well as general preventive health recommendations were provided to patient.     KClemetine Marker LPN   [id number]  Nurse Notes: pt c/o left hip pain and vaginal itching which she attributes to invokana. Pt scheduled for follow up with Dr. BArmy Melia5/12/22.

## 2020-08-12 ENCOUNTER — Ambulatory Visit (INDEPENDENT_AMBULATORY_CARE_PROVIDER_SITE_OTHER): Payer: HMO | Admitting: Internal Medicine

## 2020-08-12 ENCOUNTER — Other Ambulatory Visit: Payer: Self-pay

## 2020-08-12 ENCOUNTER — Encounter: Payer: Self-pay | Admitting: Internal Medicine

## 2020-08-12 VITALS — BP 124/82 | HR 78 | Ht 62.0 in | Wt 161.0 lb

## 2020-08-12 DIAGNOSIS — M7072 Other bursitis of hip, left hip: Secondary | ICD-10-CM | POA: Diagnosis not present

## 2020-08-12 MED ORDER — MELOXICAM 15 MG PO TABS: ORAL_TABLET | ORAL | 0 refills | Status: DC

## 2020-08-12 NOTE — Progress Notes (Signed)
Date:  08/12/2020   Name:  Melinda Ford   DOB:  [birth year]   MRN:  [id number]   Chief Complaint: Hip Pain (Left Hip pain. Started and lasted for 2 days. Was walking a limping. Feels better today. Set in the bathtub with hot water Epson salt. And she started taking meloxicam and this both helped. )  Hip Pain  There was no injury mechanism. The pain is present in the left hip and right hip. The pain has been improving since onset. Associated symptoms comments: Very painful to weight bearing 2 days ago - much better today. The symptoms are aggravated by palpation, weight bearing and movement. She has tried acetaminophen and NSAIDs for the symptoms. The treatment provided significant relief.  She has been seen in the past by [facility] and told that she has arthritis in multiple joints, including her knee, back and hips.    Lab Results  Component Value Date   CREATININE 0.92 02/17/2020   BUN 24 (H) 02/17/2020   NA 136 02/17/2020   K 4.1 02/17/2020   CL 106 02/17/2020   CO2 26 02/17/2020   Lab Results  Component Value Date   CHOL 180 02/17/2020   HDL 74.70 02/17/2020   LDLCALC 92 02/17/2020   TRIG 65.0 02/17/2020   CHOLHDL 2 02/17/2020   Lab Results  Component Value Date   TSH 1.540 05/17/2018   Lab Results  Component Value Date   HGBA1C 8.1 (A) 06/09/2020   Lab Results  Component Value Date   WBC 5.2 06/04/2019   HGB 14.7 06/04/2019   HCT 45.1 06/04/2019   MCV 92 06/04/2019   PLT 293 06/04/2019   Lab Results  Component Value Date   ALT 24 02/17/2020   AST 21 02/17/2020   ALKPHOS 66 02/17/2020   BILITOT 0.4 02/17/2020     Review of Systems  Constitutional: Negative for chills and fatigue.  Respiratory: Negative for chest tightness and shortness of breath.   Cardiovascular: Negative for chest pain and leg swelling.  Gastrointestinal: Negative for abdominal pain, constipation, diarrhea and nausea.  Musculoskeletal: Positive for arthralgias and gait problem.  Negative for joint swelling.  Neurological: Negative for dizziness and headaches.    Patient Active Problem List   Diagnosis Date Noted  . Personal history of colonic polyps   . Polyp of sigmoid colon   . Abdominal pain, epigastric   . Patient noncompliant with statin medication 09/12/2019  . Type II diabetes mellitus with complication ([place]) 75/01/2584  . Hyperlipidemia associated with type 2 diabetes mellitus ([place]) 05/17/2018  . 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 Chronic midline thoracic back pain 02/22/2018  . Tinea versicolor 09/20/2016  . OSA (obstructive sleep apnea) 08/23/2016  . Allergic rhinitis 07/13/2016  . Overweight (BMI 25.0-29.9) 10/01/2015  . Vitamin D deficiency 09/18/2014  . Onychomycosis 09/16/2014  . Degenerative disc disease, lumbar 09/16/2014  . Benign essential HTN 10/10/2010  . Osteoporosis 10/10/2010    Allergies  Allergen Reactions  . Ace Inhibitors Cough  . Wheat Bran Hives  . Gramineae Pollens     Past Surgical History:  Procedure Laterality Date  . ABDOMINAL HYSTERECTOMY    . CATARACT EXTRACTION Right 2020  . COLONOSCOPY WITH PROPOFOL N/A 10/10/2019   Procedure: COLONOSCOPY WITH PROPOFOL;  Surgeon: Lucilla Lame, MD;  Location: [place];  Service: Endoscopy;  Laterality: N/A;  . ESOPHAGOGASTRODUODENOSCOPY (EGD) WITH PROPOFOL N/A 10/10/2019   Procedure: ESOPHAGOGASTRODUODENOSCOPY (EGD) WITH PROPOFOL;  Surgeon: Lucilla Lame, MD;  Location: [place];  Service: Endoscopy;  Laterality: N/A;  Diabetic - insulin pump and oral meds sleep apnea  . POLYPECTOMY  10/10/2019   Procedure: POLYPECTOMY;  Surgeon: Lucilla Lame, MD;  Location: [place];  Service: Endoscopy;;    Social History   Tobacco Use  . Smoking status: Never Smoker  . Smokeless tobacco: Never Used  Vaping Use  . Vaping Use: Never used  Substance Use Topics  . Alcohol use: No    Alcohol/week: 1.0 standard drink     Types: 1 Standard drinks or equivalent per week  . Drug use: No     Medication list has been reviewed and updated.  Current Meds  Medication Sig  . acetaminophen (TYLENOL) 500 MG tablet Take 1,000 mg by mouth 2 (two) times daily. As needed  . amLODipine (NORVASC) 10 MG tablet Take 1 tablet (10 mg total) by mouth daily.  . Blood Glucose Monitoring Suppl (ONETOUCH VERIO) w/Device KIT 1 each by Does not apply route 4 (four) times daily.  . canagliflozin (INVOKANA) 300 MG TABS tablet Take 1 tablet (300 mg total) by mouth daily before breakfast.  . Cholecalciferol (VITAMIN D) 2000 UNITS CAPS Take 1 capsule (2,000 Units total) by mouth daily.  . Continuous Blood Gluc Receiver (FREESTYLE LIBRE 14 DAY READER) DEVI See admin instructions.  . Continuous Blood Gluc Sensor (FREESTYLE LIBRE 2 SENSOR) MISC APPLY ONE SENSOR TO BODY ONCE [id number] DAYS TO MONITOR BLOOD SUGAR.  Marland Kitchen GINKGO BILOBA COMPLEX PO Take by mouth.  Marland Kitchen glucose blood (ONETOUCH VERIO) test strip Use to test blood sugar 4 times daily  . insulin aspart (NOVOLOG) 100 UNIT/ML injection Use max of 78 units daily via insulin pump. Dx code E11.8  . Insulin Disposable Pump (V-GO 20) KIT 1  ONCE DAILY  . losartan (COZAAR) 100 MG tablet Take 1 tablet by mouth once daily  . meloxicam (MOBIC) 15 MG tablet TAKE 1 TABLET BY MOUTH ONCE DAILY AS NEEDED FOR PAIN (SEVERE PAIN)  . NON FORMULARY CPAP @@ 9 cm H2O nightly  . omeprazole (PRILOSEC) 20 MG capsule Take 1 capsule (20 mg total) by mouth daily.  . pravastatin (PRAVACHOL) 20 MG tablet Take 20 mg by mouth. three times weekly.    PHQ 2/9 Scores 08/12/2020 08/11/2020 03/09/2020 09/12/2019  PHQ - 2 Score 0 0 0 0  PHQ- 9 Score 2 - 0 0    GAD 7 : Generalized Anxiety Score 08/12/2020 03/09/2020 09/12/2019 08/18/2019  Nervous, Anxious, on Edge 1 0 0 0  Control/stop worrying 1 0 0 0  Worry too much - different things 1 0 0 0  Trouble relaxing 0 0 0 0  Restless 0 0 0 0  Easily annoyed or irritable 0 0 0 0   Afraid - awful might happen 0 0 0 0  Total GAD 7 Score 3 0 0 0  Anxiety Difficulty - Not difficult at all Not difficult at all Not difficult at all    BP Readings from Last 3 Encounters:  08/12/20 124/82  06/09/20 (!) 142/77  03/09/20 138/80    Physical Exam Vitals and nursing note reviewed.  Constitutional:      General: She is not in acute distress.    Appearance: She is well-developed.  HENT:     Head: Normocephalic and atraumatic.  Pulmonary:     Effort: Pulmonary effort is normal. No respiratory distress.  Musculoskeletal:     Lumbar back: No spasms, tenderness or bony  tenderness. Positive right straight leg raise test. Negative left straight leg raise test.     Right hip: No tenderness or bony tenderness. Decreased range of motion.     Left hip: Tenderness (along lateral hip/upper thigh) present. Decreased range of motion.  Skin:    General: Skin is warm and dry.     Findings: No rash.  Neurological:     Mental Status: She is alert and oriented to person, place, and time.  Psychiatric:        Mood and Affect: Mood normal.        Behavior: Behavior normal.     Wt Readings from Last 3 Encounters:  08/12/20 161 lb (73 kg)  06/09/20 150 lb 3.2 oz (68.1 kg)  03/09/20 159 lb (72.1 kg)    BP 124/82   Pulse 78   Ht $R'5\' 2"'EX$  (1.575 m)   Wt 161 lb (73 kg)   SpO2 95%   BMI 29.45 kg/m   Assessment and Plan: 1. Bursitis of left hip, unspecified bursa Much improved with warm soaks and Mobic/Tylenol Continue Mobic daily as needed If sx worsen again, will recommend follow up with Orthopedics - meloxicam (MOBIC) 15 MG tablet; One tablet daily as needed for arthritis pain  Dispense: 30 tablet; Refill: 0   Partially dictated using Editor, commissioning. Any errors are unintentional.  Halina Maidens, MD [facility]  08/12/2020

## 2020-08-12 NOTE — Patient Instructions (Addendum)
[facility] locations  You can take Mobic once a day as needed  You can also take tylenol, use ice or heat and use topical rubs

## 2020-08-20 ENCOUNTER — Ambulatory Visit: Payer: PPO | Admitting: Endocrinology

## 2020-08-26 ENCOUNTER — Encounter: Payer: Self-pay | Admitting: Internal Medicine

## 2020-08-27 ENCOUNTER — Telehealth: Payer: Self-pay

## 2020-08-27 ENCOUNTER — Encounter: Payer: Self-pay | Admitting: Internal Medicine

## 2020-08-27 ENCOUNTER — Telehealth (INDEPENDENT_AMBULATORY_CARE_PROVIDER_SITE_OTHER): Payer: HMO | Admitting: Internal Medicine

## 2020-08-27 DIAGNOSIS — U071 COVID-19: Secondary | ICD-10-CM

## 2020-08-27 DIAGNOSIS — R059 Cough, unspecified: Secondary | ICD-10-CM

## 2020-08-27 MED ORDER — PROMETHAZINE-DM 6.25-15 MG/5ML PO SYRP: 5.0000 mL | ORAL_SOLUTION | Freq: Four times a day (QID) | ORAL | 0 refills | Status: DC | PRN

## 2020-08-27 MED ORDER — MOLNUPIRAVIR 200 MG PO CAPS: 800.0000 mg | ORAL_CAPSULE | Freq: Two times a day (BID) | ORAL | 0 refills | Status: AC

## 2020-08-27 NOTE — Progress Notes (Signed)
Date:  08/27/2020   Name:  Melinda Ford   DOB:  [birth year]   MRN:  [id number]  This encounter was conducted via video encounter due to the need for social distancing in light of the Covid-19 pandemic.  The patient was correctly identified.  I advised that I am conducting the visit from a secure room in my office at [facility] clinic.  The patient is located home. The limitations of this form of encounter were discussed with the patient and he/she agreed to proceed.  Some vital signs will be absent.    Chief Complaint: Covid Positive (Symptoms started 2-3 days ago,Home test yesterday positive,Cough, runny nose, sore throat, no chills, no fever, head stuffiness, hoarse, pt wants cough medication)  Cough This is a new problem. Episode onset: three days ago. The problem has been gradually worsening. The problem occurs every few minutes. The cough is non-productive. Associated symptoms include myalgias, nasal congestion, postnasal drip and a sore throat. Pertinent negatives include no chest pain, chills, fever, headaches or shortness of breath. Treatments tried: theraflu. The treatment provided mild relief.  Her husband become ill 6 days ago and tested positive.  She tested negative but then became ill 2 days ago and tested positive at that time.    Immunization History  Administered Date(s) Administered  . Fluad Quad(high Dose 65+) 12/09/2019  . Influenza, High Dose Seasonal PF 01/25/2016, 01/22/2017, 01/18/2018  . Influenza,inj,Quad PF,6+ Mos 02/24/2014, 01/15/2015  . Influenza-Unspecified 01/09/2011, 12/15/2011  . Moderna Sars-Covid-2 Vaccination 05/09/2019, 06/06/2019, 02/10/2020  . Pneumococcal Conjugate-13 04/16/2014  . Pneumococcal Polysaccharide-23 05/06/2010  . Tdap 01/22/2017  . Zoster Recombinat (Shingrix) 01/18/2018, 11/12/2018  . Zoster, Live 10/25/2010    Lab Results  Component Value Date   CREATININE 0.92 02/17/2020   BUN 24 (H) 02/17/2020   NA 136 02/17/2020   K 4.1  02/17/2020   CL 106 02/17/2020   CO2 26 02/17/2020   Lab Results  Component Value Date   CHOL 180 02/17/2020   HDL 74.70 02/17/2020   LDLCALC 92 02/17/2020   TRIG 65.0 02/17/2020   CHOLHDL 2 02/17/2020   Lab Results  Component Value Date   TSH 1.540 05/17/2018   Lab Results  Component Value Date   HGBA1C 8.1 (A) 06/09/2020   Lab Results  Component Value Date   WBC 5.2 06/04/2019   HGB 14.7 06/04/2019   HCT 45.1 06/04/2019   MCV 92 06/04/2019   PLT 293 06/04/2019   Lab Results  Component Value Date   ALT 24 02/17/2020   AST 21 02/17/2020   ALKPHOS 66 02/17/2020   BILITOT 0.4 02/17/2020     Review of Systems  Constitutional: Negative for chills and fever.  HENT: Positive for postnasal drip and sore throat. Negative for trouble swallowing.   Respiratory: Positive for cough. Negative for shortness of breath.   Cardiovascular: Negative for chest pain, palpitations and leg swelling.  Musculoskeletal: Positive for myalgias. Negative for arthralgias and gait problem.  Neurological: Negative for dizziness, light-headedness and headaches.    Patient Active Problem List   Diagnosis Date Noted  . Personal history of colonic polyps   . Polyp of sigmoid colon   . Abdominal pain, epigastric   . Patient noncompliant with statin medication 09/12/2019  . Type II diabetes mellitus with complication ([place]) 02/54/2706  . Hyperlipidemia associated with type 2 diabetes mellitus ([place]) 05/17/2018  . 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 Chronic midline thoracic back pain  02/22/2018  . Tinea versicolor 09/20/2016  . OSA (obstructive sleep apnea) 08/23/2016  . Allergic rhinitis 07/13/2016  . Overweight (BMI 25.0-29.9) 10/01/2015  . Vitamin D deficiency 09/18/2014  . Onychomycosis 09/16/2014  . Degenerative disc disease, lumbar 09/16/2014  . Benign essential HTN 10/10/2010  . Osteoporosis 10/10/2010    Allergies  Allergen Reactions  . Ace  Inhibitors Cough  . Wheat Bran Hives  . Gramineae Pollens     Past Surgical History:  Procedure Laterality Date  . ABDOMINAL HYSTERECTOMY    . CATARACT EXTRACTION Right 2020  . COLONOSCOPY WITH PROPOFOL N/A 10/10/2019   Procedure: COLONOSCOPY WITH PROPOFOL;  Surgeon: Lucilla Lame, MD;  Location: [place];  Service: Endoscopy;  Laterality: N/A;  . ESOPHAGOGASTRODUODENOSCOPY (EGD) WITH PROPOFOL N/A 10/10/2019   Procedure: ESOPHAGOGASTRODUODENOSCOPY (EGD) WITH PROPOFOL;  Surgeon: Lucilla Lame, MD;  Location: [place];  Service: Endoscopy;  Laterality: N/A;  Diabetic - insulin pump and oral meds sleep apnea  . POLYPECTOMY  10/10/2019   Procedure: POLYPECTOMY;  Surgeon: Lucilla Lame, MD;  Location: [place];  Service: Endoscopy;;    Social History   Tobacco Use  . Smoking status: Never Smoker  . Smokeless tobacco: Never Used  Vaping Use  . Vaping Use: Never used  Substance Use Topics  . Alcohol use: No    Alcohol/week: 1.0 standard drink    Types: 1 Standard drinks or equivalent per week  . Drug use: No     Medication list has been reviewed and updated.  Current Meds  Medication Sig  . acetaminophen (TYLENOL) 500 MG tablet Take 1,000 mg by mouth 2 (two) times daily. As needed  . amLODipine (NORVASC) 10 MG tablet Take 1 tablet (10 mg total) by mouth daily.  . Blood Glucose Monitoring Suppl (ONETOUCH VERIO) w/Device KIT 1 each by Does not apply route 4 (four) times daily.  . canagliflozin (INVOKANA) 300 MG TABS tablet Take 1 tablet (300 mg total) by mouth daily before breakfast.  . Cholecalciferol (VITAMIN D) 2000 UNITS CAPS Take 1 capsule (2,000 Units total) by mouth daily.  . Continuous Blood Gluc Receiver (FREESTYLE LIBRE 14 DAY READER) DEVI See admin instructions.  . Continuous Blood Gluc Sensor (FREESTYLE LIBRE 2 SENSOR) MISC APPLY ONE SENSOR TO BODY ONCE [id number] DAYS TO MONITOR BLOOD SUGAR.  Marland Kitchen GINKGO BILOBA COMPLEX PO Take by mouth.  Marland Kitchen glucose  blood (ONETOUCH VERIO) test strip Use to test blood sugar 4 times daily  . insulin aspart (NOVOLOG) 100 UNIT/ML injection Use max of 78 units daily via insulin pump. Dx code E11.8  . Insulin Disposable Pump (V-GO 20) KIT 1  ONCE DAILY  . losartan (COZAAR) 100 MG tablet Take 1 tablet by mouth once daily  . meloxicam (MOBIC) 15 MG tablet One tablet daily as needed for arthritis pain  . Molnupiravir 200 MG CAPS Take 4 capsules (800 mg total) by mouth in the morning and at bedtime for 5 days.  . NON FORMULARY CPAP @@ 9 cm H2O nightly  . omeprazole (PRILOSEC) 20 MG capsule Take 1 capsule (20 mg total) by mouth daily.  . pravastatin (PRAVACHOL) 20 MG tablet Take 20 mg by mouth. three times weekly.  . promethazine-dextromethorphan (PROMETHAZINE-DM) 6.25-15 MG/5ML syrup Take 5 mLs by mouth 4 (four) times daily as needed for cough.    PHQ 2/9 Scores 08/27/2020 08/12/2020 08/11/2020 03/09/2020  PHQ - 2 Score 0 0 0 0  PHQ- 9 Score 1 2 - 0    GAD 7 :  Generalized Anxiety Score 08/27/2020 08/12/2020 03/09/2020 09/12/2019  Nervous, Anxious, on Edge 1 1 0 0  Control/stop worrying 1 1 0 0  Worry too much - different things 1 1 0 0  Trouble relaxing 0 0 0 0  Restless 0 0 0 0  Easily annoyed or irritable 0 0 0 0  Afraid - awful might happen 0 0 0 0  Total GAD 7 Score 3 3 0 0  Anxiety Difficulty - - Not difficult at all Not difficult at all    BP Readings from Last 3 Encounters:  08/12/20 124/82  06/09/20 (!) 142/77  03/09/20 138/80    Physical Exam HENT:     Nose: Rhinorrhea present.  Pulmonary:     Effort: Pulmonary effort is normal.     Comments: No cough or dyspnea noted during the call Neurological:     Mental Status: She is alert.  Psychiatric:        Attention and Perception: Attention normal.        Mood and Affect: Mood normal.        Speech: Speech normal.        Cognition and Memory: Cognition normal.     Wt Readings from Last 3 Encounters:  08/12/20 161 lb (73 kg)  06/09/20 150  lb 3.2 oz (68.1 kg)  03/09/20 159 lb (72.1 kg)    There were no vitals taken for this visit.  Assessment and Plan: 1. COVID-19 Patient is at moderate risk for complications due to age and HTN. Quarantine for at least 5 days - Rest and fluids; precautions for seeking in person care include especially shortness of breath, persistent fever, vomiting and diarrhea or any new/worsening sx. - Molnupiravir 200 MG CAPS; Take 4 capsules (800 mg total) by mouth in the morning and at bedtime for 5 days.  Dispense: 40 capsule; Refill: 0  2. Cough Continue over the counter cold and sinus medication if helpful. - promethazine-dextromethorphan (PROMETHAZINE-DM) 6.25-15 MG/5ML syrup; Take 5 mLs by mouth 4 (four) times daily as needed for cough.  Dispense: 180 mL; Refill: 0  I spent 15 minutes on this encounter. Partially dictated using Editor, commissioning. Any errors are unintentional.  Halina Maidens, MD [facility]  08/27/2020

## 2020-08-27 NOTE — Telephone Encounter (Signed)
This visit type is being conducted due to national recommendations for restrictions regarding the COVID- 19 Pandemic (e.g. social distancing) in effort to limit this patients exposure and mitigate transmission in our community. This visit type is felt to be most appropriate for this patient at this time. I connected with the patient today and received telephone consent from the patient and patient understand this consent will be good for 1 year.  KP  

## 2020-09-01 ENCOUNTER — Ambulatory Visit: Payer: PPO | Admitting: Endocrinology

## 2020-09-02 DIAGNOSIS — H6123 Impacted cerumen, bilateral: Secondary | ICD-10-CM | POA: Diagnosis not present

## 2020-09-02 DIAGNOSIS — H903 Sensorineural hearing loss, bilateral: Secondary | ICD-10-CM | POA: Diagnosis not present

## 2020-09-03 ENCOUNTER — Telehealth: Payer: Self-pay | Admitting: Internal Medicine

## 2020-09-03 NOTE — Telephone Encounter (Signed)
Spoke with patient to verify where she tested positive for Covid at and she stated she took a home test to confirm.  Per patient has had 2 positive home Covid tests and symptoms began 2 weeks ago.  Notified patient there is no treatment for the Covid virus at this time, but symptoms can be treated.  Patient taking Promethazine cough syrup 4 times daily for persistent cough symptoms.  Advised patient to stay hydrated with plenty of fluids, cough and deep breathe, and move around often to prevent mucus from settling in her lungs.  Patient verbalized understanding.  For your information.  Please advise if there is anything to add.

## 2020-09-03 NOTE — Telephone Encounter (Signed)
Pt is let  Dr. Army Melia know that she did test positve for COVID does she need more cough syrup?Pt reports that she is still cough with phelm in throat and chest. And is she contagious? CB- [address]

## 2020-09-05 NOTE — Telephone Encounter (Signed)
Noted and agree with advice.

## 2020-09-08 ENCOUNTER — Telehealth: Payer: Self-pay

## 2020-09-08 ENCOUNTER — Other Ambulatory Visit: Payer: Self-pay

## 2020-09-08 ENCOUNTER — Telehealth: Payer: Self-pay | Admitting: Internal Medicine

## 2020-09-08 DIAGNOSIS — R059 Cough, unspecified: Secondary | ICD-10-CM

## 2020-09-08 MED ORDER — PROMETHAZINE-DM 6.25-15 MG/5ML PO SYRP: 5.0000 mL | ORAL_SOLUTION | Freq: Four times a day (QID) | ORAL | 0 refills | Status: DC | PRN

## 2020-09-08 NOTE — Telephone Encounter (Signed)
Requested medication (s) are due for refill today: yes   Requested medication (s) are on the active medication list: yes   Last refill: 08/27/2020  Future visit scheduled: yes   Notes to clinic:  this refill cannot be delegated    Requested Prescriptions  Pending Prescriptions Disp Refills   promethazine-dextromethorphan (PROMETHAZINE-DM) 6.25-15 MG/5ML syrup 180 mL 0    Sig: Take 5 mLs by mouth 4 (four) times daily as needed for cough.      There is no refill protocol information for this order

## 2020-09-08 NOTE — Telephone Encounter (Signed)
Copied from [place] [id number]. Topic: General - Other >> Sep 08, 2020  8:23 AM Leward Quan A wrote: Reason for CRM: Patient called in to inquire of Dr Army Melia if she can get something for the cold symptoms and cough  that she still have lingering from her having Covid back in May. Per patient she is going to her family reunion and need something to help her feel better before she goes. Please call patient at  Ph# [phone]

## 2020-09-08 NOTE — Telephone Encounter (Signed)
Copied from [place] [id number]. Topic: Quick Communication - Rx Refill/Question >> Sep 08, 2020  8:35 AM Leward Quan A wrote: Medication: promethazine-dextromethorphan (PROMETHAZINE-DM) 6.25-15 MG/5ML syrup  Has the patient contacted their pharmacy? Yes.   (Agent: If no, request that the patient contact the pharmacy for the refill.) (Agent: If yes, when and what did the pharmacy advise?)  Preferred Pharmacy (with phone number or street name): [place], [place] - [place]  Phone:  [phone] Fax:  [phone]     Agent: Please be advised that RX refills may take up to 3 business days. We ask that you follow-up with your pharmacy.

## 2020-09-09 ENCOUNTER — Ambulatory Visit: Payer: HMO | Admitting: Internal Medicine

## 2020-09-09 ENCOUNTER — Other Ambulatory Visit: Payer: Self-pay | Admitting: Internal Medicine

## 2020-09-09 ENCOUNTER — Ambulatory Visit: Payer: Self-pay

## 2020-09-09 MED ORDER — ONDANSETRON 8 MG PO TBDP: 8.0000 mg | ORAL_TABLET | Freq: Three times a day (TID) | ORAL | 0 refills | Status: DC | PRN

## 2020-09-09 NOTE — Telephone Encounter (Signed)
Pt. Wants PCP to know she still testing positive. Will try the "cough medicine today." "I'm also nauseated and would like something sent to the pharmacy for that." Please advise.

## 2020-09-09 NOTE — Telephone Encounter (Signed)
Called pt left message to call back.  [facility] nurse may give results to patient if they return call to the clinic. CRM has also been created for this message for call center purposes  KP

## 2020-09-09 NOTE — Telephone Encounter (Signed)
Please review.  KP

## 2020-09-09 NOTE — Telephone Encounter (Signed)
Pt returned call. Message read, verbalizes understanding. 

## 2020-09-13 ENCOUNTER — Ambulatory Visit: Payer: Self-pay | Admitting: Internal Medicine

## 2020-09-17 ENCOUNTER — Other Ambulatory Visit: Payer: Self-pay | Admitting: Internal Medicine

## 2020-09-17 NOTE — Telephone Encounter (Signed)
Requested medication (s) are due for refill today: yes  Requested medication (s) are on the active medication list: yes  Last refill: 09/09/2020  Future visit scheduled: yes   Notes to clinic: this refill cannot be delegated    Requested Prescriptions  Pending Prescriptions Disp Refills   ondansetron (ZOFRAN-ODT) 8 MG disintegrating tablet [Pharmacy Med Name: Ondansetron 8 MG Oral Tablet Disintegrating] 10 tablet 0    Sig: DISSOLVE 1 TABLET IN MOUTH EVERY 8 HOURS AS NEEDED FOR NAUSEA OR VOMITING.      Not Delegated - Gastroenterology: Antiemetics Failed - 09/17/2020 12:39 PM      Failed - This refill cannot be delegated      Passed - Valid encounter within last 6 months    Recent Outpatient Visits           3 weeks ago [facility] Glean Hess, MD   1 month ago Bursitis of left hip, unspecified bursa   [facility] Glean Hess, MD   6 months ago Acute pain of right knee   [facility] Glean Hess, MD   9 months ago Annual physical exam   [facility] Glean Hess, MD   1 year ago Age-related osteoporosis without current pathological fracture   [facility] Glean Hess, MD       Future Appointments             In 3 months Army Melia Jesse Sans, MD [facility]

## 2020-09-23 ENCOUNTER — Other Ambulatory Visit: Payer: Self-pay | Admitting: Internal Medicine

## 2020-09-23 DIAGNOSIS — M79675 Pain in left toe(s): Secondary | ICD-10-CM | POA: Diagnosis not present

## 2020-09-23 DIAGNOSIS — E1142 Type 2 diabetes mellitus with diabetic polyneuropathy: Secondary | ICD-10-CM | POA: Diagnosis not present

## 2020-09-23 DIAGNOSIS — B351 Tinea unguium: Secondary | ICD-10-CM | POA: Diagnosis not present

## 2020-09-23 DIAGNOSIS — M79674 Pain in right toe(s): Secondary | ICD-10-CM | POA: Diagnosis not present

## 2020-09-23 NOTE — Telephone Encounter (Signed)
Signed 09/09/20. Future visit in 3 months

## 2020-10-01 ENCOUNTER — Encounter: Payer: Self-pay | Admitting: Endocrinology

## 2020-10-01 ENCOUNTER — Other Ambulatory Visit: Payer: Self-pay

## 2020-10-01 ENCOUNTER — Ambulatory Visit (INDEPENDENT_AMBULATORY_CARE_PROVIDER_SITE_OTHER): Payer: HMO | Admitting: Endocrinology

## 2020-10-01 VITALS — BP 140/70 | HR 63 | Ht 62.0 in | Wt 164.4 lb

## 2020-10-01 DIAGNOSIS — E1165 Type 2 diabetes mellitus with hyperglycemia: Secondary | ICD-10-CM

## 2020-10-01 DIAGNOSIS — E7800 Pure hypercholesterolemia, unspecified: Secondary | ICD-10-CM

## 2020-10-01 DIAGNOSIS — E118 Type 2 diabetes mellitus with unspecified complications: Secondary | ICD-10-CM | POA: Diagnosis not present

## 2020-10-01 DIAGNOSIS — Z794 Long term (current) use of insulin: Secondary | ICD-10-CM | POA: Diagnosis not present

## 2020-10-01 LAB — POCT GLYCOSYLATED HEMOGLOBIN (HGB A1C): Hemoglobin A1C: 7.6 % — AB (ref 4.0–5.6)

## 2020-10-01 LAB — MICROALBUMIN / CREATININE URINE RATIO
Creatinine,U: 123.4 mg/dL
Microalb Creat Ratio: 0.7 mg/g (ref 0.0–30.0)
Microalb, Ur: 0.8 mg/dL (ref 0.0–1.9)

## 2020-10-01 LAB — COMPREHENSIVE METABOLIC PANEL
ALT: 34 U/L (ref 0–35)
AST: 24 U/L (ref 0–37)
Albumin: 4 g/dL (ref 3.5–5.2)
Alkaline Phosphatase: 76 U/L (ref 39–117)
BUN: 25 mg/dL — ABNORMAL HIGH (ref 6–23)
CO2: 27 mEq/L (ref 19–32)
Calcium: 10.1 mg/dL (ref 8.4–10.5)
Chloride: 105 mEq/L (ref 96–112)
Creatinine, Ser: 0.85 mg/dL (ref 0.40–1.20)
GFR: 66.73 mL/min (ref >60.00)
Glucose, Bld: 152 mg/dL — ABNORMAL HIGH (ref 70–99)
Potassium: 4.4 mEq/L (ref 3.5–5.1)
Sodium: 137 mEq/L (ref 135–145)
Total Bilirubin: 0.4 mg/dL (ref 0.2–1.2)
Total Protein: 7.3 g/dL (ref 6.0–8.3)

## 2020-10-01 LAB — LIPID PANEL
Cholesterol: 211 mg/dL — ABNORMAL HIGH (ref 0–200)
HDL: 87 mg/dL (ref >39.00)
LDL Cholesterol: 111 mg/dL — ABNORMAL HIGH (ref 0–99)
NonHDL: 123.87
Total CHOL/HDL Ratio: 2
Triglycerides: 63 mg/dL (ref 0.0–149.0)
VLDL: 12.6 mg/dL (ref 0.0–40.0)

## 2020-10-01 NOTE — Patient Instructions (Addendum)
2-3 clicks for any meal that has Carbs, bolus within 10 min of meals No clicks late at night for high sugars   change pump every 24 hours daily  Check blood sugars on waking up   Also check blood sugars about 2 hours after meals and do this after different meals by rotation  Recommended blood sugar levels on waking up are 90-130 and about 2 hours after meal is 130-180  Please bring your blood sugar monitor to each visit, thank you

## 2020-10-01 NOTE — Progress Notes (Signed)
Patient ID: Melinda Ford, female   DOB: [birth year], 75 y.o.   MRN: [id number]            Reason for Appointment: Follow-up for Type 2 Diabetes   History of Present Illness:          Date of diagnosis of type 2 diabetes mellitus: ?  2004        Background history:   She previously had been on metformin and also Avandia initially Detailed records of her care are not available prior to 2013 or so She was tried on Januvia in 2016 but this was not effective and this was stopped in 5/70 She was started on insulin in 2/16 with small doses of Lantus insulin Her A1c has been consistently over 8% since about 2013  Recent history:   INSULIN regimen is:   V-go 20 unit pump since 0/63/01, boluses 0-2 clicks with meals     Non-insulin hypoglycemic drugs the patient is taking are: None  Her A1c is 7.9 compared to 8.1  Current management, blood sugar patterns and problems identified: She has not been checking her blood sugars regularly with her freestyle libre and only active 55% of the time with some days checking only once or twice a day and some days none.  She was asked to try and change her pump consistently at the same time and she thinks she is doing better with this. However she still has some periods of extreme hyperglycemia both midday and late evening She was also advised to try and bolus consistently before eating She said that she is forgetting to bolus before eating Also may bolus sometimes after eating or not at all but she does not think she is bolusing late at night when her sugars are high  Freestyle Elenor Legato shows some periods of hypoglycemia overnight preceded by significantly high readings and not clear if this is related to mistimed boluses She has been taken off metformin because of tendency to lower readings overnight Overall since she has not taken Invokana her blood sugars are more difficult to control She does not want to take this because she reports persistent yeast  infections She is trying to go to the gym for exercise classes twice a week However has gained back 14 pounds, previously had lost 9 pounds        Side effects from medications have been: nausea and diarrhea from 1000 MG metformin  Compliance with the medical regimen: fair  Recent 2 weeks data from freestyle libre version 2 shows the following interpretation and statistics  She has significant periods of hyperglycemia midday and late evenings although data is incomplete and sparse in the last 4 days . Also she has a couple of episodes of low blood sugars around 3 AM and was around 7 AM preceded by high sugars. Blood sugars appear to rise significantly between 7 AM until 12 PM and also variably in the evenings Blood sugar spikes appear to be mostly postprandial although there is somewhat incomplete and not clear if her mealtimes are consistent Overnight blood sugars drop significantly on most days with blood sugars near normal between 4 AM-8 AM  CGM use % of time   2-week average/GV 181/39  Time in range       54%  % Time Above 180 26+18  % Time above 250   % Time Below 70 2     PRE-MEAL Fasting Lunch Dinner 10-11:59 PM Overall  Glucose range:  Averages: 106 200  235    POST-MEAL PC Breakfast PC Lunch PC Dinner  Glucose range:     Averages:  268 209    PREVIOUS data:  CGM use % of time  63  2-week average/SD  151  Time in range  68       %  % Time Above 180  29  % Time above 250   % Time Below 70 3     PRE-MEAL Fasting Lunch Dinner Bedtime Overall  Glucose range:       Averages:  92  165  171  196 151   POST-MEAL PC Breakfast PC Lunch PC Dinner  Glucose range:     Averages:  151  193  205     Self-care:   Meal times are:  Breakfast is at 10 AM, dinner 8, lunch 2-3 pm  Typical meal intake: Breakfast is toast, oatmeal, sometimes bacon, milk   Lunch: Sometimes hot dogs, fish sandwich Evening meal is a protein like chicken, steak, pork chop, corn, greens.            Dietician visit, most recent: 2013 CDE visit: 12/2015   Weight history:  Wt Readings from Last 3 Encounters:  10/01/20 164 lb 6.4 oz (74.6 kg)  08/12/20 161 lb (73 kg)  06/09/20 150 lb 3.2 oz (68.1 kg)    Glycemic control:   Lab Results  Component Value Date   HGBA1C 7.6 (A) 10/01/2020   HGBA1C 8.1 (A) 06/09/2020   HGBA1C 7.2 (A) 02/17/2020   Lab Results  Component Value Date   MICROALBUR 0.8 10/01/2020   LDLCALC 111 (H) 10/01/2020   CREATININE 0.85 10/01/2020   Lab Results  Component Value Date   MICRALBCREAT 0.7 10/01/2020    Other active problems: See review of systems   Allergies as of 10/01/2020       Reactions   Ace Inhibitors Cough   Wheat Bran Hives   Gramineae Pollens         Medication List        Accurate as of October 01, 2020 11:59 PM. If you have any questions, ask your nurse or doctor.          acetaminophen 500 MG tablet Commonly known as: TYLENOL Take 1,000 mg by mouth 2 (two) times daily. As needed   amLODipine 10 MG tablet Commonly known as: NORVASC Take 1 tablet (10 mg total) by mouth daily.   canagliflozin 300 MG Tabs tablet Commonly known as: Invokana Take 1 tablet (300 mg total) by mouth daily before breakfast.   FreeStyle Libre 14 Day Reader Kerrin Mo See admin instructions.   FreeStyle Libre 2 Sensor Misc APPLY ONE SENSOR TO BODY ONCE [id number] DAYS TO MONITOR BLOOD SUGAR.   GINKGO BILOBA COMPLEX PO Take by mouth.   insulin aspart 100 UNIT/ML injection Commonly known as: novoLOG Use max of 78 units daily via insulin pump. Dx code E11.8   losartan 100 MG tablet Commonly known as: COZAAR Take 1 tablet by mouth once daily   meloxicam 15 MG tablet Commonly known as: MOBIC One tablet daily as needed for arthritis pain   NON FORMULARY CPAP @@ 9 cm H2O nightly   omeprazole 20 MG capsule Commonly known as: PRILOSEC Take 1 capsule (20 mg total) by mouth daily.   ondansetron 8 MG disintegrating tablet Commonly  known as: Zofran ODT Take 1 tablet (8 mg total) by mouth every 8 (eight) hours as needed for nausea or vomiting.  OneTouch Verio test strip Generic drug: glucose blood Use to test blood sugar 4 times daily   OneTouch Verio w/Device Kit 1 each by Does not apply route 4 (four) times daily.   pravastatin 20 MG tablet Commonly known as: PRAVACHOL Take 20 mg by mouth. three times weekly.   promethazine-dextromethorphan 6.25-15 MG/5ML syrup Commonly known as: PROMETHAZINE-DM Take 5 mLs by mouth 4 (four) times daily as needed for cough.   V-Go 20 Kit 1  ONCE DAILY   Vitamin D 50 MCG (2000 UT) Caps Take 1 capsule (2,000 Units total) by mouth daily.        Allergies:  Allergies  Allergen Reactions   Ace Inhibitors Cough   Wheat Bran Hives   Gramineae Pollens     Past Medical History:  Diagnosis Date   Allergy    Arthritis    Cataract    Diabetes mellitus without complication (HCC)    Hyperlipidemia    Hypertension    Motion sickness    planes   OSA (obstructive sleep apnea) 08/23/2016   CPAP   Osteoporosis    Presence of dental prosthetic device    implants   Radiculopathy of lumbosacral region 06/22/2016   Right lateral epicondylitis 06/04/2017   Right sided sciatica 03/09/2016   Sleep apnea     Past Surgical History:  Procedure Laterality Date   ABDOMINAL HYSTERECTOMY     CATARACT EXTRACTION Right 2020   COLONOSCOPY WITH PROPOFOL N/A 10/10/2019   Procedure: COLONOSCOPY WITH PROPOFOL;  Surgeon: Lucilla Lame, MD;  Location: [place];  Service: Endoscopy;  Laterality: N/A;   ESOPHAGOGASTRODUODENOSCOPY (EGD) WITH PROPOFOL N/A 10/10/2019   Procedure: ESOPHAGOGASTRODUODENOSCOPY (EGD) WITH PROPOFOL;  Surgeon: Lucilla Lame, MD;  Location: [place];  Service: Endoscopy;  Laterality: N/A;  Diabetic - insulin pump and oral meds sleep apnea   POLYPECTOMY  10/10/2019   Procedure: POLYPECTOMY;  Surgeon: Lucilla Lame, MD;  Location: [place];   Service: Endoscopy;;    Family History  Problem Relation Age of Onset   Breast cancer Other 95   Alcohol abuse Father    Mental illness Father    Stroke Father    Diabetes Sister    Asthma Brother    Diabetes Brother    Diabetes Maternal Aunt    Heart disease Maternal Aunt    Diabetes Maternal Uncle    Early death Maternal Uncle    Breast cancer Cousin 6       mat cousin    Social History:  reports that she has never smoked. She has never used smokeless tobacco. She reports that she does not drink alcohol and does not use drugs.   Review of Systems  Lipid history: She is is taking pravastatin as prescribed using 20 mg, tolerating this 3 days a week as recommended  Last LDL:  Lab Results  Component Value Date   CHOL 211 (H) 10/01/2020   CHOL 180 02/17/2020   CHOL 202 (H) 10/22/2019   Lab Results  Component Value Date   HDL 87.00 10/01/2020   HDL 74.70 02/17/2020   HDL 80.10 10/22/2019   Lab Results  Component Value Date   LDLCALC 111 (H) 10/01/2020   LDLCALC 92 02/17/2020   LDLCALC 107 (H) 10/22/2019   Lab Results  Component Value Date   TRIG 63.0 10/01/2020   TRIG 65.0 02/17/2020   TRIG 72.0 10/22/2019   Lab Results  Component Value Date   CHOLHDL 2 10/01/2020   CHOLHDL 2 02/17/2020  CHOLHDL 3 10/22/2019   No results found for: LDLDIRECT          Hypertension: on treatment for several years, on amlodipine and losartan   Has been followed by PCP  BP Readings from Last 3 Encounters:  10/01/20 140/70  08/12/20 124/82  06/09/20 (!) 142/77   She takes meloxicam 15 mg for joint pains 2 or 3 times a week  Most recent foot exam: 12/2019  OSTEOPOROSIS: Followed by PCP, lowest T score was- 2.6 at the spine compared to -3.0 before She was previously taking alendronate Also has had longstanding vitamin D deficiency  Has been taking 4000 units of vitamin D3   Lab Results  Component Value Date   VD25OH 40.70 02/17/2020   VD25OH 28.44 (L)  10/22/2019   VD25OH 29.1 (L) 04/20/2017   VD25OH 24.92 (L) 05/18/2016   VD25OH 31.3 07/28/2015   VD25OH 26.9 (L) 03/17/2015   VD25OH 21.5 (L) 09/17/2014      Physical Examination:  BP 140/70   Pulse 63   Ht 5' 2"  (1.575 m)   Wt 164 lb 6.4 oz (74.6 kg)   SpO2 97%   BMI 30.07 kg/m        ASSESSMENT:  Diabetes type 2 on insulin  See history of present illness for detailed discussion of current diabetes management, blood sugar patterns and problems identified  She is currently on insulin only with the V-go pump  A1c is higher at 8.1  She still has difficulty with mealtime insulin coverage because of lack of boluses or forgetting the boluses until a while after eating Also not estimating her boluses adequately at times With doing a little better with changing her pump at the same time and a little more monitoring than before sugars are somewhat better However frequency of monitoring still inadequate and explained to her diet periods of hyperglycemia she has on her libre download  HYPERTENSION: Fair control   History of hypercholesterolemia: We will need follow-up labs today  PLAN:   She will start bolusing consistently before meals and at the start of the meal preferably May consider having her keep a written record of this May also likely benefit from another consultation with dietitian Since she refuses to consider Invokana and is not able to afford Ozempic and will hold off any additional treatment at this time  She will need to make sure she checks her blood sugar several times a day especially before and after each meal She does need to make sure she changes her pump consistently at the same time daily Discussed need to avoid boluses late at night to avoid overnight hypoglycemia Likely needs to take additional 1-2 clicks for usual meals that have carbohydrate Encourage her to be as active as possible with walking  Labs to be checked on the next  visit    Patient Instructions  2-3 clicks for any meal that has Carbs, bolus within 10 min of meals No clicks late at night for high sugars   change pump every 24 hours daily  Check blood sugars on waking up   Also check blood sugars about 2 hours after meals and do this after different meals by rotation  Recommended blood sugar levels on waking up are 90-130 and about 2 hours after meal is 130-180  Please bring your blood sugar monitor to each visit, thank you      Elayne Snare 10/03/2020, 10:28 AM   Note: This office note was prepared with Dragon voice recognition system  technology. Any transcriptional errors that result from this process are unintentional.

## 2020-10-05 ENCOUNTER — Encounter: Payer: Self-pay | Admitting: Internal Medicine

## 2020-10-21 ENCOUNTER — Telehealth: Payer: Self-pay | Admitting: Pharmacist

## 2020-10-21 NOTE — Progress Notes (Addendum)
[facility])                                            [place] Team                                        Statin Quality Measure Assessment    10/21/2020  Melinda Ford [birth year] [id number]    I am a [facility] clinical pharmacist that reviews patients for statin quality initiatives.     Per review of chart and payor information, patient has a diagnosis of diabetes a but has not filled a prescription for a statin in 2022..  This places patient into the SUPD (Statin Use In Patients with Diabetes)  measure for CMS.    I spoke with [name] via telephone today. She reports that she just filled a prescription for her Pravastatin 20 mg, however I can not verify a claim for the fill. The last refill I can confirm from available claims data is a #90 day supply on 01/27/2020 prescribed by Dr. Dwyane Dee.   I placed a call to [place] on file and confirmed the current prescription is expired and last fill date was 01/27/2020. Today I reviewed [name]'s last lipid profile with her and educated on LDL. Mrs. Mruk states that she needs to be more intentional on taking the Pravastatin 20 mg 2 to 3 times per week. Education provided on LDL, HDL and optimal levels of LDL for patients with a diagnosis of DM and 10 year ASCVD risk >7.5%.  [name] also asked about daily calcium and vitamin D recommendations. I provided [name] with the optimally daily recommended dose of calcium and vitamin D for patients with a diagnosis of osteoporosis: 1200 mg of calcium and 800 IU of Vitamin D split into two daily doses of calcium 600 mg and vitamin D 400 IU. I advised [name] that she should not combine her current Vitamin D with the new combination if she chooses to begin supplementing with a combination product.    The 10-year ASCVD risk score [name] DC Brooke Bonito., et al., 2013) is: 46.1%   Values used to calculate the score:     Age:  89 years     Sex: Female     Is Non-Hispanic African American: Yes     Diabetic: Yes     Tobacco smoker: No     Systolic Blood Pressure: 794 mmHg     Is BP treated: Yes     HDL Cholesterol: 87 mg/dL     Total Cholesterol: 211 mg/dL       Component Value Date/Time   CHOL 211 (H) 10/01/2020 1206   CHOL 196 05/17/2018 1103   TRIG 63.0 10/01/2020 1206   HDL 87.00 10/01/2020 1206   HDL 90 05/17/2018 1103   CHOLHDL 2 10/01/2020 1206   VLDL 12.6 10/01/2020 1206   LDLCALC 111 (H) 10/01/2020 1206   LDLCALC 97 05/17/2018 1103    Please consider the following recommendation:  At next office visit, please  issue a new prescription for Pravastatin 20 mg Take 1 tablet by mouth three times weekly (M, W, F)  Quantity: #39 Refills: 3  Thank you!

## 2020-10-22 ENCOUNTER — Telehealth: Payer: Self-pay

## 2020-10-22 NOTE — Telephone Encounter (Signed)
Called pt spoke to her husband Gwyndolyn Saxon. Let him know that we don't do Covid vaccines that he would need to go to a place like [facility] or [facility]. He verbalized understanding.

## 2020-10-22 NOTE — Telephone Encounter (Unsigned)
Copied from [place] [id number]. Topic: General - Other >> Oct 21, 2020  4:40 PM Tessa Lerner A wrote: Reason for CRM: Patient would like to be contacted regarding scheduling a COVID Booster appt around 11/09/20  Agent was unable to successfully schedule a booster appt at the time of call  Please contact to further discuss when possible

## 2020-11-08 ENCOUNTER — Other Ambulatory Visit: Payer: Self-pay | Admitting: Internal Medicine

## 2020-11-08 MED ORDER — FREESTYLE LIBRE 2 SENSOR MISC: 0 refills | Status: DC

## 2020-11-08 NOTE — Telephone Encounter (Signed)
  Notes to clinic:  Script was filled by a different provider  Review for refill    Requested Prescriptions  Pending Prescriptions Disp Refills   Continuous Blood Gluc Sensor (FREESTYLE LIBRE 2 SENSOR) MISC 2 each 0    Sig: APPLY ONE SENSOR TO BODY ONCE [id number] DAYS TO MONITOR BLOOD SUGAR.      Endocrinology: Diabetes - Testing Supplies Passed - 11/08/2020 11:42 AM      Passed - Valid encounter within last 12 months    Recent Outpatient Visits           2 months ago [facility] Glean Hess, MD   2 months ago Bursitis of left hip, unspecified bursa   [facility] Glean Hess, MD   8 months ago Acute pain of right knee   [facility] Glean Hess, MD   11 months ago Annual physical exam   [facility] Glean Hess, MD   1 year ago Age-related osteoporosis without current pathological fracture   [facility] Glean Hess, MD       Future Appointments             In 1 month Army Melia Jesse Sans, MD [facility], [facility]

## 2020-11-08 NOTE — Telephone Encounter (Signed)
Medication Refill - Medication: Continuous Blood Gluc Sensor (FREESTYLE LIBRE 2 SENSOR) MISC Pt asked for this to be sent today / please advise   Has the patient contacted their pharmacy? Yes.   (Agent: If no, request that the patient contact the pharmacy for the refill.) (Agent: If yes, when and what did the pharmacy advise?)  Preferred Pharmacy (with phone number or street name): [place], [place] - [place]  [address] [place] [address]  Phone:  [phone]  Fax:  [phone]   Agent: Please be advised that RX refills may take up to 3 business days. We ask that you follow-up with your pharmacy.

## 2020-11-24 ENCOUNTER — Other Ambulatory Visit: Payer: Self-pay | Admitting: Endocrinology

## 2020-11-25 ENCOUNTER — Other Ambulatory Visit: Payer: Self-pay

## 2020-11-25 DIAGNOSIS — Z794 Long term (current) use of insulin: Secondary | ICD-10-CM

## 2020-11-25 DIAGNOSIS — E1165 Type 2 diabetes mellitus with hyperglycemia: Secondary | ICD-10-CM

## 2020-11-25 DIAGNOSIS — E118 Type 2 diabetes mellitus with unspecified complications: Secondary | ICD-10-CM

## 2020-11-25 MED ORDER — V-GO 20 KIT: PACK | 11 refills | Status: DC

## 2020-11-29 ENCOUNTER — Other Ambulatory Visit: Payer: Self-pay | Admitting: Endocrinology

## 2020-11-30 ENCOUNTER — Other Ambulatory Visit: Payer: Self-pay | Admitting: Internal Medicine

## 2020-12-08 ENCOUNTER — Other Ambulatory Visit: Payer: Self-pay

## 2020-12-08 DIAGNOSIS — E1165 Type 2 diabetes mellitus with hyperglycemia: Secondary | ICD-10-CM

## 2020-12-08 DIAGNOSIS — Z794 Long term (current) use of insulin: Secondary | ICD-10-CM

## 2020-12-08 MED ORDER — INSULIN ASPART 100 UNIT/ML IJ SOLN
INTRAMUSCULAR | 2 refills | Status: DC
Start: 2020-12-08 — End: 2021-04-12

## 2020-12-21 ENCOUNTER — Ambulatory Visit: Payer: HMO | Admitting: Podiatry

## 2020-12-27 DIAGNOSIS — M79674 Pain in right toe(s): Secondary | ICD-10-CM | POA: Diagnosis not present

## 2020-12-27 DIAGNOSIS — M79675 Pain in left toe(s): Secondary | ICD-10-CM | POA: Diagnosis not present

## 2020-12-27 DIAGNOSIS — B351 Tinea unguium: Secondary | ICD-10-CM | POA: Diagnosis not present

## 2020-12-28 ENCOUNTER — Encounter: Payer: Self-pay | Admitting: Internal Medicine

## 2020-12-28 ENCOUNTER — Telehealth: Payer: Self-pay

## 2020-12-28 ENCOUNTER — Other Ambulatory Visit: Payer: Self-pay

## 2020-12-28 ENCOUNTER — Ambulatory Visit (INDEPENDENT_AMBULATORY_CARE_PROVIDER_SITE_OTHER): Payer: HMO | Admitting: Internal Medicine

## 2020-12-28 VITALS — BP 118/68 | HR 78 | Ht 62.0 in | Wt 164.2 lb

## 2020-12-28 DIAGNOSIS — I10 Essential (primary) hypertension: Secondary | ICD-10-CM

## 2020-12-28 DIAGNOSIS — E118 Type 2 diabetes mellitus with unspecified complications: Secondary | ICD-10-CM | POA: Diagnosis not present

## 2020-12-28 DIAGNOSIS — B351 Tinea unguium: Secondary | ICD-10-CM | POA: Diagnosis not present

## 2020-12-28 DIAGNOSIS — Z23 Encounter for immunization: Secondary | ICD-10-CM

## 2020-12-28 DIAGNOSIS — E559 Vitamin D deficiency, unspecified: Secondary | ICD-10-CM | POA: Diagnosis not present

## 2020-12-28 DIAGNOSIS — Z0000 Encounter for general adult medical examination without abnormal findings: Secondary | ICD-10-CM

## 2020-12-28 DIAGNOSIS — M810 Age-related osteoporosis without current pathological fracture: Secondary | ICD-10-CM

## 2020-12-28 DIAGNOSIS — E1169 Type 2 diabetes mellitus with other specified complication: Secondary | ICD-10-CM | POA: Diagnosis not present

## 2020-12-28 DIAGNOSIS — K219 Gastro-esophageal reflux disease without esophagitis: Secondary | ICD-10-CM | POA: Insufficient documentation

## 2020-12-28 DIAGNOSIS — E785 Hyperlipidemia, unspecified: Secondary | ICD-10-CM

## 2020-12-28 NOTE — Telephone Encounter (Signed)
Patient came back out from visit today to ask if she should stop taking the pravastatin for 3 months to take meds for toenail fungus.   Please advise?

## 2020-12-28 NOTE — Patient Instructions (Addendum)
Take calcium 1200 mg per day and Vitamin D 2000 IU daily  Take Omeprazole daily for upper abdominal symptoms.  If not improving, follow up.

## 2020-12-28 NOTE — Telephone Encounter (Signed)
Left VM informing pt of this information from Dr. Army Melia.

## 2020-12-28 NOTE — Progress Notes (Signed)
Date:  12/28/2020   Name:  Melinda Ford   DOB:  [birth year]   MRN:  [id number]   Chief Complaint: Annual Exam (Toe Fungus. Wants to hold on pravastatin, and start Lamasil for toe infection. ) Melinda Ford is a 75 y.o. female who presents today for her Complete Annual Exam. She feels well. She reports exercising - walking 2-3 times weekly. She reports she is sleeping fairly well. Breast complaints - none.  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  Immunization History  Administered Date(s) Administered   Fluad Quad(high Dose 65+) 12/09/2019   Influenza, High Dose Seasonal PF 01/25/2016, 01/22/2017, 01/18/2018   Influenza,inj,Quad PF,6+ Mos 02/24/2014, 01/15/2015   Influenza-Unspecified 01/09/2011, 12/15/2011   Moderna Sars-Covid-2 Vaccination 05/09/2019, 06/06/2019, 02/10/2020   Pneumococcal Conjugate-13 04/16/2014   Pneumococcal Polysaccharide-23 05/06/2010   Tdap 01/22/2017   Zoster Recombinat (Shingrix) 01/18/2018, 11/12/2018   Zoster, Live 10/25/2010    Diabetes She presents for her follow-up (followed by Endocrinology) diabetic visit. She has type 2 diabetes mellitus. Her disease course has been stable. Pertinent negatives for hypoglycemia include no dizziness, headaches, nervousness/anxiousness or tremors. Pertinent negatives for diabetes include no chest pain, no fatigue, no polydipsia and no polyuria.  Hyperlipidemia This is a chronic problem. The problem is controlled. Pertinent negatives include no chest pain or shortness of breath. Current antihyperlipidemic treatment includes statins (but wants to take Lamisil for nail fungus).  Hypertension This is a chronic problem. The problem is controlled. Pertinent negatives include no chest pain, headaches, palpitations or shortness of breath. Past treatments include angiotensin blockers and calcium channel blockers. The current treatment provides significant  improvement. There are no compliance problems.   Abdominal Pain This is a new problem. The current episode started more than 1 month ago. The problem occurs daily. The problem has been unchanged. The pain is located in the epigastric region. The pain is mild. The quality of the pain is a sensation of fullness. The abdominal pain does not radiate. Pertinent negatives include no arthralgias, constipation, diarrhea, dysuria, fever, frequency, headaches or vomiting.  OSA - dx'd around 2019.  Started on treatment but she has not been compliant with use.  Tried to get used to the face device but found her self taking it off in the night.  Lab Results  Component Value Date   CREATININE 0.85 10/01/2020   BUN 25 (H) 10/01/2020   NA 137 10/01/2020   K 4.4 10/01/2020   CL 105 10/01/2020   CO2 27 10/01/2020   Lab Results  Component Value Date   CHOL 211 (H) 10/01/2020   HDL 87.00 10/01/2020   LDLCALC 111 (H) 10/01/2020   TRIG 63.0 10/01/2020   CHOLHDL 2 10/01/2020   Lab Results  Component Value Date   TSH 1.540 05/17/2018   Lab Results  Component Value Date   HGBA1C 7.6 (A) 10/01/2020   Lab Results  Component Value Date   WBC 5.2 06/04/2019   HGB 14.7 06/04/2019   HCT 45.1 06/04/2019   MCV 92 06/04/2019   PLT 293 06/04/2019   Lab Results  Component Value Date   ALT 34 10/01/2020   AST 24 10/01/2020   ALKPHOS 76 10/01/2020   BILITOT 0.4 10/01/2020     Review of Systems  Constitutional:  Negative for chills, fatigue and fever.  HENT:  Negative for congestion, hearing loss, tinnitus, trouble swallowing and voice change.   Eyes:  Negative for visual disturbance.  Respiratory:  Negative for cough, chest tightness, shortness of breath and wheezing.   Cardiovascular:  Negative for chest pain, palpitations and leg swelling.  Gastrointestinal:  Positive for abdominal pain. Negative for constipation, diarrhea and vomiting.  Endocrine: Negative for polydipsia and polyuria.   Genitourinary:  Negative for dysuria, frequency, genital sores, vaginal bleeding and vaginal discharge.  Musculoskeletal:  Negative for arthralgias, gait problem and joint swelling.  Skin:  Negative for color change and rash.  Neurological:  Negative for dizziness, tremors, light-headedness and headaches.  Hematological:  Negative for adenopathy. Does not bruise/bleed easily.  Psychiatric/Behavioral:  Negative for dysphoric mood and sleep disturbance. The patient is not nervous/anxious.    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 02/22/2018   Chronic midline thoracic back pain 02/22/2018   Tinea versicolor 09/20/2016   OSA (obstructive sleep apnea) 08/23/2016   Allergic rhinitis 07/13/2016   Overweight (BMI 25.0-29.9) 10/01/2015   Vitamin D deficiency 09/18/2014   Onychomycosis 09/16/2014   Degenerative disc disease, lumbar 09/16/2014   Benign essential HTN 10/10/2010   Osteoporosis 10/10/2010    Allergies  Allergen Reactions   Ace Inhibitors Cough   Wheat Bran Hives   Gramineae Pollens     Past Surgical History:  Procedure Laterality Date   ABDOMINAL HYSTERECTOMY     CATARACT EXTRACTION Right 2020   COLONOSCOPY WITH PROPOFOL N/A 10/10/2019   Procedure: COLONOSCOPY WITH PROPOFOL;  Surgeon: Lucilla Lame, MD;  Location: [facility];  Service: Endoscopy;  Laterality: N/A;   ESOPHAGOGASTRODUODENOSCOPY (EGD) WITH PROPOFOL N/A 10/10/2019   Procedure: ESOPHAGOGASTRODUODENOSCOPY (EGD) WITH PROPOFOL;  Surgeon: Lucilla Lame, MD;  Location: [facility];  Service: Endoscopy;  Laterality: N/A;  Diabetic - insulin pump and  oral meds sleep apnea   POLYPECTOMY  10/10/2019   Procedure: POLYPECTOMY;  Surgeon: Lucilla Lame, MD;  Location: [facility];  Service: Endoscopy;;    Social History   Tobacco Use   Smoking status: Never   Smokeless tobacco: Never  Vaping Use   Vaping Use: Never used  Substance Use Topics   Alcohol use: No    Alcohol/week: 1.0 standard drink    Types: 1 Standard drinks or equivalent per week   Drug use: No     Medication list has been reviewed and updated.  Current Meds  Medication Sig   acetaminophen (TYLENOL) 500 MG tablet Take 1,000 mg by mouth 2 (two) times daily. As needed   amLODipine (NORVASC) 10 MG tablet Take 1 tablet (10 mg total) by mouth daily.   Blood Glucose Monitoring Suppl (ONETOUCH VERIO) w/Device KIT 1 each by Does not apply route 4 (four) times daily.   canagliflozin (INVOKANA) 300 MG TABS tablet Take 1 tablet (300 mg total) by mouth daily before breakfast.   Cholecalciferol (VITAMIN D) 2000 UNITS CAPS Take 1 capsule (2,000 Units total) by mouth daily.   Continuous Blood Gluc Receiver (FREESTYLE LIBRE 14 DAY READER) DEVI See admin instructions.   Continuous Blood Gluc Sensor (FREESTYLE LIBRE 2 SENSOR) MISC APPLY ONE SENSOR TO BODY ONCE EVERY 14 DAYS TO MONITOR BLOOD SUGAR   GINKGO BILOBA COMPLEX PO Take by mouth.  glucose blood (ONETOUCH VERIO) test strip Use to test blood sugar 4 times daily   insulin aspart (NOVOLOG) 100 UNIT/ML injection USE A MAX OF 78 UNITS DAILY VIA INSULIN PUMP   Insulin Disposable Pump (V-GO 20) KIT 1  ONCE DAILY   losartan (COZAAR) 100 MG tablet Take 1 tablet by mouth once daily   meloxicam (MOBIC) 15 MG tablet One tablet daily as needed for arthritis pain   NON FORMULARY CPAP @@ 9 cm H2O nightly   omeprazole (PRILOSEC) 20 MG capsule Take 1 capsule (20 mg total) by mouth daily.   ondansetron (ZOFRAN ODT) 8 MG disintegrating tablet Take 1 tablet (8 mg total) by mouth every 8 (eight) hours as needed for nausea or vomiting.    pravastatin (PRAVACHOL) 20 MG tablet Take 1 tablet by mouth once daily   [DISCONTINUED] promethazine-dextromethorphan (PROMETHAZINE-DM) 6.25-15 MG/5ML syrup Take 5 mLs by mouth 4 (four) times daily as needed for cough.    PHQ 2/9 Scores 12/28/2020 08/27/2020 08/12/2020 08/11/2020  PHQ - 2 Score 0 0 0 0  PHQ- 9 Score _0 -    GAD 7 : Generalized Anxiety Score 12/28/2020 08/27/2020 08/12/2020 03/09/2020  Nervous, Anxious, on Edge _1 0  Control/stop worrying 0 1 1 0  Worry too much - different things 0 1 1 0  Trouble relaxing 0 0 0 0  Restless 0 0 0 0  Easily annoyed or irritable 0 0 0 0  Afraid - awful might happen 0 0 0 0  Total GAD 7 Score _2 0  Anxiety Difficulty Not difficult at all - - Not difficult at all    BP Readings from Last 3 Encounters:  12/28/20 118/68  10/01/20 140/70  08/12/20 124/82    Physical Exam Vitals and nursing note reviewed.  Constitutional:      General: She is not in acute distress.    Appearance: She is well-developed.  HENT:     Head: Normocephalic and atraumatic.     Right Ear: Tympanic membrane and ear canal normal.     Left Ear: Tympanic membrane and ear canal normal.     Nose:     Right Sinus: No maxillary sinus tenderness.     Left Sinus: No maxillary sinus tenderness.  Eyes:     General: No scleral icterus.       Right eye: No discharge.        Left eye: No discharge.     Conjunctiva/sclera: Conjunctivae normal.  Neck:     Thyroid: No thyromegaly.     Vascular: No carotid bruit.  Cardiovascular:     Rate and Rhythm: Normal rate and regular rhythm.     Pulses: Normal pulses.     Heart sounds: Normal heart sounds.  Pulmonary:     Effort: Pulmonary effort is normal. No respiratory distress.     Breath sounds: No wheezing.  Chest:  Breasts:    Right: No mass, nipple discharge, skin change or tenderness.     Left: No mass, nipple discharge, skin change or tenderness.  Abdominal:     General: Bowel sounds are normal.      Palpations: Abdomen is soft.     Tenderness: There is no abdominal tenderness.  Musculoskeletal:     Cervical back: Normal range of motion. No erythema.     Right lower leg: No edema.     Left lower leg: No edema.  Lymphadenopathy:     Cervical: No cervical adenopathy.  Skin:    General: Skin is warm and dry.     Findings: No rash.  Neurological:     Mental Status: She is alert and oriented to person, place, and time.     Cranial Nerves: No cranial nerve deficit.     Sensory: No sensory deficit.     Deep Tendon Reflexes: Reflexes are normal and symmetric.  Psychiatric:        Attention and Perception: Attention normal.        Mood and Affect: Mood normal.    Wt Readings from Last 3 Encounters:  12/28/20 164 lb 3.2 oz (74.5 kg)  10/01/20 164 lb 6.4 oz (74.6 kg)  08/12/20 161 lb (73 kg)    BP 118/68   Pulse 78   Ht _0  (1.575 m)   Wt 164 lb 3.2 oz (74.5 kg)   BMI 30.03 kg/m   Assessment and Plan: 1. Annual physical exam Normal exam Work on healthy diet, exercise as able Screenings and immunizations are up to date  2. Benign essential HTN Clinically stable exam with well controlled BP. Tolerating medications without side effects at this time. Pt to continue current regimen and low sodium diet; benefits of regular exercise as able discussed. - CBC with Differential/Platelet - TSH  3. Type II diabetes mellitus with complication (HCC) Managed by Endo Recent A1C acceptable. Should try [place]ade in place of Invokana  4. Hyperlipidemia associated with type 2 diabetes mellitus ([facility]) Tolerating statin medication without side effects at this time LDL is at goal of < 70 on current dose Continue same therapy without change at this time. She may want to stop for three months in order to treat nail fungus with lamisil  5. Age-related osteoporosis without current pathological fracture Continue exercise as able Continue calcium and vitamin D daily Repeat DEXA in 2-3  years  6. Onychomycosis Treatment per Podiatry if Endo allows her to hold the statin  7. Gastroesophageal reflux disease, unspecified whether esophagitis present Symptoms well controlled on daily PPI. No red flag signs such as weight loss, n/v, melena.  She does have some epigastric bloating and discomfort and has hx of HH. Will continue omeprazole daily.  Follow up if sx persist.  8. Vitamin D deficiency Continue daily supplementation - VITAMIN D 25 Hydroxy (Vit-D Deficiency, Fractures)   Partially dictated using Editor, commissioning. Any errors are unintentional.  Halina Maidens, MD [facility]  12/28/2020

## 2020-12-29 LAB — CBC WITH DIFFERENTIAL/PLATELET
Basophils Absolute: 0 10*3/uL (ref 0.0–0.2)
Basos: 1 %
EOS (ABSOLUTE): 0.1 10*3/uL (ref 0.0–0.4)
Eos: 2 %
Hematocrit: 45.1 % (ref 34.0–46.6)
Hemoglobin: 14.7 g/dL (ref 11.1–15.9)
Immature Grans (Abs): 0 10*3/uL (ref 0.0–0.1)
Immature Granulocytes: 0 %
Lymphocytes Absolute: 2.1 10*3/uL (ref 0.7–3.1)
Lymphs: 41 %
MCH: 30.1 pg (ref 26.6–33.0)
MCHC: 32.6 g/dL (ref 31.5–35.7)
MCV: 92 fL (ref 79–97)
Monocytes Absolute: 0.4 10*3/uL (ref 0.1–0.9)
Monocytes: 8 %
Neutrophils Absolute: 2.5 10*3/uL (ref 1.4–7.0)
Neutrophils: 48 %
Platelets: 286 10*3/uL (ref 150–450)
RBC: 4.89 x10E6/uL (ref 3.77–5.28)
RDW: 12 % (ref 11.7–15.4)
WBC: 5.1 10*3/uL (ref 3.4–10.8)

## 2020-12-29 LAB — VITAMIN D 25 HYDROXY (VIT D DEFICIENCY, FRACTURES): Vit D, 25-Hydroxy: 34 ng/mL (ref 30.0–100.0)

## 2020-12-29 LAB — TSH: TSH: 1.65 u[IU]/mL (ref 0.450–4.500)

## 2021-01-04 ENCOUNTER — Other Ambulatory Visit: Payer: Self-pay

## 2021-01-04 ENCOUNTER — Ambulatory Visit (INDEPENDENT_AMBULATORY_CARE_PROVIDER_SITE_OTHER): Payer: HMO | Admitting: Endocrinology

## 2021-01-04 ENCOUNTER — Encounter: Payer: Self-pay | Admitting: Endocrinology

## 2021-01-04 VITALS — BP 150/82 | HR 57 | Ht 61.0 in | Wt 166.4 lb

## 2021-01-04 DIAGNOSIS — Z794 Long term (current) use of insulin: Secondary | ICD-10-CM | POA: Diagnosis not present

## 2021-01-04 DIAGNOSIS — E1165 Type 2 diabetes mellitus with hyperglycemia: Secondary | ICD-10-CM

## 2021-01-04 DIAGNOSIS — E1169 Type 2 diabetes mellitus with other specified complication: Secondary | ICD-10-CM

## 2021-01-04 DIAGNOSIS — E785 Hyperlipidemia, unspecified: Secondary | ICD-10-CM

## 2021-01-04 DIAGNOSIS — E559 Vitamin D deficiency, unspecified: Secondary | ICD-10-CM

## 2021-01-04 LAB — POCT GLYCOSYLATED HEMOGLOBIN (HGB A1C): Hemoglobin A1C: 7.5 % — AB (ref 4.0–5.6)

## 2021-01-04 MED ORDER — OZEMPIC (0.25 OR 0.5 MG/DOSE) 2 MG/1.5ML ~~LOC~~ SOPN: 0.5000 mg | PEN_INJECTOR | SUBCUTANEOUS | 2 refills | Status: DC

## 2021-01-04 NOTE — Progress Notes (Signed)
Patient ID: Melinda Ford, female   DOB: 21-Sep-1945, 75 y.o.   MRN: [id number]            Reason for Appointment: Follow-up for Type 2 Diabetes   History of Present Illness:          Date of diagnosis of type 2 diabetes mellitus: ?  2004        Background history:   She previously had been on metformin and also Avandia initially Detailed records of her care are not available prior to 2013 or so She was tried on Januvia in 2016 but this was not effective and this was stopped in 5/70 She was started on insulin in 2/16 with small doses of Lantus insulin Her A1c has been consistently over 8% since about 2013  Recent history:   INSULIN regimen is:   V-go 20 unit pump since 3/82/50, boluses 0-2 clicks with meals     Non-insulin hypoglycemic drugs the patient is taking are: None  Her A1c is 7.5  Current management, blood sugar patterns and problems identified: She has been checking her blood sugars regularly with her freestyle libre somewhat more than before  However she has not acting on her blood sugar patterns and still has significant postprandial hyperglycemia  However she still has significant variability from day-to-day   She appears to have frequently high blood sugars in the afternoon and late at night  This is despite not eating breakfast till about 11 AM and likely has a dawn phenomenon  She is afraid of low sugars overnight and likely does not bolus enough for her evening meal  However she said that she is hungry all the time is likely eating excessive snacks some nights causing blood sugars to be higher  Appears to have HYPOGLYCEMIA between about 2-8 AM especially if she has a near normal blood sugar at bedtime  She is eating oatmeal mostly for breakfast late morning but despite blood sugar going up with this she is only bolusing 2 or 3 clicks compared to 4 clicks with other meals  Previously did not continue Invokana because she had persistent yeast infections She is  trying to go to the gym for exercise classes at least twice a week and also some walking         Side effects from medications have been: nausea and diarrhea from 1000 MG metformin  Compliance with the medical regimen: fair  Recent 2 weeks data from freestyle libre version 2 shows the following interpretation and statistics  Accuracy of the CGM is not clear and she thinks that occasionally CGM may be a little higher than fingersticks and has not checked concomitantly recently  HYPERGLYCEMIA occurs most frequently in the early afternoon and late at night around midnight  However she has significant variability in the evenings and overnight  OVERNIGHT blood sugars are mostly high averaging 220 at midnight and decreasing to variable extent by morning  HYPOGLYCEMIA is mostly occurring around 3-7 AM and about twice a week, once this was preceded by significant hyperglycemia POST prandial readings: Blood sugars are mostly higher after breakfast and variably high after her evening meal occasionally going up over 350 she has significant periods of hyperglycemia midday and late evenings although data is incomplete and sparse in the last 4 days . TIME in range = 49% with 4% low sugars and 47% over 180   PRE-MEAL Fasting Lunch Dinner Bedtime Overall  Glucose range:       Mean/GV 114  188 199 180+/-40   POST-MEAL PC Breakfast PC Lunch PC Dinner  Glucose range:     Mean/median: 214  240   Previously:  CGM use % of time   2-week average/GV 181/39  Time in range       54%  % Time Above 180 26+18  % Time above 250   % Time Below 70 2     PRE-MEAL Fasting Lunch Dinner 10-11:59 PM Overall  Glucose range:       Averages: 106 200  235    POST-MEAL PC Breakfast PC Lunch PC Dinner  Glucose range:     Averages:  268 209      Self-care:   Meal times are:  Breakfast is at 10 AM, dinner 8, lunch 2-3 pm  Typical meal intake: Breakfast is toast, oatmeal, sometimes bacon, milk   Lunch:  Sometimes hot dogs, fish sandwich Evening meal is a protein like chicken, steak, pork chop, corn, greens.           Dietician visit, most recent: 2013 CDE visit: 12/2015   Weight history:  Wt Readings from Last 3 Encounters:  01/04/21 166 lb 6.4 oz (75.5 kg)  12/28/20 164 lb 3.2 oz (74.5 kg)  10/01/20 164 lb 6.4 oz (74.6 kg)    Glycemic control:   Lab Results  Component Value Date   HGBA1C 7.5 (A) 01/04/2021   HGBA1C 7.6 (A) 10/01/2020   HGBA1C 8.1 (A) 06/09/2020   Lab Results  Component Value Date   MICROALBUR 0.8 10/01/2020   LDLCALC 111 (H) 10/01/2020   CREATININE 0.85 10/01/2020   Lab Results  Component Value Date   MICRALBCREAT 0.7 10/01/2020    Other active problems: See review of systems   Allergies as of 01/04/2021       Reactions   Ace Inhibitors Cough   Wheat Bran Hives   Gramineae Pollens         Medication List        Accurate as of January 04, 2021  3:31 PM. If you have any questions, ask your nurse or doctor.          acetaminophen 500 MG tablet Commonly known as: TYLENOL Take 1,000 mg by mouth 2 (two) times daily. As needed   amLODipine 10 MG tablet Commonly known as: NORVASC Take 1 tablet (10 mg total) by mouth daily.   canagliflozin 300 MG Tabs tablet Commonly known as: Invokana Take 1 tablet (300 mg total) by mouth daily before breakfast.   FreeStyle Libre 14 Day Reader Kerrin Mo See admin instructions.   FreeStyle Libre 2 Sensor Misc APPLY ONE SENSOR TO BODY ONCE EVERY 14 DAYS TO MONITOR BLOOD SUGAR   GINKGO BILOBA COMPLEX PO Take by mouth.   insulin aspart 100 UNIT/ML injection Commonly known as: novoLOG USE A MAX OF 78 UNITS DAILY VIA INSULIN PUMP   losartan 100 MG tablet Commonly known as: COZAAR Take 1 tablet by mouth once daily   meloxicam 15 MG tablet Commonly known as: MOBIC One tablet daily as needed for arthritis pain   NON FORMULARY CPAP @@ 9 cm H2O nightly   omeprazole 20 MG capsule Commonly known as:  PRILOSEC Take 1 capsule (20 mg total) by mouth daily.   ondansetron 8 MG disintegrating tablet Commonly known as: Zofran ODT Take 1 tablet (8 mg total) by mouth every 8 (eight) hours as needed for nausea or vomiting.   OneTouch Verio test strip Generic drug: glucose blood Use to test blood sugar  4 times daily   OneTouch Verio w/Device Kit 1 each by Does not apply route 4 (four) times daily.   Ozempic (0.25 or 0.5 MG/DOSE) 2 MG/1.5ML Sopn Generic drug: Semaglutide(0.25 or 0.5MG/DOS) Inject 0.5 mg into the skin once a week. Started by: Elayne Snare, MD   pravastatin 20 MG tablet Commonly known as: PRAVACHOL Take 1 tablet by mouth once daily   V-Go 20 Kit 1  ONCE DAILY   Vitamin D 50 MCG (2000 UT) Caps Take 1 capsule (2,000 Units total) by mouth daily.        Allergies:  Allergies  Allergen Reactions   Ace Inhibitors Cough   Wheat Bran Hives   Gramineae Pollens     Past Medical History:  Diagnosis Date   Allergy    Arthritis    Cataract    Diabetes mellitus without complication (HCC)    Hyperlipidemia    Hypertension    Motion sickness    planes   OSA (obstructive sleep apnea) 08/23/2016   CPAP   Osteoporosis    Presence of dental prosthetic device    implants   Radiculopathy of lumbosacral region 06/22/2016   Right lateral epicondylitis 06/04/2017   Right sided sciatica 03/09/2016   Sleep apnea     Past Surgical History:  Procedure Laterality Date   ABDOMINAL HYSTERECTOMY     CATARACT EXTRACTION Right 2020   COLONOSCOPY WITH PROPOFOL N/A 10/10/2019   Procedure: COLONOSCOPY WITH PROPOFOL;  Surgeon: Lucilla Lame, MD;  Location: [facility];  Service: Endoscopy;  Laterality: N/A;   ESOPHAGOGASTRODUODENOSCOPY (EGD) WITH PROPOFOL N/A 10/10/2019   Procedure: ESOPHAGOGASTRODUODENOSCOPY (EGD) WITH PROPOFOL;  Surgeon: Lucilla Lame, MD;  Location: [facility];  Service: Endoscopy;  Laterality: N/A;  Diabetic - insulin pump and oral meds sleep apnea    POLYPECTOMY  10/10/2019   Procedure: POLYPECTOMY;  Surgeon: Lucilla Lame, MD;  Location: [facility];  Service: Endoscopy;;    Family History  Problem Relation Age of Onset   Breast cancer Other 12   Alcohol abuse Father    Mental illness Father    Stroke Father    Diabetes Sister    Asthma Brother    Diabetes Brother    Diabetes Maternal Aunt    Heart disease Maternal Aunt    Diabetes Maternal Uncle    Early death Maternal Uncle    Breast cancer Cousin 11       mat cousin    Social History:  reports that she has never smoked. She has never used smokeless tobacco. She reports that she does not drink alcohol and does not use drugs.   Review of Systems  Lipid history: She is is taking pravastatin as prescribed using 20 mg, tolerating this 3 days a week without increased muscle aches  Last LDL:  Lab Results  Component Value Date   CHOL 211 (H) 10/01/2020   CHOL 180 02/17/2020   CHOL 202 (H) 10/22/2019   Lab Results  Component Value Date   HDL 87.00 10/01/2020   HDL 74.70 02/17/2020   HDL 80.10 10/22/2019   Lab Results  Component Value Date   LDLCALC 111 (H) 10/01/2020   LDLCALC 92 02/17/2020   LDLCALC 107 (H) 10/22/2019   Lab Results  Component Value Date   TRIG 63.0 10/01/2020   TRIG 65.0 02/17/2020   TRIG 72.0 10/22/2019   Lab Results  Component Value Date   CHOLHDL 2 10/01/2020   CHOLHDL 2 02/17/2020   CHOLHDL 3 10/22/2019  No results found for: LDLDIRECT          Hypertension: on treatment for several years, on amlodipine and losartan   Has been followed by PCP  BP Readings from Last 3 Encounters:  01/04/21 (!) 150/82  12/28/20 118/68  10/01/20 140/70     Most recent foot exam: 12/2019  OSTEOPOROSIS: Followed by PCP, lowest T score was- 2.6 at the spine compared to -3.0 before She was previously taking alendronate Also has had longstanding vitamin D deficiency  Has been taking 4000 units of vitamin D3   Lab Results  Component  Value Date   VD25OH 34.0 12/28/2020   VD25OH 40.70 02/17/2020   VD25OH 28.44 (L) 10/22/2019   VD25OH 29.1 (L) 04/20/2017   VD25OH 24.92 (L) 05/18/2016   VD25OH 31.3 07/28/2015   VD25OH 26.9 (L) 03/17/2015   VD25OH 21.5 (L) 09/17/2014      Physical Examination:  BP (!) 150/82   Pulse (!) 57   Ht 5' 1"  (1.549 m)   Wt 166 lb 6.4 oz (75.5 kg)   SpO2 98%   BMI 31.44 kg/m        ASSESSMENT:  Diabetes type 2 on insulin  See history of present illness for detailed discussion of current diabetes management, blood sugar patterns and problems identified  She is currently on insulin only with the V-go pump  A1c is relatively better at 7.6  However her freestyle Elenor Legato shows an average of 180 recently with somewhat incomplete monitoring Also not clear if her Elenor Legato is consistently accurate  She still has difficulty with postprandial hyperglycemia at most times  Today showed her the report of her freestyle [employer] and where she is having hypoglycemic spikes  She is also having some dawn phenomenon causing blood sugar to go up as much as 40 mg in the mornings before breakfast  She is also concerned about her increased appetite which may be causing late night hyperglycemia from snacks  HYPERTENSION: Has variable control, continue follow-up with PCP  Vitamin D deficiency: Adequately supplemented  Hypercholesterolemia: Needs follow-up labs on the next visit, consider adding Zetia if LDL is still high  PLAN:   She will need to make sure she boluses right at her mealtimes more consistently  However for most meals she will need to bolus 4 clicks and for larger higher carbohydrate meals at dinner up to 6 clicks  Discussed improvement in postprandial hyperglycemia with GLP-1 drugs and this should also help her satiety, weight loss and improved overall control  As before the main difficulty in prescribing any medication is her out-of-pocket expense and not clear how much Ozempic  will cost  Explained to the patient what GLP-1 drugs are, the sites of actions and the body, reduction of hunger sensation and improved insulin secretion.  Discussed the benefit of weight loss with these medications. Explained possible side effects of OZEMPIC, most commonly nausea that usually improves over time.  Also explained safety information associated with the medication Demonstrated the medication injection device and injection technique to the patient.  To start the injections with the 0.25 mg dosage weekly for the first 4 injections and then go up to 0.5 mg weekly if no continued nausea She will let us know if she has any difficulties, also may consider patient assistance program  Labs to be checked on the next visit    Patient Instructions  Take 4-6 clicks at supper and take 4 at breakfast  Snack at bedtime if sugar <180  Check blood sugars on waking up 7 days a week  Also check blood sugars about 2 hours after meals and do this after different meals by rotation  Recommended blood sugar levels on waking up are 90-130 and about 2 hours after meal is 130-160  Please bring your blood sugar monitor to each visit, thank you  Start OZEMPIC injections by dialing 0.25 mg on the pen as shown once weekly on the same day of the week.   You may inject in the sides of the stomach, outer thigh or arm as indicated in the brochure given. If you have any difficulties using the pen see the video at [email or web address].[email or web address]  You will feel fullness of the stomach with starting the medication and should try to keep the portions at meals small.  You may experience nausea in the first few days which usually gets better over time    After 4 weeks increase the dose to 0.5 mg weekly  If you have any questions or persistent side effects please call the office   You may also talk to a nurse educator with [employer] at [phone] Useful website: [email or web address]          Elayne Snare 01/04/2021, 3:31 PM   Note: This office note was prepared with Dragon voice recognition system technology. Any transcriptional errors that result from this process are unintentional.

## 2021-01-04 NOTE — Patient Instructions (Signed)
Take 4-6 clicks at supper and take 4 at breakfast  Snack at bedtime if sugar <180  Check blood sugars on waking up 7 days a week  Also check blood sugars about 2 hours after meals and do this after different meals by rotation  Recommended blood sugar levels on waking up are 90-130 and about 2 hours after meal is 130-160  Please bring your blood sugar monitor to each visit, thank you  Start OZEMPIC injections by dialing 0.25 mg on the pen as shown once weekly on the same day of the week.   You may inject in the sides of the stomach, outer thigh or arm as indicated in the brochure given. If you have any difficulties using the pen see the video at [email or web address]  You will feel fullness of the stomach with starting the medication and should try to keep the portions at meals small.  You may experience nausea in the first few days which usually gets better over time    After 4 weeks increase the dose to 0.5 mg weekly  If you have any questions or persistent side effects please call the office   You may also talk to a nurse educator with [employer] at [phone] Useful website: [email or web address]

## 2021-01-04 NOTE — Progress Notes (Signed)
poc

## 2021-01-09 ENCOUNTER — Other Ambulatory Visit: Payer: Self-pay | Admitting: Internal Medicine

## 2021-01-09 NOTE — Telephone Encounter (Signed)
Requested Prescriptions  Pending Prescriptions Disp Refills  . Continuous Blood Gluc Sensor (FREESTYLE LIBRE 2 SENSOR) MISC [Pharmacy Med Name: FREESTYLE LIBRE 2 SENSOR KIT] 2 each 0    Sig: APPLY 1 SENSOR TO BODY EVERY 14 DAYS TO MONITOR BLOOD SUGAR     Endocrinology: Diabetes - Testing Supplies Passed - 01/09/2021  5:57 PM      Passed - Valid encounter within last 12 months    Recent Outpatient Visits          1 week ago Annual physical exam   [facility] Glean Hess, MD   4 months ago COVID-19   [facility] Glean Hess, MD   5 months ago Bursitis of left hip, unspecified bursa   [facility] Glean Hess, MD   10 months ago Acute pain of right knee   [facility] Glean Hess, MD   1 year ago Annual physical exam   [facility] Glean Hess, MD

## 2021-02-06 ENCOUNTER — Other Ambulatory Visit: Payer: Self-pay

## 2021-02-06 ENCOUNTER — Encounter: Payer: Self-pay | Admitting: Emergency Medicine

## 2021-02-06 ENCOUNTER — Ambulatory Visit
Admission: EM | Admit: 2021-02-06 | Discharge: 2021-02-06 | Disposition: A | Discharge status: Home / Self Care | Payer: HMO | Attending: Emergency Medicine | Admitting: Emergency Medicine

## 2021-02-06 DIAGNOSIS — J069 Acute upper respiratory infection, unspecified: Secondary | ICD-10-CM | POA: Diagnosis not present

## 2021-02-06 DIAGNOSIS — R079 Chest pain, unspecified: Secondary | ICD-10-CM | POA: Diagnosis not present

## 2021-02-06 DIAGNOSIS — S29019A Strain of muscle and tendon of unspecified wall of thorax, initial encounter: Secondary | ICD-10-CM | POA: Diagnosis not present

## 2021-02-06 MED ORDER — BENZONATATE 100 MG PO CAPS: 200.0000 mg | ORAL_CAPSULE | Freq: Three times a day (TID) | ORAL | 0 refills | Status: DC

## 2021-02-06 MED ORDER — IPRATROPIUM BROMIDE 0.06 % NA SOLN: 2.0000 | Freq: Four times a day (QID) | NASAL | 12 refills | Status: DC

## 2021-02-06 MED ORDER — DICLOFENAC SODIUM 1 % EX GEL: 2.0000 g | Freq: Four times a day (QID) | CUTANEOUS | 0 refills | Status: DC

## 2021-02-06 MED ORDER — BACLOFEN 10 MG PO TABS: 10.0000 mg | ORAL_TABLET | Freq: Three times a day (TID) | ORAL | 0 refills | Status: DC

## 2021-02-06 NOTE — Discharge Instructions (Signed)
Use the Atrovent nasal spray, 2 squirts in each nostril every 6 hours, as needed for runny nose and postnasal drip.  Use the Tessalon Perles every 8 hours during the day.  Take them with a small sip of water.  They may give you some numbness to the base of your tongue or a metallic taste in your mouth, this is normal.  Apply 2 grams of Diclofenac gel to your back 4 times a day as needed for pain and inflammation.  Take the Baclofen every 8 hours to help with muscle pain and tightness.   Apply moist heat to your back for 20 minutes at a time, 2-3 times a day as needed.   Return for reevaluation or see your primary care provider for any new or worsening symptoms.

## 2021-02-06 NOTE — ED Triage Notes (Signed)
Patient c/o left sided uprer back pain and left arm pain that started yesterday.  Patient denies chest pain or SOB.  Patient c/o cough and chest congestion that started this weekend.  Patient denies fevers.

## 2021-02-06 NOTE — ED Provider Notes (Signed)
[facility]    CSN: [id number] Arrival date & time: 02/06/21  6010      History   Chief Complaint Chief Complaint  Patient presents with   Back Pain   Arm Pain    HPI Melinda Ford is a 75 y.o. female.   HPI  50 old female here for evaluation of left upper back and left arm pain.  Patient reports that she developed left back and left upper arm pain yesterday that is constant in nature.  She denies any chest pain or shortness of breath.  She has been experiencing runny nose and nasal congestion, sore throat, and intermittently productive cough which is improving for the past 2 days.  Her symptoms started after returning from a homecoming event at her [employer].  She denies dizziness, sweating, nausea, ear pain or wheezing, GI complaints, or known sick contacts.  Past Medical History:  Diagnosis Date   Allergy    Arthritis    Cataract    Diabetes mellitus without complication ([place])    Hyperlipidemia    Hypertension    Motion sickness    planes   OSA (obstructive sleep apnea) 08/23/2016   CPAP   Osteoporosis    Presence of dental prosthetic device    implants   Radiculopathy of lumbosacral region 06/22/2016   Right lateral epicondylitis 06/04/2017   Right sided sciatica 03/09/2016   Sleep apnea     Patient Active Problem List   Diagnosis Date Noted   Gastroesophageal reflux disease 12/28/2020   Personal history of colonic polyps    Polyp of sigmoid colon    Abdominal pain, epigastric    Patient noncompliant with statin medication 09/12/2019   Type II diabetes mellitus with complication ([place]) 93/23/5573   Hyperlipidemia associated with type 2 diabetes mellitus ([place]) 05/17/2018   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 02/22/2018   Chronic midline thoracic back pain 02/22/2018   Tinea versicolor 09/20/2016   OSA (obstructive sleep apnea) 08/23/2016   Allergic rhinitis 07/13/2016   Overweight (BMI 25.0-29.9) 10/01/2015   Vitamin D  deficiency 09/18/2014   Onychomycosis 09/16/2014   Degenerative disc disease, lumbar 09/16/2014   Benign essential HTN 10/10/2010   Osteoporosis 10/10/2010    Past Surgical History:  Procedure Laterality Date   ABDOMINAL HYSTERECTOMY     CATARACT EXTRACTION Right 2020   COLONOSCOPY WITH PROPOFOL N/A 10/10/2019   Procedure: COLONOSCOPY WITH PROPOFOL;  Surgeon: Lucilla Lame, MD;  Location: [place];  Service: Endoscopy;  Laterality: N/A;   ESOPHAGOGASTRODUODENOSCOPY (EGD) WITH PROPOFOL N/A 10/10/2019   Procedure: ESOPHAGOGASTRODUODENOSCOPY (EGD) WITH PROPOFOL;  Surgeon: Lucilla Lame, MD;  Location: [place];  Service: Endoscopy;  Laterality: N/A;  Diabetic - insulin pump and oral meds sleep apnea   POLYPECTOMY  10/10/2019   Procedure: POLYPECTOMY;  Surgeon: Lucilla Lame, MD;  Location: [place];  Service: Endoscopy;;    OB History   No obstetric history on file.      Home Medications    Prior to Admission medications   Medication Sig Start Date End Date Taking? Authorizing Provider  amLODipine (NORVASC) 10 MG tablet Take 1 tablet (10 mg total) by mouth daily. 03/09/20  Yes Glean Hess, MD  baclofen (LIORESAL) 10 MG tablet Take 1 tablet (10 mg total) by mouth 3 (three) times daily. 02/06/21  Yes Margarette Canada, NP  benzonatate (TESSALON) 100 MG capsule Take 2 capsules (200 mg total) by mouth every 8 (eight) hours. 02/06/21  Yes  Margarette Canada, NP  Cholecalciferol (VITAMIN D) 2000 UNITS CAPS Take 1 capsule (2,000 Units total) by mouth daily. 09/18/14  Yes Plonk, Gwyndolyn Saxon, MD  diclofenac Sodium (VOLTAREN) 1 % GEL Apply 2 g topically 4 (four) times daily. 02/06/21  Yes Margarette Canada, NP  Evelina Bucy BILOBA COMPLEX PO Take by mouth.   Yes [provider]  insulin aspart (NOVOLOG) 100 UNIT/ML injection USE A MAX OF 78 UNITS DAILY VIA INSULIN PUMP 12/08/20  Yes Elayne Snare, MD  ipratropium (ATROVENT) 0.06 % nasal spray Place 2 sprays into both nostrils 4 (four)  times daily. 02/06/21  Yes Margarette Canada, NP  losartan (COZAAR) 100 MG tablet Take 1 tablet by mouth once daily 10/27/19  Yes Glean Hess, MD  meloxicam [facility]) 15 MG tablet One tablet daily as needed for arthritis pain 08/12/20  Yes Glean Hess, MD  omeprazole (PRILOSEC) 20 MG capsule Take 1 capsule (20 mg total) by mouth daily. 12/09/19  Yes Glean Hess, MD  pravastatin (PRAVACHOL) 20 MG tablet Take 1 tablet by mouth once daily 11/24/20  Yes Elayne Snare, MD  acetaminophen (TYLENOL) 500 MG tablet Take 1,000 mg by mouth 2 (two) times daily. As needed    [provider]  Blood Glucose Monitoring Suppl (ONETOUCH VERIO) w/Device KIT 1 each by Does not apply route 4 (four) times daily. 01/18/16   Elayne Snare, MD  Continuous Blood Gluc Receiver (FREESTYLE LIBRE 14 DAY READER) [place] See admin instructions. 02/13/18   [provider]  Continuous Blood Gluc Sensor (FREESTYLE LIBRE 2 SENSOR) MISC APPLY 1 SENSOR TO BODY EVERY 14 DAYS TO MONITOR BLOOD SUGAR 01/09/21   Glean Hess, MD  glucose blood [facility] VERIO) test strip Use to test blood sugar 4 times daily 06/08/20   Glean Hess, MD  Insulin Disposable Pump (V-GO 20) KIT 1  ONCE DAILY 11/25/20   Elayne Snare, MD  NON FORMULARY CPAP @@ 9 cm H2O nightly    [provider]  ondansetron (ZOFRAN ODT) 8 MG disintegrating tablet Take 1 tablet (8 mg total) by mouth every 8 (eight) hours as needed for nausea or vomiting. Patient not taking: Reported on 01/04/2021 09/09/20   Glean Hess, MD  Semaglutide,0.25 or 0.5MG/DOS, (OZEMPIC, 0.25 OR 0.5 MG/DOSE,) 2 MG/1.5ML SOPN Inject 0.5 mg into the skin once a week. 01/04/21   Elayne Snare, MD    Family History Family History  Problem Relation Age of Onset   Breast cancer Other 64   Alcohol abuse Father    Mental illness Father    Stroke Father    Diabetes Sister    Asthma Brother    Diabetes Brother    Diabetes Maternal Aunt    Heart disease Maternal Aunt     Diabetes Maternal Uncle    Early death Maternal Uncle    Breast cancer Cousin 61       mat cousin    Social History Social History   Tobacco Use   Smoking status: Never   Smokeless tobacco: Never  Vaping Use   Vaping Use: Never used  Substance Use Topics   Alcohol use: No    Alcohol/week: 1.0 standard drink    Types: 1 Standard drinks or equivalent per week   Drug use: No     Allergies   Ace inhibitors, Wheat bran, and Gramineae pollens   Review of Systems Review of Systems  Constitutional:  Negative for activity change, appetite change and fever.  HENT:  Positive for congestion,  rhinorrhea and sore throat. Negative for ear pain.   Respiratory:  Positive for cough. Negative for shortness of breath and wheezing.   Cardiovascular:  Negative for chest pain.  Gastrointestinal:  Negative for diarrhea, nausea and vomiting.  Musculoskeletal:  Positive for back pain.  Skin:  Negative for rash.  Hematological: Negative.   Psychiatric/Behavioral: Negative.      Physical Exam Triage Vital Signs ED Triage Vitals  Enc Vitals Group     BP 02/06/21 0847 (!) 184/77     Pulse Rate 02/06/21 0847 61     Resp 02/06/21 0847 14     Temp 02/06/21 0847 97.7 F (36.5 C)     Temp Source 02/06/21 0847 Oral     SpO2 02/06/21 0847 96 %     Weight 02/06/21 0843 160 lb (72.6 kg)     Height 02/06/21 0843 5' 2"  (1.575 m)     Head Circumference --      Peak Flow --      Pain Score 02/06/21 0843 6     Pain Loc --      Pain Edu? --      Excl. in [place]? --    No data found.  Updated Vital Signs BP (!) 184/77 (BP Location: Right Arm) Comment: Patient states that she has not taken her NP medicine today  Pulse 61   Temp 97.7 F (36.5 C) (Oral)   Resp 14   Ht 5' 2"  (1.575 m)   Wt 160 lb (72.6 kg)   SpO2 96%   BMI 29.26 kg/m   Visual Acuity Right Eye Distance:   Left Eye Distance:   Bilateral Distance:    Right Eye Near:   Left Eye Near:    Bilateral Near:     Physical  Exam Vitals and nursing note reviewed.  Constitutional:      General: She is not in acute distress.    Appearance: Normal appearance. She is not ill-appearing.  HENT:     Head: Normocephalic and atraumatic.     Right Ear: Tympanic membrane, ear canal and external ear normal. There is no impacted cerumen.     Left Ear: Tympanic membrane, ear canal and external ear normal. There is no impacted cerumen.     Nose: Congestion and rhinorrhea present.     Mouth/Throat:     Mouth: Mucous membranes are moist.     Pharynx: Oropharynx is clear. Posterior oropharyngeal erythema present.  Cardiovascular:     Rate and Rhythm: Normal rate and regular rhythm.     Pulses: Normal pulses.     Heart sounds: Normal heart sounds. No murmur heard.   No gallop.  Pulmonary:     Effort: Pulmonary effort is normal.     Breath sounds: Normal breath sounds. No wheezing, rhonchi or rales.  Musculoskeletal:        General: Swelling and tenderness present. No deformity.     Cervical back: Normal range of motion and neck supple.  Lymphadenopathy:     Cervical: No cervical adenopathy.  Skin:    General: Skin is warm and dry.     Capillary Refill: Capillary refill takes less than 2 seconds.     Findings: No erythema or rash.  Neurological:     General: No focal deficit present.     Mental Status: She is alert and oriented to person, place, and time.  Psychiatric:        Mood and Affect: Mood normal.  Behavior: Behavior normal.        Thought Content: Thought content normal.        Judgment: Judgment normal.     UC Treatments / Results  Labs (all labs ordered are listed, but only abnormal results are displayed) Labs Reviewed - No data to display  EKG Sinus bradycardia with a ventricular rate of 55 bpm PR interval 160 ms QRS duration 72 ms QT/QTc 482/461 ms No ST or T wave abnormalities appreciated.  No change when compared to EKG from 12/09/2019.   Radiology No results  found.  Procedures Procedures (including critical care time)  Medications Ordered in UC Medications - No data to display  Initial Impression / Assessment and Plan / UC Course  I have reviewed the triage vital signs and the nursing notes.  Pertinent labs & imaging results that were available during my care of the patient were reviewed by me and considered in my medical decision making (see chart for details).  Is a very pleasant, nontoxic-appearing 75 year old female here for evaluation of left upper back pain that radiates down her left arm and has been present since yesterday.  She is also been experiencing upper respiratory symptoms for the past 2 days to include runny nose, nasal congestion, sore throat which has resolved and productive cough which is resolving.  Patient's physical exam reveals pearly-gray tympanic membranes bilaterally with normal light reflex and clear external auditory canals.  Nasal mucosa is erythematous edematous with mild clear nasal discharge in both nares.  Oropharyngeal exam reveals mild posterior oropharyngeal erythema with clear postnasal drip.  No cervical lymphadenopathy appreciated exam.  Cardiopulmonary exam reveals S1 and S2 heart sounds that are free of murmur, rub, or ectopy and lung sounds are clear to auscultation all fields.  Patient does have some significant spasm in the mid thoracic left paraspinous region.  She has no tenderness with palpation of her tricep or bicep muscle complexes in her left arm.  No pain with palpation of the deltoid muscle.  EKG was collected at triage which shows sinus bradycardia and is unchanged when compared to EKG from 12/09/2019.  Suspect patient's back pain and arm pain are coming from a musculoskeletal source given the spasm in her left thoracic paraspinous region.  We will treat accordingly with topical Voltaren gel as patient has a history of hypertension and diabetes.  We will also treat with baclofen 10 mg 3 times a day to  help with the spasm.  Patient also has an upper respiratory infection which I believe is most likely viral and will give Atrovent nasal spray to help with nasal congestion and Tessalon Perles to help with the cough.  ER and return precautions reviewed with patient.   Final Clinical Impressions(s) / UC Diagnoses   Final diagnoses:  Viral URI with cough  Thoracic myofascial strain, initial encounter     Discharge Instructions      Use the Atrovent nasal spray, 2 squirts in each nostril every 6 hours, as needed for runny nose and postnasal drip.  Use the Tessalon Perles every 8 hours during the day.  Take them with a small sip of water.  They may give you some numbness to the base of your tongue or a metallic taste in your mouth, this is normal.  Apply 2 grams of Diclofenac gel to your back 4 times a day as needed for pain and inflammation.  Take the Baclofen every 8 hours to help with muscle pain and tightness.   Apply  moist heat to your back for 20 minutes at a time, 2-3 times a day as needed.   Return for reevaluation or see your primary care provider for any new or worsening symptoms.      ED Prescriptions     Medication Sig Dispense Auth. Provider   benzonatate (TESSALON) 100 MG capsule Take 2 capsules (200 mg total) by mouth every 8 (eight) hours. 21 capsule Margarette Canada, NP   baclofen (LIORESAL) 10 MG tablet Take 1 tablet (10 mg total) by mouth 3 (three) times daily. 30 each Margarette Canada, NP   ipratropium (ATROVENT) 0.06 % nasal spray Place 2 sprays into both nostrils 4 (four) times daily. 15 mL Margarette Canada, NP   diclofenac Sodium (VOLTAREN) 1 % GEL Apply 2 g topically 4 (four) times daily. 100 g Margarette Canada, NP      PDMP not reviewed this encounter.   Margarette Canada, NP 02/06/21 [phone]

## 2021-02-07 ENCOUNTER — Encounter: Payer: Self-pay | Admitting: Internal Medicine

## 2021-02-07 ENCOUNTER — Telehealth: Payer: Self-pay | Admitting: Internal Medicine

## 2021-02-07 ENCOUNTER — Ambulatory Visit (INDEPENDENT_AMBULATORY_CARE_PROVIDER_SITE_OTHER): Payer: HMO | Admitting: Internal Medicine

## 2021-02-07 VITALS — BP 130/88 | HR 81 | Ht 62.0 in | Wt 161.0 lb

## 2021-02-07 DIAGNOSIS — M62830 Muscle spasm of back: Secondary | ICD-10-CM | POA: Diagnosis not present

## 2021-02-07 NOTE — Telephone Encounter (Signed)
Patient called says will be at appt in the next 7 minutes

## 2021-02-07 NOTE — Progress Notes (Signed)
Date:  02/07/2021   Name:  Melinda Ford   DOB:  1945/11/08   MRN:  [id number]   Chief Complaint: Shoulder Pain  Shoulder Pain  The pain is present in the left shoulder and left arm (Radiates into Left Fingers with numbness and tingling.). This is a new problem. The current episode started in the past 7 days. There has been no history of extremity trauma. The problem occurs constantly. The problem has been gradually worsening. The quality of the pain is described as sharp. The pain is at a severity of 7/10. The pain is moderate. Associated symptoms include an inability to bear weight, a limited range of motion and numbness (tingling in left fingers). Pertinent negatives include no fever or joint swelling. The symptoms are aggravated by activity and contact. She has tried heat, acetaminophen and NSAIDS (Patient seen ED yesterday and was given Baclofen, and Diclofenac PRN for pain.) for the symptoms. The treatment provided no relief.   Lab Results  Component Value Date   CREATININE 0.85 10/01/2020   BUN 25 (H) 10/01/2020   NA 137 10/01/2020   K 4.4 10/01/2020   CL 105 10/01/2020   CO2 27 10/01/2020   Lab Results  Component Value Date   CHOL 211 (H) 10/01/2020   HDL 87.00 10/01/2020   LDLCALC 111 (H) 10/01/2020   TRIG 63.0 10/01/2020   CHOLHDL 2 10/01/2020   Lab Results  Component Value Date   TSH 1.650 12/28/2020   Lab Results  Component Value Date   HGBA1C 7.5 (A) 01/04/2021   Lab Results  Component Value Date   WBC 5.1 12/28/2020   HGB 14.7 12/28/2020   HCT 45.1 12/28/2020   MCV 92 12/28/2020   PLT 286 12/28/2020   Lab Results  Component Value Date   ALT 34 10/01/2020   AST 24 10/01/2020   ALKPHOS 76 10/01/2020   BILITOT 0.4 10/01/2020     Review of Systems  Constitutional:  Negative for chills and fever.  Respiratory:  Positive for cough. Negative for chest tightness, shortness of breath and wheezing.   Cardiovascular:  Negative for chest pain.   Musculoskeletal:  Positive for myalgias (under left shoulder blade).  Neurological:  Positive for numbness (tingling in left fingers).   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 02/22/2018   Chronic midline thoracic back pain 02/22/2018   Tinea versicolor 09/20/2016   OSA (obstructive sleep apnea) 08/23/2016   Allergic rhinitis 07/13/2016   Overweight (BMI 25.0-29.9) 10/01/2015   Vitamin D deficiency 09/18/2014   Onychomycosis 09/16/2014   Degenerative disc disease, lumbar 09/16/2014   Benign essential HTN 10/10/2010   Osteoporosis 10/10/2010    Allergies  Allergen Reactions   Ace Inhibitors Cough   Wheat Bran Hives   Gramineae Pollens     Past Surgical History:  Procedure Laterality Date   ABDOMINAL HYSTERECTOMY     CATARACT EXTRACTION Right 2020   COLONOSCOPY WITH PROPOFOL N/A 10/10/2019   Procedure: COLONOSCOPY WITH PROPOFOL;  Surgeon: Lucilla Lame, MD;  Location: [place];  Service: Endoscopy;  Laterality: N/A;   ESOPHAGOGASTRODUODENOSCOPY (  EGD) WITH PROPOFOL N/A 10/10/2019   Procedure: ESOPHAGOGASTRODUODENOSCOPY (EGD) WITH PROPOFOL;  Surgeon: Lucilla Lame, MD;  Location: [place];  Service: Endoscopy;  Laterality: N/A;  Diabetic - insulin pump and oral meds sleep apnea   POLYPECTOMY  10/10/2019   Procedure: POLYPECTOMY;  Surgeon: Lucilla Lame, MD;  Location: [place];  Service: Endoscopy;;    Social History   Tobacco Use   Smoking status: Never   Smokeless tobacco: Never  Vaping Use   Vaping Use: Never used  Substance Use Topics    Alcohol use: No    Alcohol/week: 1.0 standard drink    Types: 1 Standard drinks or equivalent per week   Drug use: No     Medication list has been reviewed and updated.  Current Meds  Medication Sig   acetaminophen (TYLENOL) 500 MG tablet Take 1,000 mg by mouth 2 (two) times daily. As needed   amLODipine (NORVASC) 10 MG tablet Take 1 tablet (10 mg total) by mouth daily.   baclofen (LIORESAL) 10 MG tablet Take 1 tablet (10 mg total) by mouth 3 (three) times daily.   benzonatate (TESSALON) 100 MG capsule Take 2 capsules (200 mg total) by mouth every 8 (eight) hours.   Blood Glucose Monitoring Suppl (ONETOUCH VERIO) w/Device KIT 1 each by Does not apply route 4 (four) times daily.   Cholecalciferol (VITAMIN D) 2000 UNITS CAPS Take 1 capsule (2,000 Units total) by mouth daily.   Continuous Blood Gluc Receiver (FREESTYLE LIBRE 14 DAY READER) [place] See admin instructions.   Continuous Blood Gluc Sensor (FREESTYLE LIBRE 2 SENSOR) MISC APPLY 1 SENSOR TO BODY EVERY 14 DAYS TO MONITOR BLOOD SUGAR   diclofenac Sodium (VOLTAREN) 1 % GEL Apply 2 g topically 4 (four) times daily.   [place] BILOBA COMPLEX PO Take by mouth.   glucose blood (ONETOUCH VERIO) test strip Use to test blood sugar 4 times daily   insulin aspart (NOVOLOG) 100 UNIT/ML injection USE A MAX OF 78 UNITS DAILY VIA INSULIN PUMP   Insulin Disposable Pump (V-GO 20) KIT 1  ONCE DAILY   ipratropium (ATROVENT) 0.06 % nasal spray Place 2 sprays into both nostrils 4 (four) times daily.   losartan (COZAAR) 100 MG tablet Take 1 tablet by mouth once daily   meloxicam (MOBIC) 15 MG tablet One tablet daily as needed for arthritis pain   NON FORMULARY CPAP @@ 9 cm H2O nightly   omeprazole (PRILOSEC) 20 MG capsule Take 1 capsule (20 mg total) by mouth daily.   ondansetron (ZOFRAN ODT) 8 MG disintegrating tablet Take 1 tablet (8 mg total) by mouth every 8 (eight) hours as needed for nausea or vomiting.   pravastatin (PRAVACHOL) 20 MG tablet Take 1  tablet by mouth once daily   Semaglutide,0.25 or 0.5MG/DOS, (OZEMPIC, 0.25 OR 0.5 MG/DOSE,) 2 MG/1.5ML SOPN Inject 0.5 mg into the skin once a week.    PHQ 2/9 Scores 12/28/2020 08/27/2020 08/12/2020 08/11/2020  PHQ - 2 Score 0 0 0 0  PHQ- 9 Score _0 -    GAD 7 : Generalized Anxiety Score 12/28/2020 08/27/2020 08/12/2020 03/09/2020  Nervous, Anxious, on Edge _1 0  Control/stop worrying 0 1 1 0  Worry too much - different things 0 1 1 0  Trouble relaxing 0 0 0 0  Restless 0 0 0 0  Easily annoyed or irritable 0 0 0 0  Afraid - awful might happen 0 0 0 0  Total GAD 7 Score 1 3  3 0  Anxiety Difficulty Not difficult at all - - Not difficult at all    BP Readings from Last 3 Encounters:  02/07/21 130/88  02/06/21 (!) 184/77  01/04/21 (!) 150/82    Physical Exam Vitals and nursing note reviewed.  Constitutional:      General: She is not in acute distress.    Appearance: She is well-developed.  HENT:     Head: Normocephalic and atraumatic.  Cardiovascular:     Rate and Rhythm: Normal rate and regular rhythm.     Pulses: Normal pulses.  Pulmonary:     Effort: Pulmonary effort is normal. No respiratory distress.     Breath sounds: No wheezing or rhonchi.  Musculoskeletal:     Cervical back: Normal range of motion.       Back:     Comments: Tenderness and spasm  Skin:    General: Skin is warm and dry.     Findings: No rash.  Neurological:     Mental Status: She is alert and oriented to person, place, and time.     Sensory: Sensation is intact.     Motor: Motor function is intact.     Deep Tendon Reflexes:     Reflex Scores:      Bicep reflexes are 2+ on the right side and 2+ on the left side. Psychiatric:        Mood and Affect: Mood normal.        Behavior: Behavior normal.    Wt Readings from Last 3 Encounters:  02/07/21 161 lb (73 kg)  02/06/21 160 lb (72.6 kg)  01/04/21 166 lb 6.4 oz (75.5 kg)    BP 130/88   Pulse 81   Ht _0  (1.575 m)   Wt 161 lb (73 kg)    SpO2 96%   BMI 29.45 kg/m   Assessment and Plan: 1. Back muscle spasm Continue Baclofen tid Continue Mobic daily Continue Voltaren gel every 6-8 hours Use ice or heat, support the head and neck Follow up if no improvement   Partially dictated using Editor, commissioning. Any errors are unintentional.  Halina Maidens, MD [facility]  02/07/2021

## 2021-02-08 ENCOUNTER — Telehealth: Payer: Self-pay

## 2021-02-08 NOTE — Telephone Encounter (Signed)
Called and spoke with patient. She is informed to try to stop Meloxicam and take ibuprofen 800 mg every 8 hrs PRN. Told her if no better after trying something different, I can get her in with our sports med physician for a new ortho 40 minute appt with Dr Zigmund Daniel.

## 2021-02-08 NOTE — Telephone Encounter (Signed)
Copied from [place] [phone]. Topic: General - Other >> Feb 08, 2021 12:27 PM Leward Quan A wrote: Reason for CRM: Patient called in to inquire of Dr Army Melia if she can take more than on of the meloxicam (MOBIC) 15 MG tablet maybe every 8 hrs  due to an increase with muscle spasms. Asking for a call back with an answer please today at  Ph#  [phone]

## 2021-02-10 ENCOUNTER — Ambulatory Visit
Admission: RE | Admit: 2021-02-10 | Discharge: 2021-02-10 | Disposition: A | Discharge status: Home / Self Care | Payer: HMO | Source: Ambulatory Visit | Attending: Family Medicine | Admitting: Family Medicine

## 2021-02-10 ENCOUNTER — Encounter: Payer: Self-pay | Admitting: Dietician

## 2021-02-10 ENCOUNTER — Other Ambulatory Visit: Payer: Self-pay

## 2021-02-10 ENCOUNTER — Ambulatory Visit
Admission: RE | Admit: 2021-02-10 | Discharge: 2021-02-10 | Disposition: A | Discharge status: Home / Self Care | Payer: HMO | Attending: Family Medicine | Admitting: Family Medicine

## 2021-02-10 ENCOUNTER — Encounter: Payer: Self-pay | Admitting: Family Medicine

## 2021-02-10 ENCOUNTER — Ambulatory Visit (INDEPENDENT_AMBULATORY_CARE_PROVIDER_SITE_OTHER): Payer: HMO | Admitting: Family Medicine

## 2021-02-10 ENCOUNTER — Encounter: Payer: HMO | Attending: Endocrinology | Admitting: Dietician

## 2021-02-10 VITALS — BP 178/98 | HR 80 | Ht 62.0 in | Wt 163.0 lb

## 2021-02-10 DIAGNOSIS — E118 Type 2 diabetes mellitus with unspecified complications: Secondary | ICD-10-CM | POA: Diagnosis not present

## 2021-02-10 DIAGNOSIS — M25512 Pain in left shoulder: Secondary | ICD-10-CM | POA: Diagnosis not present

## 2021-02-10 DIAGNOSIS — M4722 Other spondylosis with radiculopathy, cervical region: Secondary | ICD-10-CM | POA: Diagnosis not present

## 2021-02-10 DIAGNOSIS — M542 Cervicalgia: Secondary | ICD-10-CM | POA: Diagnosis not present

## 2021-02-10 DIAGNOSIS — M62838 Other muscle spasm: Secondary | ICD-10-CM | POA: Insufficient documentation

## 2021-02-10 MED ORDER — DICLOFENAC SODIUM 50 MG PO TBEC: 50.0000 mg | DELAYED_RELEASE_TABLET | Freq: Two times a day (BID) | ORAL | 0 refills | Status: DC

## 2021-02-10 MED ORDER — PREDNISONE 50 MG PO TABS: 50.0000 mg | ORAL_TABLET | Freq: Every day | ORAL | 0 refills | Status: DC

## 2021-02-10 MED ORDER — CYCLOBENZAPRINE HCL 5 MG PO TABS: 5.0000 mg | ORAL_TABLET | Freq: Every evening | ORAL | 0 refills | Status: DC | PRN

## 2021-02-10 MED ORDER — GABAPENTIN 600 MG PO TABS: 600.0000 mg | ORAL_TABLET | Freq: Every day | ORAL | 0 refills | Status: DC

## 2021-02-10 NOTE — Progress Notes (Signed)
Diabetes Self-Management Education  Visit Type: First/Initial  Appt. Start Time: 0910 Appt. End Time: 1020  02/20/2021  [name], identified by name and date of birth, is a 75 y.o. female with a diagnosis of Diabetes: Type 2.   ASSESSMENT Patient is here today with her husband.  She has been seen by Vaughan Basta, [facility] more recently and by myself in 2017. Patient states that she is having low blood sugar at about 56 about 3 times weekly. Patient states that we know what we should do but doing it consistently is difficult. Increased hunger and increased snacks. She complains of left radiating pain from shoulder down arm.  She states that she has been to the urgent care for this and they stated it was muscle spasms.  History includes Type 2 diabete,s HTN, HLD, OSA - "should wear c-pap" Medications include Novolg via a Vgo 20 (4 clicks per meal and forgets at the start of the meal at times).  She is not taking Ozempic due to cost Labs include:  A1C 7.5 01/04/2021, 7.6% 10/01/2020, vitamin D 34 12/28/2020, GFR 66 10/01/20, Cholesterol 211, HDL 87, LDL 111, Triglycerides 63 (10/01/2020)  Weight hx: 161 lbs 02/10/2021 166 lbs 01/04/2021 150 lbs prior to covid  Patient lives with her husband.  Their son is there currently as well.  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  Height 5\' 2"  (1.575 m), weight 161 lb (73 kg). Body mass index is 29.45 kg/m.   Diabetes Self-Management Education - 02/20/21 1400       Visit Information   Visit Type First/Initial      Initial Visit   Diabetes Type Type 2    Are you currently following a meal plan? No    Are you taking your medications as prescribed? Yes      Health Coping   How would you rate your overall health? Good      Psychosocial Assessment   Patient Belief/Attitude about Diabetes Defeat/Burnout    Self-care barriers None    Self-management support Doctor's office    Other  persons present Spouse/SO    Patient Concerns Nutrition/Meal planning    Special Needs None    Preferred Learning Style No preference indicated    Learning Readiness Ready    How often do you need to have someone help you when you read instructions, pamphlets, or other written materials from your doctor or pharmacy? 1 - Never    What is the last grade level you completed in school? PhD      Pre-Education Assessment   Patient understands the diabetes disease and treatment process. Needs Review    Patient understands incorporating nutritional management into lifestyle. Needs Review    Patient undertands incorporating physical activity into lifestyle. Needs Review    Patient understands using medications safely. Needs Review    Patient understands monitoring blood glucose, interpreting and using results Needs Review    Patient understands prevention, detection, and treatment of acute complications. Needs Review    Patient understands prevention, detection, and treatment of chronic complications. Needs Review    Patient understands how to develop strategies to address psychosocial issues. Needs Review    Patient understands how to develop strategies to promote health/change behavior. Needs Review      Complications   Last HgB A1C per patient/outside source 7.5 %    How often do you check your blood sugar? > 4 times/day  Fasting Blood glucose range (mg/dL) 70-129    Postprandial Blood glucose range (mg/dL) >200    Number of hypoglycemic episodes per month 12    Can you tell when your blood sugar is low? Yes    What do you do if your blood sugar is low? ice cream, chocolate milk, or OJ    Have you had a dilated eye exam in the past 12 months? Yes    Have you had a dental exam in the past 12 months? Yes    Are you checking your feet? Yes    How many days per week are you checking your feet? 4      Dietary Intake   Breakfast oeatmeal with salt and butter OR chocolate milk on gym days     Lunch hot dog on a bun, slaw, chili (restaurant) OR skips if she had oatmeal for breakfast   11:30-1   Snack (afternoon) NABS    Dinner meat, vegetables, or salad    Snack (evening) NABS or leftovers or ice cream    Beverage(s) water, flavoed water, diet/zero sprite      Exercise   Exercise Type Light (walking / raking leaves)    How many days per week to you exercise? 3    How many minutes per day do you exercise? 45    Total minutes per week of exercise 135      Patient Education   Previous Diabetes Education Yes (please comment)   2017   Disease state  Other (comment)    Nutrition management  Food label reading, portion sizes and measuring food.;Reviewed blood glucose goals for pre and post meals and how to evaluate the patients' food intake on their blood glucose level.;Meal timing in regards to the patients' current diabetes medication.;Meal options for control of blood glucose level and chronic complications.;Role of diet in the treatment of diabetes and the relationship between the three main macronutrients and blood glucose level    Physical activity and exercise  Role of exercise on diabetes management, blood pressure control and cardiac health.    Medications Reviewed patients medication for diabetes, action, purpose, timing of dose and side effects.    Monitoring Taught/discussed recording of test results and interpretation of SMBG.      Individualized Goals (developed by patient)   Nutrition General guidelines for healthy choices and portions discussed;Follow meal plan discussed    Physical Activity Exercise 3-5 times per week    Medications take my medication as prescribed    Monitoring  test my blood glucose as discussed    Reducing Risk examine blood glucose patterns;increase portions of healthy fats      Patient Self-Evaluation of Goals - Patient rates self as meeting previously set goals (% of time)   Nutrition >75%    Physical Activity >75%    Medications >75%     Monitoring >75%    Problem Solving >75%    Reducing Risk >75%    Health Coping >75%      Post-Education Assessment   Patient understands the diabetes disease and treatment process. Demonstrates understanding / competency    Patient understands incorporating nutritional management into lifestyle. Needs Review    Patient undertands incorporating physical activity into lifestyle. Demonstrates understanding / competency    Patient understands using medications safely. Demonstrates understanding / competency    Patient understands monitoring blood glucose, interpreting and using results Demonstrates understanding / competency    Patient understands prevention, detection, and treatment of acute complications.  Demonstrates understanding / competency    Patient understands prevention, detection, and treatment of chronic complications. Demonstrates understanding / competency    Patient understands how to develop strategies to address psychosocial issues. Demonstrates understanding / competency    Patient understands how to develop strategies to promote health/change behavior. Needs Review      Outcomes   Expected Outcomes Demonstrated interest in learning. Expect positive outcomes    Future DMSE 2 months    Program Status Not Completed             Individualized Plan for Diabetes Self-Management Training:   Learning Objective:  Patient will have a greater understanding of diabetes self-management. Patient education plan is to attend individual and/or group sessions per assessed needs and concerns.   Plan:   Patient Instructions  Do your V-go clicks 15 minutes before you eat.  Consider increasing your exercise to 5 days per week.  Salad or lower fat option most often for lunch  Move dinner up (generally before 6). Small snack at night if blood glucose is in the lower range.  Pay attention to nutrition quality.  Increase your vegetables.  Monitor your blood sugar before and after  your meals to see the effect of food.  A rise of 40-60 points is normal but after salad or if you walked after dinner it may not rise much.  Listen to your body.  When are you satisfied? Before snacking, ask, "Am I hungry or eating for another reason?"   Expected Outcomes:  Demonstrated interest in learning. Expect positive outcomes  Education material provided: Food label handouts, Meal plan card, My Plate, Snack sheet, and Diabetes Resources  If problems or questions, patient to contact team via:  Phone  Future DSME appointment: 2 months

## 2021-02-10 NOTE — Assessment & Plan Note (Signed)
Patient with roughly 5-6-day history of left posterior shoulder pain with radiation to the left fingertips, atraumatic in onset.  She does endorse increased physical activity (attending a homecoming event) at onset, has noted mild fumbling/clumsiness at onset which has since resolved, pain is persisted despite medications from urgent care.  Physical examination reveals limited left foot torsion and extension, positive left Spurling's, significant tenderness throughout the left rhomboid major and minor, levator scapula, and upper trapezius, secondary findings of the right upper trapezius and rhomboid minor regions.  Shoulder examination of the left is benign.  Given her stated history, physical examination and radiographic features, her symptoms are of cervical etiology with her radiculopathy in the C4-5, C5-6 distributions.  I have advised oral diclofenac, gabapentin, and cyclobenzaprine 2-week course, and low threshold to initiate 5-day prednisone burst if symptoms fail to improve by this weekend.  I did review prednisone and relation to blood sugars and that only if symptoms show no improvement, is she to start prednisone course.  We will maintain close follow-up for clinical reevaluation.  If suboptimal progress despite adherence to the above, inclusive of prednisone, local injections, formal physical therapy, and referral to pain and spine to all be considered.  If improved/improving, home-based versus formal PT to commence.

## 2021-02-10 NOTE — Patient Instructions (Addendum)
-   Dose diclofenac every a.m. and every p.m. scheduled with food until follow-up - Dose nightly cyclobenzaprine, side effect can be drowsiness - Cyclobenzaprine can additionally be dosed every 8 hours on an as-needed basis - Dose gabapentin 600 mg nightly - If symptoms fail to improve by this weekend, or anytime thereafter, start 5-day prednisone course - Gentle activity, rest the neck, okay to maintain gentle motion - Use moist heat, massage for additional pain control - Return for follow-up 02/21/2021

## 2021-02-10 NOTE — Patient Instructions (Addendum)
Do your V-go clicks 15 minutes before you eat.  Consider increasing your exercise to 5 days per week.  Salad or lower fat option most often for lunch  Move dinner up (generally before 6). Small snack at night if blood glucose is in the lower range.  Pay attention to nutrition quality.  Increase your vegetables.  Monitor your blood sugar before and after your meals to see the effect of food.  A rise of 40-60 points is normal but after salad or if you walked after dinner it may not rise much.  Listen to your body.  When are you satisfied? Before snacking, ask, "Am I hungry or eating for another reason?"

## 2021-02-10 NOTE — Progress Notes (Signed)
Primary Care / Sports Medicine Office Visit  Patient Information:  Patient ID: Melinda Ford, female DOB: 06/25/1945 Age: 75 y.o. MRN: [id number]   Melinda Ford is a pleasant 76 y.o. female presenting with the following:  Chief Complaint  Patient presents with   New Patient (Initial Visit)   Shoulder Pain    Left; since 02/06/21; radiating to fingertips; no known injury or trauma; 8/10 pain    Review of Systems pertinent details above   Patient Active Problem List   Diagnosis Date Noted   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/2021   Cervical paraspinal muscle spasm 02/10/2021   Gastroesophageal reflux disease 12/28/2020   Personal history of colonic polyps    Polyp of sigmoid colon    Abdominal pain, epigastric    Patient noncompliant with statin medication 09/12/2019   Type II diabetes mellitus with complication ([place]) 71/16/5790   Hyperlipidemia associated with type 2 diabetes mellitus ([place]) 05/17/2018   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 02/22/2018   Chronic midline thoracic back pain 02/22/2018   Tinea versicolor 09/20/2016   OSA (obstructive sleep apnea) 08/23/2016   Allergic rhinitis 07/13/2016   Overweight (BMI 25.0-29.9) 10/01/2015   Vitamin D deficiency 09/18/2014   Onychomycosis 09/16/2014   Degenerative disc disease, lumbar 09/16/2014   Benign essential HTN 10/10/2010   Osteoporosis 10/10/2010   Past Medical History:  Diagnosis Date   Allergy    Arthritis    Cataract    Diabetes mellitus without complication ([place])    Hyperlipidemia    Hypertension    Motion sickness    planes   OSA (obstructive sleep apnea) 08/23/2016   CPAP   Osteoporosis    Presence of dental prosthetic device    implants   Radiculopathy of lumbosacral region 06/22/2016   Right lateral epicondylitis 06/04/2017   Right sided sciatica 03/09/2016   Sleep apnea    Outpatient Encounter Medications as of 02/10/2021  Medication Sig   acetaminophen  (TYLENOL) 500 MG tablet Take 1,000 mg by mouth 2 (two) times daily. As needed   amLODipine (NORVASC) 10 MG tablet Take 1 tablet (10 mg total) by mouth daily.   benzonatate (TESSALON) 100 MG capsule Take 2 capsules (200 mg total) by mouth every 8 (eight) hours.   Calcium Carbonate-Vit D-Min (CALCIUM 1200 PO) Take 2 each by mouth daily.   Cholecalciferol (VITAMIN D) 2000 UNITS CAPS Take 1 capsule (2,000 Units total) by mouth daily.   Continuous Blood Gluc Receiver (FREESTYLE LIBRE 14 DAY READER) DEVI See admin instructions.   Continuous Blood Gluc Sensor (FREESTYLE LIBRE 2 SENSOR) MISC APPLY 1 SENSOR TO BODY EVERY 14 DAYS TO MONITOR BLOOD SUGAR   cyclobenzaprine (FLEXERIL) 5 MG tablet Take 1 tablet (5 mg total) by mouth at bedtime as needed for muscle spasms.   diclofenac (VOLTAREN) 50 MG EC tablet Take 1 tablet (50 mg total) by mouth 2 (two) times daily.   gabapentin (NEURONTIN) 600 MG tablet Take 1 tablet (600 mg total) by mouth at bedtime.   GINKGO BILOBA COMPLEX PO Take 1 capsule by mouth daily.   ibuprofen (ADVIL) 200 MG tablet Take 800 mg by mouth every 6 (six) hours as needed.   insulin aspart (NOVOLOG) 100 UNIT/ML injection USE A MAX OF 78 UNITS DAILY VIA INSULIN PUMP   Insulin Disposable Pump (V-GO 20) KIT 1  ONCE DAILY   ipratropium (ATROVENT) 0.06 % nasal spray Place 2 sprays into both nostrils 4 (four) times daily.  losartan (COZAAR) 100 MG tablet Take 1 tablet by mouth once daily   NON FORMULARY CPAP @@ 9 cm H2O nightly   omeprazole (PRILOSEC) 20 MG capsule Take 1 capsule (20 mg total) by mouth daily.   pravastatin (PRAVACHOL) 20 MG tablet Take 1 tablet by mouth once daily (Patient taking differently: Take 20 mg by mouth 3 (three) times a week.)   predniSONE (DELTASONE) 50 MG tablet Take 1 tablet (50 mg total) by mouth daily.   [DISCONTINUED] baclofen (LIORESAL) 10 MG tablet Take 1 tablet (10 mg total) by mouth 3 (three) times daily.   [DISCONTINUED] diclofenac Sodium (VOLTAREN) 1 %  GEL Apply 2 g topically 4 (four) times daily.   Semaglutide,0.25 or 0.5MG/DOS, (OZEMPIC, 0.25 OR 0.5 MG/DOSE,) 2 MG/1.5ML SOPN Inject 0.5 mg into the skin once a week. (Patient not taking: No sig reported)   [DISCONTINUED] Blood Glucose Monitoring Suppl (ONETOUCH VERIO) w/Device KIT 1 each by Does not apply route 4 (four) times daily.   [DISCONTINUED] glucose blood (ONETOUCH VERIO) test strip Use to test blood sugar 4 times daily   [DISCONTINUED] meloxicam (MOBIC) 15 MG tablet One tablet daily as needed for arthritis pain   [DISCONTINUED] ondansetron (ZOFRAN ODT) 8 MG disintegrating tablet Take 1 tablet (8 mg total) by mouth every 8 (eight) hours as needed for nausea or vomiting. (Patient not taking: Reported on 02/10/2021)   No facility-administered encounter medications on file as of 02/10/2021.   Past Surgical History:  Procedure Laterality Date   ABDOMINAL HYSTERECTOMY     CATARACT EXTRACTION Right 2020   COLONOSCOPY WITH PROPOFOL N/A 10/10/2019   Procedure: COLONOSCOPY WITH PROPOFOL;  Surgeon: Lucilla Lame, MD;  Location: [facility];  Service: Endoscopy;  Laterality: N/A;   ESOPHAGOGASTRODUODENOSCOPY (EGD) WITH PROPOFOL N/A 10/10/2019   Procedure: ESOPHAGOGASTRODUODENOSCOPY (EGD) WITH PROPOFOL;  Surgeon: Lucilla Lame, MD;  Location: [facility];  Service: Endoscopy;  Laterality: N/A;  Diabetic - insulin pump and oral meds sleep apnea   POLYPECTOMY  10/10/2019   Procedure: POLYPECTOMY;  Surgeon: Lucilla Lame, MD;  Location: [facility];  Service: Endoscopy;;    Vitals:   02/10/21 1421  BP: (!) 178/98  Pulse: 80  SpO2: 97%   Vitals:   02/10/21 1421  Weight: 163 lb (73.9 kg)  Height: _0  (1.575 m)   Body mass index is 29.81 kg/m.  No results found.   Independent interpretation of notes and tests performed by another provider:   Independent interpretation of cervical spine x-rays dated 02/10/2021 reveal significant malalignment with partial reversal of  the expected cervical lordosis, anterior alignment with curvature, multilevel degenerative changes most notably at C3-4, C4-5, C5-6 with associated foraminal narrowing, intervertebral space narrowing and loss, anterior endplate osteophytosis.  No acute osseous processes noted  Independent interpretation of left shoulder x-rays dated 02/10/2021 reveal no significant glenohumeral or acromioclavicular degenerative changes, alignment maintained.  Procedures performed:   None  Pertinent History, Exam, Impression, and Recommendations:   Cervical paraspinal muscle spasm See additional assessment(s) for plan details.  Cervical spondylosis with radiculopathy Patient with roughly 5-6-day history of left posterior shoulder pain with radiation to the left fingertips, atraumatic in onset.  She does endorse increased physical activity (attending a homecoming event) at onset, has noted mild fumbling/clumsiness at onset which has since resolved, pain is persisted despite medications from urgent care.  Physical examination reveals limited left foot torsion and extension, positive left Spurling's, significant tenderness throughout the left rhomboid major and minor, levator scapula, and upper trapezius,  secondary findings of the right upper trapezius and rhomboid minor regions.  Shoulder examination of the left is benign.  Given her stated history, physical examination and radiographic features, her symptoms are of cervical etiology with her radiculopathy in the C4-5, C5-6 distributions.  I have advised oral diclofenac, gabapentin, and cyclobenzaprine 2-week course, and low threshold to initiate 5-day prednisone burst if symptoms fail to improve by this weekend.  I did review prednisone and relation to blood sugars and that only if symptoms show no improvement, is she to start prednisone course.  We will maintain close follow-up for clinical reevaluation.  If suboptimal progress despite adherence to the above,  inclusive of prednisone, local injections, formal physical therapy, and referral to pain and spine to all be considered.  If improved/improving, home-based versus formal PT to commence.   Orders & Medications Meds ordered this encounter  Medications   diclofenac (VOLTAREN) 50 MG EC tablet    Sig: Take 1 tablet (50 mg total) by mouth 2 (two) times daily.    Dispense:  30 tablet    Refill:  0   cyclobenzaprine (FLEXERIL) 5 MG tablet    Sig: Take 1 tablet (5 mg total) by mouth at bedtime as needed for muscle spasms.    Dispense:  28 tablet    Refill:  0   gabapentin (NEURONTIN) 600 MG tablet    Sig: Take 1 tablet (600 mg total) by mouth at bedtime.    Dispense:  14 tablet    Refill:  0   predniSONE (DELTASONE) 50 MG tablet    Sig: Take 1 tablet (50 mg total) by mouth daily.    Dispense:  5 tablet    Refill:  0   Orders Placed This Encounter  Procedures   DG Shoulder Left   DG Cervical Spine Complete     Return in about 11 days (around 02/21/2021).     Montel Culver, MD   Primary Care Sports Medicine [place]

## 2021-02-10 NOTE — Assessment & Plan Note (Signed)
See additional assessment(s) for plan details. 

## 2021-02-11 ENCOUNTER — Other Ambulatory Visit: Payer: Self-pay | Admitting: Internal Medicine

## 2021-02-11 NOTE — Telephone Encounter (Signed)
Requested Prescriptions  Pending Prescriptions Disp Refills  . Continuous Blood Gluc Sensor (FREESTYLE LIBRE 2 SENSOR) MISC [Pharmacy Med Name: FREESTYLE LIBRE 2 SENSOR KIT] 2 each 0    Sig: APPLY 1 SENSOR TO BODY EVERY 14 DAYS TO MONITOR BLOOD SUGAR     Endocrinology: Diabetes - Testing Supplies Passed - 02/11/2021  9:31 AM      Passed - Valid encounter within last 12 months    Recent Outpatient Visits          Yesterday Cervical spondylosis with radiculopathy   [facility] Montel Culver, MD   4 days ago Back muscle spasm   [facility] Glean Hess, MD   1 month ago Annual physical exam   [facility] Glean Hess, MD   5 months ago COVID-19   [facility] Glean Hess, MD   6 months ago Bursitis of left hip, unspecified bursa   [facility] Glean Hess, MD      Future Appointments            In 1 week Zigmund Daniel, Earley Abide, MD [facility], [facility]

## 2021-02-14 ENCOUNTER — Telehealth: Payer: Self-pay

## 2021-02-14 NOTE — Telephone Encounter (Signed)
Please advise for exercises.  I can call patient to confirm any questions regarding medications recently prescribed as well.

## 2021-02-14 NOTE — Telephone Encounter (Signed)
She can perform gentle range of motion exercises at the neck and shoulder (to limit stiffness) but otherwise is to take relative rest (hold from upper body athletics, classes, etc) and focus on taking it easy while the medications take effect.  If improved / improving at her return, we will discuss next steps (exercises, PT, etc)

## 2021-02-14 NOTE — Telephone Encounter (Signed)
Patient notified and verbalized understanding.  Patient states she is on her third day of prednisone and is getting relief.  Melinda Ford will waiting for re-evaluation to discuss next steps and will refrain from group exercise classes.  For your information.

## 2021-02-14 NOTE — Telephone Encounter (Signed)
Copied from [place] [phone]. Topic: General - Other >> Feb 14, 2021  8:53 AM Holley Dexter N wrote: Reason for CRM: Pt called in stating she has a pinched nerve, and wanted to know what kind of exercises Dr. Rodman Key recommends for her arms and back, and if she can still go to her work out classes, she also asked for a call back about a medication please advise.

## 2021-02-16 ENCOUNTER — Other Ambulatory Visit: Payer: Self-pay | Admitting: Family Medicine

## 2021-02-16 DIAGNOSIS — M4722 Other spondylosis with radiculopathy, cervical region: Secondary | ICD-10-CM

## 2021-02-16 DIAGNOSIS — M62838 Other muscle spasm: Secondary | ICD-10-CM

## 2021-02-16 NOTE — Telephone Encounter (Signed)
Copied from [place] [id number]. Topic: Quick Communication - Rx Refill/Question >> Feb 16, 2021 11:58 AM Leward Quan A wrote: Medication: predniSONE (DELTASONE) 50 MG tablet Per patirnt going to a confrence and need medication by 02/21/21 Has the patient contacted their pharmacy? No. No refills (Agent: If no, request that the patient contact the pharmacy for the refill. If patient does not wish to contact the pharmacy document the reason why and proceed with request.) (Agent: If yes, when and what did the pharmacy advise?)  Preferred Pharmacy (with phone number or street name): [place], [place]  Phone:  [phone] Fax:  [phone]    Has the patient been seen for an appointment in the last year OR does the patient have an upcoming appointment? Yes.    Agent: Please be advised that RX refills may take up to 3 business days. We ask that you follow-up with your pharmacy.

## 2021-02-17 ENCOUNTER — Encounter: Payer: Self-pay | Admitting: Family Medicine

## 2021-02-17 ENCOUNTER — Other Ambulatory Visit: Payer: Self-pay

## 2021-02-17 ENCOUNTER — Ambulatory Visit (INDEPENDENT_AMBULATORY_CARE_PROVIDER_SITE_OTHER): Payer: HMO | Admitting: Family Medicine

## 2021-02-17 VITALS — BP 194/102 | HR 71 | Ht 62.0 in | Wt 161.0 lb

## 2021-02-17 DIAGNOSIS — M4722 Other spondylosis with radiculopathy, cervical region: Secondary | ICD-10-CM

## 2021-02-17 DIAGNOSIS — M62838 Other muscle spasm: Secondary | ICD-10-CM

## 2021-02-17 MED ORDER — HYDROCODONE-ACETAMINOPHEN 5-325 MG PO TABS: 1.0000 | ORAL_TABLET | Freq: Three times a day (TID) | ORAL | 0 refills | Status: DC | PRN

## 2021-02-17 MED ORDER — BACLOFEN 10 MG PO TABS: 10.0000 mg | ORAL_TABLET | Freq: Three times a day (TID) | ORAL | 0 refills | Status: DC

## 2021-02-17 MED ORDER — GABAPENTIN 600 MG PO TABS
600.0000 mg | ORAL_TABLET | Freq: Every day | ORAL | 0 refills | Status: DC
Start: 2021-02-17 — End: 2021-03-17

## 2021-02-17 MED ORDER — DICLOFENAC POTASSIUM 50 MG PO TABS: 50.0000 mg | ORAL_TABLET | Freq: Two times a day (BID) | ORAL | 0 refills | Status: DC

## 2021-02-17 NOTE — Patient Instructions (Addendum)
-   Dose diclofenac every a.m. and every p.m. scheduled with food - Stop cyclobenzaprine - Dose baclofen nightly scheduled, additional doses can be taken every 8 hours on an as-needed basis for muscle tightness pain - Dose nightly gabapentin - Utilize moist heat, massage, and Tylenol for additional pain control - For pain not responding to the above, can dose hydrocodone on an as-needed basis - Referral coordinator will contact you to coordinate follow-up with physical therapy and pain/spine group - Activity modifications as discussed (activity restrictions to include only performing activities of daily living such as eating, drinking, getting dressed). - Return for follow-up in 4 weeks

## 2021-02-17 NOTE — Progress Notes (Signed)
Primary Care / Sports Medicine Office Visit  Patient Information:  Patient ID: Melinda Ford, female DOB: [birth year] Age: 75 y.o. MRN: [id number]   Melinda Ford is a pleasant 75 y.o. female presenting with the following:  Chief Complaint  Patient presents with   Shoulder Pain    left    Patient Active Problem List   Diagnosis Date Noted   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/2021   Cervical paraspinal muscle spasm 02/10/2021   Gastroesophageal reflux disease 12/28/2020   Personal history of colonic polyps    Polyp of sigmoid colon    Abdominal pain, epigastric    Patient noncompliant with statin medication 09/12/2019   Type II diabetes mellitus with complication ([place]) 37/90/2409   Hyperlipidemia associated with type 2 diabetes mellitus ([place]) 05/17/2018   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  DG Cervical Spine Complete  Result Date: 02/12/2021 CLINICAL DATA:  Neck pain, left shoulder pain EXAM: CERVICAL SPINE - COMPLETE 4+ VIEW COMPARISON:  None. FINDINGS: No recent fracture is seen. Osteopenia is seen in bony structures. There is straightening of lordosis which may be due to positioning or muscle spasm. Degenerative changes are noted with disc space narrowing and bony spurs from C4-C7 levels. There is encroachment of neural foramina from C4-C7 levels, more so on the right side. Prevertebral  soft tissues are unremarkable. IMPRESSION: No recent fracture is seen in the cervical spine. Cervical spondylosis with significant encroachment of neural foramina from C4-C7 levels. Electronically Signed   By: Elmer Picker M.D.   On: 02/12/2021 12:27   DG Shoulder Left  Result Date: 02/12/2021 CLINICAL DATA:  Pain EXAM: LEFT SHOULDER - 2+ VIEW COMPARISON:  None. FINDINGS: There is no evidence of fracture or dislocation. There is no evidence of arthropathy or other focal bone abnormality. Soft tissues are unremarkable. IMPRESSION: No radiographic abnormality is seen in the left shoulder. Electronically Signed   By: Elmer Picker M.D.   On: 02/12/2021 12:25     Independent interpretation of notes and tests performed by another provider:   None  Procedures performed:   None  Pertinent History, Exam, Impression, and Recommendations:   Cervical spondylosis with radiculopathy Patient noted significant improvement in symptomatology at the posterior lateral left neck and left upper extremity radiating symptoms following prednisone course.  Unfortunately, she performed housework (sweeping, etc.) during this period of relative symptom improvement and has noted severe recurrence following completion of prednisone, she also discontinued other medications.  Given the patient's comorbid diabetes and hypertension, we reviewed treatment strategies given relative medical associated risks.  We will defer from restart of prednisone, place a referral to both physical therapy and pain and spine, restart diclofenac scheduled, transition from cyclobenzaprine to baclofen nightly with additional doses being as needed, and restart gabapentin 600 mg nightly.  Given the severity of her symptoms, I have advised additional supportive  care with heat, massage, strict activity restrictions, and hydrocodone for breakthrough severe pain.  She is to maintain close follow-up in 4 weeks time for clinical reevaluation,  if recalcitrant symptomatology despite adherence to the above, anticipate advanced imaging, further modification of pharmacotherapy.   I provided a total time of 42 minutes including both face-to-face and non-face-to-face time on 02/17/2021 inclusive of time utilized for medical chart review, information gathering, care coordination with staff, and documentation completion. Time spent specifically for review of clinical course with patient, her husband, re-review of imaging, discussion of updated plan given recalcitrant symptoms, need for referrals and coordination of referrals. Questions were answered at length and shared decision making took place.  Orders & Medications Meds ordered this encounter  Medications   HYDROcodone-acetaminophen (NORCO/VICODIN) 5-325 MG tablet    Sig: Take 1 tablet by mouth every 8 (eight) hours as needed for moderate pain.    Dispense:  15 tablet    Refill:  0   diclofenac (CATAFLAM) 50 MG tablet    Sig: Take 1 tablet (50 mg total) by mouth 2 (two) times daily.    Dispense:  60 tablet    Refill:  0   baclofen (LIORESAL) 10 MG tablet    Sig: Take 1 tablet (10 mg total) by mouth 3 (three) times daily.    Dispense:  30 each    Refill:  0   gabapentin (NEURONTIN) 600 MG tablet    Sig: Take 1 tablet (600 mg total) by mouth at bedtime.    Dispense:  30 tablet    Refill:  0   Orders Placed This Encounter  Procedures   Ambulatory referral to Pain Clinic   Ambulatory referral to Physical Therapy     Return in about 4 weeks (around 03/17/2021).     Montel Culver, MD   Primary Care Sports Medicine [address]

## 2021-02-17 NOTE — Assessment & Plan Note (Signed)
Patient noted significant improvement in symptomatology at the posterior lateral left neck and left upper extremity radiating symptoms following prednisone course.  Unfortunately, she performed housework (sweeping, etc.) during this period of relative symptom improvement and has noted severe recurrence following completion of prednisone, she also discontinued other medications.  Given the patient's comorbid diabetes and hypertension, we reviewed treatment strategies given relative medical associated risks.  We will defer from restart of prednisone, place a referral to both physical therapy and pain and spine, restart diclofenac scheduled, transition from cyclobenzaprine to baclofen nightly with additional doses being as needed, and restart gabapentin 600 mg nightly.  Given the severity of her symptoms, I have advised additional supportive care with heat, massage, strict activity restrictions, and hydrocodone for breakthrough severe pain.  She is to maintain close follow-up in 4 weeks time for clinical reevaluation, if recalcitrant symptomatology despite adherence to the above, anticipate advanced imaging, further modification of pharmacotherapy.

## 2021-02-17 NOTE — Telephone Encounter (Signed)
Requested medication (s) are due for refill today:   Yes  Requested medication (s) are on the active medication list:   Yes  Future visit scheduled:   Yes in 4 days   Last ordered: 02/10/2021 #5, 0 refills  Non delegated refill duplicate request from pharmacy   Requested Prescriptions  Pending Prescriptions Disp Refills   predniSONE (DELTASONE) 50 MG tablet 5 tablet 0    Sig: Take 1 tablet (50 mg total) by mouth daily.     Not Delegated - Endocrinology:  Oral Corticosteroids Failed - 02/16/2021 12:22 PM      Failed - This refill cannot be delegated      Failed - Last BP in normal range    BP Readings from Last 1 Encounters:  02/10/21 (!) 178/98          Passed - Valid encounter within last 6 months    Recent Outpatient Visits           1 week ago Cervical spondylosis with radiculopathy   [facility] Montel Culver, MD   1 week ago Back muscle spasm   [facility] Glean Hess, MD   1 month ago Annual physical exam   [facility] Glean Hess, MD   5 months ago COVID-19   [facility] Glean Hess, MD   6 months ago Bursitis of left hip, unspecified bursa   [facility] Glean Hess, MD       Future Appointments             In 4 days Zigmund Daniel Earley Abide, MD [facility], [facility]

## 2021-02-17 NOTE — Telephone Encounter (Signed)
Requested medication (s) are due for refill today:   Provider to review  Requested medication (s) are on the active medication list:   Yes  Future visit scheduled:   Yes   Last ordered: 02/10/2021 #5, 0 refills  Non delegated refill  Duplicate request sent from pharmacy   Requested Prescriptions  Pending Prescriptions Disp Refills   predniSONE ([place]) 50 MG tablet [Pharmacy Med Name: predniSONE 50 MG Oral Tablet] 5 tablet 0    Sig: Take 1 tablet by mouth once daily     Not Delegated - Endocrinology:  Oral Corticosteroids Failed - 02/16/2021  6:05 PM      Failed - This refill cannot be delegated      Failed - Last BP in normal range    BP Readings from Last 1 Encounters:  02/10/21 (!) 178/98          Passed - Valid encounter within last 6 months    Recent Outpatient Visits           1 week ago Cervical spondylosis with radiculopathy   [facility] Montel Culver, MD   1 week ago Back muscle spasm   [facility] Glean Hess, MD   1 month ago Annual physical exam   [facility] Glean Hess, MD   5 months ago COVID-19   [facility] Glean Hess, MD   6 months ago Bursitis of left hip, unspecified bursa   [facility] Glean Hess, MD       Future Appointments             In 4 days Zigmund Daniel, Earley Abide, MD [facility], [facility]

## 2021-02-17 NOTE — Telephone Encounter (Signed)
Pt wants Dr Zigmund Daniel to know she is in so much pain, she cannot stay like this until her appointment on Monday.  Pt states she is supposed to go out of town this weekend, but she will not be able if she continues like this.

## 2021-02-21 ENCOUNTER — Telehealth: Payer: Self-pay

## 2021-02-21 ENCOUNTER — Ambulatory Visit: Payer: HMO | Admitting: Family Medicine

## 2021-02-21 ENCOUNTER — Ambulatory Visit: Payer: HMO | Admitting: Internal Medicine

## 2021-02-21 NOTE — Telephone Encounter (Signed)
Copied from [place] [phone]. Topic: General - Other >> Feb 21, 2021 10:05 AM Leward Quan A wrote: Reason for CRM: Patient called in to inform Dr Zigmund Daniel that the pain clinic she was referred to cannot see her until 03/17/21 and would like to know if there is another Dr that she can be referred to that can see her sooner because she is hurting badly. Please call patient at  Ph# [phone] or [phone]

## 2021-02-21 NOTE — Telephone Encounter (Signed)
Spoke with patient around 10:40 AM and advised that most places are scheduling 3 months out and 03/17/21 would most likely be the soonest appointment available to her in her network.  Advised patient to call to be put on the wait list at [facility] and they will call her if they have a cancellation and patient states she already has done this.  Also advised patient can check with the clinic daily to see if there are any cancellations. Notified patient to continue current regimen Dr. Zigmund Daniel has in place for her.  Verbalized understanding.  For your information.

## 2021-02-22 ENCOUNTER — Other Ambulatory Visit: Payer: Self-pay

## 2021-02-22 ENCOUNTER — Ambulatory Visit: Payer: HMO | Attending: Family Medicine

## 2021-02-22 DIAGNOSIS — M6281 Muscle weakness (generalized): Secondary | ICD-10-CM | POA: Diagnosis not present

## 2021-02-22 DIAGNOSIS — M4722 Other spondylosis with radiculopathy, cervical region: Secondary | ICD-10-CM | POA: Insufficient documentation

## 2021-02-22 DIAGNOSIS — M62838 Other muscle spasm: Secondary | ICD-10-CM | POA: Diagnosis not present

## 2021-02-22 DIAGNOSIS — M5413 Radiculopathy, cervicothoracic region: Secondary | ICD-10-CM | POA: Diagnosis not present

## 2021-02-22 NOTE — Therapy (Signed)
[facility] [address] [place], [place], [address] Phone: [phone]   Fax:  [phone]  Physical Therapy Evaluation  Patient Details  Name: Melinda Ford MRN: [id number] Date of Birth: [birth year] Referring Provider (PT): Rosette Reveal MD  Encounter Date: 02/22/2021   PT End of Session - 02/22/21 1812     Visit Number 1    Number of Visits 17    Date for PT Re-Evaluation 04/19/21    PT Start Time 1436    PT Stop Time 1530    PT Time Calculation (min) 54 min    Activity Tolerance Patient limited by pain    Behavior During Therapy [facility] for tasks assessed/performed             Past Medical History:  Diagnosis Date   Allergy    Arthritis    Cataract    Diabetes mellitus without complication ([place])    Hyperlipidemia    Hypertension    Motion sickness    planes   OSA (obstructive sleep apnea) 08/23/2016   CPAP   Osteoporosis    Presence of dental prosthetic device    implants   Radiculopathy of lumbosacral region 06/22/2016   Right lateral epicondylitis 06/04/2017   Right sided sciatica 03/09/2016   Sleep apnea     Past Surgical History:  Procedure Laterality Date   ABDOMINAL HYSTERECTOMY     CATARACT EXTRACTION Right 2020   COLONOSCOPY WITH PROPOFOL N/A 10/10/2019   Procedure: COLONOSCOPY WITH PROPOFOL;  Surgeon: Lucilla Lame, MD;  Location: [place];  Service: Endoscopy;  Laterality: N/A;   ESOPHAGOGASTRODUODENOSCOPY (EGD) WITH PROPOFOL N/A 10/10/2019   Procedure: ESOPHAGOGASTRODUODENOSCOPY (EGD) WITH PROPOFOL;  Surgeon: Lucilla Lame, MD;  Location: [place];  Service: Endoscopy;  Laterality: N/A;  Diabetic - insulin pump and oral meds sleep apnea   POLYPECTOMY  10/10/2019   Procedure: POLYPECTOMY;  Surgeon: Lucilla Lame, MD;  Location: [place];  Service: Endoscopy;;    There were no vitals filed for this visit.    Subjective Assessment - 02/22/21 1439     Subjective Melinda Ford  Is a 75 y.o. female referred to PT for L shoulder pain and neck pain. Reports onset 2 weeks ago after [facility] homecoming where she reported shoulder blade pain. Reports current pain at 9/10 NPS. Best pain level recorded at <5/10 NPS with prednisone. Worst is 9-10 NPS. reports L sided posterior shoulder pain and down into the hands. Reports hand pain in ulnar distribution on L with N/T. Described pain as aching, denies electric pain or burning pain in LLE. Reports pain is worsened with typing or all hand activities, reaching backwards behind herself. Does report difficulty with gripping with L hand and fine motor tasks. Pt reports difficulty sleeping only able to rest in 1 hour increments. Constant pain 24/7 with prednisone being only alleviating factor. Pt's goal is to improve pain relief.    Currently in Pain? Yes    Pain Score 9     Pain Location Neck    Pain Onset 1 to 4 weeks ago    Pain Frequency Constant            OBJECTIVE  SENSATION: Grossly intact to light touch bilateral UE as determined by testing dermatomes C2-T2. Proprioception and hot/cold testing deferred on this date   MUSCULOSKELETAL: Tremor: None Bulk: Normal Tone: Normal  Posture  Slight forward head posture with rounded shoulders  Palpation TTP at L rhomboids and middle  trap, medial border of scapula   Strength R/L 5/3* Shoulder flexion (anterior deltoid/pec major/coracobrachialis, axillary n. (C5/6) and musculocutaneous n. (C5-7)) 5/3* Shoulder abduction (deltoid/supraspinatus, axillary/suprascapular n, C5) 5/3* Elbow flexion (biceps brachii, brachialis, brachioradialis, musculoskeletal n, C5/6) 5/3* Elbow extension (triceps, radial n, C7) 5/4* Wrist Extension (C6/7) 5/4* Wrist Flexion (C6/7) 5/3 Finger adduction (interossei, ulnar n, T1)   AROM R/L 50 Cervical Flexion 42* Cervical Extension 20/35 Cervical Lateral Flexion 45/80 Cervical Rotation *Indicates pain, overpressure performed unless otherwise  indicated Repeated Movements No centralization or peripheralization of symptoms with repeated cervical protraction and retraction.     Passive Accessory Intervertebral Motion (PAIVM) Concordant pain C5-T2 with hypomobility. Thoracic spine generally hypomobile throughout  SPECIAL TESTS Spurlings A (ipsilateral lateral flexion/axial compression): R: Not done L: Not done Spurlings B (ipsilateral lateral flexion/contralateral rotation/axial compression): R: Not done L: Not done Distraction Test: Negative  ULTT Median: R: Negative L: Positive ULTT Ulnar: R: Negative L: Positive ULTT Radial: R: Negative L: Positive   Generally limited in examination due to acuity of pain. Educated on benefits of ice modality for inflammation and pain modulation,  [facility] PT Assessment - 02/22/21 1810       Assessment   Medical Diagnosis Cervical spondylosis    Referring Provider (PT) Rosette Reveal MD    Onset Date/Surgical Date --   ~2 weeks ago   Hand Dominance Right    Prior Therapy Yes for R hip pain      Precautions   Precautions None      Balance Screen   Has the patient fallen in the past 6 months No             Objective measurements completed on examination: See above findings.      PT Education - 02/22/21 1811     Education Details POC, HEP, use of modalities for pain management    Person(s) Educated Patient;Spouse    Methods Explanation;Demonstration;Handout    Comprehension Verbalized understanding;Need further instruction              PT Short Term Goals - 02/22/21 1822       PT SHORT TERM GOAL #1   Title Pt will be independent with HEP to improve cervical AROM and strength to return to pain free completion of ADL's    Baseline 11/22: initiated    Time 4    Period Weeks    Status New    Target Date 03/22/21               PT Long Term Goals - 02/22/21 1823       PT LONG TERM GOAL #1   Title Pt will improve FOTO score to target to display improvement  in functional mobility and ADL completion    Baseline 11/22: next visit    Time 8    Period Weeks    Status New    Target Date 04/19/21      PT LONG TERM GOAL #2   Title Pt will improve L grip strength to 75% of dominant hand (RUE) to return to ability to perform needed ADL's requring use of L hand    Baseline 11/22: R: 55.4 lbs, L: 27.9 lbs    Time 8    Period Weeks    Status New    Target Date 04/19/21      PT LONG TERM GOAL #3   Title Pt will report < 4/10 NPS in radicular pain with LUE use to display improvement in symptoms  with ADL's    Baseline 11/22: 9-10/10 pain with use of L hand for all tasks    Time 8    Period Weeks    Status New    Target Date 04/19/21      PT LONG TERM GOAL #4   Title Pt will display full, pain free cervical AROM to indicate improvements in cervical related pain    Baseline 11/22: flex: 60, extension: 42*, R/L lat flex: 20*/35*, R/L rotation: 45*/80*    Time 8    Period Weeks    Status New    Target Date 04/19/21                    Plan - 02/22/21 1813     Clinical Impression Statement Pt is a pleasant 75 y.o. female referred to PT due to L shoulder and UE radicular pain that occurred spontaneously ~ 2 weeks ago. Pt in significant acute pain so overall limited in assessment today. Pt presents with full sensation in UE and LE dermatomes with marked decrease in LUE strength overall due to pain with significant grip weakness and finger add/abd when comapred to R side along with cervical AROM deficits. [place] screen displays WNL B shoulder mobility with no onset of symptoms. Deferred on Spurlings due to intensity of pain and no pain relief with manual cervical distraction but does have worsening symptoms with narrowing of facet joints with cervical extension. (+) ULTT in median, ulnar and radial nervs on LUE with all (-) on RUE. Pt does endorse balance difficulty since onset of symptoms but does attribute some of the imbalance from pain meds.  No balance tests performed today due to time constraints. No concordant symptoms with CPA's C2-C5 but concordant pain from C7-T2 but unable to report if pain radiates to UE's. These deficits are significanlty limiting pt's ability to sleep and perform any fine motor ADL's due to her pain.    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/Moderate complexity    Clinical Decision Making Moderate    Rehab Potential Fair    PT Frequency 2x / week    PT Duration 8 weeks    PT Treatment/Interventions ADLs/Self Care Home Management;Aquatic Therapy;Cryotherapy;Electrical Stimulation;Moist Heat;Traction;Canalith Repostioning;DME Instruction;Gait training;Functional mobility training;Therapeutic activities;Therapeutic exercise;Balance training;Neuromuscular re-education;Stair training;Patient/family education;Manual techniques;Dry needling;Vestibular;Passive range of motion;Spinal Manipulations;Joint Manipulations    PT Next Visit Plan Balance screen, reassess HEP, please perform cervical FOTO ( was set on lumbar)    PT Home Exercise Plan Access Code [email or web address]    Consulted and Agree with Plan of Care Patient;Family member/caregiver    Family Member Consulted Husband             Patient will benefit from skilled therapeutic intervention in order to improve the following deficits and impairments:  Pain, Decreased mobility, Postural dysfunction, Decreased range of motion, Decreased strength, Hypomobility, Decreased balance  Visit Diagnosis: Muscle weakness (generalized)  Radiculopathy, cervicothoracic region     Problem List Patient Active Problem List   Diagnosis Date Noted   Cervical  spondylosis with radiculopathy 02/10/2021   Cervical paraspinal muscle spasm 02/10/2021   Gastroesophageal reflux disease 12/28/2020   Personal history of colonic polyps    Polyp of sigmoid colon    Abdominal pain, epigastric    Patient noncompliant with statin medication 09/12/2019   Type II diabetes mellitus with complication (  [place]) 06/04/2019   Hyperlipidemia associated with type 2 diabetes mellitus ([place]) 05/17/2018   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 02/22/2018   Chronic midline thoracic back pain 02/22/2018   Tinea versicolor 09/20/2016   OSA (obstructive sleep apnea) 08/23/2016   Allergic rhinitis 07/13/2016   Overweight (BMI 25.0-29.9) 10/01/2015   Vitamin D deficiency 09/18/2014   Onychomycosis 09/16/2014   Degenerative disc disease, lumbar 09/16/2014   Benign essential HTN 10/10/2010   Osteoporosis 10/10/2010    Salem Caster. Fairly IV, PT, DPT Physical Therapist- [facility]  02/22/2021, 6:36 PM  [facility] [address] [place], [place], [address] Phone: [phone]   Fax:  [phone]  Name: Melinda Ford MRN: [id number] Date of Birth: 1945/10/27

## 2021-03-02 ENCOUNTER — Encounter: Payer: Self-pay | Admitting: Physical Therapy

## 2021-03-02 ENCOUNTER — Ambulatory Visit: Payer: HMO | Admitting: Physical Therapy

## 2021-03-02 ENCOUNTER — Other Ambulatory Visit: Payer: Self-pay

## 2021-03-02 DIAGNOSIS — M5413 Radiculopathy, cervicothoracic region: Secondary | ICD-10-CM

## 2021-03-02 DIAGNOSIS — M6281 Muscle weakness (generalized): Secondary | ICD-10-CM

## 2021-03-02 NOTE — Therapy (Signed)
[facility] [address] [place], [place], [address] Phone: [phone]   Fax:  [phone]  Physical Therapy Treatment  Patient Details  Name: Melinda Ford MRN: [id number] Date of Birth: 1945-10-23 Referring Provider (PT): Rosette Reveal MD   Encounter Date: 03/02/2021   PT End of Session - 03/02/21 1430     Visit Number 2    Number of Visits 17    Date for PT Re-Evaluation 04/19/21    Authorization - Visit Number 2    Authorization - Number of Visits 10    PT Start Time 1430    PT Stop Time 1518    PT Time Calculation (min) 48 min    Activity Tolerance Patient limited by pain    Behavior During Therapy [facility] for tasks assessed/performed             Past Medical History:  Diagnosis Date   Allergy    Arthritis    Cataract    Diabetes mellitus without complication ([place])    Hyperlipidemia    Hypertension    Motion sickness    planes   OSA (obstructive sleep apnea) 08/23/2016   CPAP   Osteoporosis    Presence of dental prosthetic device    implants   Radiculopathy of lumbosacral region 06/22/2016   Right lateral epicondylitis 06/04/2017   Right sided sciatica 03/09/2016   Sleep apnea     Past Surgical History:  Procedure Laterality Date   ABDOMINAL HYSTERECTOMY     CATARACT EXTRACTION Right 2020   COLONOSCOPY WITH PROPOFOL N/A 10/10/2019   Procedure: COLONOSCOPY WITH PROPOFOL;  Surgeon: Lucilla Lame, MD;  Location: [place];  Service: Endoscopy;  Laterality: N/A;   ESOPHAGOGASTRODUODENOSCOPY (EGD) WITH PROPOFOL N/A 10/10/2019   Procedure: ESOPHAGOGASTRODUODENOSCOPY (EGD) WITH PROPOFOL;  Surgeon: Lucilla Lame, MD;  Location: [place];  Service: Endoscopy;  Laterality: N/A;  Diabetic - insulin pump and oral meds sleep apnea   POLYPECTOMY  10/10/2019   Procedure: POLYPECTOMY;  Surgeon: Lucilla Lame, MD;  Location: [place];  Service: Endoscopy;;    There were no vitals filed for this  visit.   Subjective Assessment - 03/02/21 1741     Subjective Pt. states she slept better last night but L shoulder pain/ UE symptoms continue to be pain limited.  PT discussed benefits of proper pillow/ sleeping position.  Pt. states that the only thing helping pain is hydocodone.  Pt. has been doing HEP.  Pt. reports marked limitations with L hand grasp.    Patient Stated Goals Decrease neck/ L UE pain symptoms.    Currently in Pain? Yes    Pain Score 8     Pain Location Neck    Pain Orientation Upper    Pain Onset 1 to 4 weeks ago             Manual tx.:  Supine cervical traction 4x with static holds as tolerated.  Supine L/R UT and levator manual stretches to pain tolerable range.  Slight increase in L UE symptoms with L rotn/ R UT stretches.    Supine chin tucks (moderate cuing)/ shoulder flexion and horizontal abduction 20x.   Pec stretches 3x.    STM to L/R UT and posterior deltoid in supine position.  No prone cervical/ thoracic mobs.   There.ex.:  Reviewed HEP   Pt. Educated on cervical anatomy/ symptoms     PT Short Term Goals - 02/22/21 3893  PT SHORT TERM GOAL #1   Title Pt will be independent with HEP to improve cervical AROM and strength to return to pain free completion of ADL's    Baseline 11/22: initiated    Time 4    Period Weeks    Status New    Target Date 03/22/21               PT Long Term Goals - 02/22/21 1823       PT LONG TERM GOAL #1   Title Pt will improve FOTO score to target to display improvement in functional mobility and ADL completion    Baseline 11/22: next visit    Time 8    Period Weeks    Status New    Target Date 04/19/21      PT LONG TERM GOAL #2   Title Pt will improve L grip strength to 75% of dominant hand (RUE) to return to ability to perform needed ADL's requring use of L hand    Baseline 11/22: R: 55.4 lbs, L: 27.9 lbs    Time 8    Period Weeks    Status New    Target Date 04/19/21      PT LONG  TERM GOAL #3   Title Pt will report < 4/10 NPS in radicular pain with LUE use to display improvement in symptoms with ADL's    Baseline 11/22: 9-10/10 pain with use of L hand for all tasks    Time 8    Period Weeks    Status New    Target Date 04/19/21      PT LONG TERM GOAL #4   Title Pt will display full, pain free cervical AROM to indicate improvements in cervical related pain    Baseline 11/22: flex: 60, extension: 42*, R/L lat flex: 20*/35*, R/L rotation: 45*/80*    Time 8    Period Weeks    Status New    Target Date 04/19/21                   Plan - 03/02/21 1743     Clinical Impression Statement Marked increase in L UE pain symptoms with L lateral flexion/ rotn. during cervical AROM.  Pt. requires mod. cuing/ demonstration for proper cervical retraction/ review of HEP.  No increase in pain/ radicular symptoms during manual traction in supine.  Pt. educated on proper sleeping posture/ use of memory foam and supportive pillow.  Pt. educated on neck imaging/ symptoms and radicular symptoms to determine appropriate POC.  L dorsal wrist pain during radial nerve glides.  No increase in symptoms with medial/ ulnar nerve glides.    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/Moderate complexity    Clinical Decision Making Moderate    Rehab Potential Fair    PT Frequency 2x / week    PT Duration 8 weeks    PT Treatment/Interventions ADLs/Self Care Home Management;Aquatic Therapy;Cryotherapy;Electrical Stimulation;Moist Heat;Traction;Canalith Repostioning;DME Instruction;Gait training;Functional mobility training;Therapeutic activities;Therapeutic exercise;Balance  training;Neuromuscular re-education;Stair training;Patient/family education;Manual techniques;Dry needling;Vestibular;Passive range of motion;Spinal Manipulations;Joint Manipulations    PT Next Visit Plan Balance screen, reassess HEP, please perform cervical FOTO ( was set on lumbar)    PT Home Exercise Plan Access Code [email or web address]    Consulted and Agree with Plan of Care Patient;Family member/caregiver    Family Member Consulted Husband  Patient will benefit from skilled therapeutic intervention in order to improve the following deficits and impairments:  Pain, Decreased mobility, Postural dysfunction, Decreased range of motion, Decreased strength, Hypomobility, Decreased balance  Visit Diagnosis: Muscle weakness (generalized)  Radiculopathy, cervicothoracic region     Problem List Patient Active Problem List   Diagnosis Date Noted   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/2021   Cervical paraspinal muscle spasm 02/10/2021   Gastroesophageal reflux disease 12/28/2020   Personal history of colonic polyps    Polyp of sigmoid colon    Abdominal pain, epigastric    Patient noncompliant with statin medication 09/12/2019   Type II diabetes mellitus with complication ([place]) 78/41/2820   Hyperlipidemia associated with type 2 diabetes mellitus ([place]) 05/17/2018   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 02/22/2018   Chronic midline thoracic back pain 02/22/2018   Tinea versicolor 09/20/2016   OSA (obstructive sleep apnea) 08/23/2016   Allergic rhinitis 07/13/2016   Overweight (BMI 25.0-29.9) 10/01/2015   Vitamin D deficiency 09/18/2014   Onychomycosis 09/16/2014   Degenerative disc disease, lumbar 09/16/2014   Benign essential HTN 10/10/2010   Osteoporosis 10/10/2010   Pura Spice, PT, DPT # [id number] 03/02/2021, 5:49 PM  [facility] [address] [place], [place], [address] Phone: [phone]   Fax:   [phone]  Name: Melinda Ford MRN: [id number] Date of Birth: 03/25/1946

## 2021-03-05 ENCOUNTER — Other Ambulatory Visit: Payer: Self-pay | Admitting: Internal Medicine

## 2021-03-05 DIAGNOSIS — I10 Essential (primary) hypertension: Secondary | ICD-10-CM

## 2021-03-06 ENCOUNTER — Other Ambulatory Visit: Payer: Self-pay | Admitting: Family Medicine

## 2021-03-06 DIAGNOSIS — M4722 Other spondylosis with radiculopathy, cervical region: Secondary | ICD-10-CM

## 2021-03-06 NOTE — Telephone Encounter (Signed)
Requested Prescriptions  Pending Prescriptions Disp Refills  . losartan (COZAAR) 100 MG tablet [Pharmacy Med Name: Losartan Potassium 100 MG Oral Tablet] 90 tablet 0    Sig: Take 1 tablet by mouth once daily     Cardiovascular:  Angiotensin Receptor Blockers Failed - 03/05/2021  3:31 PM      Failed - Last BP in normal range    BP Readings from Last 1 Encounters:  02/17/21 (!) 194/102         Passed - Cr in normal range and within 180 days    Creatinine  Date Value Ref Range Status  11/22/2012 0.78 0.60 - 1.30 mg/dL Final   Creatinine, Ser  Date Value Ref Range Status  10/01/2020 0.85 0.40 - 1.20 mg/dL Final   Creatinine,U  Date Value Ref Range Status  10/01/2020 123.4 mg/dL Final         Passed - K in normal range and within 180 days    Potassium  Date Value Ref Range Status  10/01/2020 4.4 3.5 - 5.1 mEq/L Final  10/04/2012 4.1 3.5 - 5.1 mmol/L Final         Passed - Patient is not pregnant      Passed - Valid encounter within last 6 months    Recent Outpatient Visits          2 weeks ago Cervical spondylosis with radiculopathy   [facility] Montel Culver, MD   3 weeks ago Cervical spondylosis with radiculopathy   [facility] Montel Culver, MD   3 weeks ago Back muscle spasm   [facility] Glean Hess, MD   2 months ago Annual physical exam   [facility] Glean Hess, MD   6 months ago [facility] Glean Hess, MD      Future Appointments            In 1 week Zigmund Daniel Earley Abide, MD [facility], [facility]

## 2021-03-07 ENCOUNTER — Encounter: Payer: Self-pay | Admitting: Physical Therapy

## 2021-03-07 ENCOUNTER — Other Ambulatory Visit: Payer: Self-pay

## 2021-03-07 ENCOUNTER — Ambulatory Visit: Payer: HMO | Attending: Family Medicine | Admitting: Physical Therapy

## 2021-03-07 ENCOUNTER — Encounter: Payer: HMO | Admitting: Physical Therapy

## 2021-03-07 DIAGNOSIS — M6281 Muscle weakness (generalized): Secondary | ICD-10-CM | POA: Diagnosis not present

## 2021-03-07 DIAGNOSIS — M5413 Radiculopathy, cervicothoracic region: Secondary | ICD-10-CM | POA: Insufficient documentation

## 2021-03-07 MED ORDER — HYDROCODONE-ACETAMINOPHEN 5-325 MG PO TABS: 1.0000 | ORAL_TABLET | Freq: Every evening | ORAL | 0 refills | Status: DC | PRN

## 2021-03-07 NOTE — Telephone Encounter (Signed)
Please review.  KP

## 2021-03-07 NOTE — Therapy (Signed)
[facility] [address] [place], [place], [address] Phone: [phone]   Fax:  [phone]  Physical Therapy Treatment  Patient Details  Name: Melinda Ford MRN: [id number] Date of Birth: 23-Jun-1945 Referring Provider (PT): Rosette Reveal MD   Encounter Date: 03/07/2021   Treatment: 3 of 17.  Recert date: 1/32/4401 1342 to 1430   Past Medical History:  Diagnosis Date   Allergy    Arthritis    Cataract    Diabetes mellitus without complication ([facility])    Hyperlipidemia    Hypertension    Motion sickness    planes   OSA (obstructive sleep apnea) 08/23/2016   CPAP   Osteoporosis    Presence of dental prosthetic device    implants   Radiculopathy of lumbosacral region 06/22/2016   Right lateral epicondylitis 06/04/2017   Right sided sciatica 03/09/2016   Sleep apnea     Past Surgical History:  Procedure Laterality Date   ABDOMINAL HYSTERECTOMY     CATARACT EXTRACTION Right 2020   COLONOSCOPY WITH PROPOFOL N/A 10/10/2019   Procedure: COLONOSCOPY WITH PROPOFOL;  Surgeon: Lucilla Lame, MD;  Location: [place];  Service: Endoscopy;  Laterality: N/A;   ESOPHAGOGASTRODUODENOSCOPY (EGD) WITH PROPOFOL N/A 10/10/2019   Procedure: ESOPHAGOGASTRODUODENOSCOPY (EGD) WITH PROPOFOL;  Surgeon: Lucilla Lame, MD;  Location: [place];  Service: Endoscopy;  Laterality: N/A;  Diabetic - insulin pump and oral meds sleep apnea   POLYPECTOMY  10/10/2019   Procedure: POLYPECTOMY;  Surgeon: Lucilla Lame, MD;  Location: [place];  Service: Endoscopy;;    There were no vitals filed for this visit.    Pt. continues to report L forearm/ wrist/ shoulder pain. Pt. states she has difficulty with chin tucks and sleeping posture.       Manual tx.:   Supine cervical traction 5x with static holds as tolerated.   Supine L/R UT and levator manual stretches to pain tolerable range.  Slight increase in L UE symptoms with L  rotn/ R UT stretches.     Supine pec stretches 3x.     STM to L/R UT and posterior deltoid in supine position.   Prone grade II-III PA unilateral/ central mobs to upper thoracic/low cervical vertebrae 2x30 sec. Each.  No change in radicular symptoms.   Supine L ULTT (radial/ ulnar/ median) 2x30 sec. Each.    There.ex.:  Seated B shoulder flexion/ scapular retraction (good symmetry)   Reviewed HEP.  Supine/ seated chin tucks       PT Short Term Goals - 02/22/21 1822       PT SHORT TERM GOAL #1   Title Pt will be independent with HEP to improve cervical AROM and strength to return to pain free completion of ADL's    Baseline 11/22: initiated    Time 4    Period Weeks    Status New    Target Date 03/22/21               PT Long Term Goals - 02/22/21 1823       PT LONG TERM GOAL #1   Title Pt will improve FOTO score to target to display improvement in functional mobility and ADL completion    Baseline 11/22: next visit    Time 8    Period Weeks    Status New    Target Date 04/19/21      PT LONG TERM GOAL #2   Title Pt will improve L grip  strength to 75% of dominant hand (RUE) to return to ability to perform needed ADL's requring use of L hand    Baseline 11/22: R: 55.4 lbs, L: 27.9 lbs    Time 8    Period Weeks    Status New    Target Date 04/19/21      PT LONG TERM GOAL #3   Title Pt will report < 4/10 NPS in radicular pain with LUE use to display improvement in symptoms with ADL's    Baseline 11/22: 9-10/10 pain with use of L hand for all tasks    Time 8    Period Weeks    Status New    Target Date 04/19/21      PT LONG TERM GOAL #4   Title Pt will display full, pain free cervical AROM to indicate improvements in cervical related pain    Baseline 11/22: flex: 60, extension: 42*, R/L lat flex: 20*/35*, R/L rotation: 45*/80*    Time 8    Period Weeks    Status New    Target Date 04/19/21              Good cervical AROM in seated posture with  increase discomfort at end-range L lateral/rotn. PT reviewed chin tucks/ HEP to ensure proper technique/ upright posture. Unable to reproduce pain symptoms with L UE neural glides/ ULTT. Good B UE shoulder flexion/ scap. retraction symmetry in seated posture. Pt. instructed on proper pillow/ neck support to improve sleep.       Patient will benefit from skilled therapeutic intervention in order to improve the following deficits and impairments:  Pain, Decreased mobility, Postural dysfunction, Decreased range of motion, Decreased strength, Hypomobility, Decreased balance  Visit Diagnosis: Muscle weakness (generalized)  Radiculopathy, cervicothoracic region     Problem List Patient Active Problem List   Diagnosis Date Noted   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/2021   Cervical paraspinal muscle spasm 02/10/2021   Gastroesophageal reflux disease 12/28/2020   Personal history of colonic polyps    Polyp of sigmoid colon    Abdominal pain, epigastric    Patient noncompliant with statin medication 09/12/2019   Type II diabetes mellitus with complication ([place]) 25/36/6440   Hyperlipidemia associated with type 2 diabetes mellitus ([place]) 05/17/2018   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 02/22/2018   Chronic midline thoracic back pain 02/22/2018   Tinea versicolor 09/20/2016   OSA (obstructive sleep apnea) 08/23/2016   Allergic rhinitis 07/13/2016   Overweight (BMI 25.0-29.9) 10/01/2015   Vitamin D deficiency 09/18/2014   Onychomycosis 09/16/2014   Degenerative disc disease, lumbar 09/16/2014   Benign essential HTN 10/10/2010   Osteoporosis 10/10/2010   Pura Spice, PT, DPT # [id number] 03/11/2021, 11:10 AM  [facility] [address] [place], [place], [address] Phone: [phone]   Fax:  [phone]  Name: JEANEE FABRE MRN: [id number] Date of Birth: 09-04-1945

## 2021-03-09 ENCOUNTER — Other Ambulatory Visit: Payer: Self-pay

## 2021-03-09 ENCOUNTER — Ambulatory Visit: Payer: HMO | Admitting: Physical Therapy

## 2021-03-09 DIAGNOSIS — M6281 Muscle weakness (generalized): Secondary | ICD-10-CM | POA: Diagnosis not present

## 2021-03-09 DIAGNOSIS — M5413 Radiculopathy, cervicothoracic region: Secondary | ICD-10-CM

## 2021-03-12 ENCOUNTER — Other Ambulatory Visit: Payer: Self-pay | Admitting: Internal Medicine

## 2021-03-12 ENCOUNTER — Other Ambulatory Visit: Payer: Self-pay | Admitting: Family Medicine

## 2021-03-12 DIAGNOSIS — M62838 Other muscle spasm: Secondary | ICD-10-CM

## 2021-03-12 DIAGNOSIS — M4722 Other spondylosis with radiculopathy, cervical region: Secondary | ICD-10-CM

## 2021-03-12 NOTE — Telephone Encounter (Signed)
Requested medication (s) are due for refill today: yes  Requested medication (s) are on the active medication list: yes  Last refill:  02/17/21 #30  Future visit scheduled: yes  Notes to clinic:  med not delegated to NT to RF   Requested Prescriptions  Pending Prescriptions Disp Refills   baclofen (LIORESAL) 10 MG tablet [Pharmacy Med Name: Baclofen 10 MG Oral Tablet] 30 tablet 0    Sig: TAKE 1 TABLET BY MOUTH THREE TIMES DAILY     Not Delegated - Analgesics:  Muscle Relaxants Failed - 03/12/2021 12:35 PM      Failed - This refill cannot be delegated      Passed - Valid encounter within last 6 months    Recent Outpatient Visits           3 weeks ago Cervical spondylosis with radiculopathy   [facility] Montel Culver, MD   1 month ago Cervical spondylosis with radiculopathy   [facility] Montel Culver, MD   1 month ago Back muscle spasm   [facility] Glean Hess, MD   2 months ago Annual physical exam   [facility] Glean Hess, MD   6 months ago COVID-19   [facility] Glean Hess, MD       Future Appointments             In 5 days Zigmund Daniel, Earley Abide, MD [facility], [facility]

## 2021-03-12 NOTE — Telephone Encounter (Signed)
Requested medication (s) are due for refill today: yes  Requested medication (s) are on the active medication list: yes  Last refill:  02/11/21  Future visit scheduled: yes  Notes to clinic:  prior notes to pharmacy: "must attend upcoming appt to prior to further refills"   Requested Prescriptions  Pending Prescriptions Disp Refills   Continuous Blood Gluc Sensor ([place] 2 SENSOR) [place] [Pharmacy Med Name: FREESTYLE LIBRE 2 SENSOR KIT] 2 each 0    Sig: APPLY 1 SENSOR TO BODY EVERY 14 DAYS TO [place]     Endocrinology: Diabetes - Testing Supplies Passed - 03/12/2021  2:57 PM      Passed - Valid encounter within last 12 months    Recent Outpatient Visits           3 weeks ago Cervical spondylosis with radiculopathy   [facility] Montel Culver, MD   1 month ago Cervical spondylosis with radiculopathy   [facility] Montel Culver, MD   1 month ago Back muscle spasm   [facility] Glean Hess, MD   2 months ago Annual physical exam   [facility] Glean Hess, MD   6 months ago [facility] Glean Hess, MD       Future Appointments             In 5 days Zigmund Daniel, Earley Abide, MD [facility], [facility], [facility]

## 2021-03-13 ENCOUNTER — Encounter: Payer: Self-pay | Admitting: Family Medicine

## 2021-03-14 ENCOUNTER — Other Ambulatory Visit: Payer: Self-pay

## 2021-03-14 ENCOUNTER — Ambulatory Visit: Payer: HMO | Admitting: Physical Therapy

## 2021-03-14 ENCOUNTER — Encounter: Payer: HMO | Admitting: Physical Therapy

## 2021-03-14 DIAGNOSIS — M6281 Muscle weakness (generalized): Secondary | ICD-10-CM | POA: Diagnosis not present

## 2021-03-14 DIAGNOSIS — M5413 Radiculopathy, cervicothoracic region: Secondary | ICD-10-CM

## 2021-03-14 NOTE — Telephone Encounter (Signed)
Pended for Dr. Zigmund Daniel to review upon his return.

## 2021-03-14 NOTE — Telephone Encounter (Signed)
Pt called back and wants to know the status of her refill request, please advise.

## 2021-03-14 NOTE — Telephone Encounter (Signed)
Refill if appropriate.  Please advise. See MyChart message.  Follow-up appointment scheduled 03/17/21.

## 2021-03-15 NOTE — Telephone Encounter (Signed)
Prescription sent to the pharmacy electronically. Unable to make appointment extended to 40 minutes.

## 2021-03-16 NOTE — Therapy (Signed)
[facility] [address] [place], [place], [address] Phone: [phone]   Fax:  [phone]  Physical Therapy Treatment  Patient Details  Name: Melinda Ford MRN: [id number] Date of Birth: 19-Jun-1945 Referring Provider (PT): Rosette Reveal MD   Encounter Date: 03/14/2021   PT End of Session - 03/16/21 1304     Visit Number 5    Number of Visits 17    Date for PT Re-Evaluation 04/19/21    Authorization - Visit Number 5    Authorization - Number of Visits 10    PT Start Time 3267    PT Stop Time 1516    PT Time Calculation (min) 48 min    Activity Tolerance Patient limited by pain    Behavior During Therapy [facility] for tasks assessed/performed             Past Medical History:  Diagnosis Date   Allergy    Arthritis    Cataract    Diabetes mellitus without complication ([place])    Hyperlipidemia    Hypertension    Motion sickness    planes   OSA (obstructive sleep apnea) 08/23/2016   CPAP   Osteoporosis    Presence of dental prosthetic device    implants   Radiculopathy of lumbosacral region 06/22/2016   Right lateral epicondylitis 06/04/2017   Right sided sciatica 03/09/2016   Sleep apnea     Past Surgical History:  Procedure Laterality Date   ABDOMINAL HYSTERECTOMY     CATARACT EXTRACTION Right 2020   COLONOSCOPY WITH PROPOFOL N/A 10/10/2019   Procedure: COLONOSCOPY WITH PROPOFOL;  Surgeon: Lucilla Lame, MD;  Location: [place];  Service: Endoscopy;  Laterality: N/A;   ESOPHAGOGASTRODUODENOSCOPY (EGD) WITH PROPOFOL N/A 10/10/2019   Procedure: ESOPHAGOGASTRODUODENOSCOPY (EGD) WITH PROPOFOL;  Surgeon: Lucilla Lame, MD;  Location: [place];  Service: Endoscopy;  Laterality: N/A;  Diabetic - insulin pump and oral meds sleep apnea   POLYPECTOMY  10/10/2019   Procedure: POLYPECTOMY;  Surgeon: Lucilla Lame, MD;  Location: [place];  Service: Endoscopy;;    There were no vitals filed for this  visit.   Subjective Assessment - 03/16/21 1301     Subjective Pt. continues to report persistent L neck/ shoulder/ forearm pain, esp. at night and with increase activity.  Pt. c/o weakness in L UE/ forearm with grasping/ household tasks.  Pt. states she did not take any medications this morning.    Patient Stated Goals Decrease neck/ L UE pain symptoms.    Currently in Pain? Yes    Pain Score 6     Pain Location Neck    Pain Orientation Left    Pain Onset More than a month ago             Therex.:  B UBE 3 min. F/b  Reviewed UE ex./ resisted there.ex.   Manual tx.:   See cervical AROM/ strength testing.    Supine cervical traction 5x with static holds as tolerated.   Supine L/R UT and levator manual stretches to pain tolerable range.  \3x each with static holds.    Supine pec stretches 3x.     STM to L/R UT and posterior deltoid in supine position.    Supine L ULTT (radial/ ulnar/ median) 2x30 sec. Each.          PT Short Term Goals - 02/22/21 1822       PT SHORT TERM GOAL #1   Title  Pt will be independent with HEP to improve cervical AROM and strength to return to pain free completion of ADL's    Baseline 11/22: initiated    Time 4    Period Weeks    Status New    Target Date 03/22/21               PT Long Term Goals - 02/22/21 1823       PT LONG TERM GOAL #1   Title Pt will improve FOTO score to target to display improvement in functional mobility and ADL completion    Baseline 11/22: next visit    Time 8    Period Weeks    Status New    Target Date 04/19/21      PT LONG TERM GOAL #2   Title Pt will improve L grip strength to 75% of dominant hand (RUE) to return to ability to perform needed ADL's requring use of L hand    Baseline 11/22: R: 55.4 lbs, L: 27.9 lbs    Time 8    Period Weeks    Status New    Target Date 04/19/21      PT LONG TERM GOAL #3   Title Pt will report < 4/10 NPS in radicular pain with LUE use to display  improvement in symptoms with ADL's    Baseline 11/22: 9-10/10 pain with use of L hand for all tasks    Time 8    Period Weeks    Status New    Target Date 04/19/21      PT LONG TERM GOAL #4   Title Pt will display full, pain free cervical AROM to indicate improvements in cervical related pain    Baseline 11/22: flex: 60, extension: 42*, R/L lat flex: 20*/35*, R/L rotation: 45*/80*    Time 8    Period Weeks    Status New    Target Date 04/19/21                   Plan - 03/16/21 1304     Clinical Impression Statement Cervical AROM: flexion 55 deg./ extension 50 deg./ R lateral flexion 48 deg. (L sided neck pain)/ L lateral flexion 56 deg./ R rotn. 70 deg./ L rotn. 65 deg. (pain).  Grip strength: R 61#/ L 33.5#.  L posterior deltoid discomfort/ pain with palpation and referral pain into L wrist/hand.  Pt. continues to present with L UE muscle weakness remains as compared to R UE.  Pt. interested in talking with MD about other treatment options for pain mgmt.  Pt. returns to MD/ pain mgmt. this Thursday to discuss POC.  No change to HEP and pt. will contact PT if any changes to POC after talking with MD.    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/Moderate complexity    Clinical Decision Making Moderate    Rehab Potential Fair    PT Frequency 2x / week    PT Duration 8 weeks    PT Treatment/Interventions ADLs/Self Care Home Management;Aquatic Therapy;Cryotherapy;Electrical Stimulation;Moist Heat;Traction;Canalith Repostioning;DME Instruction;Gait training;Functional mobility training;Therapeutic activities;Therapeutic exercise;Balance training;Neuromuscular  re-education;Stair training;Patient/family education;Manual techniques;Dry needling;Vestibular;Passive range of motion;Spinal Manipulations;Joint Manipulations    PT Next Visit Plan Discuss MD appt.    PT Home Exercise Plan Access Code [email or web address]    Consulted and Agree with Plan of Care Patient;Family member/caregiver  Family Member Consulted Husband             Patient will benefit from skilled therapeutic intervention in order to improve the following deficits and impairments:  Pain, Decreased mobility, Postural dysfunction, Decreased range of motion, Decreased strength, Hypomobility, Decreased balance  Visit Diagnosis: Muscle weakness (generalized)  Radiculopathy, cervicothoracic region     Problem List Patient Active Problem List   Diagnosis Date Noted   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/2021   Cervical paraspinal muscle spasm 02/10/2021   Gastroesophageal reflux disease 12/28/2020   Personal history of colonic polyps    Polyp of sigmoid colon    Abdominal pain, epigastric    Patient noncompliant with statin medication 09/12/2019   Type II diabetes mellitus with complication ([place]) 94/32/7614   Hyperlipidemia associated with type 2 diabetes mellitus ([place]) 05/17/2018   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 [place], [place], [address] Phone: [phone]   Fax:  [phone]  Name: Melinda Ford MRN: [id number] Date of Birth:  [birth year]

## 2021-03-16 NOTE — Therapy (Signed)
[facility] [address] [place], [place], [address] Phone: [phone]   Fax:  [phone]  Physical Therapy Treatment  Patient Details  Name: Melinda Ford MRN: [id number] Date of Birth: 1945-10-07 Referring Provider (PT): Rosette Reveal MD   Encounter Date: 03/09/2021   PT End of Session - 03/16/21 1140     Visit Number 4    Number of Visits 17    Date for PT Re-Evaluation 04/19/21    Authorization - Visit Number 4    Authorization - Number of Visits 10    PT Start Time 9323    PT Stop Time 1420    PT Time Calculation (min) 42 min    Activity Tolerance Patient limited by pain    Behavior During Therapy [facility] for tasks assessed/performed             Past Medical History:  Diagnosis Date   Allergy    Arthritis    Cataract    Diabetes mellitus without complication ([place])    Hyperlipidemia    Hypertension    Motion sickness    planes   OSA (obstructive sleep apnea) 08/23/2016   CPAP   Osteoporosis    Presence of dental prosthetic device    implants   Radiculopathy of lumbosacral region 06/22/2016   Right lateral epicondylitis 06/04/2017   Right sided sciatica 03/09/2016   Sleep apnea     Past Surgical History:  Procedure Laterality Date   ABDOMINAL HYSTERECTOMY     CATARACT EXTRACTION Right 2020   COLONOSCOPY WITH PROPOFOL N/A 10/10/2019   Procedure: COLONOSCOPY WITH PROPOFOL;  Surgeon: Lucilla Lame, MD;  Location: [place];  Service: Endoscopy;  Laterality: N/A;   ESOPHAGOGASTRODUODENOSCOPY (EGD) WITH PROPOFOL N/A 10/10/2019   Procedure: ESOPHAGOGASTRODUODENOSCOPY (EGD) WITH PROPOFOL;  Surgeon: Lucilla Lame, MD;  Location: [place];  Service: Endoscopy;  Laterality: N/A;  Diabetic - insulin pump and oral meds sleep apnea   POLYPECTOMY  10/10/2019   Procedure: POLYPECTOMY;  Surgeon: Lucilla Lame, MD;  Location: [place];  Service: Endoscopy;;    There were no vitals filed for this  visit.   Subjective Assessment - 03/16/21 1136     Subjective Pt. states she had increase pain after last tx. session.  Pt. returns to MD on Thursday 03/17/21.  Pt. c/o 5-6/10 L shoulder/ forearm/ wrist pain currently at rest.    Patient Stated Goals Decrease neck/ L UE pain symptoms.    Currently in Pain? Yes    Pain Score 6     Pain Location Neck    Pain Orientation Left    Pain Onset More than a month ago              Manual tx.:   Reassessment of B shoulder/ cervical AROM in seated/supine posture.   Supine cervical traction 5x with static holds as tolerated.   Supine L/R UT and levator manual stretches to pain tolerable range.  Good cervical rotn./ lateral flexion with increase in pain at end-range   Supine pec stretches 3x.     STM to L/R UT and posterior deltoid in supine position.    Supine L ULTT (radial/ ulnar/ median) 2x30 sec. Each.   Mechanical traction with Saunders cervical unit with 15-20# psi/ static holds.  PT assist t/o tx. To monitor/manage symptoms and proper set-up.  No change in symptoms.       PT Short Term Goals - 02/22/21 5573  PT SHORT TERM GOAL #1   Title Pt will be independent with HEP to improve cervical AROM and strength to return to pain free completion of ADL's    Baseline 11/22: initiated    Time 4    Period Weeks    Status New    Target Date 03/22/21               PT Long Term Goals - 02/22/21 1823       PT LONG TERM GOAL #1   Title Pt will improve FOTO score to target to display improvement in functional mobility and ADL completion    Baseline 11/22: next visit    Time 8    Period Weeks    Status New    Target Date 04/19/21      PT LONG TERM GOAL #2   Title Pt will improve L grip strength to 75% of dominant hand (RUE) to return to ability to perform needed ADL's requring use of L hand    Baseline 11/22: R: 55.4 lbs, L: 27.9 lbs    Time 8    Period Weeks    Status New    Target Date 04/19/21      PT LONG  TERM GOAL #3   Title Pt will report < 4/10 NPS in radicular pain with LUE use to display improvement in symptoms with ADL's    Baseline 11/22: 9-10/10 pain with use of L hand for all tasks    Time 8    Period Weeks    Status New    Target Date 04/19/21      PT LONG TERM GOAL #4   Title Pt will display full, pain free cervical AROM to indicate improvements in cervical related pain    Baseline 11/22: flex: 60, extension: 42*, R/L lat flex: 20*/35*, R/L rotation: 45*/80*    Time 8    Period Weeks    Status New    Target Date 04/19/21                   Plan - 03/16/21 1142     Clinical Impression Statement Pts. primary limitation is neck/ L shoulder pain into L wrist.  Pt. continues to present with good cervical/ L shoulder AROM (all planes) and current FOTO score 49/66.  Pt. interested in talking with MD about injections/ pain mgmt.    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/Moderate complexity    Clinical Decision Making Moderate    Rehab Potential Fair    PT Frequency 2x / week    PT Duration 8 weeks    PT Treatment/Interventions ADLs/Self Care Home Management;Aquatic Therapy;Cryotherapy;Electrical Stimulation;Moist Heat;Traction;Canalith Repostioning;DME Instruction;Gait training;Functional mobility training;Therapeutic activities;Therapeutic exercise;Balance training;Neuromuscular re-education;Stair training;Patient/family education;Manual techniques;Dry needling;Vestibular;Passive range of motion;Spinal Manipulations;Joint Manipulations    PT Next Visit Plan Send MD note.    PT Home Exercise Plan Access Code [email or web address]    Consulted and Agree with Plan of Care  Patient;Family member/caregiver    Family Member Consulted Husband             Patient will benefit from skilled therapeutic intervention in order to improve the following deficits and impairments:  Pain, Decreased mobility, Postural dysfunction, Decreased range of motion, Decreased strength, Hypomobility, Decreased balance  Visit Diagnosis: Muscle weakness (generalized)  Radiculopathy, cervicothoracic region     Problem  List Patient Active Problem List   Diagnosis Date Noted   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/2021   Cervical paraspinal muscle spasm 02/10/2021   Gastroesophageal reflux disease 12/28/2020   Personal history of colonic polyps    Polyp of sigmoid colon    Abdominal pain, epigastric    Patient noncompliant with statin medication 09/12/2019   Type II diabetes mellitus with complication ([place]) 19/37/9024   Hyperlipidemia associated with type 2 diabetes mellitus ([place]) 05/17/2018   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 02/22/2018   Chronic midline thoracic back pain 02/22/2018   Tinea versicolor 09/20/2016   OSA (obstructive sleep apnea) 08/23/2016   Allergic rhinitis 07/13/2016   Overweight (BMI 25.0-29.9) 10/01/2015   Vitamin D deficiency 09/18/2014   Onychomycosis 09/16/2014   Degenerative disc disease, lumbar 09/16/2014   Benign essential HTN 10/10/2010   Osteoporosis 10/10/2010   Pura Spice, PT, DPT # [phone] 03/16/2021, 11:46 AM  [facility] [address] [place], [place], [address] Phone: [phone]   Fax:  [phone]  Name: Melinda Ford MRN: [id number] Date of Birth: 1945-04-27

## 2021-03-17 ENCOUNTER — Ambulatory Visit
Payer: HMO | Attending: Student in an Organized Health Care Education/Training Program | Admitting: Student in an Organized Health Care Education/Training Program

## 2021-03-17 ENCOUNTER — Ambulatory Visit: Payer: HMO | Admitting: Physical Therapy

## 2021-03-17 ENCOUNTER — Encounter: Payer: Self-pay | Admitting: Physical Therapy

## 2021-03-17 ENCOUNTER — Encounter: Payer: Self-pay | Admitting: Student in an Organized Health Care Education/Training Program

## 2021-03-17 ENCOUNTER — Other Ambulatory Visit: Payer: Self-pay

## 2021-03-17 ENCOUNTER — Encounter: Payer: Self-pay | Admitting: Family Medicine

## 2021-03-17 ENCOUNTER — Ambulatory Visit (INDEPENDENT_AMBULATORY_CARE_PROVIDER_SITE_OTHER): Payer: HMO | Admitting: Family Medicine

## 2021-03-17 VITALS — BP 151/85 | HR 80 | Temp 97.3°F | Resp 16 | Ht 62.0 in | Wt 160.0 lb

## 2021-03-17 DIAGNOSIS — M4722 Other spondylosis with radiculopathy, cervical region: Secondary | ICD-10-CM | POA: Diagnosis not present

## 2021-03-17 DIAGNOSIS — M62838 Other muscle spasm: Secondary | ICD-10-CM | POA: Diagnosis not present

## 2021-03-17 DIAGNOSIS — M5412 Radiculopathy, cervical region: Secondary | ICD-10-CM | POA: Diagnosis not present

## 2021-03-17 DIAGNOSIS — M6281 Muscle weakness (generalized): Secondary | ICD-10-CM

## 2021-03-17 DIAGNOSIS — M5413 Radiculopathy, cervicothoracic region: Secondary | ICD-10-CM

## 2021-03-17 DIAGNOSIS — M4802 Spinal stenosis, cervical region: Secondary | ICD-10-CM | POA: Insufficient documentation

## 2021-03-17 DIAGNOSIS — G894 Chronic pain syndrome: Secondary | ICD-10-CM | POA: Diagnosis not present

## 2021-03-17 MED ORDER — TIZANIDINE HCL 4 MG PO TABS: 4.0000 mg | ORAL_TABLET | Freq: Two times a day (BID) | ORAL | 0 refills | Status: AC | PRN

## 2021-03-17 MED ORDER — GABAPENTIN 100 MG PO CAPS: 100.0000 mg | ORAL_CAPSULE | Freq: Every day | ORAL | 0 refills | Status: DC

## 2021-03-17 NOTE — Progress Notes (Signed)
°  ° °  Primary Care / Sports Medicine Office Visit  Patient Information:  Patient ID: Melinda Ford, female DOB: 17-Jun-1945 Age: 75 y.o. MRN: [id number]   Melinda Ford is a pleasant 75 y.o. female presenting with the following:  Chief Complaint  Patient presents with   Cervical spondylosis with radiculopathy    Following with PT and has an appointment with Pain Management and PT today; 8/10 pain    Patient Active Problem List   Diagnosis Date Noted   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/2021   Cervical paraspinal muscle spasm 02/10/2021   Gastroesophageal reflux disease 12/28/2020   Personal history of colonic polyps    Polyp of sigmoid colon    Abdominal pain, epigastric    Patient noncompliant with statin medication 09/12/2019   Type II diabetes mellitus with complication ([place]) 57/26/2035   Hyperlipidemia associated with type 2 diabetes mellitus ([place]) 05/17/2018   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 02/22/2018   Chronic midline thoracic back pain 02/22/2018   Tinea versicolor 09/20/2016   OSA (obstructive sleep apnea) 08/23/2016   Allergic rhinitis 07/13/2016   Overweight (BMI 25.0-29.9) 10/01/2015   Vitamin D deficiency 09/18/2014   Onychomycosis 09/16/2014   Degenerative disc disease, lumbar 09/16/2014   Benign essential HTN 10/10/2010   Osteoporosis 10/10/2010    Vitals:   03/17/21 1045  BP: 122/82  Pulse: 71  SpO2: 97%   Vitals:   03/17/21 1045  Weight: 160 lb (72.6 kg)  Height: 5\' 2"  (1.575 m)   Body mass index is 29.26 kg/m.  No results found.   Independent interpretation of notes and tests performed by another provider:   None  Procedures performed:   None  Pertinent History, Exam, Impression, and Recommendations:   Cervical paraspinal muscle spasm See additional assessment(s) for plan details.  Cervical spondylosis with radiculopathy Patient with persistent cervical spondylosis with left-sided radiculopathy,  has been stable with reported minor improvement following medications and physical therapy.  Denies any new symptomatology.  Given the degree of symptomatology, chronicity of symptoms, treatments to date, a previous referral to pain and spine have been placed, she has a visit later today.  Given that she is seeing pain and spine, medication management was performed and will defer to pain and spine for refills and modifications of current medication regimen.  Additionally, I advised her to maintain follow-up and to discuss various treatment plans with their group and pursue them accordingly.  We will follow peripherally on this issue.   Orders & Medications No orders of the defined types were placed in this encounter.  No orders of the defined types were placed in this encounter.    No follow-ups on file.     Montel Culver, MD   Primary Care Sports Medicine [facility]

## 2021-03-17 NOTE — Assessment & Plan Note (Addendum)
Patient with persistent cervical spondylosis with left-sided radiculopathy, has been stable with reported minor improvement following medications and physical therapy.  Denies any new symptomatology.  Given the degree of symptomatology, chronicity of symptoms, treatments to date, a previous referral to pain and spine have been placed, she has a visit later today.  Given that she is seeing pain and spine, medication management was performed and will defer to pain and spine for refills and modifications of current medication regimen.  Additionally, I advised her to maintain follow-up and to discuss various treatment plans with their group and pursue them accordingly.  We will follow peripherally on this issue.

## 2021-03-17 NOTE — Progress Notes (Signed)
Safety precautions to be maintained throughout the outpatient stay will include: orient to surroundings, keep bed in low position, maintain call bell within reach at all times, provide assistance with transfer out of bed and ambulation.  

## 2021-03-17 NOTE — Assessment & Plan Note (Signed)
See additional assessment(s) for plan details. 

## 2021-03-17 NOTE — Progress Notes (Signed)
Patient: Melinda Ford  Service Category: E/M  Provider: Gillis Santa, MD  DOB: [birth year]  DOS: 03/17/2021  Referring Provider: Glean Hess, MD  MRN: [id number]  Setting: Ambulatory outpatient  PCP: Glean Hess, MD  Type: New Patient  Specialty: Interventional Pain Management    Location: Office  Delivery: Face-to-face     Primary Reason(s) for Visit: Encounter for initial evaluation of one or more chronic problems (new to examiner) potentially causing chronic pain, and posing a threat to normal musculoskeletal function. (Level of risk: High) CC: Neck Pain  HPI  Melinda Ford is a 75 y.o. year old, female patient, who comes for the first time to our practice referred by Glean Hess, MD for our initial evaluation of her chronic pain. 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 Today she comes in for evaluation of her Neck Pain  Pain Assessment: Location: Left Neck Radiating: down left arm to hand. effects all finger Onset: More than a month ago (40 days) Duration: Chronic pain Quality: Discomfort, Aching, Constant, Throbbing Severity: 8 /10 (subjective, self-reported pain score)  Effect on ADL: "Everythibng that do" lifting, moving arm, holding  thing Timing:   Modifying factors: rest, medication BP: (!) 151/85   HR: 80  Onset and Duration: Sudden and Present less than 3 months Cause of pain: Unknown Severity: NAS-11 at its worse: 10/10, NAS-11 at its best: 5/10, NAS-11 now: 10/10, and NAS-11 on the average: 7/10 Timing: Morning, Night, and After activity or exercise Aggravating Factors:  typing Alleviating Factors: Lying down, Medications, Resting, and Warm showers or baths Associated Problems: Constipation, Numbness, Spasms, Temperature changes, Tingling, Weakness, Pain that wakes patient up, and Pain that does not allow patient to sleep Quality of Pain: Aching, Agonizing, Intermittent, Deep, Disabling, Feeling of weight, Heavy, Sharp, Toothache-like, and Uncomfortable Previous Examinations or Tests: X-rays Previous Treatments: Narcotic medications and Physical Therapy  Melinda Ford is a very pleasant 75 year old female who is being referred by Dr. Zigmund Daniel for neck pain with radiation into left arm with associated weakness along with numbness and tingling.  Has started in November.  No inciting or traumatic event.  Was evaluated by urgent care.  They believe that it was cervical and periscapular muscle spasms.  She was referred to Dr. Zigmund Daniel who did a cervical spine x-ray that shows neuroforaminal stenosis and she was referred here for further management.  She was on gabapentin 600 mg at night but states that it was resulting in side effects of sedation in fusion.  She has tried diclofenac and Flexeril as well.  She does endorse weakness and states that she has trouble holding utensils and has issues with fine motor control of her left hand.  She is currently in physical therapy.  Historic Controlled Substance Pharmacotherapy Review  PMP and historical list of controlled substances: Hydrocodone 5 mg nightly as needed Historical Monitoring: The patient  reports no history of drug use. List of all UDS Test(s): No results  found for:  MDMA, COCAINSCRNUR, PCPSCRNUR, [place], CANNABQUANT, THCU, [place] List of other Serum/Urine Drug Screening Test(s):  No results found for: AMPHSCRSER, BARBSCRSER, BENZOSCRSER, COCAINSCRSER, COCAINSCRNUR, PCPSCRSER, PCPQUANT, THCSCRSER, THCU, CANNABQUANT, OPIATESCRSER, OXYSCRSER, PROPOXSCRSER, ETH Historical Background Evaluation: [place] PMP: PDMP not reviewed this encounter. Online review of the past 4-monthperiod conducted.             [place] Department of public safety, offender search: (Editor, commissioningInformation) Non-contributory Risk Assessment Profile: Aberrant behavior: None observed or detected today Risk factors for fatal opioid overdose: None identified today Fatal overdose hazard ratio (HR): Calculation deferred Non-fatal overdose hazard ratio (HR): Calculation deferred Risk of opioid abuse or dependence: 0.7-3.0% with doses ? 36 MME/day and 6.1-26% with doses ? 120 MME/day. Substance use disorder (SUD) risk level: See below Personal History of Substance Abuse (SUD-Substance use disorder):  Alcohol: Negative  Illegal Drugs: Negative  Rx Drugs: Negative  ORT Risk Level calculation: Low Risk  Opioid Risk Tool - 03/17/21 1318       Family History of Substance Abuse   Alcohol Positive Female   uncle   Illegal Drugs Negative    Rx Drugs Negative      Personal History of Substance Abuse   Alcohol Negative    Illegal Drugs Negative    Rx Drugs Negative      Psychological Disease   Psychological Disease Negative    ADD Negative    OCD Negative    Bipolar Negative    Schizophrenia Negative      Total Score   Opioid Risk Tool Scoring 3    Opioid Risk Interpretation Low Risk            ORT Scoring interpretation table:  Score <3 = Low Risk for SUD  Score between 4-7 = Moderate Risk for SUD  Score >8 = High Risk for Opioid Abuse   PHQ-2 Depression Scale:  Total score:    PHQ-2 Scoring interpretation table: (Score and probability of major depressive disorder)  Score 0 = No  depression  Score 1 = 15.4% Probability  Score 2 = 21.1% Probability  Score 3 = 38.4% Probability  Score 4 = 45.5% Probability  Score 5 = 56.4% Probability  Score 6 = 78.6% Probability   PHQ-9 Depression Scale:  Total score:    PHQ-9 Scoring interpretation table:  Score 0-4 = No depression  Score 5-9 = Mild depression  Score 10-14 = Moderate depression  Score 15-19 = Moderately severe depression  Score 20-27 = Severe depression (2.4 times higher risk of SUD and 2.89 times higher risk of overuse)   Pharmacologic Plan: As per protocol, I have not taken over any controlled substance management, pending the results of ordered tests and/or consults.            Initial impression: Pending review of available data and ordered tests.  Meds   Current Outpatient Medications:    acetaminophen (TYLENOL) 500 MG tablet, Take 1,000 mg by mouth 2 (two) times daily. As needed, Disp: , Rfl:    amLODipine (NORVASC) 10 MG tablet, Take 1 tablet (10 mg total) by mouth daily., Disp: 90 tablet, Rfl: 3   Calcium Carbonate-Vit D-Min (CALCIUM 1200 PO), Take 2 each by mouth daily., Disp: , Rfl:    Cholecalciferol (VITAMIN D) 2000 UNITS CAPS, Take 1 capsule (2,000 Units total) by mouth daily., Disp: 30 capsule, Rfl:    Continuous Blood Gluc Sensor (FREESTYLE LIBRE 2 SENSOR) MISC, APPLY 1 SENSOR TO BODY EVERY 14 DAYS TO  MONITOR BLOOD SUGAR, Disp: 2 each, Rfl: 0   gabapentin (NEURONTIN) 100 MG capsule, Take 1-3 capsules (100-300 mg total) by mouth at bedtime. Follow written titration schedule., Disp: 90 capsule, Rfl: 0   GINKGO BILOBA COMPLEX PO, Take 1 capsule by mouth daily., Disp: , Rfl:    HYDROcodone-acetaminophen (NORCO/VICODIN) 5-325 MG tablet, Take 1 tablet by mouth at bedtime as needed for moderate pain., Disp: 10 tablet, Rfl: 0   insulin aspart (NOVOLOG) 100 UNIT/ML injection, USE A MAX OF 78 UNITS DAILY VIA INSULIN PUMP, Disp: 80 mL, Rfl: 2   Insulin Disposable Pump (V-GO 20) KIT, 1  ONCE DAILY, Disp: 30  kit, Rfl: 11   losartan (COZAAR) 100 MG tablet, Take 1 tablet by mouth once daily, Disp: 90 tablet, Rfl: 0   NON FORMULARY, CPAP @@ 9 cm H2O nightly, Disp: , Rfl:    omeprazole (PRILOSEC) 20 MG capsule, Take 1 capsule (20 mg total) by mouth daily., Disp: 30 capsule, Rfl: 12   pravastatin (PRAVACHOL) 20 MG tablet, Take 1 tablet by mouth once daily (Patient taking differently: Take 20 mg by mouth 3 (three) times a week.), Disp: 90 tablet, Rfl: 0   tiZANidine (ZANAFLEX) 4 MG tablet, Take 1 tablet (4 mg total) by mouth every 12 (twelve) hours as needed for muscle spasms., Disp: 90 tablet, Rfl: 0   Continuous Blood Gluc Receiver (FREESTYLE LIBRE 36 DAY READER) DEVI, See admin instructions. (Patient not taking: Reported on 03/17/2021), Disp: , Rfl: 0   Semaglutide,0.25 or 0.5MG/DOS, (OZEMPIC, 0.25 OR 0.5 MG/DOSE,) 2 MG/1.5ML SOPN, Inject 0.5 mg into the skin once a week. (Patient not taking: Reported on 03/17/2021), Disp: 1.5 mL, Rfl: 2  Imaging Review  DG Cervical Spine Complete  Narrative CLINICAL DATA:  Neck pain, left shoulder pain  EXAM: CERVICAL SPINE - COMPLETE 4+ VIEW  COMPARISON:  None.  FINDINGS: No recent fracture is seen. Osteopenia is seen in bony structures. There is straightening of lordosis which may be due to positioning or muscle spasm. Degenerative changes are noted with disc space narrowing and bony spurs from C4-C7 levels. There is encroachment of neural foramina from C4-C7 levels, more so on the right side. Prevertebral soft tissues are unremarkable.  IMPRESSION: No recent fracture is seen in the cervical spine. Cervical spondylosis with significant encroachment of neural foramina from C4-C7 levels.   Electronically Signed By: Elmer Picker M.D. On: 02/12/2021 12:27  DG Shoulder Left  Narrative CLINICAL DATA:  Pain  EXAM: LEFT SHOULDER - 2+ VIEW  COMPARISON:  None.  FINDINGS: There is no evidence of fracture or dislocation. There is no evidence  of arthropathy or other focal bone abnormality. Soft tissues are unremarkable.  IMPRESSION: No radiographic abnormality is seen in the left shoulder.   Electronically Signed By: Elmer Picker M.D. On: 02/12/2021 12:25  Narrative CLINICAL DATA:  75 year old female with chronic left side mid back pain and tenderness.  EXAM: THORACIC SPINE 2 VIEWS  COMPARISON:  Chest CT 04/02/2017 and earlier.  FINDINGS: Normal thoracic spine segmentation. Stable vertebral height and alignment with preserved thoracic kyphosis. Relatively preserved disc spaces throughout. However, a partially calcified T7-T8 disc protrusion was demonstrated by CT in 2018. cervicothoracic junction alignment is within normal limits. Cervical disc and endplate degeneration is partially visible. The visible upper lumbar levels appear intact. Posterior ribs appear grossly intact and thoracic and upper abdominal visceral contours appear negative.  IMPRESSION: 1. Normal for age radiographic appearance of the thoracic spine. A partially calcified T7-T8 disc protrusion  was demonstrated by CT in 2018. 2. Cervical spine disc and endplate degeneration.   Electronically Signed By: Genevie Ann M.D. On: 02/25/2018 08:21 DG Lumbar Spine 2-3 Views  Narrative CLINICAL DATA:  Lumbago after playing tennis  EXAM: LUMBAR SPINE - 2-3 VIEW  COMPARISON:  None.  FINDINGS: Frontal, lateral, and spot lumbosacral lateral images were obtained. There are 5 non-rib-bearing lumbar type vertebral bodies. There is mild thoracolumbar levoscoliosis. There is no fracture or spondylolisthesis. There is slight disc space narrowing at L4-5. Other disc spaces appear normal. No erosive change.  IMPRESSION: Slight disc space narrowing at L4-5. Other disc spaces appear normal. Slight scoliosis present. No fracture or spondylolisthesis.   Electronically Signed By: Lowella Grip III M.D. On: 12/01/2015 11:10  Narrative CLINICAL  DATA:  Pain after playing tennis  EXAM: DG HIP (WITH OR WITHOUT PELVIS) 2-3V RIGHT  COMPARISON:  None.  FINDINGS: Frontal pelvis as well as frontal and lateral right hip images were obtained. There is no demonstrable fracture or dislocation. The joint spaces appear normal. No erosive change.  IMPRESSION: No fracture or dislocation.  No apparent arthropathic change.   Electronically Signed By: Lowella Grip III M.D. On: 12/01/2015 11:11  Narrative CLINICAL DATA:  Acute RIGHT foot pain  EXAM: RIGHT FOOT COMPLETE - 3+ VIEW  COMPARISON:  04/23/2018 1,009  FINDINGS: Osseous demineralization.  Hallux valgus with minimal degenerative changes at first MTP joint.  Remaining joint spaces preserved.  New lucent lesion at the distal fourth metatarsal, where a well-circumscribed expansile lesion 9 x 8 mm is identified small plantar calcaneal spur.  No acute fracture, dislocation, or bone destruction.  Mild soft tissue swelling overlying the dorsum of the foot at the level of metatarsals.  IMPRESSION: Osseous demineralization without valgus and degenerative changes first MTP joint.  New lesion distal RIGHT fourth metatarsal, well-circumscribed 9 x 8 mm expansile lucent focus, question osteochondroma.   Electronically Signed By: Lavonia Dana M.D. On: 08/18/2019 15:56    Narrative CLINICAL DATA:  Stepped off a curb injuring lateral side of LEFT foot today, fall, no prior injuries, initial encounter  EXAM: LEFT FOOT - COMPLETE 3+ VIEW  COMPARISON:  None  FINDINGS: Diffuse osseous demineralization.  Minimal joint space narrowing first MTP joint with hallux valgus and medial soft tissue swelling.  Transverse nondisplaced fracture at base of fifth metatarsal.  No additional fracture, dislocation, or bone destruction.  Tiny plantar calcaneal spur.  IMPRESSION: Nondisplaced transverse fracture at base of LEFT fifth metatarsal.  Osseous  demineralization.   Electronically Signed By: Lavonia Dana M.D. On: 10/08/2015 15:21   Complexity Note: Imaging results reviewed. Results shared with Melinda Ford, using Layman's terms.                         ROS  Cardiovascular: High blood pressure Pulmonary or Respiratory: Temporary stoppage of breathing during sleep Neurological: No reported neurological signs or symptoms such as seizures, abnormal skin sensations, urinary and/or fecal incontinence, being born with an abnormal open spine and/or a tethered spinal cord Psychological-Psychiatric: No reported psychological or psychiatric signs or symptoms such as difficulty sleeping, anxiety, depression, delusions or hallucinations (schizophrenial), mood swings (bipolar disorders) or suicidal ideations or attempts Gastrointestinal: No reported gastrointestinal signs or symptoms such as vomiting or evacuating blood, reflux, heartburn, alternating episodes of diarrhea and constipation, inflamed or scarred liver, or pancreas or irrregular and/or infrequent bowel movements Genitourinary: No reported renal or genitourinary signs or symptoms such as difficulty  voiding or producing urine, peeing blood, non-functioning kidney, kidney stones, difficulty emptying the bladder, difficulty controlling the flow of urine, or chronic kidney disease Hematological: No reported hematological signs or symptoms such as prolonged bleeding, low or poor functioning platelets, bruising or bleeding easily, hereditary bleeding problems, low energy levels due to low hemoglobin or being anemic Endocrine: High blood sugar requiring insulin (IDDM) Rheumatologic: No reported rheumatological signs and symptoms such as fatigue, joint pain, tenderness, swelling, redness, heat, stiffness, decreased range of motion, with or without associated rash Musculoskeletal: Negative for myasthenia gravis, muscular dystrophy, multiple sclerosis or malignant hyperthermia Work History:  Retired  Allergies  Melinda Ford is allergic to ace inhibitors, wheat bran, and gramineae pollens.  Laboratory Chemistry Profile   Renal Lab Results  Component Value Date   BUN 25 (H) 10/01/2020   CREATININE 0.85 10/01/2020   BCR 35 (H) 09/10/2015   GFR 66.73 10/01/2020   GFRAA 113 09/10/2015   GFRNONAA 98 09/10/2015   SPECGRAV 1.015 05/17/2018   PHUR 5.0 05/17/2018   PROTEINUR Negative 05/17/2018     Electrolytes Lab Results  Component Value Date   NA 137 10/01/2020   K 4.4 10/01/2020   CL 105 10/01/2020   CALCIUM 10.1 10/01/2020     Hepatic Lab Results  Component Value Date   AST 24 10/01/2020   ALT 34 10/01/2020   ALBUMIN 4.0 10/01/2020   ALKPHOS 76 10/01/2020   LIPASE 21 05/17/2018     ID Lab Results  Component Value Date   SARSCOV2NAA NEGATIVE 10/08/2019     Bone Lab Results  Component Value Date   VD25OH 34.0 12/28/2020     Endocrine Lab Results  Component Value Date   GLUCOSE 152 (H) 10/01/2020   GLUCOSEU >=1000 (A) 06/17/2019   HGBA1C 7.5 (A) 01/04/2021   TSH 1.650 12/28/2020     Neuropathy Lab Results  Component Value Date   VITAMINB12 >2000 (H) 09/17/2014   HGBA1C 7.5 (A) 01/04/2021     CNS No results found for: COLORCSF, APPEARCSF, RBCCOUNTCSF, WBCCSF, POLYSCSF, LYMPHSCSF, EOSCSF, PROTEINCSF, GLUCCSF, JCVIRUS, CSFOLI, IGGCSF, LABACHR, ACETBL, LABACHR, ACETBL   Inflammation (CRP: Acute   ESR: Chronic) Lab Results  Component Value Date   ESRSEDRATE 12 01/15/2015     Rheumatology No results found for: RF, ANA, LABURIC, URICUR, LYMEIGGIGMAB, LYMEABIGMQN, HLAB27   Coagulation Lab Results  Component Value Date   PLT 286 12/28/2020     Cardiovascular Lab Results  Component Value Date   HGB 14.7 12/28/2020   HCT 45.1 12/28/2020     Screening Lab Results  Component Value Date   SARSCOV2NAA NEGATIVE 10/08/2019     Cancer No results found for: CEA, CA125, LABCA2   Allergens No results found for: ALMOND, APPLE, ASPARAGUS,  AVOCADO, BANANA, BARLEY, BASIL, BAYLEAF, GREENBEAN, LIMABEAN, WHITEBEAN, BEEFIGE, REDBEET, BLUEBERRY, BROCCOLI, CABBAGE, MELON, CARROT, CASEIN, CASHEWNUT, CAULIFLOWER, CELERY     Note: Lab results reviewed.  Melinda Ford  Drug: Melinda Ford  reports no history of drug use. Alcohol:  reports that she does not currently use alcohol after a past usage of about 1.0 standard drink per week. Tobacco:  reports that she has never smoked. She has never used smokeless tobacco. Medical:  has a past medical history of Allergy, Arthritis, Cataract, Diabetes mellitus without complication ([place]), Hyperlipidemia, Hypertension, Motion sickness, OSA (obstructive sleep apnea) (08/23/2016), Osteoporosis, Presence of dental prosthetic device, Radiculopathy of lumbosacral region (06/22/2016), Right lateral epicondylitis (06/04/2017), Right sided sciatica (03/09/2016), and Sleep apnea. Family: family history includes Alcohol abuse  in her father; Asthma in her brother; Breast cancer (age of onset: 54) in an other family member; Breast cancer (age of onset: 4) in her cousin; Diabetes in her brother, maternal aunt, maternal uncle, and sister; Early death in her maternal uncle; Heart disease in her maternal aunt; Mental illness in her father; Stroke in her father.  Past Surgical History:  Procedure Laterality Date   ABDOMINAL HYSTERECTOMY     CATARACT EXTRACTION Right 2020   COLONOSCOPY WITH PROPOFOL N/A 10/10/2019   Procedure: COLONOSCOPY WITH PROPOFOL;  Surgeon: Lucilla Lame, MD;  Location: [facility];  Service: Endoscopy;  Laterality: N/A;   ESOPHAGOGASTRODUODENOSCOPY (EGD) WITH PROPOFOL N/A 10/10/2019   Procedure: ESOPHAGOGASTRODUODENOSCOPY (EGD) WITH PROPOFOL;  Surgeon: Lucilla Lame, MD;  Location: [facility];  Service: Endoscopy;  Laterality: N/A;  Diabetic - insulin pump and oral meds sleep apnea   POLYPECTOMY  10/10/2019   Procedure: POLYPECTOMY;  Surgeon: Lucilla Lame, MD;  Location: [facility];  Service:  Endoscopy;;   Active Ambulatory Problems    Diagnosis Date Noted   Benign essential HTN 10/10/2010   Osteoporosis 10/10/2010   Onychomycosis 09/16/2014   Degenerative disc disease, lumbar 09/16/2014   Vitamin D deficiency 09/18/2014   Overweight (BMI 25.0-29.9) 10/01/2015   Allergic rhinitis 07/13/2016   OSA (obstructive sleep apnea) 08/23/2016   Tinea versicolor 09/20/2016   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 02/22/2018   Chronic midline thoracic back pain 02/22/2018   Hyperlipidemia associated with type 2 diabetes mellitus ([place]) 05/17/2018   Type II diabetes mellitus with complication ([place]) 93/81/0175   Patient noncompliant with statin medication 09/12/2019   Personal history of colonic polyps    Polyp of sigmoid colon    Abdominal pain, epigastric    Gastroesophageal reflux disease 12/28/2020   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/2021   Cervical paraspinal muscle spasm 02/10/2021   Cervical radicular pain (left C5,6) 03/17/2021   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/2021   Chronic pain syndrome 03/17/2021   Resolved Ambulatory Problems    Diagnosis Date Noted   Chronic hoarseness 10/10/2010   LBP (low back pain) 10/10/2010   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   Hypercholesterolemia without hypertriglyceridemia 03/19/2012   Type 2 diabetes, controlled, with neuropathy ([place]) 10/10/2010   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   Dermatophytic onychia 09/16/2014   Pure hypercholesterolemia 03/19/2012   Type 2 diabetes mellitus with hyperglycemia ([place]) 10/10/2010   Right sided sciatica 03/09/2016   Snoring 05/30/2016   Osteoarthritis of right hip 05/30/2016   Radiculopathy of lumbosacral region 06/22/2016   Lumbar spondylosis 12/02/2015   Right lateral epicondylitis 06/04/2017   Past Medical History:  Diagnosis Date   Allergy    Arthritis    Cataract    Diabetes mellitus without complication ([place])    Hyperlipidemia    Hypertension     Motion sickness    Presence of dental prosthetic device    Sleep apnea    Constitutional Exam  General appearance: Well nourished, well developed, and well hydrated. In no apparent acute distress Vitals:   03/17/21 1308  BP: (!) 151/85  Pulse: 80  Resp: 16  Temp: (!) 97.3 F (36.3 C)  SpO2: 100%  Weight: 160 lb (72.6 kg)  Height: 5' 2"  (1.575 m)   BMI Assessment: Estimated body mass index is 29.26 kg/m as calculated from the following:   Height as of this encounter: 5' 2"  (1.575 m).   Weight as of this encounter: 160 lb (72.6 kg).  BMI interpretation table:  BMI level Category Range association with higher incidence of chronic pain  <18 kg/m2 Underweight   18.5-24.9 kg/m2 Ideal body weight   25-29.9 kg/m2 Overweight Increased incidence by 20%  30-34.9 kg/m2 Obese (Class I) Increased incidence by 68%  35-39.9 kg/m2 Severe obesity (Class II) Increased incidence by 136%  >40 kg/m2 Extreme obesity (Class III) Increased incidence by 254%   Patient's current BMI Ideal Body weight  Body mass index is 29.26 kg/m. Ideal body weight: 50.1 kg (110 lb 7.2 oz) Adjusted ideal body weight: 59.1 kg (130 lb 4.3 oz)   BMI Readings from Last 4 Encounters:  03/17/21 29.26 kg/m  03/17/21 29.26 kg/m  02/17/21 29.45 kg/m  02/10/21 29.81 kg/m   Wt Readings from Last 4 Encounters:  03/17/21 160 lb (72.6 kg)  03/17/21 160 lb (72.6 kg)  02/17/21 161 lb (73 kg)  02/10/21 163 lb (73.9 kg)    Psych/Mental status: Alert, oriented x 3 (person, place, & time)       Eyes: PERLA Respiratory: No evidence of acute respiratory distress  Cervical Spine Area Exam  Skin & Axial Inspection: No masses, redness, edema, swelling, or associated skin lesions Alignment: Symmetrical Functional ROM: Pain restricted ROM      Stability: No instability detected Muscle Tone/Strength: Functionally intact. No obvious neuro-muscular anomalies detected. Sensory (Neurological): Dermatomal pain pattern at C5,  C6 Positive Spurling's on the left   Upper Extremity (UE) Exam    Side: Right upper extremity  Side: Left upper extremity  Skin & Extremity Inspection: Skin color, temperature, and hair growth are WNL. No peripheral edema or cyanosis. No masses, redness, swelling, asymmetry, or associated skin lesions. No contractures.  Skin & Extremity Inspection: Skin color, temperature, and hair growth are WNL. No peripheral edema or cyanosis. No masses, redness, swelling, asymmetry, or associated skin lesions. No contractures.  Functional ROM: Unrestricted ROM          Functional ROM: Unrestricted ROM          Muscle Tone/Strength: Functionally intact. No obvious neuro-muscular anomalies detected.  Muscle Tone/Strength: Functionally intact. No obvious neuro-muscular anomalies detected.  Sensory (Neurological): Unimpaired          Sensory (Neurological): Dermatomal pain pattern          Palpation: No palpable anomalies              Palpation: No palpable anomalies              Provocative Test(s):  Phalen's test: deferred Tinel's test: deferred Apley's scratch test (touch opposite shoulder):  Action 1 (Across chest): deferred Action 2 (Overhead): deferred Action 3 (LB reach): deferred   Provocative Test(s):  Phalen's test: deferred Tinel's test: deferred Apley's scratch test (touch opposite shoulder):  Action 1 (Across chest): deferred Action 2 (Overhead): deferred Action 3 (LB reach): deferred    5 out of 5 strength bilateral upper extremity: Shoulder abduction, elbow flexion, elbow extension, thumb extension.  Assessment  Primary Diagnosis & Pertinent Problem List: The primary encounter diagnosis was Cervical radicular pain (left C5,6). Diagnoses of Foraminal stenosis of cervical region and Chronic pain syndrome were also pertinent to this visit.  Visit Diagnosis (New problems to examiner): 1. Cervical radicular pain (left C5,6)   2. Foraminal stenosis of cervical region   3. Chronic pain  syndrome    Plan of Care (Initial workup plan)  Symptoms consistent with left cervical radiculopathy likely affecting the C5 and C6 nerve roots.  Cervical spine x-ray shows fairly severe multilevel foraminal  stenosis.  She is currently physical therapy and has tried conservative treatment with gabapentin, Flexeril, diclofenac with limited response and/or side effects.  Recommend reintroduction of gabapentin at a lower dose to see if she tolerates it better.  Discontinue baclofen, trial tizanidine which can be more effective for cervicalgia and cervical paraspinal muscle spasms.  Recommend cervical MRI to further evaluate for any neuroforaminal stenosis and canal stenosis.  After cervical MRI results, will discuss treatment plan with patient which may include cervical epidural steroid injection.  Instructed to continue with physical therapy and start gabapentin and tizanidine as below.  We will also obtain urine toxicology screen which is customary for new patients that are being considered for chronic opioid therapy.  Lab Orders         Compliance Drug Analysis, Ur     Imaging Orders         MR CERVICAL SPINE WO CONTRAST      Pharmacotherapy (current): Medications ordered:  Meds ordered this encounter  Medications   gabapentin (NEURONTIN) 100 MG capsule    Sig: Take 1-3 capsules (100-300 mg total) by mouth at bedtime. Follow written titration schedule.    Dispense:  90 capsule    Refill:  0    Fill one day early if pharmacy is closed on scheduled refill date. May substitute for generic if available.   tiZANidine (ZANAFLEX) 4 MG tablet    Sig: Take 1 tablet (4 mg total) by mouth every 12 (twelve) hours as needed for muscle spasms.    Dispense:  90 tablet    Refill:  0    Do not place this medication, or any other prescription from our practice, on "Automatic Refill". Patient may have prescription filled one day early if pharmacy is closed on scheduled refill date.    Medications  administered during this visit: Melinda Ford had no medications administered during this visit.   Pharmacological management options:  Opioid Analgesics: The patient was informed that there is no guarantee that she would be a candidate for opioid analgesics. The decision will be made following CDC guidelines. This decision will be based on the results of diagnostic studies, as well as Melinda Ford's risk profile.  Norco 5 mg nightly pending UDS  Membrane stabilizer:  Trial of gabapentin as above.  Future considerations include Cymbalta, TCA  Muscle relaxant:  Has tried baclofen in the past.  Trial of tizanidine   NSAID: To be determined at a later time  Other analgesic(s): To be determined at a later time   Interventional management options: Melinda Ford was informed that there is no guarantee that she would be a candidate for interventional therapies. The decision will be based on the results of diagnostic studies, as well as Melinda Ford's risk profile.  Procedure(s) under consideration:  Pending results of ordered studies possibly cervical epidural steroid injection    Provider-requested follow-up: Return for After Imaging, will call patient with MRI results and to discuss treatment plan..  I spent a total of 60 minutes reviewing chart data, face-to-face evaluation with the patient, counseling and coordination of care as detailed above.   Future Appointments  Date Time Provider [place]  03/17/2021  3:15 PM Pura Spice, PT [facility] None  03/21/2021  1:45 PM Pura Spice, PT [facility] None  03/22/2021  3:30 PM [facility]  03/23/2021  1:45 PM Pura Spice, PT [facility] None  03/30/2021  1:45 PM Pura Spice, PT [facility] None  04/14/2021 10:30 AM  Elayne Snare, MD [facility] None  04/14/2021 11:45 AM Valora Piccolo Barnabas Lister, RD [place] [facility]  08/15/2021 11:20 AM [place] ADVISOR [facility]    Note by: Gillis Santa, MD Date: 03/17/2021; Time:  2:18 PM

## 2021-03-19 NOTE — Therapy (Signed)
[facility] [address] [place], [place], [address] Phone: [phone]   Fax:  [phone]  Physical Therapy Treatment  Patient Details  Name: Melinda Ford MRN: [id number] Date of Birth: [birth year] Referring Provider (PT): Rosette Reveal MD   Encounter Date: 03/17/2021   PT End of Session - 03/19/21 1452     Visit Number 6    Number of Visits 17    Date for PT Re-Evaluation 04/19/21    Authorization - Visit Number 6    Authorization - Number of Visits 10    PT Start Time 1510    PT Stop Time 1545    PT Time Calculation (min) 35 min    Activity Tolerance Patient limited by pain    Behavior During Therapy [facility] for tasks assessed/performed             Past Medical History:  Diagnosis Date   Allergy    Arthritis    Cataract    Diabetes mellitus without complication ([place])    Hyperlipidemia    Hypertension    Motion sickness    planes   OSA (obstructive sleep apnea) 08/23/2016   CPAP   Osteoporosis    Presence of dental prosthetic device    implants   Radiculopathy of lumbosacral region 06/22/2016   Right lateral epicondylitis 06/04/2017   Right sided sciatica 03/09/2016   Sleep apnea     Past Surgical History:  Procedure Laterality Date   ABDOMINAL HYSTERECTOMY     CATARACT EXTRACTION Right 2020   COLONOSCOPY WITH PROPOFOL N/A 10/10/2019   Procedure: COLONOSCOPY WITH PROPOFOL;  Surgeon: Lucilla Lame, MD;  Location: [place];  Service: Endoscopy;  Laterality: N/A;   ESOPHAGOGASTRODUODENOSCOPY (EGD) WITH PROPOFOL N/A 10/10/2019   Procedure: ESOPHAGOGASTRODUODENOSCOPY (EGD) WITH PROPOFOL;  Surgeon: Lucilla Lame, MD;  Location: [place];  Service: Endoscopy;  Laterality: N/A;  Diabetic - insulin pump and oral meds sleep apnea   POLYPECTOMY  10/10/2019   Procedure: POLYPECTOMY;  Surgeon: Lucilla Lame, MD;  Location: [place];  Service: Endoscopy;;    There were no vitals filed for this  visit.    Pt. had f/u with Dr. Zigmund Daniel and pain mgmt. today. See notes. MRI scheduled for next Tuesday.      Therex.:   B UBE 3 min. F/b   Reviewed UE ex.  Nautilus: 20# lat. Pull downs/ 20# tricep extension/ 30# scap. Retraction 15x2.    Reassessment of cervical/ shoulder AROM     No manual tx. Today.        PT Short Term Goals - 02/22/21 1822       PT SHORT TERM GOAL #1   Title Pt will be independent with HEP to improve cervical AROM and strength to return to pain free completion of ADL's    Baseline 11/22: initiated    Time 4    Period Weeks    Status New    Target Date 03/22/21               PT Long Term Goals - 02/22/21 1823       PT LONG TERM GOAL #1   Title Pt will improve FOTO score to target to display improvement in functional mobility and ADL completion    Baseline 11/22: next visit    Time 8    Period Weeks    Status New    Target Date 04/19/21      PT LONG TERM GOAL #  2   Title Pt will improve L grip strength to 75% of dominant hand (RUE) to return to ability to perform needed ADL's requring use of L hand    Baseline 11/22: R: 55.4 lbs, L: 27.9 lbs    Time 8    Period Weeks    Status New    Target Date 04/19/21      PT LONG TERM GOAL #3   Title Pt will report < 4/10 NPS in radicular pain with LUE use to display improvement in symptoms with ADL's    Baseline 11/22: 9-10/10 pain with use of L hand for all tasks    Time 8    Period Weeks    Status New    Target Date 04/19/21      PT LONG TERM GOAL #4   Title Pt will display full, pain free cervical AROM to indicate improvements in cervical related pain    Baseline 11/22: flex: 60, extension: 42*, R/L lat flex: 20*/35*, R/L rotation: 45*/80*    Time 8    Period Weeks    Status New    Target Date 04/19/21                   Plan - 03/19/21 1453     Clinical Impression Statement Pt. will continue with PT to focus on UE strengthening/ pain mgmt. until MRI report to  determine POC.  Tx. today focused on upright posture/ cervical position during strengthening ex.  Pt. fatigued today due to 2 MD appts. and PT limited tx. session today.  No change to HEP this weekend.  Pain in L side of neck/ UE remains persistent t/o tx. with no decrease in symptoms with position changes.    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/Moderate complexity    Clinical Decision Making Moderate    Rehab Potential Fair    PT Frequency 2x / week    PT Duration 8 weeks    PT Treatment/Interventions ADLs/Self Care Home Management;Aquatic Therapy;Cryotherapy;Electrical Stimulation;Moist Heat;Traction;Canalith Repostioning;DME Instruction;Gait training;Functional mobility training;Therapeutic activities;Therapeutic exercise;Balance training;Neuromuscular re-education;Stair training;Patient/family education;Manual techniques;Dry needling;Vestibular;Passive range of motion;Spinal Manipulations;Joint Manipulations    PT Next Visit Plan Progress HEP/ discuss MRI report.    PT Home Exercise Plan Access Code [email or web address]    Consulted and Agree with Plan of Care Patient;Family member/caregiver    Family Member Consulted Husband             Patient will benefit from skilled therapeutic intervention in order to improve the following deficits and impairments:  Pain, Decreased mobility, Postural dysfunction, Decreased range of motion, Decreased strength, Hypomobility, Decreased balance  Visit Diagnosis: Muscle weakness (generalized)  Radiculopathy, cervicothoracic region     Problem List Patient Active Problem List   Diagnosis Date Noted   Cervical radicular pain (left C5,6)  03/17/2021   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/2021   Chronic pain syndrome 03/17/2021   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/2021   Cervical paraspinal muscle spasm 02/10/2021   Gastroesophageal reflux disease 12/28/2020   Personal history of colonic polyps    Polyp of sigmoid colon    Abdominal pain, epigastric    Patient noncompliant with statin medication 09/12/2019   Type II diabetes mellitus with complication ([place]) 05/39/7673   Hyperlipidemia associated with type 2 diabetes mellitus ([place]) 05/17/2018   Primary localized osteoarthrosis of right  ankle and foot 02/22/2018   Chronic midline thoracic back pain 02/22/2018   Tinea versicolor 09/20/2016   OSA (obstructive sleep apnea) 08/23/2016   Allergic rhinitis 07/13/2016   Overweight (BMI 25.0-29.9) 10/01/2015   Vitamin D deficiency 09/18/2014   Onychomycosis 09/16/2014   Degenerative disc disease, lumbar 09/16/2014   Benign essential HTN 10/10/2010   Osteoporosis 10/10/2010   Pura Spice, PT, DPT # [phone] 03/19/2021, 3:01 PM  [facility] [facility] [address] [place], [place], [address] Phone: [phone]   Fax:  [phone]  Name: BREANN LOSANO MRN: [id number] Date of Birth: 1946-03-24

## 2021-03-21 ENCOUNTER — Ambulatory Visit: Payer: HMO | Admitting: Physical Therapy

## 2021-03-22 ENCOUNTER — Ambulatory Visit: Admission: RE | Admit: 2021-03-22 | Payer: HMO | Source: Ambulatory Visit

## 2021-03-22 ENCOUNTER — Other Ambulatory Visit: Payer: Self-pay | Admitting: Internal Medicine

## 2021-03-22 DIAGNOSIS — N760 Acute vaginitis: Secondary | ICD-10-CM

## 2021-03-22 NOTE — Telephone Encounter (Signed)
Requested medications are due for refill today.  unsure  Requested medications are on the active medications list.  no  Last refill. 07/26/2020  Future visit scheduled.   no  Notes to clinic.  Medication not assigned a protocol.    Requested Prescriptions  Pending Prescriptions Disp Refills   fluconazole (DIFLUCAN) 100 MG tablet [Pharmacy Med Name: Fluconazole 100 MG Oral Tablet] 1 tablet 0    Sig: TAKE 1 TABLET BY MOUTH ONCE DAILY FOR 1 DAY     Off-Protocol Failed - 03/22/2021 12:05 PM      Failed - Medication not assigned to a protocol, review manually.      Passed - Valid encounter within last 12 months    Recent Outpatient Visits           5 days ago Cervical spondylosis with radiculopathy   [facility] Montel Culver, MD   1 month ago Cervical spondylosis with radiculopathy   [facility] Montel Culver, MD   1 month ago Cervical spondylosis with radiculopathy   [facility] Montel Culver, MD   1 month ago Back muscle spasm   [facility] Glean Hess, MD   2 months ago Annual physical exam   [facility] Glean Hess, MD

## 2021-03-23 ENCOUNTER — Other Ambulatory Visit: Payer: Self-pay

## 2021-03-23 ENCOUNTER — Ambulatory Visit
Admission: RE | Admit: 2021-03-23 | Discharge: 2021-03-23 | Disposition: A | Discharge status: Home / Self Care | Payer: HMO | Source: Ambulatory Visit | Attending: Student in an Organized Health Care Education/Training Program | Admitting: Student in an Organized Health Care Education/Training Program

## 2021-03-23 ENCOUNTER — Ambulatory Visit: Payer: HMO | Admitting: Physical Therapy

## 2021-03-23 DIAGNOSIS — G894 Chronic pain syndrome: Secondary | ICD-10-CM | POA: Diagnosis not present

## 2021-03-23 DIAGNOSIS — M4802 Spinal stenosis, cervical region: Secondary | ICD-10-CM | POA: Diagnosis not present

## 2021-03-23 DIAGNOSIS — M542 Cervicalgia: Secondary | ICD-10-CM | POA: Diagnosis not present

## 2021-03-23 DIAGNOSIS — M47812 Spondylosis without myelopathy or radiculopathy, cervical region: Secondary | ICD-10-CM | POA: Diagnosis not present

## 2021-03-23 DIAGNOSIS — R200 Anesthesia of skin: Secondary | ICD-10-CM | POA: Diagnosis not present

## 2021-03-23 DIAGNOSIS — R202 Paresthesia of skin: Secondary | ICD-10-CM | POA: Diagnosis not present

## 2021-03-23 DIAGNOSIS — M5412 Radiculopathy, cervical region: Secondary | ICD-10-CM | POA: Insufficient documentation

## 2021-03-23 DIAGNOSIS — M2578 Osteophyte, vertebrae: Secondary | ICD-10-CM | POA: Diagnosis not present

## 2021-03-26 LAB — COMPLIANCE DRUG ANALYSIS, UR

## 2021-03-29 ENCOUNTER — Telehealth: Payer: Self-pay | Admitting: Student in an Organized Health Care Education/Training Program

## 2021-03-29 NOTE — Telephone Encounter (Signed)
Patient is asking for results of MRI.

## 2021-03-30 ENCOUNTER — Ambulatory Visit: Payer: HMO | Admitting: Physical Therapy

## 2021-03-30 ENCOUNTER — Other Ambulatory Visit: Payer: Self-pay

## 2021-03-30 DIAGNOSIS — M6281 Muscle weakness (generalized): Secondary | ICD-10-CM | POA: Diagnosis not present

## 2021-03-30 DIAGNOSIS — M5413 Radiculopathy, cervicothoracic region: Secondary | ICD-10-CM

## 2021-04-01 ENCOUNTER — Ambulatory Visit: Payer: HMO | Admitting: Dietician

## 2021-04-02 NOTE — Therapy (Signed)
[facility] [address] [place], [place], [address] Phone: [phone]   Fax:  [phone]  Physical Therapy Treatment  Patient Details  Name: Melinda Ford MRN: [id number] Date of Birth: [birth year] Referring Provider (PT): Rosette Reveal MD   Encounter Date: 03/30/2021   PT End of Session - 04/02/21 1034     Visit Number 7    Number of Visits 17    Date for PT Re-Evaluation 04/19/21    Authorization - Visit Number 7    Authorization - Number of Visits 10    PT Start Time 1324    PT Stop Time 1430    PT Time Calculation (min) 48 min    Activity Tolerance Patient limited by pain    Behavior During Therapy [facility] for tasks assessed/performed             Past Medical History:  Diagnosis Date   Allergy    Arthritis    Cataract    Diabetes mellitus without complication ([place])    Hyperlipidemia    Hypertension    Motion sickness    planes   OSA (obstructive sleep apnea) 08/23/2016   CPAP   Osteoporosis    Presence of dental prosthetic device    implants   Radiculopathy of lumbosacral region 06/22/2016   Right lateral epicondylitis 06/04/2017   Right sided sciatica 03/09/2016   Sleep apnea     Past Surgical History:  Procedure Laterality Date   ABDOMINAL HYSTERECTOMY     CATARACT EXTRACTION Right 2020   COLONOSCOPY WITH PROPOFOL N/A 10/10/2019   Procedure: COLONOSCOPY WITH PROPOFOL;  Surgeon: Lucilla Lame, MD;  Location: [place];  Service: Endoscopy;  Laterality: N/A;   ESOPHAGOGASTRODUODENOSCOPY (EGD) WITH PROPOFOL N/A 10/10/2019   Procedure: ESOPHAGOGASTRODUODENOSCOPY (EGD) WITH PROPOFOL;  Surgeon: Lucilla Lame, MD;  Location: [place];  Service: Endoscopy;  Laterality: N/A;  Diabetic - insulin pump and oral meds sleep apnea   POLYPECTOMY  10/10/2019   Procedure: POLYPECTOMY;  Surgeon: Lucilla Lame, MD;  Location: [place];  Service: Endoscopy;;    There were no vitals filed for this  visit.   Subjective Assessment - 04/02/21 1032     Subjective Pt. had MRI last week and has not spoken with Dr. Holley Raring at [facility] due to MD being out of town.  Pt. has seen MRI results on MyChart.  Pts. L UE radicular pain remains present.  Pt. reports 7/10 L sided neck/ UE pain currently .    Patient Stated Goals Decrease neck/ L UE pain symptoms.    Currently in Pain? Yes    Pain Score 7     Pain Location Neck    Pain Orientation Left    Pain Onset More than a month ago              Manual tx.:   Supine cervical traction 5x with static holds as tolerated.   Supine L/R UT and levator manual stretches to pain tolerable range.  3x each with static holds.    Supine pec stretches 3x.     STM to L/R UT and posterior deltoid in supine position.    Supine L ULTT (radial/ ulnar/ median) 2x30 sec. Each.  There.ex:  2# B shoulder press-ups/ serratus punches/ bicep curls 20x each.    Discussed HEP     PT Short Term Goals - 02/22/21 1822       PT SHORT TERM GOAL #1   Title  Pt will be independent with HEP to improve cervical AROM and strength to return to pain free completion of ADL's    Baseline 11/22: initiated    Time 4    Period Weeks    Status New    Target Date 03/22/21               PT Long Term Goals - 02/22/21 1823       PT LONG TERM GOAL #1   Title Pt will improve FOTO score to target to display improvement in functional mobility and ADL completion    Baseline 11/22: next visit    Time 8    Period Weeks    Status New    Target Date 04/19/21      PT LONG TERM GOAL #2   Title Pt will improve L grip strength to 75% of dominant hand (RUE) to return to ability to perform needed ADL's requring use of L hand    Baseline 11/22: R: 55.4 lbs, L: 27.9 lbs    Time 8    Period Weeks    Status New    Target Date 04/19/21      PT LONG TERM GOAL #3   Title Pt will report < 4/10 NPS in radicular pain with LUE use to display improvement in symptoms with  ADL's    Baseline 11/22: 9-10/10 pain with use of L hand for all tasks    Time 8    Period Weeks    Status New    Target Date 04/19/21      PT LONG TERM GOAL #4   Title Pt will display full, pain free cervical AROM to indicate improvements in cervical related pain    Baseline 11/22: flex: 60, extension: 42*, R/L lat flex: 20*/35*, R/L rotation: 45*/80*    Time 8    Period Weeks    Status New    Target Date 04/19/21                   Plan - 04/02/21 1034     Clinical Impression Statement PT reassessed L ULTT and cervical ROM/ stretches in supine position.  PT discussed aspects of MRI and pt. hoping to hear from MD next week to discuss POC.  PT will remain on hold pending MD f/u next week.  Pt. understands current HEP for cervical/ L UE stretches.  No reduction in pain with nerve glides/ stretches.    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/Moderate complexity    Clinical Decision Making Moderate    Rehab Potential Fair    PT Frequency 2x / week    PT Duration 8 weeks    PT Treatment/Interventions ADLs/Self Care Home Management;Aquatic Therapy;Cryotherapy;Electrical Stimulation;Moist Heat;Traction;Canalith Repostioning;DME Instruction;Gait training;Functional mobility training;Therapeutic activities;Therapeutic exercise;Balance training;Neuromuscular re-education;Stair training;Patient/family education;Manual techniques;Dry needling;Vestibular;Passive range of motion;Spinal Manipulations;Joint Manipulations    PT Next Visit Plan Pt. will contact PT after MD f/u to discuss POC.    PT Home Exercise Plan Access Code [email or web address]    Consulted and Agree with Plan of Care  Patient;Family member/caregiver    Family Member Consulted Husband             Patient will benefit from skilled therapeutic intervention in order to improve the following deficits and impairments:  Pain, Decreased mobility, Postural dysfunction, Decreased range of motion, Decreased strength,  Hypomobility, Decreased balance  Visit Diagnosis: Muscle weakness (generalized)  Radiculopathy, cervicothoracic region     Problem List 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   Chronic pain syndrome 03/17/2021   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/2021   Cervical paraspinal muscle spasm 02/10/2021   Gastroesophageal reflux disease 12/28/2020   Personal history of colonic polyps    Polyp of sigmoid colon    Abdominal pain, epigastric    Patient noncompliant with statin medication 09/12/2019   Type II diabetes mellitus with complication ([place]) 79/89/2119   Hyperlipidemia associated with type 2 diabetes mellitus ([place]) 05/17/2018   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 02/22/2018   Chronic midline thoracic back pain 02/22/2018   Tinea versicolor 09/20/2016   OSA (obstructive sleep apnea) 08/23/2016   Allergic rhinitis 07/13/2016   Overweight (BMI 25.0-29.9) 10/01/2015   Vitamin D deficiency 09/18/2014   Onychomycosis 09/16/2014   Degenerative disc disease, lumbar 09/16/2014   Benign essential HTN 10/10/2010   Osteoporosis 10/10/2010   Pura Spice, PT, DPT # [phone] 04/02/2021, 10:38 AM  [place] [address] [place], [place], [address] Phone: [phone]   Fax:  [phone]  Name: Melinda Ford MRN: [id number] Date of Birth: [birth year]

## 2021-04-03 ENCOUNTER — Other Ambulatory Visit: Payer: Self-pay | Admitting: Internal Medicine

## 2021-04-05 ENCOUNTER — Telehealth: Payer: Self-pay | Admitting: *Deleted

## 2021-04-05 NOTE — Telephone Encounter (Signed)
Requested Prescriptions  Pending Prescriptions Disp Refills   Continuous Blood Gluc Sensor (FREESTYLE LIBRE 2 SENSOR) MISC [Pharmacy Med Name: FREESTYLE LIBRE 2 SENSOR KIT] 2 each 2    Sig: APPLY 1 SENSOR TO BODY EVERY 14 DAYS TO MONITOR BLOOD SUGAR     Endocrinology: Diabetes - Testing Supplies Passed - 04/03/2021 10:12 AM      Passed - Valid encounter within last 12 months    Recent Outpatient Visits          2 weeks ago Cervical spondylosis with radiculopathy   [facility] Montel Culver, MD   1 month ago Cervical spondylosis with radiculopathy   [facility] Montel Culver, MD   1 month ago Cervical spondylosis with radiculopathy   [facility] Montel Culver, MD   1 month ago Back muscle spasm   [facility] Glean Hess, MD   3 months ago Annual physical exam   [facility] Glean Hess, MD

## 2021-04-06 ENCOUNTER — Telehealth: Payer: Self-pay

## 2021-04-06 NOTE — Telephone Encounter (Signed)
Called patient to go over pre virtual appointment questions.  Unable to leave a message because the mailbox was full.

## 2021-04-07 ENCOUNTER — Encounter: Payer: Self-pay | Admitting: Student in an Organized Health Care Education/Training Program

## 2021-04-07 ENCOUNTER — Other Ambulatory Visit: Payer: Self-pay

## 2021-04-07 ENCOUNTER — Ambulatory Visit
Payer: HMO | Attending: Student in an Organized Health Care Education/Training Program | Admitting: Student in an Organized Health Care Education/Training Program

## 2021-04-07 DIAGNOSIS — M4802 Spinal stenosis, cervical region: Secondary | ICD-10-CM

## 2021-04-07 DIAGNOSIS — M5412 Radiculopathy, cervical region: Secondary | ICD-10-CM

## 2021-04-07 DIAGNOSIS — G894 Chronic pain syndrome: Secondary | ICD-10-CM | POA: Diagnosis not present

## 2021-04-07 NOTE — Progress Notes (Signed)
Patient: Melinda Ford  Service Category: E/M  Provider: Gillis Santa, MD  DOB: [birth year]  DOS: 04/07/2021  Location: Office  MRN: [id number]  Setting: Ambulatory outpatient  Referring Provider: Glean Hess, MD  Type: Established Patient  Specialty: Interventional Pain Management  PCP: Glean Hess, MD  Location: Remote location  Delivery: TeleHealth     Virtual Encounter - Pain Management PROVIDER NOTE: Information contained herein reflects review and annotations entered in association with encounter. Interpretation of such information and data should be left to medically-trained personnel. Information provided to patient can be located elsewhere in the medical record under "Patient Instructions". Document created using STT-dictation technology, any transcriptional errors that may result from process are unintentional.    Contact & Pharmacy Preferred: [phone] Home: [phone] (home) Mobile: [phone] (mobile) E-mail: [email or web address]  [place], [place] - [place] [address] Phone: [phone] Fax: [phone]  [place] [facility]) - [place], [address] [place] [address] Phone: [phone] Fax: [place] [id number] [place], [place] - [place] [address] Phone: [phone] Fax: [place], [place] [address] Phone: [phone] Fax: [place] [address], [place] [address] Phone: [phone] Fax: [phone]   Pre-screening  Ms. Kraker offered "in-person" vs "virtual" encounter. She indicated preferring virtual for this encounter.   Reason COVID-19*   Social distancing based on CDC and AMA recommendations.   I contacted  Melinda Ford on 04/07/2021 via telephone.      I clearly identified myself as Gillis Santa, MD. I verified that I was speaking with the correct person using two identifiers (Name: Melinda Ford, and date of birth: 1945-06-19).  Consent I sought verbal advanced consent from Melinda Ford for virtual visit interactions. I informed Melinda Ford of possible security and privacy concerns, risks, and limitations associated with providing "not-in-person" medical evaluation and management services. I also informed Melinda Ford of the availability of "in-person" appointments. Finally, I informed her that there would be a charge for the virtual visit and that she could be  personally, fully or partially, financially responsible for it. Melinda Ford expressed understanding and agreed to proceed.   Historic Elements   Melinda Ford is a 76 y.o. year old, female patient evaluated today after our last contact on 03/29/2021. Melinda Ford  has a past medical history of Allergy, Arthritis, Cataract, Diabetes mellitus without complication ([place]), Hyperlipidemia, Hypertension, Motion sickness, OSA (obstructive sleep apnea) (08/23/2016), Osteoporosis, Presence of dental prosthetic device, Radiculopathy of lumbosacral region (06/22/2016), Right lateral epicondylitis (06/04/2017), Right sided sciatica (03/09/2016), and Sleep apnea. She also  has a past surgical history that includes Abdominal hysterectomy; Cataract extraction (Right, 2020); Colonoscopy with propofol (N/A, 10/10/2019); Esophagogastroduodenoscopy (egd) with propofol (N/A, 10/10/2019); and polypectomy (10/10/2019). Melinda Ford has a current medication list which includes the following prescription(s): acetaminophen, amlodipine, calcium carbonate-vit d-min, vitamin d, freestyle libre 14 day reader, freestyle libre 2 sensor, gabapentin, ginkgo biloba, hydrocodone-acetaminophen, insulin aspart, v-go 20, losartan, NON FORMULARY, omeprazole, pravastatin, ozempic (0.25 or 0.5  mg/dose), and tizanidine. She  reports that she has never smoked. She has never used smokeless tobacco. She reports that she  does not currently use alcohol after a past usage of about 1.0 standard drink per week. She reports that she does not use drugs. Melinda Ford is allergic to ace inhibitors, wheat bran, and gramineae pollens.   HPI  Today, she is being contacted for  discuss C-MRI results  Laboratory Chemistry Profile   Renal Lab Results  Component Value Date   BUN 25 (H) 10/01/2020   CREATININE 0.85 10/01/2020   BCR 35 (H) 09/10/2015   GFR 66.73 10/01/2020   GFRAA 113 09/10/2015   GFRNONAA 98 09/10/2015    Hepatic Lab Results  Component Value Date   AST 24 10/01/2020   ALT 34 10/01/2020   ALBUMIN 4.0 10/01/2020   ALKPHOS 76 10/01/2020   LIPASE 21 05/17/2018    Electrolytes Lab Results  Component Value Date   NA 137 10/01/2020   K 4.4 10/01/2020   CL 105 10/01/2020   CALCIUM 10.1 10/01/2020    Bone Lab Results  Component Value Date   VD25OH 34.0 12/28/2020    Inflammation (CRP: Acute Phase) (ESR: Chronic Phase) Lab Results  Component Value Date   ESRSEDRATE 12 01/15/2015         Note: Above Lab results reviewed.  Imaging  MR CERVICAL SPINE WO CONTRAST CLINICAL DATA:  Chronic neck pain and cervicalgia. Left arm pain since November 2022 with numbness and tingling in the left arm.  EXAM: MRI CERVICAL SPINE WITHOUT CONTRAST  TECHNIQUE: Multiplanar, multisequence MR imaging of the cervical spine was performed. No intravenous contrast was administered.  COMPARISON:  Radiographs 02/10/2021  FINDINGS: Despite efforts by the technologist and patient, motion artifact is present on today's exam and could not be eliminated. This reduces exam sensitivity and specificity.  Alignment: 2 mm degenerative retrolisthesis at C4-5. Mild reversal of the normal cervical lordosis centered at the C4 level.  Vertebrae: Disc desiccation is present throughout the cervical  spine with loss of disc height particularly at C4-5 and C5-6. Degenerative endplate findings primarily at C4-5 and C5-6.  Cord: Equivocal accentuated central cord signal at the C4-5 level on inversion recovery weighted image 7 series 2, although not confirmed on the dedicated T2 weighted images.  Posterior Fossa, vertebral arteries, paraspinal tissues: Unremarkable  Disc levels:  C2-3: Borderline central narrowing of the thecal sac due to a small central disc protrusion.  C3-4: Moderate central narrowing of the thecal sac due to short pedicles, disc bulge, and intervertebral spurring. Left vertebral artery loop extends substantially into the left neural foramen on image 7 of series 5, this can occasionally cause impingement related symptoms.  C4-5: Prominent right and moderate to prominent left foraminal stenosis along with moderate central narrowing of the thecal sac due to subluxation, intervertebral spurring, and disc bulge along with uncinate spurring.  C5-6: Moderate bilateral foraminal stenosis and moderate central narrowing of the thecal sac due to disc osteophyte complex, uncinate spurring, and facet arthropathy.  C6-7: Mild bilateral foraminal stenosis and mild central narrowing of the thecal sac due to disc bulge and uncinate spurring.  C7-T1: No impingement.  Mild disc bulge.  IMPRESSION: 1. Cervical spondylosis and degenerative disc disease, causing prominent impingement at C4-5; moderate impingement at C3-4 and C5-6; and mild impingement at C6-7. 2. 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 This can occasionally cause impingement related symptoms. 3. Equivocal accentuated central cord signal at the C4-5 level on sagittal inversion recovery weighted images but not confirmed  on the T2 weighted images, difficult to exclude subtle central myelomalacia.  Electronically  Signed   By: Van Clines M.D.   On: 03/24/2021 08:31  Assessment  The primary encounter diagnosis was Cervical radicular pain (left C5,6). Diagnoses of Foraminal stenosis of cervical region and Chronic pain syndrome were also pertinent to this visit.  Plan of Care   Patient states that she is doing better in regards to her pain with gabapentin and tizanidine.  She is able to function more comfortably and perform ADLs.  We reviewed her cervical spine MRI today which shows cervical spondylosis with impingement at C4-C5, C3-C4.  She also has an accentuated central cord signal at C4-C5 that could be contributing to her radicular symptoms.  Given that her overall pain is better, patient states that she would like to hold off on a cervical epidural steroid injection.  If symptoms worsen or if medications become less effective, have instructed her to contact us.  Otherwise continue gabapentin and tizanidine as prescribed.  Follow-up in 6 months or earlier if patient would like to pursue cervical ESI.    Follow-up plan:   Return in about 6 months (around 10/05/2021) for F/U MM & discuss C-ESI.    Recent Visits Date Type Provider Dept  03/17/21 Office Visit Gillis Santa, MD [facility]  Showing recent visits within past 90 days and meeting all other requirements Today's Visits Date Type Provider Dept  04/07/21 Office Visit Gillis Santa, MD [facility]  Showing today's visits and meeting all other requirements Future Appointments No visits were found meeting these conditions. Showing future appointments within next 90 days and meeting all other requirements  I discussed the assessment and treatment plan with the patient. The patient was provided an opportunity to ask questions and all were answered. The patient agreed with the plan and demonstrated an understanding of the instructions.  Patient advised to call back or seek an in-person evaluation if the symptoms or  condition worsens.  Duration of encounter: 55mnutes.  Note by: BGillis Santa MD Date: 04/07/2021; Time: 10:42 AM

## 2021-04-11 ENCOUNTER — Telehealth: Payer: Self-pay | Admitting: Endocrinology

## 2021-04-11 DIAGNOSIS — Z794 Long term (current) use of insulin: Secondary | ICD-10-CM

## 2021-04-11 DIAGNOSIS — E1165 Type 2 diabetes mellitus with hyperglycemia: Secondary | ICD-10-CM

## 2021-04-11 NOTE — Telephone Encounter (Signed)
MEDICATION: insulin aspart (NOVOLOG) 100 UNIT/ML injection  PHARMACY:  [facility], [place] Phone:  [phone]  Fax:  [phone]     HAS THE PATIENT CONTACTED THEIR PHARMACY?  Yes  IS THIS A 90 DAY SUPPLY : yes  IS PATIENT OUT OF MEDICATION: 1 day  IF NOT; HOW MUCH IS LEFT:   LAST APPOINTMENT DATE: @10 /07/2020  NEXT APPOINTMENT DATE:@1 /03/2022  DO WE HAVE YOUR PERMISSION TO LEAVE A DETAILED MESSAGE?:  OTHER COMMENTS: Per patient insurance coverage they will only cover Novolog. Please fill prescription and notify patient when complete [phone]. Patient request fast acting insulin.   **Let patient know to contact pharmacy at the end of the day to make sure medication is ready. **  ** Please notify patient to allow 48-72 hours to process**  **Encourage patient to contact the pharmacy for refills or they can request refills through [facility]**

## 2021-04-12 MED ORDER — INSULIN ASPART 100 UNIT/ML IJ SOLN: INTRAMUSCULAR | 2 refills | Status: DC

## 2021-04-12 NOTE — Telephone Encounter (Signed)
Rx sent to preferred pharmacy.

## 2021-04-14 ENCOUNTER — Encounter: Payer: Self-pay | Admitting: Endocrinology

## 2021-04-14 ENCOUNTER — Other Ambulatory Visit: Payer: Self-pay

## 2021-04-14 ENCOUNTER — Encounter: Payer: HMO | Attending: Endocrinology | Admitting: Dietician

## 2021-04-14 ENCOUNTER — Ambulatory Visit (INDEPENDENT_AMBULATORY_CARE_PROVIDER_SITE_OTHER): Payer: HMO | Admitting: Endocrinology

## 2021-04-14 VITALS — BP 148/82 | HR 68 | Ht 62.0 in | Wt 167.8 lb

## 2021-04-14 DIAGNOSIS — E7800 Pure hypercholesterolemia, unspecified: Secondary | ICD-10-CM

## 2021-04-14 DIAGNOSIS — E1165 Type 2 diabetes mellitus with hyperglycemia: Secondary | ICD-10-CM

## 2021-04-14 DIAGNOSIS — Z794 Long term (current) use of insulin: Secondary | ICD-10-CM

## 2021-04-14 DIAGNOSIS — E118 Type 2 diabetes mellitus with unspecified complications: Secondary | ICD-10-CM | POA: Insufficient documentation

## 2021-04-14 LAB — POCT GLYCOSYLATED HEMOGLOBIN (HGB A1C): Hemoglobin A1C: 8.5 % — AB (ref 4.0–5.6)

## 2021-04-14 MED ORDER — INSULIN ASPART 100 UNIT/ML IJ SOLN: INTRAMUSCULAR | 5 refills | Status: DC

## 2021-04-14 MED ORDER — INSULIN ASPART 100 UNIT/ML IJ SOLN: INTRAMUSCULAR | 2 refills | Status: DC

## 2021-04-14 MED ORDER — OZEMPIC (0.25 OR 0.5 MG/DOSE) 2 MG/1.5ML ~~LOC~~ SOPN: 0.5000 mg | PEN_INJECTOR | SUBCUTANEOUS | 2 refills | Status: DC

## 2021-04-14 NOTE — Progress Notes (Signed)
Diabetes Self-Management Education  Visit Type: Follow-up  Appt. Start Time: 1135 Appt. End Time: 0814  04/15/2021  [name], identified by name and date of birth, is a 76 y.o. female with a diagnosis of Diabetes:  .   ASSESSMENT Patient is here today with her husband.  She was last seen by myself 02/10/2021.  She reports increased arm pain (radiating) which has made it difficult to focus on other things.  She was given steroid injections and PT.  She has not been exercising. Hunger was not increased recently and does not feel that she was gorging like she was. She is to begin Ozempic.  History includes Type 2 diabete,s HTN, HLD, OSA - "should wear c-pap" Medications include Novolg via a Vgo 20 (4 clicks per meal and forgets at the start of the meal at times).  She is not taking Ozempic due to cost Labs include:  A1C 8.5% 04/14/2021, 7.5 01/04/2021, 7.6% 10/01/2020, vitamin D 34 12/28/2020, GFR 66 10/01/20, Cholesterol 211, HDL 87, LDL 111, Triglycerides 63 (10/01/2020) CGM:  Libre2 and sensor reading currently 173  Weight hx: 167 lbs 04/14/2021 161 lbs 02/10/2021 166 lbs 01/04/2021 150 lbs prior to covid   Patient lives with her husband.  Their son is there currently as well.  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    Diabetes Self-Management Education - 04/15/21 1700       Visit Information   Visit Type Follow-up      Pre-Education Assessment   Patient understands the diabetes disease and treatment process. Demonstrates understanding / competency    Patient understands incorporating nutritional management into lifestyle. Needs Review    Patient undertands incorporating physical activity into lifestyle. Demonstrates understanding / competency    Patient understands using medications safely. Demonstrates understanding / competency    Patient understands monitoring blood glucose, interpreting and using results  Demonstrates understanding / competency    Patient understands prevention, detection, and treatment of acute complications. Demonstrates understanding / competency    Patient understands prevention, detection, and treatment of chronic complications. Demonstrates understanding / competency    Patient understands how to develop strategies to address psychosocial issues. Demonstrates understanding / competency    Patient understands how to develop strategies to promote health/change behavior. Needs Review      Complications   Last HgB A1C per patient/outside source 8.5 %   04/14/2021   How often do you check your blood sugar? > 4 times/day    Number of hypoglycemic episodes per month 2    Can you tell when your blood sugar is low? Yes      Dietary Intake   Breakfast grits, shrimp scampy    Lunch skips OR leftovers or Hot dog    Snack (afternoon) NABS    Dinner short ribs, rice, green beans OR boiled chicken, potatoes, greens beans    Snack (evening) occasional ice cream    Beverage(s) water, flavored water, diet or zero sprite      Exercise   Exercise Type ADL's      Patient Education   Previous Diabetes Education Yes (please comment)   10/22   Nutrition management  Meal options for control of blood glucose level and chronic complications.;Reviewed blood glucose goals for pre and post meals and how to evaluate the patients' food intake on their blood glucose level.    Physical activity and exercise  Role of exercise on diabetes management, blood  pressure control and cardiac health.    Medications Reviewed patients medication for diabetes, action, purpose, timing of dose and side effects.    Monitoring Taught/evaluated SMBG meter.;Identified appropriate SMBG and/or A1C goals.      Individualized Goals (developed by patient)   Nutrition General guidelines for healthy choices and portions discussed    Physical Activity Exercise 3-5 times per week;30 minutes per day    Medications take my  medication as prescribed    Monitoring  test my blood glucose as discussed    Reducing Risk examine blood glucose patterns;treat hypoglycemia with 15 grams of carbs if blood glucose less than 70mg /dL;increase portions of healthy fats      Patient Self-Evaluation of Goals - Patient rates self as meeting previously set goals (% of time)   Nutrition >75%    Physical Activity < 25%    Medications 50 - 75 %    Monitoring >75%    Problem Solving >75%    Reducing Risk 50 - 75 %    Health Coping >75%      Post-Education Assessment   Patient understands the diabetes disease and treatment process. Demonstrates understanding / competency    Patient understands incorporating nutritional management into lifestyle. Needs Review    Patient undertands incorporating physical activity into lifestyle. Demonstrates understanding / competency    Patient understands using medications safely. Demonstrates understanding / competency    Patient understands monitoring blood glucose, interpreting and using results Demonstrates understanding / competency    Patient understands prevention, detection, and treatment of acute complications. Demonstrates understanding / competency    Patient understands prevention, detection, and treatment of chronic complications. Demonstrates understanding / competency    Patient understands how to develop strategies to address psychosocial issues. Demonstrates understanding / competency    Patient understands how to develop strategies to promote health/change behavior. Needs Review      Outcomes   Expected Outcomes Demonstrated interest in learning. Expect positive outcomes    Future DMSE 2 months    Program Status Not Completed      Subsequent Visit   Since your last visit have you experienced any weight changes? Gain    Weight Gain (lbs) 6             Individualized Plan for Diabetes Self-Management Training:   Learning Objective:  Patient will have a greater  understanding of diabetes self-management. Patient education plan is to attend individual and/or group sessions per assessed needs and concerns.   Plan:   Patient Instructions   Patient Instructions  Do your V-go clicks 15 minutes before you eat.   Consider exercise to 5 days per week  Consider some simple meal planning.  Salad or lower fat option most often for lunch   Move dinner up (generally before 6). Small snack at night if blood glucose is in the lower range.  Pay attention to nutrition quality.   Increase your vegetables.   Monitor your blood sugar before and after your meals to see the effect of food.             A rise of 40-60 points is normal but after salad or if you walked after dinner it may not rise much.   Listen to your body.  When are you satisfied? Before snacking, ask, "Am I hungry or eating for another reason?"  Expected Outcomes:  Demonstrated interest in learning. Expect positive outcomes  Education material provided: My Plate  If problems or questions, patient to contact team  via:  Phone  Future DSME appointment: 2 months

## 2021-04-14 NOTE — Progress Notes (Signed)
Patient ID: Melinda Ford, female   DOB: 1946-01-20, 76 y.o.   MRN: [id number]            Reason for Appointment: Follow-up for Type 2 Diabetes   History of Present Illness:          Date of diagnosis of type 2 diabetes mellitus: ?  2004        Background history:   She previously had been on metformin and also Avandia initially Detailed records of her care are not available prior to 2013 or so She was tried on Januvia in 2016 but this was not effective and this was stopped in 08/2015 She was started on insulin in 2/16 with small doses of Lantus insulin Her A1c has been consistently over 8% since about 2013  Recent history:   INSULIN regimen is:   V-go 20 unit pump since 2/77/41, boluses 0-4 clicks with meals     Non-insulin hypoglycemic drugs the patient is taking are: None  Her A1c is 8.5 compared to 7.5  Current management, blood sugar patterns and problems identified: She thinks her blood sugars are poorly because of dealing with her neck pain and radiculopathy  Although she has not had any steroids for some time she does she did get prednisone causing high sugars However she is still not checking her blood sugars consistently and only about half the time  She thinks that because of focusing on the pain she has not remembered to bolus  She appears to have consistently high readings after meals with the highest reading later in the morning but occasionally in the early afternoon also  She has taken up to 4 clicks to cover her meals even though she had previously taken only 2 clicks  She was prescribed OZEMPIC on the last visit but she did not start taking it because of cost  Again generally is eating oatmeal mostly for breakfast  She may have some snacks between meals also more at night  Currently not exercising Weight has gone up in the last month        Side effects from medications have been: nausea and diarrhea from 1000 MG metformin  Compliance with the medical  regimen: fair  Last 2 weeks data from freestyle [place] version 2 shows the following interpretation and statistics  Summary of patterns:  She has fairly consistently high blood sugars overall except early morning when blood sugar may be occasionally below 70  POSTPRANDIAL readings on an average are in the low 200 range both after her first meal late morning and around midnight  Blood sugars show variability from day-to-day and blood sugars are in the last few days not showing any consistent patterns of hyperglycemia  She usually has a fairly consistent rise in blood sugars daily between 9 AM and 12 noon especially after breakfast No hypoglycemia except on 1/9 her blood sugars were below 70 between 6-8 AM Overnight blood sugars are generally starting of high and coming down to a variable extent by the morning . CGM use % of time 54  2-week average/GV 192/32  Time in range     39   %  % Time Above 180 42+17  % Time above 250   % Time Below 70 2     PRE-MEAL Fasting Lunch Dinner 12 AM Overall  Glucose range:       Averages: 141   243    POST-MEAL PC Breakfast PC Lunch PC Dinner  Glucose range:  Averages: 231  200      Previously   TIME in range = 49% with 4% low sugars and 47% over 180   PRE-MEAL Fasting Lunch Dinner Bedtime Overall  Glucose range:       Mean/GV 114  188 199 180+/-40   POST-MEAL PC Breakfast PC Lunch PC Dinner  Glucose range:     Mean/median: 214  240    Self-care:   Meal times are:  Breakfast is at 10 AM, dinner 8, lunch 2-3 pm  Typical meal intake: Breakfast is toast, oatmeal, sometimes bacon, milk   Lunch: Sometimes hot dogs, fish sandwich Evening meal is a protein like chicken, steak, pork chop, corn, greens.           Dietician visit, most recent: 2013 CDE visit: 12/2015   Weight history:  Wt Readings from Last 3 Encounters:  04/14/21 167 lb 12.8 oz (76.1 kg)  03/17/21 160 lb (72.6 kg)  03/17/21 160 lb (72.6 kg)    Glycemic  control:   Lab Results  Component Value Date   HGBA1C 8.5 (A) 04/14/2021   HGBA1C 7.5 (A) 01/04/2021   HGBA1C 7.6 (A) 10/01/2020   Lab Results  Component Value Date   MICROALBUR 0.8 10/01/2020   LDLCALC 111 (H) 10/01/2020   CREATININE 0.85 10/01/2020   Lab Results  Component Value Date   MICRALBCREAT 0.7 10/01/2020    Other active problems: See review of systems   Allergies as of 04/14/2021       Reactions   Ace Inhibitors Cough   Wheat Bran Hives   Gramineae Pollens         Medication List        Accurate as of April 14, 2021  1:03 PM. If you have any questions, ask your nurse or doctor.          STOP taking these medications    HYDROcodone-acetaminophen 5-325 MG tablet Commonly known as: NORCO/VICODIN Stopped by: Elayne Snare, MD       TAKE these medications    acetaminophen 500 MG tablet Commonly known as: TYLENOL Take 1,000 mg by mouth 2 (two) times daily. As needed   amLODipine 10 MG tablet Commonly known as: NORVASC Take 1 tablet (10 mg total) by mouth daily.   CALCIUM 1200 PO Take 2 each by mouth daily.   FreeStyle Libre 14 Day Reader Kerrin Mo See admin instructions.   FreeStyle Libre 2 Sensor Misc APPLY 1 SENSOR TO BODY EVERY 14 DAYS TO MONITOR BLOOD SUGAR   gabapentin 100 MG capsule Commonly known as: Neurontin Take 1-3 capsules (100-300 mg total) by mouth at bedtime. Follow written titration schedule.   GINKGO BILOBA COMPLEX PO Take 1 capsule by mouth daily.   insulin aspart 100 UNIT/ML injection Commonly known as: novoLOG USE A MAX OF 90 UNITS DAILY VIA INSULIN PUMP What changed: additional instructions Changed by: Elayne Snare, MD   losartan 100 MG tablet Commonly known as: COZAAR Take 1 tablet by mouth once daily   NON FORMULARY CPAP @@ 9 cm H2O nightly   omeprazole 20 MG capsule Commonly known as: PRILOSEC Take 1 capsule (20 mg total) by mouth daily.   Ozempic (0.25 or 0.5 MG/DOSE) 2 MG/1.5ML Sopn Generic drug:  Semaglutide(0.25 or 0.5MG/DOS) Inject 0.5 mg into the skin once a week.   pravastatin 20 MG tablet Commonly known as: PRAVACHOL Take 1 tablet by mouth once daily What changed: when to take this   tiZANidine 4 MG tablet Commonly known as: Zanaflex  Take 1 tablet (4 mg total) by mouth every 12 (twelve) hours as needed for muscle spasms.   V-Go 20 Kit 1  ONCE DAILY   Vitamin D 50 MCG (2000 UT) Caps Take 1 capsule (2,000 Units total) by mouth daily.        Allergies:  Allergies  Allergen Reactions   Ace Inhibitors Cough   Wheat Bran Hives   Gramineae Pollens     Past Medical History:  Diagnosis Date   Allergy    Arthritis    Cataract    Diabetes mellitus without complication (HCC)    Hyperlipidemia    Hypertension    Motion sickness    planes   OSA (obstructive sleep apnea) 08/23/2016   CPAP   Osteoporosis    Presence of dental prosthetic device    implants   Radiculopathy of lumbosacral region 06/22/2016   Right lateral epicondylitis 06/04/2017   Right sided sciatica 03/09/2016   Sleep apnea     Past Surgical History:  Procedure Laterality Date   ABDOMINAL HYSTERECTOMY     CATARACT EXTRACTION Right 2020   COLONOSCOPY WITH PROPOFOL N/A 10/10/2019   Procedure: COLONOSCOPY WITH PROPOFOL;  Surgeon: Lucilla Lame, MD;  Location: [place];  Service: Endoscopy;  Laterality: N/A;   ESOPHAGOGASTRODUODENOSCOPY (EGD) WITH PROPOFOL N/A 10/10/2019   Procedure: ESOPHAGOGASTRODUODENOSCOPY (EGD) WITH PROPOFOL;  Surgeon: Lucilla Lame, MD;  Location: [place];  Service: Endoscopy;  Laterality: N/A;  Diabetic - insulin pump and oral meds sleep apnea   POLYPECTOMY  10/10/2019   Procedure: POLYPECTOMY;  Surgeon: Lucilla Lame, MD;  Location: [place];  Service: Endoscopy;;    Family History  Problem Relation Age of Onset   Breast cancer Other 10   Alcohol abuse Father    Mental illness Father    Stroke Father    Diabetes Sister    Asthma Brother     Diabetes Brother    Diabetes Maternal Aunt    Heart disease Maternal Aunt    Diabetes Maternal Uncle    Early death Maternal Uncle    Breast cancer Cousin 51       mat cousin    Social History:  reports that she has never smoked. She has never used smokeless tobacco. She reports that she does not currently use alcohol after a past usage of about 1.0 standard drink per week. She reports that she does not use drugs.   Review of Systems  Lipid history: She is currently not taking pravastatin as prescribed using 20 mg, previously tolerating this 3 days a week without increased muscle aches She says that she has not been remembering to take this lately  Last LDL:  Lab Results  Component Value Date   CHOL 211 (H) 10/01/2020   CHOL 180 02/17/2020   CHOL 202 (H) 10/22/2019   Lab Results  Component Value Date   HDL 87.00 10/01/2020   HDL 74.70 02/17/2020   HDL 80.10 10/22/2019   Lab Results  Component Value Date   LDLCALC 111 (H) 10/01/2020   LDLCALC 92 02/17/2020   LDLCALC 107 (H) 10/22/2019   Lab Results  Component Value Date   TRIG 63.0 10/01/2020   TRIG 65.0 02/17/2020   TRIG 72.0 10/22/2019   Lab Results  Component Value Date   CHOLHDL 2 10/01/2020   CHOLHDL 2 02/17/2020   CHOLHDL 3 10/22/2019   No results found for: LDLDIRECT          Hypertension: on treatment for several  years, on amlodipine and losartan   Has been followed by PCP  BP Readings from Last 3 Encounters:  04/14/21 (!) 148/82  03/17/21 (!) 151/85  03/17/21 122/82     Most recent foot exam: 12/2019  OSTEOPOROSIS: Followed by PCP, lowest T score was- 2.6 at the spine compared to -3.0 before She was previously taking alendronate Also has had longstanding vitamin D deficiency  Has been taking 4000 units of vitamin D3   Lab Results  Component Value Date   VD25OH 34.0 12/28/2020   VD25OH 40.70 02/17/2020   VD25OH 28.44 (L) 10/22/2019   VD25OH 29.1 (L) 04/20/2017   VD25OH 24.92 (L)  05/18/2016   VD25OH 31.3 07/28/2015   VD25OH 26.9 (L) 03/17/2015   VD25OH 21.5 (L) 09/17/2014      Physical Examination:  BP (!) 148/82    Pulse 68    Ht 5' 2"  (1.575 m)    Wt 167 lb 12.8 oz (76.1 kg)    SpO2 97%    BMI 30.69 kg/m        ASSESSMENT:  Diabetes type 2 on insulin  See history of present illness for detailed discussion of current diabetes management, blood sugar patterns and problems identified  She is currently on insulin only with the V-go pump  A1c is higher than usual at 8.5  Even though she is not monitoring consistently her freestyle Elenor Legato shows an average of 192 recently  Her main difficulty is again postprandial hyperglycemia related to late Nexletol inadequate boluses Also not taking Ozempic as directed As usual her blood sugars are the highest late at night from snacks Not able to exercise  HYPERTENSION: Has variable control, continue follow-up with PCP  LIPIDS: She has not taken pravastatin regularly and will need to check her labs on the next visit    PLAN:   She will check her blood sugars more consistently 3-4 times a day Discussed the need to control her readings after meals and this can be only achieved with taking her bolus clicks before standing.  Will benefit from keeping insulin doses in writing.  She will need to make note of the missed doses Also add click if the blood sugar before eating will be higher than 180 She will likely need 4-6 clicks with each meal especially breakfast if blood sugars are still going up She will try Ozempic as directed and showed her how to use the injection device and discussed benefits again If she has lower readings especially overnight she will let us know and adjust her insulin accordingly Restart regular exercise Make sure she has protein with her oatmeal in the mornings Start taking pravastatin regularly     Patient Instructions  CLICKS AT [place] blood sugars on waking up 7  days a week  Also check blood sugars about 2 hours after meals and do this after different meals by rotation  Recommended blood sugar levels on waking up are 90-130 and about 2 hours after meal is <180  Please bring your blood sugar monitor to each visit, thank you  Start OZEMPIC injections by dialing 0.25 mg on the pen as shown once weekly on the same day of the week.   You may inject in the sides of the stomach, outer thigh or arm as indicated in the brochure given. If you have any difficulties using the pen see the video at [email or web address].[email or web address]  You will feel fullness of the stomach with starting the medication and should try  to keep the portions at meals small.  You may experience nausea in the first few days which usually gets better over time    After 4 weeks increase the dose to 0.5 mg weekly  If you have any questions or persistent side effects please call the office   You may also talk to a nurse educator with [employer] at [phone] Useful website: [email or web address]          Elayne Snare 04/14/2021, 1:03 PM   Note: This office note was prepared with Dragon voice recognition system technology. Any transcriptional errors that result from this process are unintentional.

## 2021-04-14 NOTE — Patient Instructions (Signed)
CLICKS AT TIME OF [facility] MEAL  Check blood sugars on waking up 7 days a week  Also check blood sugars about 2 hours after meals and do this after different meals by rotation  Recommended blood sugar levels on waking up are 90-130 and about 2 hours after meal is <180  Please bring your blood sugar monitor to each visit, thank you  Start OZEMPIC injections by dialing 0.25 mg on the pen as shown once weekly on the same day of the week.   You may inject in the sides of the stomach, outer thigh or arm as indicated in the brochure given. If you have any difficulties using the pen see the video at [email or web address].[email or web address]  You will feel fullness of the stomach with starting the medication and should try to keep the portions at meals small.  You may experience nausea in the first few days which usually gets better over time    After 4 weeks increase the dose to 0.5 mg weekly  If you have any questions or persistent side effects please call the office   You may also talk to a nurse educator with [employer] at [phone] Useful website: [email or web address]

## 2021-04-14 NOTE — Patient Instructions (Signed)
Patient Instructions  Do your V-go clicks 15 minutes before you eat.   Consider exercise to 5 days per week  Consider some simple meal planning.  Salad or lower fat option most often for lunch   Move dinner up (generally before 6). Small snack at night if blood glucose is in the lower range.  Pay attention to nutrition quality.   Increase your vegetables.   Monitor your blood sugar before and after your meals to see the effect of food.             A rise of 40-60 points is normal but after salad or if you walked after dinner it may not rise much.   Listen to your body.  When are you satisfied? Before snacking, ask, "Am I hungry or eating for another reason?"

## 2021-04-15 ENCOUNTER — Encounter: Payer: Self-pay | Admitting: Dietician

## 2021-04-20 DIAGNOSIS — B351 Tinea unguium: Secondary | ICD-10-CM | POA: Diagnosis not present

## 2021-04-20 DIAGNOSIS — M79675 Pain in left toe(s): Secondary | ICD-10-CM | POA: Diagnosis not present

## 2021-04-20 DIAGNOSIS — E1142 Type 2 diabetes mellitus with diabetic polyneuropathy: Secondary | ICD-10-CM | POA: Diagnosis not present

## 2021-04-20 DIAGNOSIS — B353 Tinea pedis: Secondary | ICD-10-CM | POA: Diagnosis not present

## 2021-04-20 DIAGNOSIS — M79674 Pain in right toe(s): Secondary | ICD-10-CM | POA: Diagnosis not present

## 2021-04-25 ENCOUNTER — Other Ambulatory Visit: Payer: Self-pay | Admitting: Student in an Organized Health Care Education/Training Program

## 2021-04-25 DIAGNOSIS — M4802 Spinal stenosis, cervical region: Secondary | ICD-10-CM

## 2021-04-28 NOTE — Telephone Encounter (Signed)
Close note/encounter

## 2021-04-29 ENCOUNTER — Telehealth: Payer: Self-pay | Admitting: Student in an Organized Health Care Education/Training Program

## 2021-04-29 DIAGNOSIS — M4802 Spinal stenosis, cervical region: Secondary | ICD-10-CM

## 2021-04-29 NOTE — Telephone Encounter (Signed)
Patient lvmail at 8:23 today stating she called her pharmacy to fill gabapenting and was told they do not have a script. Please check this and advise patient. Her next MM appt is in 6 months per Dr. Holley Raring orders.

## 2021-05-02 MED ORDER — GABAPENTIN 100 MG PO CAPS: 100.0000 mg | ORAL_CAPSULE | Freq: Every day | ORAL | 5 refills | Status: DC

## 2021-05-02 NOTE — Addendum Note (Signed)
Addended by: Gillis Santa on: 05/02/2021 10:34 AM   Modules accepted: Orders

## 2021-05-05 ENCOUNTER — Other Ambulatory Visit: Payer: Self-pay | Admitting: Internal Medicine

## 2021-05-05 DIAGNOSIS — I10 Essential (primary) hypertension: Secondary | ICD-10-CM

## 2021-05-05 NOTE — Telephone Encounter (Signed)
Requested medication (s) are due for refill today:   Yes for both  Requested medication (s) are on the active medication list:   Yes for both  Future visit scheduled:   No   CPE 4 months ago   Last ordered: losartan 03/06/2021 #90, 0 refill;   amlodipine 03/09/2020 #90, 3 refills  Returned because protocol failed due to labs being overdue.      Requested Prescriptions  Pending Prescriptions Disp Refills   losartan (COZAAR) 100 MG tablet [Pharmacy Med Name: Losartan Potassium 100 MG Oral Tablet] 90 tablet 0    Sig: Take 1 tablet by mouth once daily     Cardiovascular:  Angiotensin Receptor Blockers Failed - 05/05/2021  1:06 PM      Failed - Cr in normal range and within 180 days    Creatinine  Date Value Ref Range Status  11/22/2012 0.78 0.60 - 1.30 mg/dL Final   Creatinine, Ser  Date Value Ref Range Status  10/01/2020 0.85 0.40 - 1.20 mg/dL Final   Creatinine,U  Date Value Ref Range Status  10/01/2020 123.4 mg/dL Final          Failed - K in normal range and within 180 days    Potassium  Date Value Ref Range Status  10/01/2020 4.4 3.5 - 5.1 mEq/L Final  10/04/2012 4.1 3.5 - 5.1 mmol/L Final          Failed - Last BP in normal range    BP Readings from Last 1 Encounters:  04/14/21 (!) 148/82          Passed - Patient is not pregnant      Passed - Valid encounter within last 6 months    Recent Outpatient Visits           1 month ago Cervical spondylosis with radiculopathy   [facility] Montel Culver, MD   2 months ago Cervical spondylosis with radiculopathy   [facility] Montel Culver, MD   2 months ago Cervical spondylosis with radiculopathy   [facility] Montel Culver, MD   2 months ago Back muscle spasm   [facility] Glean Hess, MD   4 months ago Annual physical exam   [facility] Glean Hess, MD               amLODipine (NORVASC) 10 MG tablet [Pharmacy Med Name: amLODIPine  Besylate 10 MG Oral Tablet] 90 tablet 0    Sig: Take 1 tablet by mouth once daily     Cardiovascular: Calcium Channel Blockers 2 Failed - 05/05/2021  1:06 PM      Failed - Last BP in normal range    BP Readings from Last 1 Encounters:  04/14/21 (!) 148/82          Passed - Last Heart Rate in normal range    Pulse Readings from Last 1 Encounters:  04/14/21 68          Passed - Valid encounter within last 6 months    Recent Outpatient Visits           1 month ago Cervical spondylosis with radiculopathy   [facility] Montel Culver, MD   2 months ago Cervical spondylosis with radiculopathy   [facility] Montel Culver, MD   2 months ago Cervical spondylosis with radiculopathy   [facility] Montel Culver, MD   2 months ago Back muscle spasm  [facility] Glean Hess, MD   4 months ago Annual physical exam   [facility] Glean Hess, MD

## 2021-06-11 ENCOUNTER — Other Ambulatory Visit: Payer: Self-pay | Admitting: Internal Medicine

## 2021-06-13 NOTE — Telephone Encounter (Signed)
Requested medication (s) are due for refill today: no pt has rx for freestyle libre ? ?Requested medication (s) are on the active medication list: no ? ?Last refill:  06/10/20 ? ?Future visit scheduled: no ? ?Notes to clinic: rx was dc'd on 02/10/21. Please assess ? ? ?  ?Requested Prescriptions  ?Pending Prescriptions Disp Refills  ? ONETOUCH VERIO test strip [Pharmacy Med Name: OneTouch Verio In Vitro Strip] 300 each 0  ?  Sig: USE 1 STRIP TO CHECK GLUCOSE 4 TIMES DAILY  ?  ? Endocrinology: Diabetes - Testing Supplies Passed - 06/11/2021 11:34 AM  ?  ?  Passed - Valid encounter within last 12 months  ?  Recent Outpatient Visits   ? ?      ? 2 months ago Cervical spondylosis with radiculopathy  ? [facility] Montel Culver, MD  ? 3 months ago Cervical spondylosis with radiculopathy  ? [facility] Montel Culver, MD  ? 4 months ago Cervical spondylosis with radiculopathy  ? [facility] Montel Culver, MD  ? 4 months ago Back muscle spasm  ? [facility] Glean Hess, MD  ? 5 months ago Annual physical exam  ? [facility] Glean Hess, MD  ? ?  ?  ? ?  ?  ?  ? ?

## 2021-06-16 ENCOUNTER — Encounter: Payer: Self-pay | Admitting: Dietician

## 2021-06-16 ENCOUNTER — Other Ambulatory Visit: Payer: Self-pay

## 2021-06-16 ENCOUNTER — Encounter: Payer: HMO | Attending: Endocrinology | Admitting: Dietician

## 2021-06-16 ENCOUNTER — Encounter: Payer: Self-pay | Admitting: Endocrinology

## 2021-06-16 ENCOUNTER — Ambulatory Visit (INDEPENDENT_AMBULATORY_CARE_PROVIDER_SITE_OTHER): Payer: HMO | Admitting: Endocrinology

## 2021-06-16 VITALS — BP 124/84 | HR 80 | Ht 62.0 in | Wt 160.4 lb

## 2021-06-16 DIAGNOSIS — E1169 Type 2 diabetes mellitus with other specified complication: Secondary | ICD-10-CM

## 2021-06-16 DIAGNOSIS — E785 Hyperlipidemia, unspecified: Secondary | ICD-10-CM | POA: Diagnosis not present

## 2021-06-16 DIAGNOSIS — E118 Type 2 diabetes mellitus with unspecified complications: Secondary | ICD-10-CM | POA: Insufficient documentation

## 2021-06-16 DIAGNOSIS — E1165 Type 2 diabetes mellitus with hyperglycemia: Secondary | ICD-10-CM | POA: Diagnosis not present

## 2021-06-16 DIAGNOSIS — Z794 Long term (current) use of insulin: Secondary | ICD-10-CM | POA: Diagnosis not present

## 2021-06-16 NOTE — Patient Instructions (Addendum)
Take off pump at bedtime and start on waking pump ? ?Bolus 2 clicks for any ice cream or snacks, no clicks at bedtime ? ?Click 4-5 at supper ? ?Next Rx '1mg'$  Ozempic ? ?Check blood sugars on waking up days a week ? ?Also check blood sugars about 2 hours after meals and do this after different meals by rotation ? ?Recommended blood sugar levels on waking up are 90-130 and about 2 hours after meal is 130-180 ? ?Bedtime target 150 ? ?Please bring your blood sugar monitor to each visit, thank you ? ?

## 2021-06-16 NOTE — Patient Instructions (Signed)
Do your V-go clicks 15 minutes before you eat. ?Per MD, remove the v-go at night and resume when you wake. ? ?Great job on the changes that you have made. ? ?Consider exercise to 5 days per week ?  ?Consider some simple meal planning. ?Increase your plants and keep meat portions small ? ?Salad or lower fat option most often for lunch ?  ?Move dinner up (generally before 6). ?Small snack at night if blood glucose is in the lower range.  Pay attention to nutrition quality. ?  ?Increase your vegetables. ?  ?Monitor your blood sugar before and after your meals to see the effect of food. ?            A rise of 40-60 points is normal but after salad or if you walked after dinner it may not rise much. ?  ?Listen to your body.  When are you satisfied? ?Before snacking, ask, "Am I hungry or eating for another reason?" ? ? ?

## 2021-06-16 NOTE — Progress Notes (Signed)
? ?Diabetes Self-Management Education ? ?Visit Type: Follow-up ? ?Appt. Start Time: 1020 Appt. End Time: 2993 ? ?06/17/2021 ? ?[name], identified by name and date of birth, is a 76 y.o. female with a diagnosis of Diabetes:  .  ? ?ASSESSMENT ?Patient is here with her husband.  She was last seen by this RD on 04/14/2021 ?Marland Kitchen ?She has started on Ozempic which has decreased her blood sugar.  It has not decreased her appetite yet and has just started the 0.5 dose. ?She still forgets the clicks before meals at times.  Her husband and son remind her at times.  ?She is more mindful to stop working at midnight to aim for a better sleep schedule. ?Continues to use the Libre CGM and reading is 110 now. ?Glucose is now low at night at times and MD stated that she should remove the v-go at night and replace each am. ? ?Since last visit, she stopped eating bacon, she is going to the gym once per week and walking once per week at a minimum.  They also have a treadmill and she is considering using this. ? ?History includes Type 2 diabete,s HTN, HLD, OSA - "should wear c-pap" ?Medications include Novolg via a Vgo 20 (4 clicks per meal and forgets at the start of the meal at times).  She is not taking Ozempic due to cost ?Labs include:  A1C 8.5% 04/14/2021, 7.5 01/04/2021, 7.6% 10/01/2020, vitamin D 34 12/28/2020, GFR 66 10/01/20, Cholesterol 211, HDL 87, LDL 111, Triglycerides 63 (10/01/2020) ?CGM:  Libre2 ? ?Weight hx: ?157 lbs 06/16/2021 ?167 lbs 04/14/2021 ?161 lbs 02/10/2021 ?166 lbs 01/04/2021 ?150 lbs prior to covid ?  ?Patient lives with her husband.  Their son is there currently as well.  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 ?Weight 157 lb (71.2 kg). ?Body mass index is 28.72 kg/m?. ? ? Diabetes Self-Management Education - 06/17/21 1800   ? ?  ? Visit Information  ? Visit Type Follow-up   ?  ? Psychosocial Assessment  ? Other persons present  Patient;Spouse/SO   ?  ? Pre-Education Assessment  ? Patient understands the diabetes disease and treatment process. Demonstrates understanding / competency   ? Patient understands incorporating nutritional management into lifestyle. Needs Review   ? Patient undertands incorporating physical activity into lifestyle. Demonstrates understanding / competency   ? Patient understands using medications safely. Demonstrates understanding / competency   ? Patient understands monitoring blood glucose, interpreting and using results Demonstrates understanding / competency   ? Patient understands prevention, detection, and treatment of acute complications. Demonstrates understanding / competency   ? Patient understands prevention, detection, and treatment of chronic complications. Demonstrates understanding / competency   ? Patient understands how to develop strategies to address psychosocial issues. Demonstrates understanding / competency   ? Patient understands how to develop strategies to promote health/change behavior. Needs Review   ?  ? Complications  ? How often do you check your blood sugar? > 4 times/day   ? Fasting Blood glucose range (mg/dL) 70-129   ? Number of hypoglycemic episodes per month 2   ?  ? Dietary Intake  ? Breakfast oatmeal or leftovers   ? Lunch usually none   ? Dinner pork chops cooked in gravy, rice, green peas   ? Beverage(s) water, flavored water   ?  ? Exercise  ? How many days per week to you exercise? 2   ?  How many minutes per day do you exercise? 45   ? Total minutes per week of exercise 90   ?  ? Patient Education  ? Previous Diabetes Education Yes (please comment)   ? Nutrition management  Meal options for control of blood glucose level and chronic complications.;Food label reading, portion sizes and measuring food.   ? Physical activity and exercise  Other (comment)   encouraged  ? Medications Reviewed patients medication for diabetes, action, purpose, timing of dose and side effects.   ?   ? Individualized Goals (developed by patient)  ? Nutrition General guidelines for healthy choices and portions discussed   ? Physical Activity Exercise 3-5 times per week;45 minutes per day   ? Medications take my medication as prescribed   ? Monitoring  test my blood glucose as discussed   ? Reducing Risk examine blood glucose patterns;increase portions of healthy fats   ?  ? Patient Self-Evaluation of Goals - Patient rates self as meeting previously set goals (% of time)  ? Nutrition >75%   ? Physical Activity 50 - 75 %   ? Medications >75%   ? Monitoring >75%   ? Problem Solving >75%   ? Reducing Risk >75%   ? Health Coping >75%   ?  ? Post-Education Assessment  ? Patient understands the diabetes disease and treatment process. Demonstrates understanding / competency   ? Patient understands incorporating nutritional management into lifestyle. Needs Review   ? Patient undertands incorporating physical activity into lifestyle. Demonstrates understanding / competency   ? Patient understands using medications safely. Demonstrates understanding / competency   ? Patient understands monitoring blood glucose, interpreting and using results Demonstrates understanding / competency   ? Patient understands prevention, detection, and treatment of acute complications. Demonstrates understanding / competency   ? Patient understands prevention, detection, and treatment of chronic complications. Demonstrates understanding / competency   ? Patient understands how to develop strategies to address psychosocial issues. Demonstrates understanding / competency   ? Patient understands how to develop strategies to promote health/change behavior. Needs Review   ?  ? Outcomes  ? Expected Outcomes Demonstrated interest in learning. Expect positive outcomes   ? Future DMSE 2 months   ? Program Status Not Completed   ?  ? Subsequent Visit  ? Since your last visit have you experienced any weight changes? Loss   ? Weight Loss (lbs) 10   ? ?  ?   ? ?  ? ? ?Individualized Plan for Diabetes Self-Management Training:  ? ?Learning Objective:  Patient will have a greater understanding of diabetes self-management. ?Patient education plan is to attend individual and/or group sessions per assessed needs and concerns. ?  ?Plan:  ? ?Patient Instructions  ?Do your V-go clicks 15 minutes before you eat. ?Per MD, remove the v-go at night and resume when you wake. ? ?Great job on the changes that you have made. ? ?Consider exercise to 5 days per week ?  ?Consider some simple meal planning. ?Increase your plants and keep meat portions small ? ?Salad or lower fat option most often for lunch ?  ?Move dinner up (generally before 6). ?Small snack at night if blood glucose is in the lower range.  Pay attention to nutrition quality. ?  ?Increase your vegetables. ?  ?Monitor your blood sugar before and after your meals to see the effect of food. ?            A rise of 40-60 points is  normal but after salad or if you walked after dinner it may not rise much. ?  ?Listen to your body.  When are you satisfied? ?Before snacking, ask, "Am I hungry or eating for another reason?" ? ? ? ?Expected Outcomes:  Demonstrated interest in learning. Expect positive outcomes ? ?Education material provided:  ? ?If problems or questions, patient to contact team via:  Phone ? ?Future DSME appointment: 2 months ? ? ?

## 2021-06-16 NOTE — Progress Notes (Signed)
Patient ID: Demetrio Lapping, female   DOB: 1945/08/14, 76 y.o.   MRN: [id number]  ? ?       ? ? ?Reason for Appointment: Follow-up for Type 2 Diabetes ? ? ?History of Present Illness:  ?        ?Date of diagnosis of type 2 diabetes mellitus: ?  2004       ? ?Background history:  ? ?She previously had been on metformin and also Avandia initially ?Detailed records of her care are not available prior to 2013 or so ?She was tried on Januvia in 2016 but this was not effective and this was stopped in 08/2015 ?She was started on insulin in 2/16 with small doses of Lantus insulin ?Her A1c has been consistently over 8% since about 2013 ? ?Recent history:  ? ?INSULIN regimen is:   V-go 20 unit pump since 11/12/55, boluses 0-4 clicks with meals    ? ?Non-insulin hypoglycemic drugs the patient is taking are: Ozempic 0.5 ? ?Her A1c is last 8.5 compared to 7.5 ?No recent labs available ? ?Current management, blood sugar patterns and problems identified: ?She appears to have much better blood sugar control with adding Ozempic from her last visit  ?She is not getting tendency to hypoglycemia including symptomatic overnight mostly between 3-7 AM ?She did not call to report this  ?Although she sometimes has high blood sugars late at night she does not think she is taking any correction bolus clicks late at night for these readings  ?She is changing her pump at bedtime daily  ?Blood sugars after meals are variably controlled with occasional significantly high readings ?She thinks that some of her high readings late in the evening are from eating ice cream ?She may not always bolus for extra snacks or dessert  ?She has no nausea recently from [place] and her weight is down 7 pounds ?Previously has a tendency to weight gain ? ? ?     Side effects from medications have been: nausea and diarrhea from 1000 MG metformin ? ?Compliance with the medical regimen: fair ? ?Last 2 weeks data from freestyle libre version 2 shows the following  interpretation and statistics ? ?Summary of patterns:  ?Adequacy of monitoring is inadequate with missing data on some days  ?compared to her last visit her blood sugar is overall much lower but much better time in range ?However she has more significant hypoglycemia than before  ?overnight blood sugars are dropping significantly between midnight-3 AM with periodic blood sugars in the hypoglycemic range although inconsistently and less in the last 3 or 4 days  ?POSTPRANDIAL readings on an average are not significantly higher after breakfast but periodically go up over 200 after lunch ?Blood sugars after dinner are generally trending higher but not consistently, usually blood sugars are relatively high at bedtime  ?Hypoglycemia is mostly overnight as above ?Blood sugars show only modest variability overall with GV 35 but more so in the evening ?Marland Kitchen ?CGM use % of time 55  ?2-week average/GV 133  ?Time in range     77   %  ?% Time Above 180 17  ?% Time above 250 1  ?% Time Below 70 5  ? ?  ?PRE-MEAL Fasting Lunch Dinner Bedtime Overall  ?Glucose range:       ?Averages: 83  134 178 133  ? ?POST-MEAL PC Breakfast PC Lunch PC Dinner  ?Glucose range:     ?Averages: 130 159   ? ?Prior ? ? ?CGM  use % of time 54  ?2-week average/GV 192/32  ?Time in range     39   %  ?% Time Above 180 42+17  ?% Time above 250   ?% Time Below 70 2  ? ?  ?PRE-MEAL Fasting Lunch Dinner 12 AM Overall  ?Glucose range:       ?Averages: 141   243   ? ?POST-MEAL PC Breakfast PC Lunch PC Dinner  ?Glucose range:     ?Averages: 231  200  ?  ? ? ? ?Self-care:   ?Meal times are:  Breakfast is at 10 AM, dinner 8, lunch 2-3 pm ? ?Typical meal intake: Breakfast is toast, oatmeal, sometimes bacon, milk   ?Lunch: Sometimes hot dogs, fish sandwich ?Evening meal is a protein like chicken, steak, pork chop, corn, greens.   ?        ?Dietician visit, most recent: 04/2021 ? ?CDE visit: 12/2015 ? ? ?Weight history: ? ?Wt Readings from Last 3 Encounters:  ?06/16/21 160 lb  6.4 oz (72.8 kg)  ?04/14/21 167 lb 12.8 oz (76.1 kg)  ?03/17/21 160 lb (72.6 kg)  ? ? ?Glycemic control: ?  ?Lab Results  ?Component Value Date  ? HGBA1C 8.5 (A) 04/14/2021  ? HGBA1C 7.5 (A) 01/04/2021  ? HGBA1C 7.6 (A) 10/01/2020  ? ?Lab Results  ?Component Value Date  ? MICROALBUR 0.8 10/01/2020  ? LDLCALC 111 (H) 10/01/2020  ? CREATININE 0.85 10/01/2020  ? ?Lab Results  ?Component Value Date  ? MICRALBCREAT 0.7 10/01/2020  ? ? ?Other active problems: See review of systems ? ? ?Allergies as of 06/16/2021   ? ?   Reactions  ? Ace Inhibitors Cough  ? Wheat Bran Hives  ? Gramineae Pollens   ? ?  ? ?  ?Medication List  ?  ? ?  ? Accurate as of June 16, 2021  9:08 PM. If you have any questions, ask your nurse or doctor.  ?  ?  ? ?  ? ?acetaminophen 500 MG tablet ?Commonly known as: TYLENOL ?Take 1,000 mg by mouth 2 (two) times daily. As needed ?  ?amLODipine 10 MG tablet ?Commonly known as: NORVASC ?Take 1 tablet by mouth once daily ?  ?CALCIUM 1200 PO ?Take 2 each by mouth daily. ?  ?FreeStyle Libre 14 Day Reader Kerrin Mo ?See admin instructions. ?  ?FreeStyle Libre 2 Sensor Misc ?APPLY 1 SENSOR TO BODY EVERY 14 DAYS TO MONITOR BLOOD SUGAR ?  ?gabapentin 100 MG capsule ?Commonly known as: Neurontin ?Take 1-3 capsules (100-300 mg total) by mouth at bedtime. Follow written titration schedule. ?  ?GINKGO BILOBA COMPLEX PO ?Take 1 capsule by mouth daily. ?  ?insulin aspart 100 UNIT/ML injection ?Commonly known as: novoLOG ?USE A MAX OF 90 UNITS DAILY VIA INSULIN PUMP ?  ?losartan 100 MG tablet ?Commonly known as: COZAAR ?Take 1 tablet by mouth once daily ?  ?NON FORMULARY ?CPAP @@ 9 cm H2O nightly ?  ?omeprazole 20 MG capsule ?Commonly known as: PRILOSEC ?Take 1 capsule (20 mg total) by mouth daily. ?  ?OneTouch Verio test strip ?Generic drug: glucose blood ?USE 1 STRIP TO CHECK GLUCOSE 4 TIMES DAILY ?  ?Ozempic (0.25 or 0.5 MG/DOSE) 2 MG/1.5ML Sopn ?Generic drug: Semaglutide(0.25 or 0.5MG/DOS) ?Inject 0.5 mg into the skin  once a week. ?  ?pravastatin 20 MG tablet ?Commonly known as: PRAVACHOL ?Take 1 tablet by mouth once daily ?What changed: when to take this ?  ?V-Go 20 Kit ?1  ONCE DAILY ?  ?  Vitamin D 50 MCG (2000 UT) Caps ?Take 1 capsule (2,000 Units total) by mouth daily. ?  ? ?  ? ? ?Allergies:  ?Allergies  ?Allergen Reactions  ? Ace Inhibitors Cough  ? Wheat Bran Hives  ? Gramineae Pollens   ? ? ?Past Medical History:  ?Diagnosis Date  ? Allergy   ? Arthritis   ? Cataract   ? Diabetes mellitus without complication ([place])   ? Hyperlipidemia   ? Hypertension   ? Motion sickness   ? planes  ? OSA (obstructive sleep apnea) 08/23/2016  ? CPAP  ? Osteoporosis   ? Presence of dental prosthetic device   ? implants  ? Radiculopathy of lumbosacral region 06/22/2016  ? Right lateral epicondylitis 06/04/2017  ? Right sided sciatica 03/09/2016  ? Sleep apnea   ? ? ?Past Surgical History:  ?Procedure Laterality Date  ? ABDOMINAL HYSTERECTOMY    ? CATARACT EXTRACTION Right 2020  ? COLONOSCOPY WITH PROPOFOL N/A 10/10/2019  ? Procedure: COLONOSCOPY WITH PROPOFOL;  Surgeon: Lucilla Lame, MD;  Location: [facility];  Service: Endoscopy;  Laterality: N/A;  ? ESOPHAGOGASTRODUODENOSCOPY (EGD) WITH PROPOFOL N/A 10/10/2019  ? Procedure: ESOPHAGOGASTRODUODENOSCOPY (EGD) WITH PROPOFOL;  Surgeon: Lucilla Lame, MD;  Location: [facility];  Service: Endoscopy;  Laterality: N/A;  Diabetic - insulin pump and oral meds ?sleep apnea  ? POLYPECTOMY  10/10/2019  ? Procedure: POLYPECTOMY;  Surgeon: Lucilla Lame, MD;  Location: [facility];  Service: Endoscopy;;  ? ? ?Family History  ?Problem Relation Age of Onset  ? Breast cancer Other 40  ? Alcohol abuse Father   ? Mental illness Father   ? Stroke Father   ? Diabetes Sister   ? Asthma Brother   ? Diabetes Brother   ? Diabetes Maternal Aunt   ? Heart disease Maternal Aunt   ? Diabetes Maternal Uncle   ? Early death Maternal Uncle   ? Breast cancer Cousin 81  ?     mat cousin  ? ? ?Social History:   reports that she has never smoked. She has never used smokeless tobacco. She reports that she does not currently use alcohol after a past usage of about 1.0 standard drink per week. She reports that she does not [place]

## 2021-07-01 ENCOUNTER — Encounter: Payer: Self-pay | Admitting: Internal Medicine

## 2021-07-01 ENCOUNTER — Ambulatory Visit (INDEPENDENT_AMBULATORY_CARE_PROVIDER_SITE_OTHER): Payer: HMO | Admitting: Internal Medicine

## 2021-07-01 VITALS — BP 118/80 | HR 68 | Temp 98.2°F | Ht 62.0 in | Wt 156.0 lb

## 2021-07-01 DIAGNOSIS — R194 Change in bowel habit: Secondary | ICD-10-CM

## 2021-07-01 DIAGNOSIS — R1084 Generalized abdominal pain: Secondary | ICD-10-CM | POA: Diagnosis not present

## 2021-07-01 DIAGNOSIS — J069 Acute upper respiratory infection, unspecified: Secondary | ICD-10-CM

## 2021-07-01 MED ORDER — PROMETHAZINE-DM 6.25-15 MG/5ML PO SYRP: 5.0000 mL | ORAL_SOLUTION | Freq: Four times a day (QID) | ORAL | 0 refills | Status: DC | PRN

## 2021-07-01 MED ORDER — AZITHROMYCIN 250 MG PO TABS
ORAL_TABLET | ORAL | 0 refills | Status: AC
Start: 2021-07-01 — End: 2021-07-06

## 2021-07-01 NOTE — Patient Instructions (Signed)
Try cutting the Ozempic dose to 0.5 mg weekly ?

## 2021-07-01 NOTE — Progress Notes (Signed)
? ? ?Date:  07/01/2021  ? ?Name:  Melinda Ford   DOB:  18-Sep-1945   MRN:  [id number] ? ? ?Chief Complaint: No chief complaint on file. ? ?Abdominal Pain ?This is a new problem. The current episode started 1 to 4 weeks ago. The problem occurs intermittently. The problem has been waxing and waning. The pain is located in the suprapubic region. The pain is moderate. The quality of the pain is dull. Associated symptoms include constipation, diarrhea and nausea. Pertinent negatives include no fever or vomiting.  ?Cough ?This is a new problem. The current episode started 1 to 4 weeks ago. The cough is Productive of sputum. Pertinent negatives include no chills, fever, shortness of breath or wheezing. The symptoms are aggravated by lying down.  ? ?Lab Results  ?Component Value Date  ? NA 137 10/01/2020  ? K 4.4 10/01/2020  ? CO2 27 10/01/2020  ? GLUCOSE 152 (H) 10/01/2020  ? BUN 25 (H) 10/01/2020  ? CREATININE 0.85 10/01/2020  ? CALCIUM 10.1 10/01/2020  ? GFRNONAA 98 09/10/2015  ? ?Lab Results  ?Component Value Date  ? CHOL 211 (H) 10/01/2020  ? HDL 87.00 10/01/2020  ? LDLCALC 111 (H) 10/01/2020  ? TRIG 63.0 10/01/2020  ? CHOLHDL 2 10/01/2020  ? ?Lab Results  ?Component Value Date  ? TSH 1.650 12/28/2020  ? ?Lab Results  ?Component Value Date  ? HGBA1C 8.5 (A) 04/14/2021  ? ?Lab Results  ?Component Value Date  ? WBC 5.1 12/28/2020  ? HGB 14.7 12/28/2020  ? HCT 45.1 12/28/2020  ? MCV 92 12/28/2020  ? PLT 286 12/28/2020  ? ?Lab Results  ?Component Value Date  ? ALT 34 10/01/2020  ? AST 24 10/01/2020  ? ALKPHOS 76 10/01/2020  ? BILITOT 0.4 10/01/2020  ? ?Lab Results  ?Component Value Date  ? VD25OH 34.0 12/28/2020  ?  ? ?Review of Systems  ?Constitutional:  Negative for chills, diaphoresis, fatigue and fever.  ?Respiratory:  Positive for cough. Negative for shortness of breath and wheezing.   ?Gastrointestinal:  Positive for abdominal pain, constipation, diarrhea and nausea. Negative for blood in stool, rectal pain and  vomiting.  ?Psychiatric/Behavioral:  Negative for dysphoric mood and sleep disturbance. The patient is not nervous/anxious.   ? ?Patient Active Problem List  ? Diagnosis Date Noted  ? Cervical radicular pain (left C5,6) 03/17/2021  ? 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 Chronic pain syndrome 03/17/2021  ? 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/2021  ? Cervical paraspinal muscle spasm 02/10/2021  ? Gastroesophageal reflux disease 12/28/2020  ? Personal history of colonic polyps   ? Polyp of sigmoid colon   ? Abdominal pain, epigastric   ? Patient noncompliant with statin medication 09/12/2019  ? Type II diabetes mellitus with complication ([employer]) 19/37/9024  ? Hyperlipidemia associated with type 2 diabetes mellitus ([place]) 05/17/2018  ? 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 Chronic midline thoracic back pain 02/22/2018  ? Tinea versicolor 09/20/2016  ? OSA (obstructive sleep apnea) 08/23/2016  ? Allergic rhinitis 07/13/2016  ? Overweight (BMI 25.0-29.9) 10/01/2015  ? Vitamin D deficiency 09/18/2014  ? Onychomycosis 09/16/2014  ? Degenerative disc disease, lumbar 09/16/2014  ? Benign essential HTN 10/10/2010  ? Osteoporosis 10/10/2010  ? ? ?Allergies  ?Allergen Reactions  ? Ace Inhibitors Cough  ? Wheat Bran Hives  ? Gramineae Pollens   ? ? ?Past Surgical History:  ?Procedure Laterality Date  ? ABDOMINAL HYSTERECTOMY    ? CATARACT EXTRACTION  Right 2020  ? COLONOSCOPY WITH PROPOFOL N/A 10/10/2019  ? Procedure: COLONOSCOPY WITH PROPOFOL;  Surgeon: Lucilla Lame, MD;  Location: [facility];  Service: Endoscopy;  Laterality: N/A;  ? ESOPHAGOGASTRODUODENOSCOPY (EGD) WITH PROPOFOL N/A 10/10/2019  ? Procedure: ESOPHAGOGASTRODUODENOSCOPY (EGD) WITH PROPOFOL;  Surgeon: Lucilla Lame, MD;  Location: [facility];  Service: Endoscopy;  Laterality: N/A;  Diabetic - insulin pump and oral meds ?sleep apnea  ? POLYPECTOMY  10/10/2019  ? Procedure: POLYPECTOMY;  Surgeon: Lucilla Lame, MD;  Location: [facility];  Service: Endoscopy;;  ? ? ?Social History  ? ?Tobacco Use  ? Smoking status: Never  ? Smokeless tobacco: Never  ?Vaping Use  ? Vaping Use: Never used  ?Substance Use Topics  ? Alcohol use: Not Currently  ?  Alcohol/week: 1.0 standard drink  ?  Types: 1 Standard drinks or equivalent per week  ? Drug use: Never  ? ? ? ?Medication list has been reviewed and updated. ? ?Current Meds  ?Medication Sig  ? acetaminophen (TYLENOL) 500 MG tablet Take 1,000 mg by mouth 2 (two) times daily. As needed  ? amLODipine (NORVASC) 10 MG tablet Take 1 tablet by mouth once daily  ? azithromycin (ZITHROMAX Z-PAK) 250 MG tablet UAD  ? Calcium Carbonate-Vit D-Min (CALCIUM 1200 PO) Take 2 each by mouth daily.  ? Cholecalciferol (VITAMIN D) 2000 UNITS CAPS Take 1 capsule (2,000 Units total) by mouth daily.  ? Continuous Blood Gluc Sensor (FREESTYLE LIBRE 2 SENSOR) MISC APPLY 1 SENSOR TO BODY EVERY 14 DAYS TO MONITOR BLOOD SUGAR  ? gabapentin (NEURONTIN) 100 MG capsule Take 1-3 capsules (100-300 mg total) by mouth at bedtime. Follow written titration schedule.  ? GINKGO BILOBA COMPLEX PO Take 1 capsule by mouth daily.  ? glucose blood (ONETOUCH VERIO) test strip USE 1 STRIP TO CHECK GLUCOSE 4 TIMES DAILY  ? insulin aspart (NOVOLOG) 100 UNIT/ML injection USE A MAX OF 90 UNITS DAILY VIA INSULIN PUMP  ? Insulin Disposable Pump (V-GO 20) KIT 1  ONCE DAILY  ? losartan (COZAAR) 100 MG tablet Take 1 tablet by mouth once daily  ? NON FORMULARY CPAP @@ 9 cm H2O nightly  ? omeprazole (PRILOSEC) 20 MG capsule Take 1 capsule (20 mg total) by mouth daily.  ? pravastatin (PRAVACHOL) 20 MG tablet Take 1 tablet by mouth once daily (Patient taking differently: Take 20 mg by mouth 3 (three) times a week.)  ? Semaglutide,0.25 or 0.5MG /DOS, (OZEMPIC, 0.25 OR 0.5 MG/DOSE,) 2 MG/1.5ML SOPN Inject 0.5 mg into the skin once a week.  ? ? ? ?  07/01/2021  ? 10:56 AM 03/17/2021  ? 11:08 AM 02/17/2021  ? 11:58 AM 02/10/2021  ?   3:05 PM  ?GAD 7 : Generalized Anxiety Score  ?Nervous, Anxious, on Edge 0 0 0 0  ?Control/stop worrying 0 0 0 0  ?Worry too much - different things 0 0 0 1  ?Trouble relaxing 0 0 0 1  ?Restless 0 0 0 0  ?Easily annoyed or irritable 0 0 0 0  ?Afraid - awful might happen 0 0 0 0  ?Total GAD 7 Score 0 0 0 2  ?Anxiety Difficulty Not difficult at all Not difficult at all Not difficult at all Not difficult at all  ? ? ? ?  07/01/2021  ? 10:56 AM  ?Depression screen PHQ 2/9  ?Decreased Interest 0  ?Down, Depressed, Hopeless 0  ?PHQ - 2 Score 0  ?Altered sleeping 0  ?Tired, decreased energy 0  ?  Change in appetite 0  ?Feeling bad or failure about yourself  0  ?Trouble concentrating 0  ?Moving slowly or fidgety/restless 0  ?Suicidal thoughts 0  ?PHQ-9 Score 0  ?Difficult doing work/chores Not difficult at all  ? ? ?BP Readings from Last 3 Encounters:  ?07/01/21 118/80  ?06/16/21 124/84  ?04/14/21 (!) 148/82  ? ? ?Physical Exam ?Vitals and nursing note reviewed.  ?Constitutional:   ?   General: She is not in acute distress. ?   Appearance: Normal appearance. She is well-developed.  ?HENT:  ?   Head: Normocephalic and atraumatic.  ?Neck:  ?   Vascular: No carotid bruit.  ?Cardiovascular:  ?   Rate and Rhythm: Normal rate and regular rhythm.  ?   Pulses: Normal pulses.  ?Pulmonary:  ?   Effort: Pulmonary effort is normal. No respiratory distress.  ?   Breath sounds: Examination of the right-upper field reveals wheezing. Wheezing present. No rhonchi.  ?Abdominal:  ?   General: Abdomen is flat.  ?   Palpations: Abdomen is soft.  ?   Tenderness: There is no abdominal tenderness. There is no right CVA tenderness, left CVA tenderness, guarding or rebound.  ?Musculoskeletal:  ?   Cervical back: Normal range of motion.  ?Lymphadenopathy:  ?   Cervical: No cervical adenopathy.  ?Skin: ?   General: Skin is warm and dry.  ?   Findings: No rash.  ?Neurological:  ?   Mental Status: She is alert and oriented to person, place, and time.   ?Psychiatric:     ?   Mood and Affect: Mood normal.     ?   Behavior: Behavior normal.  ? ? ?Wt Readings from Last 3 Encounters:  ?07/01/21 156 lb (70.8 kg)  ?06/17/21 157 lb (71.2 kg)  ?06/16/21 160 lb 6.4 o

## 2021-07-02 LAB — CBC WITH DIFFERENTIAL/PLATELET
Basophils Absolute: 0 10*3/uL (ref 0.0–0.2)
Basos: 1 %
EOS (ABSOLUTE): 0.1 10*3/uL (ref 0.0–0.4)
Eos: 1 %
Hematocrit: 44.4 % (ref 34.0–46.6)
Hemoglobin: 14.5 g/dL (ref 11.1–15.9)
Immature Grans (Abs): 0 10*3/uL (ref 0.0–0.1)
Immature Granulocytes: 0 %
Lymphocytes Absolute: 1.9 10*3/uL (ref 0.7–3.1)
Lymphs: 37 %
MCH: 30.3 pg (ref 26.6–33.0)
MCHC: 32.7 g/dL (ref 31.5–35.7)
MCV: 93 fL (ref 79–97)
Monocytes Absolute: 0.3 10*3/uL (ref 0.1–0.9)
Monocytes: 6 %
Neutrophils Absolute: 2.8 10*3/uL (ref 1.4–7.0)
Neutrophils: 55 %
Platelets: 284 10*3/uL (ref 150–450)
RBC: 4.79 x10E6/uL (ref 3.77–5.28)
RDW: 12.2 % (ref 11.7–15.4)
WBC: 5.2 10*3/uL (ref 3.4–10.8)

## 2021-07-02 LAB — COMPREHENSIVE METABOLIC PANEL
ALT: 21 IU/L (ref 0–32)
AST: 23 IU/L (ref 0–40)
Albumin/Globulin Ratio: 1.7 (ref 1.2–2.2)
Albumin: 4.3 g/dL (ref 3.7–4.7)
Alkaline Phosphatase: 102 IU/L (ref 44–121)
BUN/Creatinine Ratio: 25 (ref 12–28)
BUN: 19 mg/dL (ref 8–27)
Bilirubin Total: 0.4 mg/dL (ref 0.0–1.2)
CO2: 21 mmol/L (ref 20–29)
Calcium: 10.4 mg/dL — ABNORMAL HIGH (ref 8.7–10.3)
Chloride: 109 mmol/L — ABNORMAL HIGH (ref 96–106)
Creatinine, Ser: 0.75 mg/dL (ref 0.57–1.00)
Globulin, Total: 2.6 g/dL (ref 1.5–4.5)
Glucose: 112 mg/dL — ABNORMAL HIGH (ref 70–99)
Potassium: 4.9 mmol/L (ref 3.5–5.2)
Sodium: 142 mmol/L (ref 134–144)
Total Protein: 6.9 g/dL (ref 6.0–8.5)
eGFR: 82 mL/min/{1.73_m2} (ref >59)

## 2021-07-02 LAB — LIPASE: Lipase: 27 U/L (ref 14–85)

## 2021-07-12 ENCOUNTER — Other Ambulatory Visit: Payer: Self-pay | Admitting: Internal Medicine

## 2021-07-13 NOTE — Telephone Encounter (Signed)
Requested Prescriptions  ?Pending Prescriptions Disp Refills  ?? Continuous Blood Gluc Sensor (FREESTYLE LIBRE 2 SENSOR) MISC [Pharmacy Med Name: FREESTYLE LIBRE 2 SENSOR KIT] 2 each 0  ?  Sig: APPLY 1 SENSOR EVERY 14 DAYS TO MONITOR BLOOD SUGAR  ?  ? Endocrinology: Diabetes - Testing Supplies Passed - 07/12/2021  6:04 PM  ?  ?  Passed - Valid encounter within last 12 months  ?  Recent Outpatient Visits   ?      ? 1 week ago Upper respiratory tract infection, unspecified type  ? [facility] Glean Hess, MD  ? 3 months ago Cervical spondylosis with radiculopathy  ? [facility] Montel Culver, MD  ? 4 months ago Cervical spondylosis with radiculopathy  ? [facility] Montel Culver, MD  ? 5 months ago Cervical spondylosis with radiculopathy  ? [facility] Montel Culver, MD  ? 5 months ago Back muscle spasm  ? [facility] Glean Hess, MD  ?  ?  ? ?  ?  ?  ? ? ?

## 2021-07-18 ENCOUNTER — Encounter: Payer: Self-pay | Admitting: Internal Medicine

## 2021-07-18 ENCOUNTER — Ambulatory Visit (INDEPENDENT_AMBULATORY_CARE_PROVIDER_SITE_OTHER): Payer: HMO | Admitting: Internal Medicine

## 2021-07-18 VITALS — BP 146/86 | HR 70 | Temp 98.6°F | Ht 62.0 in | Wt 156.0 lb

## 2021-07-18 DIAGNOSIS — R1084 Generalized abdominal pain: Secondary | ICD-10-CM

## 2021-07-18 DIAGNOSIS — R1013 Epigastric pain: Secondary | ICD-10-CM | POA: Diagnosis not present

## 2021-07-18 DIAGNOSIS — J029 Acute pharyngitis, unspecified: Secondary | ICD-10-CM

## 2021-07-18 MED ORDER — AMOXICILLIN 875 MG PO TABS
875.0000 mg | ORAL_TABLET | Freq: Two times a day (BID) | ORAL | 0 refills | Status: AC
Start: 2021-07-18 — End: 2021-07-28

## 2021-07-18 MED ORDER — OMEPRAZOLE 20 MG PO CPDR: 20.0000 mg | DELAYED_RELEASE_CAPSULE | Freq: Two times a day (BID) | ORAL | 3 refills | Status: DC

## 2021-07-18 NOTE — Progress Notes (Signed)
? ? ?Date:  07/18/2021  ? ?Name:  Melinda Ford   DOB:  05/23/1945   MRN:  [id number] ? ? ?Chief Complaint: Abdominal Pain ? ?Abdominal Pain ?This is a new problem. The current episode started 1 to 4 weeks ago. The onset quality is gradual. The problem occurs intermittently. The problem has been waxing and waning. The pain is located in the suprapubic region. The pain is mild. The quality of the pain is colicky and cramping. Associated symptoms include constipation, diarrhea and nausea. Pertinent negatives include no fever or vomiting. Treatments tried: Lowering dose of Ozempic to 0.5 mg.  ?Sore Throat  ?This is a new problem. The current episode started in the past 7 days. The problem has been gradually worsening. Neither side of throat is experiencing more pain than the other. There has been no fever (but having some chills and feels like the flu). Associated symptoms include abdominal pain and diarrhea. Pertinent negatives include no coughing, shortness of breath or vomiting. She has tried nothing for the symptoms.  ? ?Lab Results  ?Component Value Date  ? NA 142 07/01/2021  ? K 4.9 07/01/2021  ? CO2 21 07/01/2021  ? GLUCOSE 112 (H) 07/01/2021  ? BUN 19 07/01/2021  ? CREATININE 0.75 07/01/2021  ? CALCIUM 10.4 (H) 07/01/2021  ? EGFR 82 07/01/2021  ? GFRNONAA 98 09/10/2015  ? ?Lab Results  ?Component Value Date  ? CHOL 211 (H) 10/01/2020  ? HDL 87.00 10/01/2020  ? LDLCALC 111 (H) 10/01/2020  ? TRIG 63.0 10/01/2020  ? CHOLHDL 2 10/01/2020  ? ?Lab Results  ?Component Value Date  ? TSH 1.650 12/28/2020  ? ?Lab Results  ?Component Value Date  ? HGBA1C 8.5 (A) 04/14/2021  ? ?Lab Results  ?Component Value Date  ? WBC 5.2 07/01/2021  ? HGB 14.5 07/01/2021  ? HCT 44.4 07/01/2021  ? MCV 93 07/01/2021  ? PLT 284 07/01/2021  ? ?Lab Results  ?Component Value Date  ? ALT 21 07/01/2021  ? AST 23 07/01/2021  ? ALKPHOS 102 07/01/2021  ? BILITOT 0.4 07/01/2021  ? ?Lab Results  ?Component Value Date  ? VD25OH 34.0 12/28/2020  ?   ? ?Review of Systems  ?Constitutional:  Positive for chills, diaphoresis and fatigue. Negative for fever and unexpected weight change.  ?HENT:  Positive for sore throat.   ?Respiratory:  Negative for cough, chest tightness and shortness of breath.   ?Cardiovascular:  Negative for chest pain.  ?Gastrointestinal:  Positive for abdominal pain, constipation, diarrhea and nausea. Negative for vomiting.  ?Psychiatric/Behavioral:  Negative for dysphoric mood and sleep disturbance. The patient is not nervous/anxious.   ? ?Patient Active Problem List  ? Diagnosis Date Noted  ? Cervical radicular pain (left C5,6) 03/17/2021  ? 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 Chronic pain syndrome 03/17/2021  ? 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/2021  ? Cervical paraspinal muscle spasm 02/10/2021  ? Gastroesophageal reflux disease 12/28/2020  ? Personal history of colonic polyps   ? Polyp of sigmoid colon   ? Abdominal pain, epigastric   ? Patient noncompliant with statin medication 09/12/2019  ? Type II diabetes mellitus with complication ([place]) 40/98/1191  ? Hyperlipidemia associated with type 2 diabetes mellitus ([place]) 05/17/2018  ? 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 Chronic midline thoracic back pain 02/22/2018  ? Tinea versicolor 09/20/2016  ? OSA (obstructive sleep apnea) 08/23/2016  ? Allergic rhinitis 07/13/2016  ? Overweight (BMI 25.0-29.9) 10/01/2015  ? Vitamin D  deficiency 09/18/2014  ? Onychomycosis 09/16/2014  ? Degenerative disc disease, lumbar 09/16/2014  ? Benign essential HTN 10/10/2010  ? Osteoporosis 10/10/2010  ? ? ?Allergies  ?Allergen Reactions  ? Ace Inhibitors Cough  ? Wheat Bran Hives  ? Gramineae Pollens   ? ? ?Past Surgical History:  ?Procedure Laterality Date  ? ABDOMINAL HYSTERECTOMY    ? CATARACT EXTRACTION Right 2020  ? COLONOSCOPY WITH PROPOFOL N/A 10/10/2019  ? Procedure: COLONOSCOPY WITH PROPOFOL;  Surgeon: Lucilla Lame, MD;  Location: [place];  Service: Endoscopy;  Laterality: N/A;  ? ESOPHAGOGASTRODUODENOSCOPY (EGD) WITH PROPOFOL N/A 10/10/2019  ? Procedure: ESOPHAGOGASTRODUODENOSCOPY (EGD) WITH PROPOFOL;  Surgeon: Lucilla Lame, MD;  Location: [place];  Service: Endoscopy;  Laterality: N/A;  Diabetic - insulin pump and oral meds ?sleep apnea  ? POLYPECTOMY  10/10/2019  ? Procedure: POLYPECTOMY;  Surgeon: Lucilla Lame, MD;  Location: [place];  Service: Endoscopy;;  ? ? ?Social History  ? ?Tobacco Use  ? Smoking status: Never  ? Smokeless tobacco: Never  ?Vaping Use  ? Vaping Use: Never used  ?Substance Use Topics  ? Alcohol use: Not Currently  ?  Alcohol/week: 1.0 standard drink  ?  Types: 1 Standard drinks or equivalent per week  ? Drug use: Never  ? ? ? ?Medication list has been reviewed and updated. ? ?Current Meds  ?Medication Sig  ? acetaminophen (TYLENOL) 500 MG tablet Take 1,000 mg by mouth 2 (two) times daily. As needed  ? amLODipine (NORVASC) 10 MG tablet Take 1 tablet by mouth once daily  ? Calcium Carbonate-Vit D-Min (CALCIUM 1200 PO) Take 2 each by mouth daily.  ? Cholecalciferol (VITAMIN D) 2000 UNITS CAPS Take 1 capsule (2,000 Units total) by mouth daily.  ? Continuous Blood Gluc Sensor (FREESTYLE LIBRE 2 SENSOR) MISC APPLY 1 SENSOR EVERY 14 DAYS TO MONITOR BLOOD SUGAR  ? gabapentin (NEURONTIN) 100 MG capsule Take 1-3 capsules (100-300 mg total) by mouth at bedtime. Follow written titration schedule.  ? GINKGO BILOBA COMPLEX PO Take 1 capsule by mouth daily.  ? glucose blood (ONETOUCH VERIO) test strip USE 1 STRIP TO CHECK GLUCOSE 4 TIMES DAILY  ? insulin aspart (NOVOLOG) 100 UNIT/ML injection USE A MAX OF 90 UNITS DAILY VIA INSULIN PUMP  ? Insulin Disposable Pump (V-GO 20) KIT 1  ONCE DAILY  ? losartan (COZAAR) 100 MG tablet Take 1 tablet by mouth once daily  ? NON FORMULARY CPAP @@ 9 cm H2O nightly  ? omeprazole (PRILOSEC) 20 MG capsule Take 1 capsule (20 mg total) by mouth daily.  ? pravastatin  (PRAVACHOL) 20 MG tablet Take 1 tablet by mouth once daily (Patient taking differently: Take 20 mg by mouth 3 (three) times a week.)  ? Semaglutide,0.25 or 0.5MG /DOS, (OZEMPIC, 0.25 OR 0.5 MG/DOSE,) 2 MG/1.5ML SOPN Inject 0.5 mg into the skin once a week.  ? ? ? ?  07/01/2021  ? 10:56 AM 03/17/2021  ? 11:08 AM 02/17/2021  ? 11:58 AM 02/10/2021  ?  3:05 PM  ?GAD 7 : Generalized Anxiety Score  ?Nervous, Anxious, on Edge 0 0 0 0  ?Control/stop worrying 0 0 0 0  ?Worry too much - different things 0 0 0 1  ?Trouble relaxing 0 0 0 1  ?Restless 0 0 0 0  ?Easily annoyed or irritable 0 0 0 0  ?Afraid - awful might happen 0 0 0 0  ?Total GAD 7 Score 0 0 0 2  ?Anxiety Difficulty Not difficult at  all Not difficult at all Not difficult at all Not difficult at all  ? ? ? ?  07/01/2021  ? 10:56 AM  ?Depression screen PHQ 2/9  ?Decreased Interest 0  ?Down, Depressed, Hopeless 0  ?PHQ - 2 Score 0  ?Altered sleeping 0  ?Tired, decreased energy 0  ?Change in appetite 0  ?Feeling bad or failure about yourself  0  ?Trouble concentrating 0  ?Moving slowly or fidgety/restless 0  ?Suicidal thoughts 0  ?PHQ-9 Score 0  ?Difficult doing work/chores Not difficult at all  ? ? ?BP Readings from Last 3 Encounters:  ?07/18/21 (!) 150/88  ?07/01/21 118/80  ?06/16/21 124/84  ? ? ?Physical Exam ?Vitals and nursing note reviewed.  ?Constitutional:   ?   General: She is not in acute distress. ?   Appearance: She is well-developed.  ?HENT:  ?   Head: Normocephalic and atraumatic.  ?   Right Ear: Tympanic membrane normal.  ?   Left Ear: Tympanic membrane normal.  ?   Nose:  ?   Right Sinus: No maxillary sinus tenderness.  ?   Left Sinus: No maxillary sinus tenderness.  ?   Mouth/Throat:  ?   Palate: No lesions.  ?   Pharynx: Posterior oropharyngeal erythema (mild) present. No oropharyngeal exudate.  ?Cardiovascular:  ?   Rate and Rhythm: Normal rate and regular rhythm.  ?Pulmonary:  ?   Effort: Pulmonary effort is normal. No respiratory distress.   ?Abdominal:  ?   General: Abdomen is flat. Bowel sounds are decreased.  ?   Palpations: Abdomen is soft.  ?   Tenderness: There is generalized abdominal tenderness. There is no guarding or rebound.  ?Skin: ?   General: Skin is

## 2021-07-20 LAB — SPECIMEN STATUS REPORT

## 2021-07-20 LAB — H. PYLORI BREATH TEST: H pylori Breath Test: NEGATIVE

## 2021-08-01 ENCOUNTER — Other Ambulatory Visit: Payer: Self-pay | Admitting: Internal Medicine

## 2021-08-01 DIAGNOSIS — Z1231 Encounter for screening mammogram for malignant neoplasm of breast: Secondary | ICD-10-CM

## 2021-08-02 ENCOUNTER — Encounter: Payer: Self-pay | Admitting: Student in an Organized Health Care Education/Training Program

## 2021-08-02 ENCOUNTER — Ambulatory Visit
Payer: HMO | Attending: Student in an Organized Health Care Education/Training Program | Admitting: Student in an Organized Health Care Education/Training Program

## 2021-08-02 VITALS — BP 163/68 | HR 63 | Temp 96.9°F | Resp 18 | Ht 62.0 in | Wt 160.0 lb

## 2021-08-02 DIAGNOSIS — G894 Chronic pain syndrome: Secondary | ICD-10-CM | POA: Insufficient documentation

## 2021-08-02 DIAGNOSIS — M4802 Spinal stenosis, cervical region: Secondary | ICD-10-CM | POA: Diagnosis not present

## 2021-08-02 DIAGNOSIS — M5412 Radiculopathy, cervical region: Secondary | ICD-10-CM | POA: Insufficient documentation

## 2021-08-02 NOTE — Patient Instructions (Signed)
Cervical radiculopathy: recommend cervical epidural steroid injection at C7/T1 ? ?Steroid: Decadron ?

## 2021-08-02 NOTE — Progress Notes (Signed)
Safety precautions to be maintained throughout the outpatient stay will include: orient to surroundings, keep bed in low position, maintain call bell within reach at all times, provide assistance with transfer out of bed and ambulation.  

## 2021-08-02 NOTE — Progress Notes (Signed)
PROVIDER NOTE: Information contained herein reflects review and annotations entered in association with encounter. Interpretation of such information and data should be left to medically-trained personnel. Information provided to patient can be located elsewhere in the medical record under "Patient Instructions". Document created using STT-dictation technology, any transcriptional errors that may result from process are unintentional.  ?  ?Patient: Melinda Ford  Service Category: E/M  Provider: Gillis Santa, MD  ?DOB: [birth year]  DOS: 08/02/2021  Specialty: Interventional Pain Management  ?MRN: [id number]  Setting: Ambulatory outpatient  PCP: Glean Hess, MD  ?Type: Established Patient    Referring Provider: Glean Hess, MD  ?Location: Office  Delivery: Face-to-face    ? ?HPI  ?[name], a 76 y.o. year old female, is here today because of her Foraminal stenosis of cervical region [M48.02]. [name] primary complain today is Hand Pain (Left ring andpinky finger) ?Last encounter: My last encounter with her was on 04/29/2021. ?Pertinent problems: [name] has Cervical spondylosis with radiculopathy; Cervical paraspinal muscle spasm; Cervical radicular pain (left C5,6); Foraminal stenosis of cervical region; and Chronic pain syndrome on their pertinent problem list. ?Pain Assessment: Severity of Chronic pain is reported as a 2 /10. Location: Finger (Comment which one) Left/ . Onset: More than a month ago. Quality:  (weakness, numbness). Timing: Constant. Modifying factor(s):  . ?Vitals:  height is 5\' 2"  (1.575 m) and weight is 160 lb (72.6 kg). Her temperature is 96.9 ?F (36.1 ?C) (abnormal). Her blood pressure is 163/68 (abnormal) and her pulse is 63. Her respiration is 18 and oxygen saturation is 100%.  ? ?Reason for encounter: evaluation of worsening, or previously known (established) problem.  ? ?Increased left arm and left hand pain.  She is having difficulty with left hand grip and  holding utensils.  Of note, she has cervical spondylosis with impingement at C4-C5, C3-C4.  She also has an accentuated central cord signal at C4-C5 that could be contributing to her radicular symptoms.  She is currently on gabapentin.  She is interested in learning more about a cervical epidural steroid injection.  We discussed this at length today including the risks and benefits associated with injection.  Patient states that she will think about this further and let us know if she would like to proceed. ? ? ? ?ROS  ?Constitutional: Denies any fever or chills ?Gastrointestinal: No reported hemesis, hematochezia, vomiting, or acute GI distress ?Musculoskeletal:  Neck pain with radiation into left arm, forearm, ring and pinky finger ?Neurological: No reported episodes of acute onset apraxia, aphasia, dysarthria, agnosia, amnesia, paralysis, loss of coordination, or loss of consciousness ? ?Medication Review  ?Calcium Carbonate-Vit D-Min, FreeStyle Libre 2 Sensor, Ginkgo Biloba, NON FORMULARY, Semaglutide(0.25 or 0.5MG /DOS), V-Go 20, Vitamin D, acetaminophen, amLODipine, gabapentin, glucose blood, insulin aspart, losartan, omeprazole, and pravastatin ? ?History Review  ?Allergy: [name] is allergic to ace inhibitors, wheat bran, and gramineae pollens. ?Drug: [name]  reports no history of drug use. ?Alcohol:  reports that she does not currently use alcohol after a past usage of about 1.0 standard drink per week. ?Tobacco:  reports that she has never smoked. She has never used smokeless tobacco. ?Social: [name]  reports that she has never smoked. She has never used smokeless tobacco. She reports that she does not currently use alcohol after a past usage of about 1.0 standard drink per week. She reports that she does not use drugs. ?Medical:  has a past medical history of Allergy, Arthritis, Cataract,  Diabetes mellitus without complication ([place]), Hyperlipidemia, Hypertension, Motion sickness, OSA  (obstructive sleep apnea) (08/23/2016), Osteoporosis, Presence of dental prosthetic device, Radiculopathy of lumbosacral region (06/22/2016), Right lateral epicondylitis (06/04/2017), Right sided sciatica (03/09/2016), and Sleep apnea. ?Surgical: [name]  has a past surgical history that includes Abdominal hysterectomy; Cataract extraction (Right, 2020); Colonoscopy with propofol (N/A, 10/10/2019); Esophagogastroduodenoscopy (egd) with propofol (N/A, 10/10/2019); and polypectomy (10/10/2019). ?Family: family history includes Alcohol abuse in her father; Asthma in her brother; Breast cancer (age of onset: 77) in an other family member; Breast cancer (age of onset: 31) in her cousin; Diabetes in her brother, maternal aunt, maternal uncle, and sister; Early death in her maternal uncle; Heart disease in her maternal aunt; Mental illness in her father; Stroke in her father. ? ?Laboratory Chemistry Profile  ? ?Renal ?Lab Results  ?Component Value Date  ? BUN 19 07/01/2021  ? CREATININE 0.75 07/01/2021  ? BCR 25 07/01/2021  ? GFR 66.73 10/01/2020  ? GFRAA 113 09/10/2015  ? GFRNONAA 98 09/10/2015  ?  Hepatic ?Lab Results  ?Component Value Date  ? AST 23 07/01/2021  ? ALT 21 07/01/2021  ? ALBUMIN 4.3 07/01/2021  ? ALKPHOS 102 07/01/2021  ? LIPASE 27 07/01/2021  ?  ?Electrolytes ?Lab Results  ?Component Value Date  ? NA 142 07/01/2021  ? K 4.9 07/01/2021  ? CL 109 (H) 07/01/2021  ? CALCIUM 10.4 (H) 07/01/2021  ?  Bone ?Lab Results  ?Component Value Date  ? VD25OH 34.0 12/28/2020  ?  ?Inflammation (CRP: Acute Phase) (ESR: Chronic Phase) ?Lab Results  ?Component Value Date  ? ESRSEDRATE 12 01/15/2015  ?    ?  ? ?Note: Above Lab results reviewed. ? ?Recent Imaging Review  ?MR CERVICAL SPINE WO CONTRAST ?CLINICAL DATA:  Chronic neck pain and cervicalgia. Left arm pain ?since November 2022 with numbness and tingling in the left arm. ? ?EXAM: ?MRI CERVICAL SPINE WITHOUT CONTRAST ? ?TECHNIQUE: ?Multiplanar, multisequence MR imaging of the  cervical spine was ?performed. No intravenous contrast was administered. ? ?COMPARISON:  Radiographs 02/10/2021 ? ?FINDINGS: ?Despite efforts by the technologist and patient, motion artifact is ?present on today's exam and could not be eliminated. This reduces ?exam sensitivity and specificity. ? ?Alignment: 2 mm degenerative retrolisthesis at C4-5. Mild reversal ?of the normal cervical lordosis centered at the C4 level. ? ?Vertebrae: Disc desiccation is present throughout the cervical spine ?with loss of disc height particularly at C4-5 and C5-6. Degenerative ?endplate findings primarily at C4-5 and C5-6. ? ?Cord: Equivocal accentuated central cord signal at the C4-5 level on ?inversion recovery weighted image 7 series 2, although not confirmed ?on the dedicated T2 weighted images. ? ?Posterior Fossa, vertebral arteries, paraspinal tissues: ?Unremarkable ? ?Disc levels: ? ?C2-3: Borderline central narrowing of the thecal sac due to a small ?central disc protrusion. ? ?C3-4: Moderate central narrowing of the thecal sac due to short ?pedicles, disc bulge, and intervertebral spurring. Left vertebral ?artery loop extends substantially into the left neural foramen on ?image 7 of series 5, this can occasionally cause impingement related ?symptoms. ? ?C4-5: Prominent right and moderate to prominent left foraminal ?stenosis along with moderate central narrowing of the thecal sac due ?to subluxation, intervertebral spurring, and disc bulge along with ?uncinate spurring. ? ?C5-6: Moderate bilateral foraminal stenosis and moderate central ?narrowing of the thecal sac due to disc osteophyte complex, uncinate ?spurring, and facet arthropathy. ? ?C6-7: Mild bilateral foraminal stenosis and mild central narrowing ?of the thecal sac due to disc bulge and  uncinate spurring. ? ?C7-T1: No impingement.  Mild disc bulge. ? ?IMPRESSION: ?1. Cervical spondylosis and degenerative disc disease, causing ?prominent impingement at C4-5;  moderate impingement at C3-4 and ?C5-6; and mild impingement at C6-7. ?2. Among the findings at the C3-4 level, there is a left vertebral ?artery loop which extends substantially into the left neural ?foramen. This can

## 2021-08-12 ENCOUNTER — Other Ambulatory Visit: Payer: Self-pay

## 2021-08-12 DIAGNOSIS — E1165 Type 2 diabetes mellitus with hyperglycemia: Secondary | ICD-10-CM

## 2021-08-12 MED ORDER — OZEMPIC (0.25 OR 0.5 MG/DOSE) 2 MG/1.5ML ~~LOC~~ SOPN: 0.5000 mg | PEN_INJECTOR | SUBCUTANEOUS | 2 refills | Status: DC

## 2021-08-12 MED ORDER — OZEMPIC (0.25 OR 0.5 MG/DOSE) 2 MG/3ML ~~LOC~~ SOPN: PEN_INJECTOR | SUBCUTANEOUS | 2 refills | Status: DC

## 2021-08-15 ENCOUNTER — Telehealth: Payer: Self-pay | Admitting: Pharmacy Technician

## 2021-08-15 ENCOUNTER — Ambulatory Visit (INDEPENDENT_AMBULATORY_CARE_PROVIDER_SITE_OTHER): Payer: HMO

## 2021-08-15 ENCOUNTER — Other Ambulatory Visit (HOSPITAL_COMMUNITY): Payer: Self-pay

## 2021-08-15 DIAGNOSIS — Z0000 Encounter for general adult medical examination without abnormal findings: Secondary | ICD-10-CM

## 2021-08-15 NOTE — Telephone Encounter (Signed)
Patient Advocate Encounter ?  ?Received notification from CoverMyMeds that prior authorization for Ozempic 0.25 or 0.'5mg'$ /dose is required by his/her insurance [employer]. ? ?  ?PA submitted on 08/15/21 ?Key [id number] ? ? ?Prior Authorization has been approved.   ? ?PA#  [id number] ?Effective dates: 08/15/21 through 08/15/22 ? ?Per Test Claim Patients co-pay is $228.44 for 28 day supply. ? ?Spoke with Pharmacy to process. They will inform the pt of the cost. Pt may not get it.   ? ? ? ? ?

## 2021-08-15 NOTE — Patient Instructions (Signed)
Melinda Ford , ?Thank you for taking time to come for your Medicare Wellness Visit. I appreciate your ongoing commitment to your health goals. Please review the following plan we discussed and let me know if I can assist you in the future.  ? ?Screening recommendations/referrals: ?Colonoscopy: no longer required ?Mammogram: done 08/05/20. Scheduled for 09/15/21 ?Bone Density: done 10/07/19 ?Recommended yearly ophthalmology/optometry visit for glaucoma screening and checkup ?Recommended yearly dental visit for hygiene and checkup ? ?Vaccinations: ?Influenza vaccine: done 12/28/20 ?Pneumococcal vaccine: done 04/16/14 ?Tdap vaccine: done 01/22/17 ?Shingles vaccine: done 01/18/18 & 11/12/18   ?Covid-19:done 05/09/19, 06/06/19, 02/10/20 7 03/25/21 ? ?Advanced directives: Please bring a copy of your health care power of attorney and living will to the office at your convenience. ? ?Conditions/risks identified: recommend fall prevention at home ? ?Next appointment: Follow up in one year for your annual wellness visit  ? ? ?Preventive Care 77 Years and Older, Female ?Preventive care refers to lifestyle choices and visits with your health care provider that can promote health and wellness. ?What does preventive care include? ?A yearly physical exam. This is also called an annual well check. ?Dental exams once or twice a year. ?Routine eye exams. Ask your health care provider how often you should have your eyes checked. ?Personal lifestyle choices, including: ?Daily care of your teeth and gums. ?Regular physical activity. ?Eating a healthy diet. ?Avoiding tobacco and drug use. ?Limiting alcohol use. ?Practicing safe sex. ?Taking low-dose aspirin every day. ?Taking vitamin and mineral supplements as recommended by your health care provider. ?What happens during an annual well check? ?The services and screenings done by your health care provider during your annual well check will depend on your age, overall health, lifestyle risk factors,  and family history of disease. ?Counseling  ?Your health care provider may ask you questions about your: ?Alcohol use. ?Tobacco use. ?Drug use. ?Emotional well-being. ?Home and relationship well-being. ?Sexual activity. ?Eating habits. ?History of falls. ?Memory and ability to understand (cognition). ?Work and work Statistician. ?Reproductive health. ?Screening  ?You may have the following tests or measurements: ?Height, weight, and BMI. ?Blood pressure. ?Lipid and cholesterol levels. These may be checked every 5 years, or more frequently if you are over 80 years old. ?Skin check. ?Lung cancer screening. You may have this screening every year starting at age 39 if you have a 30-pack-year history of smoking and currently smoke or have quit within the past 15 years. ?Fecal occult blood test (FOBT) of the stool. You may have this test every year starting at age 65. ?Flexible sigmoidoscopy or colonoscopy. You may have a sigmoidoscopy every 5 years or a colonoscopy every 10 years starting at age 70. ?Hepatitis C blood test. ?Hepatitis B blood test. ?Sexually transmitted disease (STD) testing. ?Diabetes screening. This is done by checking your blood sugar (glucose) after you have not eaten for a while (fasting). You may have this done every 1-3 years. ?Bone density scan. This is done to screen for osteoporosis. You may have this done starting at age 25. ?Mammogram. This may be done every 1-2 years. Talk to your health care provider about how often you should have regular mammograms. ?Talk with your health care provider about your test results, treatment options, and if necessary, the need for more tests. ?Vaccines  ?Your health care provider may recommend certain vaccines, such as: ?Influenza vaccine. This is recommended every year. ?Tetanus, diphtheria, and acellular pertussis (Tdap, Td) vaccine. You may need a Td booster every 10 years. ?Zoster vaccine. You  may need this after age 38. ?Pneumococcal 13-valent conjugate  (PCV13) vaccine. One dose is recommended after age 63. ?Pneumococcal polysaccharide (PPSV23) vaccine. One dose is recommended after age 3. ?Talk to your health care provider about which screenings and vaccines you need and how often you need them. ?This information is not intended to replace advice given to you by your health care provider. Make sure you discuss any questions you have with your health care provider. ?Document Released: 04/16/2015 Document Revised: 12/08/2015 Document Reviewed: 01/19/2015 ?Elsevier Interactive Patient Education ? 2017 [employer]. ? ?Fall Prevention in the Home ?Falls can cause injuries. They can happen to people of all ages. There are many things you can do to make your home safe and to help prevent falls. ?What can I do on the outside of my home? ?Regularly fix the edges of walkways and driveways and fix any cracks. ?Remove anything that might make you trip as you walk through a door, such as a raised step or threshold. ?Trim any bushes or trees on the path to your home. ?Use bright outdoor lighting. ?Clear any walking paths of anything that might make someone trip, such as rocks or tools. ?Regularly check to see if handrails are loose or broken. Make sure that both sides of any steps have handrails. ?Any raised decks and porches should have guardrails on the edges. ?Have any leaves, snow, or ice cleared regularly. ?Use sand or salt on walking paths during winter. ?Clean up any spills in your garage right away. This includes oil or grease spills. ?What can I do in the bathroom? ?Use night lights. ?Install grab bars by the toilet and in the tub and shower. Do not use towel bars as grab bars. ?Use non-skid mats or decals in the tub or shower. ?If you need to sit down in the shower, use a plastic, non-slip stool. ?Keep the floor dry. Clean up any water that spills on the floor as soon as it happens. ?Remove soap buildup in the tub or shower regularly. ?Attach bath mats securely with  double-sided non-slip rug tape. ?Do not have throw rugs and other things on the floor that can make you trip. ?What can I do in the bedroom? ?Use night lights. ?Make sure that you have a light by your bed that is easy to reach. ?Do not use any sheets or blankets that are too big for your bed. They should not hang down onto the floor. ?Have a firm chair that has side arms. You can use this for support while you get dressed. ?Do not have throw rugs and other things on the floor that can make you trip. ?What can I do in the kitchen? ?Clean up any spills right away. ?Avoid walking on wet floors. ?Keep items that you use a lot in easy-to-reach places. ?If you need to reach something above you, use a strong step stool that has a grab bar. ?Keep electrical cords out of the way. ?Do not use floor polish or wax that makes floors slippery. If you must use wax, use non-skid floor wax. ?Do not have throw rugs and other things on the floor that can make you trip. ?What can I do with my stairs? ?Do not leave any items on the stairs. ?Make sure that there are handrails on both sides of the stairs and use them. Fix handrails that are broken or loose. Make sure that handrails are as long as the stairways. ?Check any carpeting to make sure that it is firmly  attached to the stairs. Fix any carpet that is loose or worn. ?Avoid having throw rugs at the top or bottom of the stairs. If you do have throw rugs, attach them to the floor with carpet tape. ?Make sure that you have a light switch at the top of the stairs and the bottom of the stairs. If you do not have them, ask someone to add them for you. ?What else can I do to help prevent falls? ?Wear shoes that: ?Do not have high heels. ?Have rubber bottoms. ?Are comfortable and fit you well. ?Are closed at the toe. Do not wear sandals. ?If you use a stepladder: ?Make sure that it is fully opened. Do not climb a closed stepladder. ?Make sure that both sides of the stepladder are locked  into place. ?Ask someone to hold it for you, if possible. ?Clearly mark and make sure that you can see: ?Any grab bars or handrails. ?First and last steps. ?Where the edge of each step is. ?Use tools that h

## 2021-08-15 NOTE — Progress Notes (Signed)
? ?Subjective:  ? Melinda Ford is a 76 y.o. female who presents for Medicare Annual (Subsequent) preventive examination. ? ?Virtual Visit via Telephone Note ? ?I connected with  Melinda Ford on 08/15/21 at 11:45 AM EDT by telephone and verified that I am speaking with the correct person using two identifiers. ? ?Location: ?Patient: home ?Provider: [facility] ?Persons participating in the virtual visit: patient/Nurse Health Advisor ?  ?I discussed the limitations, risks, security and privacy concerns of performing an evaluation and management service by telephone and the availability of in person appointments. The patient expressed understanding and agreed to proceed. ? ?Interactive audio and video telecommunications were attempted between this nurse and patient, however failed, due to patient having technical difficulties OR patient did not have access to video capability.  We continued and completed visit with audio only. ? ?Some vital signs may be absent or patient reported.  ? ?Clemetine Marker, LPN ? ? ?Review of Systems    ? ?Cardiac Risk Factors include: advanced age (>35mn, >>70women);diabetes mellitus;dyslipidemia;hypertension ? ?   ?Objective:  ?  ?Today's Vitals  ? 08/15/21 1155  ?PainSc: 4   ? ?There is no height or weight on file to calculate BMI. ? ? ?  08/15/2021  ? 12:02 PM 08/02/2021  ?  9:47 AM 02/10/2021  ?  9:20 AM 02/06/2021  ?  8:46 AM 08/11/2020  ? 11:36 AM 10/10/2019  ?  9:11 AM 05/15/2018  ?  9:27 AM  ?Advanced Directives  ?Does Patient Have a Medical Advance Directive? Yes No Yes No Yes Yes Yes  ?Type of [place]of [place] will    [place] Healthcare Power of [place] will  ?Does patient want to make changes to medical advance directive?      No - Patient declined   ?Copy of [place] Chart? No - copy requested    No - copy requested No - copy requested No - copy requested  ?Would  patient like information on creating a medical advance directive?  No - Patient declined       ? ? ?Current Medications (verified) ?Outpatient Encounter Medications as of 08/15/2021  ?Medication Sig  ? acetaminophen (TYLENOL) 500 MG tablet Take 1,000 mg by mouth 2 (two) times daily. As needed  ? amLODipine (NORVASC) 10 MG tablet Take 1 tablet by mouth once daily  ? Calcium Carbonate-Vit D-Min (CALCIUM 1200 PO) Take 2 each by mouth daily.  ? Cholecalciferol (VITAMIN D) 2000 UNITS CAPS Take 1 capsule (2,000 Units total) by mouth daily.  ? Continuous Blood Gluc Sensor (FREESTYLE LIBRE 2 SENSOR) MISC APPLY 1 SENSOR EVERY 14 DAYS TO MONITOR BLOOD SUGAR  ? gabapentin (NEURONTIN) 100 MG capsule Take 1-3 capsules (100-300 mg total) by mouth at bedtime. Follow written titration schedule.  ? GINKGO BILOBA COMPLEX PO Take 1 capsule by mouth daily.  ? glucose blood (ONETOUCH VERIO) test strip USE 1 STRIP TO CHECK GLUCOSE 4 TIMES DAILY  ? insulin aspart (NOVOLOG) 100 UNIT/ML injection USE A MAX OF 90 UNITS DAILY VIA INSULIN PUMP  ? Insulin Disposable Pump (V-GO 20) KIT 1  ONCE DAILY  ? losartan (COZAAR) 100 MG tablet Take 1 tablet by mouth once daily  ? NON FORMULARY CPAP @@ 9 cm H2O nightly  ? omeprazole (PRILOSEC) 20 MG capsule Take 1 capsule (20 mg total) by mouth 2 (two) times daily before a meal.  ? pravastatin (PRAVACHOL) 20 MG  tablet Take 1 tablet by mouth once daily (Patient taking differently: Take 20 mg by mouth 3 (three) times a week.)  ? Semaglutide,0.25 or 0.5MG/DOS, (OZEMPIC, 0.25 OR 0.5 MG/DOSE,) 2 MG/3ML SOPN Inject 0.22m weekly (Patient not taking: Reported on 08/15/2021)  ? ?No facility-administered encounter medications on file as of 08/15/2021.  ? ? ?Allergies (verified) ?Ace inhibitors, Wheat bran, and Gramineae pollens  ? ?History: ?Past Medical History:  ?Diagnosis Date  ? Allergy   ? Arthritis   ? Cataract   ? Diabetes mellitus without complication [place]   ? Hyperlipidemia   ? Hypertension   ? Motion  sickness   ? planes  ? OSA (obstructive sleep apnea) 08/23/2016  ? CPAP  ? Osteoporosis   ? Presence of dental prosthetic device   ? implants  ? Radiculopathy of lumbosacral region 06/22/2016  ? Right lateral epicondylitis 06/04/2017  ? Right sided sciatica 03/09/2016  ? Sleep apnea   ? ?Past Surgical History:  ?Procedure Laterality Date  ? ABDOMINAL HYSTERECTOMY    ? CATARACT EXTRACTION Right 2020  ? COLONOSCOPY WITH PROPOFOL N/A 10/10/2019  ? Procedure: COLONOSCOPY WITH PROPOFOL;  Surgeon: WLucilla Lame MD;  [place]  Service: Endoscopy;  Laterality: N/A;  ? ESOPHAGOGASTRODUODENOSCOPY (EGD) WITH PROPOFOL N/A 10/10/2019  ? Procedure: ESOPHAGOGASTRODUODENOSCOPY (EGD) WITH PROPOFOL;  Surgeon: WLucilla Lame MD;  [place]  Service: Endoscopy;  Laterality: N/A;  Diabetic - insulin pump and oral meds ?sleep apnea  ? POLYPECTOMY  10/10/2019  ? Procedure: POLYPECTOMY;  Surgeon: WLucilla Lame MD;  [place]  Service: Endoscopy;;  ? ?Family History  ?Problem Relation Age of Onset  ? Breast cancer Other 40  ? Alcohol abuse Father   ? Mental illness Father   ? Stroke Father   ? Diabetes Sister   ? Asthma Brother   ? Diabetes Brother   ? Diabetes Maternal Aunt   ? Heart disease Maternal Aunt   ? Diabetes Maternal Uncle   ? Early death Maternal Uncle   ? Breast cancer Cousin 548 ?     mat cousin  ? ?Social History  ? ?Socioeconomic History  ? Marital status: Married  ?  Spouse name: WTauna Macfarlane ? Number of children: 1  ? Years of education: 237 ? Highest education level: Doctorate  ?Occupational History  ? Not on file  ?Tobacco Use  ? Smoking status: Never  ? Smokeless tobacco: Never  ?Vaping Use  ? Vaping Use: Never used  ?Substance and Sexual Activity  ? Alcohol use: Not Currently  ?  Alcohol/week: 1.0 standard drink  ?  Types: 1 Standard drinks or equivalent per week  ? Drug use: Never  ? Sexual activity: Not Currently  ?  Partners: Male  ?Other Topics Concern  ? Not  on file  ?Social History Narrative  ? Not on file  ? ?Social Determinants of Health  ? ?Financial Resource Strain: Low Risk   ? Difficulty of Paying Living Expenses: Not hard at all  ?Food Insecurity: No Food Insecurity  ? Worried About RCharity fundraiserin the Last Year: Never true  ? Ran Out of Food in the Last Year: Never true  ?Transportation Needs: No Transportation Needs  ? Lack of Transportation (Medical): No  ? Lack of Transportation (Non-Medical): No  ?Physical Activity: Insufficiently Active  ? Days of Exercise per Week: 2 days  ? Minutes of Exercise per Session: 60 min  ?Stress: No Stress Concern Present  ?  Feeling of Stress : Only a little  ?Social Connections: Socially Integrated  ? Frequency of Communication with Friends and Family: More than three times a week  ? Frequency of Social Gatherings with Friends and Family: More than three times a week  ? Attends Religious Services: More than 4 times per year  ? Active Member of Clubs or Organizations: Yes  ? Attends Archivist Meetings: More than 4 times per year  ? Marital Status: Married  ? ? ?Tobacco Counseling ?Counseling given: Not Answered ? ? ?Clinical Intake: ? ?Pre-visit preparation completed: Yes ? ?Pain : 0-10 ?Pain Score: 4  ?Pain Type: Chronic pain ?Pain Location: Hip ?Pain Orientation: Right ?Pain Descriptors / Indicators: Aching, Sore ?Pain Onset: More than a month ago ?Pain Frequency: Intermittent ? ?  ? ?Nutritional Risks: None ?Diabetes: Yes ?CBG done?: No ?Did pt. bring in CBG monitor from home?: No ? ?How often do you need to have someone help you when you read instructions, pamphlets, or other written materials from your doctor or pharmacy?: 1 - Never ? ?Nutrition Risk Assessment: ? ?Has the patient had any N/V/D within the last 2 months?  No  ?Does the patient have any non-healing wounds?  No  ?Has the patient had any unintentional weight loss or weight gain?  No  ? ?Diabetes: ? ?Is the patient diabetic?  Yes  ?If  diabetic, was a CBG obtained today?  No  ?Did the patient bring in their glucometer from home?  No  ?How often do you monitor your CBG's? 2-3 times per day.  ? ?Financial Strains and Diabetes Management: ? ?Are you h

## 2021-08-19 ENCOUNTER — Other Ambulatory Visit: Payer: Self-pay | Admitting: Internal Medicine

## 2021-08-19 ENCOUNTER — Encounter: Payer: HMO | Attending: Endocrinology | Admitting: Skilled Nursing Facility1

## 2021-08-19 ENCOUNTER — Ambulatory Visit (INDEPENDENT_AMBULATORY_CARE_PROVIDER_SITE_OTHER): Payer: HMO | Admitting: Endocrinology

## 2021-08-19 ENCOUNTER — Encounter: Payer: Self-pay | Admitting: Skilled Nursing Facility1

## 2021-08-19 ENCOUNTER — Encounter: Payer: Self-pay | Admitting: Endocrinology

## 2021-08-19 VITALS — BP 142/82 | HR 64 | Ht 62.0 in | Wt 155.0 lb

## 2021-08-19 DIAGNOSIS — E785 Hyperlipidemia, unspecified: Secondary | ICD-10-CM | POA: Diagnosis not present

## 2021-08-19 DIAGNOSIS — Z794 Long term (current) use of insulin: Secondary | ICD-10-CM | POA: Diagnosis not present

## 2021-08-19 DIAGNOSIS — E1165 Type 2 diabetes mellitus with hyperglycemia: Secondary | ICD-10-CM

## 2021-08-19 DIAGNOSIS — E118 Type 2 diabetes mellitus with unspecified complications: Secondary | ICD-10-CM | POA: Diagnosis not present

## 2021-08-19 DIAGNOSIS — I10 Essential (primary) hypertension: Secondary | ICD-10-CM | POA: Diagnosis not present

## 2021-08-19 DIAGNOSIS — E1169 Type 2 diabetes mellitus with other specified complication: Secondary | ICD-10-CM | POA: Diagnosis not present

## 2021-08-19 LAB — BASIC METABOLIC PANEL
BUN: 16 mg/dL (ref 6–23)
CO2: 27 mEq/L (ref 19–32)
Calcium: 10.1 mg/dL (ref 8.4–10.5)
Chloride: 102 mEq/L (ref 96–112)
Creatinine, Ser: 0.67 mg/dL (ref 0.40–1.20)
GFR: 84.6 mL/min (ref >60.00)
Glucose, Bld: 206 mg/dL — ABNORMAL HIGH (ref 70–99)
Potassium: 4.2 mEq/L (ref 3.5–5.1)
Sodium: 136 mEq/L (ref 135–145)

## 2021-08-19 LAB — POCT GLUCOSE (DEVICE FOR HOME USE): Glucose Fasting, POC: 212 mg/dL — AB (ref 70–99)

## 2021-08-19 LAB — LIPID PANEL
Cholesterol: 203 mg/dL — ABNORMAL HIGH (ref 0–200)
HDL: 90.2 mg/dL (ref >39.00)
LDL Cholesterol: 99 mg/dL (ref 0–99)
NonHDL: 112.5
Total CHOL/HDL Ratio: 2
Triglycerides: 69 mg/dL (ref 0.0–149.0)
VLDL: 13.8 mg/dL (ref 0.0–40.0)

## 2021-08-19 LAB — POCT GLYCOSYLATED HEMOGLOBIN (HGB A1C): Hemoglobin A1C: 8 % — AB (ref 4.0–5.6)

## 2021-08-19 NOTE — Progress Notes (Signed)
Patient ID: Melinda Ford, female   DOB: 1946-03-04, 76 y.o.   MRN: [id number]            Reason for Appointment: Follow-up for Type 2 Diabetes   History of Present Illness:          Date of diagnosis of type 2 diabetes mellitus: ?  2004        Background history:   She previously had been on metformin and also Avandia initially Detailed records of her care are not available prior to 2013 or so She was tried on Januvia in 2016 but this was not effective and this was stopped in 08/2015 She was started on insulin in 2/16 with small doses of Lantus insulin Her A1c has been consistently over 8% since about 2013  Recent history:   INSULIN regimen is:   V-go 20 unit pump since 6/38/17, boluses 0-4 clicks with meals     Non-insulin hypoglycemic drugs : Ozempic 0.5  Her A1c is 8   Current management, blood sugar patterns and problems identified: She appears to have much better blood sugar control with adding Ozempic from her last visit  She is not getting tendency to hypoglycemia including symptomatic overnight mostly between 3-7 AM She did not call to report this  Although she sometimes has high blood sugars late at night she does not think she is taking any correction bolus clicks late at night for these readings  She is changing her pump at bedtime daily  Blood sugars after meals are variably controlled with occasional significantly high readings She thinks that some of her high readings late in the evening are from eating ice cream She may not always bolus for extra snacks or dessert   Previously has a tendency to weight gain        Side effects from medications have been: nausea and diarrhea from 1000 MG metformin  Compliance with the medical regimen: fair  Last 2 weeks data from freestyle libre version 2 shows the following interpretation and statistics  Summary of patterns:  Adequacy of monitoring is inadequate with missing data on some days  compared to her last visit  her blood sugar is overall much lower but much better time in range However she has more significant hypoglycemia than before  overnight blood sugars are dropping significantly between midnight-3 AM with periodic blood sugars in the hypoglycemic range although inconsistently and less in the last 3 or 4 days  POSTPRANDIAL readings on an average are not significantly higher after breakfast but periodically go up over 200 after lunch Blood sugars after dinner are generally trending higher but not consistently, usually blood sugars are relatively high at bedtime  Hypoglycemia is mostly overnight as above Blood sugars show only modest variability overall with GV 35 but more so in the evening . CGM use % of time 50  2-week average/GV   Time in range        %  % Time Above 180   % Time above 250   % Time Below 70      PRE-MEAL Fasting Lunch Dinner Bedtime Overall  Glucose range:       Averages:        POST-MEAL PC Breakfast PC Lunch PC Dinner  Glucose range:     Averages:        CGM use % of time 55  2-week average/GV 133  Time in range     77   %  % Time Above  180 17  % Time above 250 1  % Time Below 70 5     PRE-MEAL Fasting Lunch Dinner Bedtime Overall  Glucose range:       Averages: 83  134 178 133   POST-MEAL PC Breakfast PC Lunch PC Dinner  Glucose range:     Averages: 130 159     Self-care:   Meal times are:  Breakfast is at 10 AM, dinner 8, lunch 2-3 pm  Typical meal intake: Breakfast is toast, oatmeal, sometimes bacon, milk   Lunch: Sometimes hot dogs, fish sandwich Evening meal is a protein like chicken, steak, pork chop, corn, greens.           Dietician visit, most recent: 04/2021  CDE visit: 12/2015   Weight history:  Wt Readings from Last 3 Encounters:  08/19/21 155 lb (70.3 kg)  08/02/21 160 lb (72.6 kg)  07/18/21 156 lb (70.8 kg)    Glycemic control:   Lab Results  Component Value Date   HGBA1C 8.0 (A) 08/19/2021   HGBA1C 8.5 (A) 04/14/2021    HGBA1C 7.5 (A) 01/04/2021   Lab Results  Component Value Date   MICROALBUR 0.8 10/01/2020   LDLCALC 111 (H) 10/01/2020   CREATININE 0.75 07/01/2021   Lab Results  Component Value Date   MICRALBCREAT 0.7 10/01/2020    Other active problems: See review of systems   Allergies as of 08/19/2021       Reactions   Ace Inhibitors Cough   Wheat Bran Hives   Gramineae Pollens         Medication List        Accurate as of Aug 19, 2021 11:27 AM. If you have any questions, ask your nurse or doctor.          acetaminophen 500 MG tablet Commonly known as: TYLENOL Take 1,000 mg by mouth 2 (two) times daily. As needed   amLODipine 10 MG tablet Commonly known as: NORVASC Take 1 tablet by mouth once daily   CALCIUM 1200 PO Take 2 each by mouth daily.   FreeStyle Libre 2 Sensor Misc APPLY 1 SENSOR EVERY 14 DAYS TO MONITOR BLOOD SUGAR   gabapentin 100 MG capsule Commonly known as: Neurontin Take 1-3 capsules (100-300 mg total) by mouth at bedtime. Follow written titration schedule.   GINKGO BILOBA COMPLEX PO Take 1 capsule by mouth daily.   insulin aspart 100 UNIT/ML injection Commonly known as: novoLOG USE A MAX OF 90 UNITS DAILY VIA INSULIN PUMP   losartan 100 MG tablet Commonly known as: COZAAR Take 1 tablet by mouth once daily   NON FORMULARY CPAP @@ 9 cm H2O nightly   omeprazole 20 MG capsule Commonly known as: PRILOSEC Take 1 capsule (20 mg total) by mouth 2 (two) times daily before a meal.   OneTouch Verio test strip Generic drug: glucose blood USE 1 STRIP TO CHECK GLUCOSE 4 TIMES DAILY   Ozempic (0.25 or 0.5 MG/DOSE) 2 MG/3ML Sopn Generic drug: Semaglutide(0.25 or 0.5MG/DOS) Inject 0.18m weekly   pravastatin 20 MG tablet Commonly known as: PRAVACHOL Take 1 tablet by mouth once daily What changed: when to take this   V-Go 20 Kit 1  ONCE DAILY   Vitamin D 50 MCG (2000 UT) Caps Take 1 capsule (2,000 Units total) by mouth daily.         Allergies:  Allergies  Allergen Reactions  . Ace Inhibitors Cough  . Wheat Bran Hives  . Gramineae Pollens  Past Medical History:  Diagnosis Date  . Allergy   . Arthritis   . Cataract   . Diabetes mellitus without complication ([place])   . Hyperlipidemia   . Hypertension   . Motion sickness    planes  . OSA (obstructive sleep apnea) 08/23/2016   CPAP  . Osteoporosis   . Presence of dental prosthetic device    implants  . Radiculopathy of lumbosacral region 06/22/2016  . Right lateral epicondylitis 06/04/2017  . Right sided sciatica 03/09/2016  . Sleep apnea     Past Surgical History:  Procedure Laterality Date  . ABDOMINAL HYSTERECTOMY    . CATARACT EXTRACTION Right 2020  . COLONOSCOPY WITH PROPOFOL N/A 10/10/2019   Procedure: COLONOSCOPY WITH PROPOFOL;  Surgeon: Lucilla Lame, MD;  Location: [place];  Service: Endoscopy;  Laterality: N/A;  . ESOPHAGOGASTRODUODENOSCOPY (EGD) WITH PROPOFOL N/A 10/10/2019   Procedure: ESOPHAGOGASTRODUODENOSCOPY (EGD) WITH PROPOFOL;  Surgeon: Lucilla Lame, MD;  Location: [place];  Service: Endoscopy;  Laterality: N/A;  Diabetic - insulin pump and oral meds sleep apnea  . POLYPECTOMY  10/10/2019   Procedure: POLYPECTOMY;  Surgeon: Lucilla Lame, MD;  Location: [place];  Service: Endoscopy;;    Family History  Problem Relation Age of Onset  . Breast cancer Other 40  . Alcohol abuse Father   . Mental illness Father   . Stroke Father   . Diabetes Sister   . Asthma Brother   . Diabetes Brother   . Diabetes Maternal Aunt   . Heart disease Maternal Aunt   . Diabetes Maternal Uncle   . Early death Maternal Uncle   . Breast cancer Cousin 33       mat cousin    Social History:  reports that she has never smoked. She has never used smokeless tobacco. She reports that she does not currently use alcohol after a past usage of about 1.0 standard drink per week. She reports that she does not use drugs.   Review  of Systems  Lipid history: She is currently not taking pravastatin as prescribed with a dose of 20 mg, previously tolerating this 3 days a week without increased muscle aches  Last LDL:  Lab Results  Component Value Date   CHOL 211 (H) 10/01/2020   CHOL 180 02/17/2020   CHOL 202 (H) 10/22/2019   Lab Results  Component Value Date   HDL 87.00 10/01/2020   HDL 74.70 02/17/2020   HDL 80.10 10/22/2019   Lab Results  Component Value Date   LDLCALC 111 (H) 10/01/2020   LDLCALC 92 02/17/2020   LDLCALC 107 (H) 10/22/2019   Lab Results  Component Value Date   TRIG 63.0 10/01/2020   TRIG 65.0 02/17/2020   TRIG 72.0 10/22/2019   Lab Results  Component Value Date   CHOLHDL 2 10/01/2020   CHOLHDL 2 02/17/2020   CHOLHDL 3 10/22/2019   No results found for: LDLDIRECT          Hypertension: on treatment for several years, on amlodipine and losartan   Has been followed by PCP  BP Readings from Last 3 Encounters:  08/19/21 (!) 142/82  08/02/21 (!) 163/68  07/18/21 (!) 146/86     Most recent foot exam: 12/2019  OSTEOPOROSIS: Followed by PCP, lowest T score was- 2.6 at the spine compared to -3.0 before She was previously taking alendronate Also has had longstanding vitamin D deficiency  Has been taking 4000 units of vitamin D3   Lab Results  Component  Value Date   VD25OH 34.0 12/28/2020   VD25OH 40.70 02/17/2020   VD25OH 28.44 (L) 10/22/2019   VD25OH 29.1 (L) 04/20/2017   VD25OH 24.92 (L) 05/18/2016   VD25OH 31.3 07/28/2015   VD25OH 26.9 (L) 03/17/2015   VD25OH 21.5 (L) 09/17/2014      Physical Examination:  BP (!) 142/82   Pulse 64   Ht 5' 2" (1.575 m)   Wt 155 lb (70.3 kg)   SpO2 98%   BMI 28.35 kg/m        ASSESSMENT:  Diabetes type 2 on insulin  See history of present illness for detailed discussion of current diabetes management, blood sugar patterns and problems identified  She is currently on insulin with the V-go pump and now Ozempic  0.5  A1c is last 8.5 Recent labs not available  She has had dramatic improvement in her blood sugars with adding Ozempic with overall average blood sugar 133 and 77% within the target range She is also getting hypoglycemic overnight but this is mostly related to her basal rate Compliance with her mealtime and snack boluses is not consistent as above As discussed before she is stating that she is not bolusing extra when she has high readings at bedtime Weight is improving with slightly decreased overall intake  Not able to exercise  HYPERTENSION: Has variable control, continue follow-up with PCP  LIPIDS: Needs follow-up    PLAN:   She will check her blood sugars with her sensor consistently 3-4 times a day to help with establishing a consistent pattern To avoid low sugars overnight she will DISCONNECT her pump overnight as a trial.  She will make sure she is bolusing an additional 1-2 clicks for any dessert or snacks and likely needs at least 1 more click at dinnertime for usual meals  Avoid bolusing at bedtime even if blood sugars are around 200 Discussed blood sugar targets after meals Discussed day-to-day management of mealtime control  Ozempic will be increased to 1 mg when she finishes her current prescription, this will allow [place] to reduce her insulin dependence further although not clear if this will be adequate for her postprandial control, she is reluctant to consider multiple injections otherwise A1c on the next visit     Patient Instructions  Restart pump on waking up       Elayne Snare 08/19/2021, 11:27 AM   Note: This office note was prepared with Dragon voice recognition system technology. Any transcriptional errors that result from this process are unintentional.

## 2021-08-19 NOTE — Patient Instructions (Signed)
Restart pump on waking up

## 2021-08-19 NOTE — Telephone Encounter (Signed)
Requested Prescriptions  Pending Prescriptions Disp Refills  . Continuous Blood Gluc Sensor (FREESTYLE LIBRE 2 SENSOR) MISC [Pharmacy Med Name: FREESTYLE LIBRE 2 SENSOR KIT] 2 each 0    Sig: APPLY 1 SENSOR EVERY 14 DAYS TO MONITOR BLOOD SUGAR     Endocrinology: Diabetes - Testing Supplies Passed - 08/19/2021  2:20 PM      Passed - Valid encounter within last 12 months    Recent Outpatient Visits          1 month ago Pharyngitis, unspecified etiology   [facility] Glean Hess, MD   1 month ago Upper respiratory tract infection, unspecified type   [facility] Glean Hess, MD   5 months ago Cervical spondylosis with radiculopathy   [facility] Montel Culver, MD   6 months ago Cervical spondylosis with radiculopathy   [facility] Montel Culver, MD   6 months ago Cervical spondylosis with radiculopathy   [facility] Montel Culver, MD

## 2021-08-19 NOTE — Progress Notes (Addendum)
Diabetes Self-Management Education    08/19/2021  Melinda Ford, identified by name and date of birth, is a 76 y.o. female with a diagnosis of Diabetes:  .   ASSESSMENT Patient is here with her supportive husband.  She was last seen by this RD on 06/16/2021   Pt states she is going to remove the Vgo at night to reduce episodes of low blood sugars.  Pt states her son mad her a delicious mothers day meal.  Pt states she is not big on eggs but may have an apple in the morning not wnating to sit and relax in the morning due to having so much to do.  Pt states she Goes to gym for a aerobics and anaerobic 2 times a week 45 minutes maybe wants to walk in the park 1 day a week with her husband.    Most of today's visit spent in working as a group Engineer, structural, husband, pt) to implement useful strategies for her not to skip breakfast and not to eat in the middle of the night.   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  The pt taking a few minutes each day for stress management was discussed.    24 hr recall: Wakes around 7-7:30am Breakfast: 9-10am: oatmeal + salt and butter or pack of nabs or skipped  Snack: Lunch: [place] or ham or tomato wheat sandwich salad dressing or salad or sometimes with peas ham, egg, lettuce, fruit Snack: Dinner 8-9pm: fish and greens or chicken  + peas or green beans or rice  Snack: snacking   Beverages: water + flavoring, diet sugar free drinks, plain water most often  Goals: -try the microwavable brown rice and if you are still not a fan you can mix it with  white rice   -try plant based bacon   -Ask for help with your estate stuff as a strategy to reduce stress  -find something you enjoy to focus on for a few minutes each day   History includes Type 2 diabete,s HTN, HLD, OSA - "should wear c-pap" Medications include Novolg via a Vgo 20 (4 clicks per meal and forgets at the start of the meal at times).   Labs include:  A1C 8.0% 08/19/2021, 7.5 01/04/2021, 7.6% 10/01/2020, vitamin D 34 12/28/2020, GFR 66 10/01/20, Cholesterol 211, HDL 87, LDL 111, Triglycerides 63 (10/01/2020) CGM:  Libre2  Weight hx: 157 lbs 06/16/2021 167 lbs 04/14/2021 161 lbs 02/10/2021 166 lbs 01/04/2021 150 lbs prior to covid   Patient lives with her husband.  Their son is there currently as well.  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     Individualized Plan for Diabetes Self-Management Training:   Learning Objective:  Patient will have a greater understanding of diabetes self-management. Patient education plan is to attend individual and/or group sessions per assessed needs and concerns.   If problems or questions, patient to contact team via:  Phone  Future DSME appointment:   2 months

## 2021-08-20 MED ORDER — OZEMPIC (1 MG/DOSE) 4 MG/3ML ~~LOC~~ SOPN: PEN_INJECTOR | SUBCUTANEOUS | 3 refills | Status: DC

## 2021-08-23 ENCOUNTER — Ambulatory Visit: Payer: HMO | Admitting: Endocrinology

## 2021-09-01 ENCOUNTER — Telehealth: Payer: Self-pay | Admitting: Pharmacy Technician

## 2021-09-01 ENCOUNTER — Other Ambulatory Visit (HOSPITAL_COMMUNITY): Payer: Self-pay

## 2021-09-01 NOTE — Telephone Encounter (Signed)
Patient Advocate Encounter   Received notification from CoverMyMeds that prior authorization for Ozempic '1mg'$ /dose is required by his/her insurance [employer].   PA submitted on 09/01/21 Key [id number]  Patient Advocate Encounter  Prior Authorization has been approved.    PA# no PA # given.  Rx #: [id number] Effective dates: 09/01/21 through 09/01/22  Per Test Claim Patients co-pay is $228.44 for 1 box.

## 2021-09-04 ENCOUNTER — Other Ambulatory Visit: Payer: Self-pay | Admitting: Internal Medicine

## 2021-09-05 ENCOUNTER — Other Ambulatory Visit: Payer: Self-pay

## 2021-09-05 DIAGNOSIS — E1165 Type 2 diabetes mellitus with hyperglycemia: Secondary | ICD-10-CM

## 2021-09-05 MED ORDER — OZEMPIC (1 MG/DOSE) 4 MG/3ML ~~LOC~~ SOPN: PEN_INJECTOR | SUBCUTANEOUS | 1 refills | Status: DC

## 2021-09-05 NOTE — Telephone Encounter (Signed)
Requested medication (s) are due for refill today: yes  Requested medication (s) are on the active medication list: yes  Last refill:  08/19/21 #2 each 0 refills  Future visit scheduled: no   Notes to clinic:  do you want to refill Rx?      Requested Prescriptions  Pending Prescriptions Disp Refills   Continuous Blood Gluc Sensor (FREESTYLE LIBRE 2 SENSOR) MISC [Pharmacy Med Name: FREESTYLE LIBRE 2 SENSOR KIT] 2 each 0    Sig: APPLY 1 SENSOR EVERY 14 DAYS TO MONITOR BLOOD SUGAR     Endocrinology: Diabetes - Testing Supplies Passed - 09/04/2021  3:39 PM      Passed - Valid encounter within last 12 months    Recent Outpatient Visits           1 month ago Pharyngitis, unspecified etiology   [facility] Glean Hess, MD   2 months ago Upper respiratory tract infection, unspecified type   [facility] Glean Hess, MD   5 months ago Cervical spondylosis with radiculopathy   [facility] Montel Culver, MD   6 months ago Cervical spondylosis with radiculopathy   [facility] Montel Culver, MD   6 months ago Cervical spondylosis with radiculopathy   [facility] Montel Culver, MD

## 2021-09-06 NOTE — Telephone Encounter (Signed)
Called and spoke with patient and she will get the 1 box and dial half way

## 2021-09-08 ENCOUNTER — Encounter: Payer: Self-pay | Admitting: Endocrinology

## 2021-09-09 ENCOUNTER — Encounter: Payer: Self-pay | Admitting: Endocrinology

## 2021-09-15 ENCOUNTER — Ambulatory Visit: Payer: HMO

## 2021-09-20 ENCOUNTER — Ambulatory Visit
Admission: RE | Admit: 2021-09-20 | Discharge: 2021-09-20 | Disposition: A | Discharge status: Home / Self Care | Payer: HMO | Source: Ambulatory Visit | Attending: Internal Medicine | Admitting: Internal Medicine

## 2021-09-20 ENCOUNTER — Encounter: Payer: Self-pay | Admitting: Internal Medicine

## 2021-09-20 ENCOUNTER — Ambulatory Visit (INDEPENDENT_AMBULATORY_CARE_PROVIDER_SITE_OTHER): Payer: HMO | Admitting: Internal Medicine

## 2021-09-20 VITALS — BP 136/86 | HR 64 | Ht 62.0 in | Wt 156.0 lb

## 2021-09-20 DIAGNOSIS — Z1231 Encounter for screening mammogram for malignant neoplasm of breast: Secondary | ICD-10-CM | POA: Insufficient documentation

## 2021-09-20 DIAGNOSIS — B353 Tinea pedis: Secondary | ICD-10-CM

## 2021-09-20 DIAGNOSIS — R21 Rash and other nonspecific skin eruption: Secondary | ICD-10-CM

## 2021-09-20 DIAGNOSIS — B351 Tinea unguium: Secondary | ICD-10-CM

## 2021-09-20 MED ORDER — CLOTRIMAZOLE-BETAMETHASONE 1-0.05 % EX CREA: 1.0000 | TOPICAL_CREAM | Freq: Every day | CUTANEOUS | 1 refills | Status: DC

## 2021-09-20 MED ORDER — TRIAMCINOLONE ACETONIDE 0.5 % EX CREA: 1.0000 | TOPICAL_CREAM | Freq: Three times a day (TID) | CUTANEOUS | 0 refills | Status: AC

## 2021-09-20 NOTE — Progress Notes (Signed)
Date:  09/20/2021   Name:  Melinda Ford   DOB:  January 04, 1946   MRN:  [id number]   Chief Complaint: Pruritis (Itching all around her neck and sometimes whelps up. She is also itching on right ankle. She sometimes takes allergy pills and this helps but this time it did not help. )  Rash This is a recurrent problem. The affected locations include the neck. The rash is characterized by redness and itchiness. She was exposed to nothing. Pertinent negatives include no eye pain, facial edema, fatigue, fever or shortness of breath. Past treatments include moisturizer. The treatment provided mild relief.   Also rash under both breasts with redness and heat.  Dark flat lesions bothersome as well.  Chronic thick nails trimmed by podiatry.  Scaling itching rash on sides of both feet.  Lab Results  Component Value Date   NA 136 08/19/2021   K 4.2 08/19/2021   CO2 27 08/19/2021   GLUCOSE 206 (H) 08/19/2021   BUN 16 08/19/2021   CREATININE 0.67 08/19/2021   CALCIUM 10.1 08/19/2021   EGFR 82 07/01/2021   GFRNONAA 98 09/10/2015   Lab Results  Component Value Date   CHOL 203 (H) 08/19/2021   HDL 90.20 08/19/2021   LDLCALC 99 08/19/2021   TRIG 69.0 08/19/2021   CHOLHDL 2 08/19/2021   Lab Results  Component Value Date   TSH 1.650 12/28/2020   Lab Results  Component Value Date   HGBA1C 8.0 (A) 08/19/2021   Lab Results  Component Value Date   WBC 5.2 07/01/2021   HGB 14.5 07/01/2021   HCT 44.4 07/01/2021   MCV 93 07/01/2021   PLT 284 07/01/2021   Lab Results  Component Value Date   ALT 21 07/01/2021   AST 23 07/01/2021   ALKPHOS 102 07/01/2021   BILITOT 0.4 07/01/2021   Lab Results  Component Value Date   VD25OH 34.0 12/28/2020     Review of Systems  Constitutional:  Negative for chills, fatigue, fever and unexpected weight change.  HENT:  Negative for trouble swallowing.   Eyes:  Negative for pain.  Respiratory:  Negative for shortness of breath and wheezing.    Cardiovascular:  Negative for chest pain and palpitations.  Skin:  Positive for color change and rash.  Neurological:  Negative for dizziness, light-headedness and headaches.  Psychiatric/Behavioral:  Negative for dysphoric mood and sleep disturbance. The patient is not nervous/anxious.     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   Chronic pain syndrome 03/17/2021   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/2021   Cervical paraspinal muscle spasm 02/10/2021   Gastroesophageal reflux disease 12/28/2020   Personal history of colonic polyps    Polyp of sigmoid colon    Abdominal pain, epigastric    Patient noncompliant with statin medication 09/12/2019   Type II diabetes mellitus with complication ([place]) 39/06/90   Hyperlipidemia associated with type 2 diabetes mellitus ([place]) 05/17/2018   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 02/22/2018   Chronic midline thoracic back pain 02/22/2018   Tinea versicolor 09/20/2016   OSA (obstructive sleep apnea) 08/23/2016   Allergic rhinitis 07/13/2016   Overweight (BMI 25.0-29.9) 10/01/2015   Vitamin D deficiency 09/18/2014   Onychomycosis 09/16/2014   Degenerative disc disease, lumbar 09/16/2014   Benign essential HTN 10/10/2010   Osteoporosis 10/10/2010    Allergies  Allergen Reactions  Ace Inhibitors Cough   Wheat Bran Hives   Gramineae Pollens     Past Surgical History:  Procedure Laterality Date   ABDOMINAL HYSTERECTOMY     CATARACT EXTRACTION Right 2020   COLONOSCOPY WITH PROPOFOL N/A 10/10/2019   Procedure: COLONOSCOPY WITH PROPOFOL;  Surgeon: Lucilla Lame, MD;  Location: [place];  Service: Endoscopy;  Laterality: N/A;   ESOPHAGOGASTRODUODENOSCOPY (EGD) WITH PROPOFOL N/A 10/10/2019   Procedure: ESOPHAGOGASTRODUODENOSCOPY (EGD) WITH PROPOFOL;  Surgeon: Lucilla Lame, MD;  Location: [place];  Service: Endoscopy;  Laterality: N/A;  Diabetic - insulin pump and oral meds sleep apnea   POLYPECTOMY  10/10/2019   Procedure: POLYPECTOMY;  Surgeon: Lucilla Lame, MD;  Location: [place];  Service: Endoscopy;;    Social History   Tobacco Use   Smoking status: Never   Smokeless tobacco: Never  Vaping Use   Vaping Use: Never used  Substance Use Topics   Alcohol use: Not Currently    Alcohol/week: 1.0 standard drink of alcohol    Types: 1 Standard drinks or equivalent per week   Drug use: Never     Medication list has been reviewed and updated.  Current Meds  Medication Sig   acetaminophen (TYLENOL) 500 MG tablet Take 1,000 mg by mouth 2 (two) times daily. As needed   amLODipine (NORVASC) 10 MG tablet Take 1 tablet by mouth once daily   Calcium Carbonate-Vit D-Min (CALCIUM 1200 PO) Take 2 each by mouth daily.   Cholecalciferol (VITAMIN D) 2000 UNITS CAPS Take 1 capsule (2,000 Units total) by mouth daily.   clotrimazole-betamethasone (LOTRISONE) cream Apply 1 Application topically daily. To feet and rash under breasts   Continuous Blood Gluc Sensor (FREESTYLE LIBRE 2 SENSOR) MISC APPLY 1 SENSOR EVERY 14 DAYS TO MONITOR BLOOD SUGAR   gabapentin (NEURONTIN) 100 MG capsule Take 1-3 capsules (100-300 mg total) by mouth at bedtime. Follow written titration schedule.   GINKGO BILOBA COMPLEX PO Take 1 capsule by mouth daily.   glucose blood (ONETOUCH VERIO) test strip USE 1 STRIP TO CHECK GLUCOSE 4 TIMES DAILY   insulin aspart (NOVOLOG) 100 UNIT/ML injection USE A MAX OF 90 UNITS DAILY VIA INSULIN PUMP   Insulin Disposable Pump (V-GO 20) KIT 1  ONCE DAILY   losartan (COZAAR) 100 MG tablet Take 1 tablet by mouth once daily   NON FORMULARY CPAP @@ 9 cm H2O nightly   omeprazole (PRILOSEC) 20 MG capsule Take 1 capsule (20 mg total) by mouth 2 (two) times daily before a meal.   pravastatin (PRAVACHOL) 20 MG tablet Take 1 tablet by mouth once daily (Patient taking  differently: Take 20 mg by mouth 3 (three) times a week.)   Semaglutide, 1 MG/DOSE, (OZEMPIC, 1 MG/DOSE,) 4 MG/3ML SOPN Inject 1 mg weekly   triamcinolone cream (KENALOG) 0.5 % Apply 1 Application topically 3 (three) times daily. To rash on neck       09/20/2021    2:42 PM 07/01/2021   10:56 AM 03/17/2021   11:08 AM 02/17/2021   11:58 AM  GAD 7 : Generalized Anxiety Score  Nervous, Anxious, on Edge 0 0 0 0  Control/stop worrying 1 0 0 0  Worry too much - different things 1 0 0 0  Trouble relaxing 0 0 0 0  Restless 1 0 0 0  Easily annoyed or irritable 0 0 0 0  Afraid - awful might happen 0 0 0 0  Total GAD 7 Score 3 0 0 0  Anxiety  Difficulty Not difficult at all Not difficult at all Not difficult at all Not difficult at all       09/20/2021    2:42 PM  Depression screen PHQ 2/9  Decreased Interest 0  Down, Depressed, Hopeless 0  PHQ - 2 Score 0  Altered sleeping 0  Tired, decreased energy 1  Change in appetite 1  Feeling bad or failure about yourself  0  Trouble concentrating 0  Moving slowly or fidgety/restless 1  Suicidal thoughts 0  PHQ-9 Score 3  Difficult doing work/chores Not difficult at all    BP Readings from Last 3 Encounters:  09/20/21 136/86  08/19/21 (!) 142/82  08/02/21 (!) 163/68    Physical Exam Vitals and nursing note reviewed.  Constitutional:      General: She is not in acute distress.    Appearance: She is well-developed.  HENT:     Head: Normocephalic and atraumatic.  Cardiovascular:     Rate and Rhythm: Normal rate and regular rhythm.  Pulmonary:     Effort: Pulmonary effort is normal. No respiratory distress.     Breath sounds: No wheezing or rhonchi.  Musculoskeletal:     Cervical back: Normal range of motion. No tenderness.  Lymphadenopathy:     Cervical: No cervical adenopathy.  Skin:    General: Skin is warm and dry.     Findings: No rash.     Comments: Skin of neck is normal - mild skin thickening from previous rash and  scratching. Under both breasts - flat keratoses and mild erythema c/w tinea.  No ulcerations or drainage. Feet - thick nails on all toes; dry scaling skin of sides and heels of both feet  Neurological:     Mental Status: She is alert and oriented to person, place, and time.  Psychiatric:        Mood and Affect: Mood normal.        Behavior: Behavior normal.     Wt Readings from Last 3 Encounters:  09/20/21 156 lb (70.8 kg)  08/19/21 155 lb (70.3 kg)  08/02/21 160 lb (72.6 kg)    BP 136/86 (BP Location: Right Arm, Cuff Size: Large)   Pulse 64   Ht 5' 2"  (1.575 m)   Wt 156 lb (70.8 kg)   SpO2 98%   BMI 28.53 kg/m   Assessment and Plan: 1. Tinea pedis of both feet Continue podiatry care; moisturizers Lotrisone daily. Can also use under both breasts. - clotrimazole-betamethasone (LOTRISONE) cream; Apply 1 Application topically daily. To feet and rash under breasts  Dispense: 45 g; Refill: 1  2. Onychomycosis Continue nail care with Podiatry  3. Rash and nonspecific skin eruption Around the neck - recommend kenalog without anti-fungal. - triamcinolone cream (KENALOG) 0.5 %; Apply 1 Application topically 3 (three) times daily. To rash on neck  Dispense: 30 g; Refill: 0   Partially dictated using Editor, commissioning. Any errors are unintentional.  Halina Maidens, MD [facility]  09/20/2021

## 2021-09-21 ENCOUNTER — Other Ambulatory Visit: Payer: Self-pay | Admitting: Internal Medicine

## 2021-09-21 NOTE — Telephone Encounter (Signed)
Patient called in states she is having issues with dialing up to 0.'5mg'$  she will stop by office tomorrrow so that I can show her how to.

## 2021-09-22 NOTE — Telephone Encounter (Signed)
Requested Prescriptions  Pending Prescriptions Disp Refills  . Continuous Blood Gluc Sensor (FREESTYLE LIBRE 2 SENSOR) MISC [Pharmacy Med Name: FREESTYLE LIBRE 2 SENSOR KIT] 2 each 1    Sig: APPLY 1 SENSOR EVERY 14 DAYS TO MONITOR BLOOD SUGAR     Endocrinology: Diabetes - Testing Supplies Passed - 09/21/2021  5:21 PM      Passed - Valid encounter within last 12 months    Recent Outpatient Visits          2 days ago Tinea pedis of both feet   [facility] Glean Hess, MD   2 months ago Pharyngitis, unspecified etiology   [facility] Glean Hess, MD   2 months ago Upper respiratory tract infection, unspecified type   [facility] Glean Hess, MD   6 months ago Cervical spondylosis with radiculopathy   [facility] Montel Culver, MD   7 months ago Cervical spondylosis with radiculopathy   [facility] Montel Culver, MD

## 2021-09-30 DIAGNOSIS — B353 Tinea pedis: Secondary | ICD-10-CM | POA: Diagnosis not present

## 2021-09-30 DIAGNOSIS — M19071 Primary osteoarthritis, right ankle and foot: Secondary | ICD-10-CM | POA: Diagnosis not present

## 2021-09-30 DIAGNOSIS — M79674 Pain in right toe(s): Secondary | ICD-10-CM | POA: Diagnosis not present

## 2021-09-30 DIAGNOSIS — B351 Tinea unguium: Secondary | ICD-10-CM | POA: Diagnosis not present

## 2021-09-30 DIAGNOSIS — M79675 Pain in left toe(s): Secondary | ICD-10-CM | POA: Diagnosis not present

## 2021-09-30 LAB — HM DIABETES FOOT EXAM: HM Diabetic Foot Exam: NORMAL

## 2021-10-03 ENCOUNTER — Other Ambulatory Visit: Payer: Self-pay | Admitting: Internal Medicine

## 2021-10-03 DIAGNOSIS — I10 Essential (primary) hypertension: Secondary | ICD-10-CM

## 2021-10-05 ENCOUNTER — Ambulatory Visit: Payer: HMO | Attending: Student in an Organized Health Care Education/Training Program | Admitting: Physical Therapy

## 2021-10-05 ENCOUNTER — Encounter: Payer: Self-pay | Admitting: Physical Therapy

## 2021-10-05 DIAGNOSIS — M5412 Radiculopathy, cervical region: Secondary | ICD-10-CM | POA: Insufficient documentation

## 2021-10-05 DIAGNOSIS — M5413 Radiculopathy, cervicothoracic region: Secondary | ICD-10-CM | POA: Diagnosis not present

## 2021-10-05 DIAGNOSIS — G894 Chronic pain syndrome: Secondary | ICD-10-CM | POA: Diagnosis not present

## 2021-10-05 DIAGNOSIS — M4802 Spinal stenosis, cervical region: Secondary | ICD-10-CM | POA: Diagnosis not present

## 2021-10-05 DIAGNOSIS — M79602 Pain in left arm: Secondary | ICD-10-CM | POA: Diagnosis not present

## 2021-10-05 DIAGNOSIS — M6281 Muscle weakness (generalized): Secondary | ICD-10-CM | POA: Insufficient documentation

## 2021-10-05 NOTE — Therapy (Signed)
OUTPATIENT PHYSICAL THERAPY CERVICAL EVALUATION   Patient Name: Melinda Ford MRN: [id number] DOB:04-05-1945, 76 y.o., female Today's Date: 10/05/2021   PT End of Session - 10/05/21 1207     Visit Number 1    Number of Visits 6    Date for PT Re-Evaluation 11/16/21    Authorization Type IE 10/05/2021    PT Start Time 1205    PT Stop Time 1245    PT Time Calculation (min) 40 min    Activity Tolerance Patient tolerated treatment well    Behavior During Therapy [facility] for tasks assessed/performed             Past Medical History:  Diagnosis Date   Allergy    Arthritis    Cataract    Diabetes mellitus without complication ([place])    Hyperlipidemia    Hypertension    Motion sickness    planes   OSA (obstructive sleep apnea) 08/23/2016   CPAP   Osteoporosis    Presence of dental prosthetic device    implants   Radiculopathy of lumbosacral region 06/22/2016   Right lateral epicondylitis 06/04/2017   Right sided sciatica 03/09/2016   Sleep apnea    Past Surgical History:  Procedure Laterality Date   ABDOMINAL HYSTERECTOMY     CATARACT EXTRACTION Right 2020   COLONOSCOPY WITH PROPOFOL N/A 10/10/2019   Procedure: COLONOSCOPY WITH PROPOFOL;  Surgeon: Lucilla Lame, MD;  Location: [place];  Service: Endoscopy;  Laterality: N/A;   ESOPHAGOGASTRODUODENOSCOPY (EGD) WITH PROPOFOL N/A 10/10/2019   Procedure: ESOPHAGOGASTRODUODENOSCOPY (EGD) WITH PROPOFOL;  Surgeon: Lucilla Lame, MD;  Location: [place];  Service: Endoscopy;  Laterality: N/A;  Diabetic - insulin pump and oral meds sleep apnea   POLYPECTOMY  10/10/2019   Procedure: POLYPECTOMY;  Surgeon: Lucilla Lame, MD;  Location: [place];  Service: Endoscopy;;   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   Chronic pain syndrome 03/17/2021   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/2021    Cervical paraspinal muscle spasm 02/10/2021   Gastroesophageal reflux disease 12/28/2020   Personal history of colonic polyps    Polyp of sigmoid colon    Abdominal pain, epigastric    Patient noncompliant with statin medication 09/12/2019   Type II diabetes mellitus with complication ([place]) 25/95/6387   Hyperlipidemia associated with type 2 diabetes mellitus ([place]) 05/17/2018   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 02/22/2018   Chronic midline thoracic back pain 02/22/2018   Tinea versicolor 09/20/2016   OSA (obstructive sleep apnea) 08/23/2016   Allergic rhinitis 07/13/2016   Overweight (BMI 25.0-29.9) 10/01/2015   Vitamin D deficiency 09/18/2014   Onychomycosis 09/16/2014   Degenerative disc disease, lumbar 09/16/2014   Benign essential HTN 10/10/2010   Osteoporosis 10/10/2010    PCP: Halina Maidens  REFERRING PROVIDER: Gillis Santa  REFERRING DIAG: [phone] (ICD-10-CM) - Foraminal stenosis of cervical region M54.12 (ICD-10-CM) - Cervical radicular pain G89.4 (ICD-10-CM) - Chronic pain syndrome    THERAPY DIAG:  Muscle weakness (generalized)  Radiculopathy, cervicothoracic region  Pain in left arm  Rationale for Evaluation and Treatment Rehabilitation  ONSET DATE: January 31, 2021  SUBJECTIVE:  SUBJECTIVE STATEMENT: Patient states that she returns to PT and has had pain resolution on L with new onset of R pain. Patient reports that the nerve on L side is not well and feels the medication she is on has been helpful. Patient states that with her weakened L hand/arm she is unable to do ADLs such as grip strength, but rarely drops anything. She notes that she has not been able ot relax the 5th digit. Patient notes that when the pain went away she stopped doing  previously assigned HEP. Patient is right handed.  Patient has recently been feeling dizziness with bending forward/standing up. Patient endorses at least 2 falls; and notes unsteadiness overall. Patient does follow with [facility]. Patient does have some difficulty with steps/curb with visual difficulty identifying distance. Patient notes some new onset of difficulty negotiating conversation with increased effort.   PERTINENT HISTORY:  She is having difficulty with left hand grip and holding utensils.  Of note, she has cervical spondylosis with impingement at C4-C5, C3-C4.  She also has an accentuated central cord signal at C4-C5 that could be contributing to her radicular symptoms.  She is currently on gabapentin.  She is interested in learning more about a cervical epidural steroid injection.  We discussed this at length today including the risks and benefits associated with injection.  Patient states that she will think about this further and let us know if she would like to proceed.    PAIN:  Are you having pain? Yes: NPRS scale: unable/refuses to give quantification/10 Pain location: L hand Pain description: weak feeling Aggravating factors: typing, computer activities Relieving factors: medication,   PRECAUTIONS: None  WEIGHT BEARING RESTRICTIONS No  FALLS:  Has patient fallen in last 6 months? Yes. Number of falls 2+  OCCUPATION: volunteering  PLOF: Independent  PATIENT GOALS: "get the pinky finger to move"  OBJECTIVE:   DIAGNOSTIC FINDINGS:  1. Cervical spondylosis and degenerative disc disease, causing prominent impingement at C4-5; moderate impingement at C3-4 and C5-6; and mild impingement at C6-7. 2. 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 This can occasionally cause impingement related symptoms. 3. Equivocal accentuated central cord signal at the C4-5 level on sagittal  inversion recovery weighted images but not confirmed on the T2 weighted images, difficult to exclude subtle central myelomalacia.Increased left arm and left hand pain.    Blood pressure, seated 146/69 (R arm) not on medication  PATIENT SURVEYS:  FOTO 53; predicted discharge score 57   COGNITION: Overall cognitive status:  Patient notes some concern for attention most noticed during conversations which require increased effort for word finding and comprehension; feels this may be related to blood glucose.    SENSATION: Light touch: WFL  POSTURE: forward head and increased thoracic kyphosis  PALPATION: No TTP of L hand and wrist.   CERVICAL ROM:   ROM A/PROM (deg) eval  Flexion WFL  Extension WFL  Right lateral flexion WFL  Left lateral flexion WFL  Right rotation WNL  Left rotation 75% of R *   (Blank rows = not tested)  UPPER EXTREMITY ROM:  ROM Right eval Left eval  Shoulder flexion [facility]  Shoulder extension [facility]  Shoulder abduction [facility]  Shoulder adduction [facility]  Shoulder internal rotation [facility]  Shoulder external rotation [facility] WFL  Elbow flexion [facility] WFL  Elbow extension [facility] WFL  Wrist flexion [facility]  Wrist  extension [facility] WFL  Wrist ulnar deviation [facility] WFL  Wrist radial deviation [facility] WFL  Wrist pronation [facility] WFL  Wrist supination [facility] WFL  Finger flexion WFL AROM Absent from 5th digit; PROM full  Finger extension WFL 5th digit resting in abducted and extended position, limited AROM; PROM of 5th digit painful   (Blank rows = not tested)  UPPER EXTREMITY MMT:    RUE LUE  Shoulder Elevation 5 5  Shoulder Flexion 5 5  Shoulder  Extension 5 5  Shoulder Abduction  5 5  Shoulder Adduction  5 5  Shoulder ER  5 5  Shoulder IR  5 5  Elbow Extension 5 5  Elbow Flexion 5 5  Wrist Flexion  5 3  Wrist Extension  5 3  Thumb Extension 5 5  Lumbricals 5 3+     SPECIAL TESTS:  Deferred 2/2 to time constraints  TODAY'S TREATMENT:   [id number]   PATIENT EDUCATION:  Patient educated on prognosis, POC, and provided with HEP including: [id number]. Patient articulated understanding and returned demonstration. Patient will benefit from further education in order to maximize compliance and understanding for long-term therapeutic gains.   HOME EXERCISE PROGRAM: [id number]  ASSESSMENT:  CLINICAL IMPRESSION: Patient is a 76 year old presenting to clinic with chief complaints of L hand weakness with greatest loss in fifth digit. Upon examination, patient demonstrates deficits in L hand and finger strength and AROM concordant with possible ulnar nerve involvement, although it is reassuring that senosry testing today was unremarkable. Of note patient also reporting increasing unsteadiness with 2 recent falls, difficulty with depth perception when descending/ascending stairs/curbs, as well as some mild changes to cognition which make attending to and participating in conversation more demanding. Patient was encouraged to continue to monitor these symptoms and report them to PCP; patient articulated understanding and reported these symptoms may be related to blood glucose or blood pressure. Patient's responses on FOTO outcome measures (53) indicate moderate functional limitations/disability/distress. Patient's progress may be limited due to persistence of complaint with known foraminal stenosis of cervical spine albeit at levels not concordant with symptom presentation; however, patient's motivation is advantageous. Patient will benefit from continued skilled therapeutic intervention to address deficits in L hand strength, AROM, and function in order to increase function and improve overall QOL.   OBJECTIVE IMPAIRMENTS decreased activity tolerance, decreased coordination, decreased endurance, decreased strength, dizziness, impaired UE functional use, improper body mechanics, and postural dysfunction.   ACTIVITY LIMITATIONS carrying, lifting,  bending, bathing, and hygiene/grooming  PARTICIPATION LIMITATIONS: community activity and occupation  PERSONAL FACTORS Age, Past/current experiences, Time since onset of injury/illness/exacerbation, and 3+ comorbidities: arthritis, DM, OSA, HTN, HLD, osteoporosis, cervical spondylosis, foraminal stenosis of cervical region, chronic pain syndrome  are also affecting patient's functional outcome.   REHAB POTENTIAL: Good  CLINICAL DECISION MAKING: Evolving/moderate complexity  EVALUATION COMPLEXITY: Moderate   GOALS: Goals reviewed with patient? Yes  LONG TERM GOALS: Target date: 11/02/2021  Patient will demonstrate independence with HEP in order to maximize therapeutic gains and improve carryover from physical therapy sessions to ADLs in the home and community. Baseline: provided Goal status: INITIAL  2.  Patient will demonstrate improved function as evidenced by a score of 57 on FOTO measure for full participation in activities at home and in the community. Baseline: 53 Goal status: INITIAL  3.  Patient will demonstrate improved strength in L hand as evidenced by 5/5 strength on MMT for improved ability to perform ADLs and participate in computer based tasks  for occupation. Baseline: 3+/5 Goal status: INITIAL    PLAN: PT FREQUENCY: 2x/week  PT DURATION: 6 weeks  PLANNED INTERVENTIONS: Therapeutic exercises, Therapeutic activity, Neuromuscular re-education, Balance training, Gait training, Patient/Family education, Joint mobilization, Vestibular training, Canalith repositioning, Electrical stimulation, Spinal mobilization, Cryotherapy, Moist heat, Taping, and Manual therapy  PLAN FOR NEXT SESSION: ULTT, L hand and wrist strengthening   Myles Gip PT, DPT [id number]  10/05/2021, 12:08 PM

## 2021-10-05 NOTE — Telephone Encounter (Signed)
Requested Prescriptions  Pending Prescriptions Disp Refills  . amLODipine (NORVASC) 10 MG tablet [Pharmacy Med Name: amLODIPine Besylate 10 MG Oral Tablet] 90 tablet 0    Sig: Take 1 tablet by mouth once daily     Cardiovascular: Calcium Channel Blockers 2 Passed - 10/03/2021  2:02 PM      Passed - Last BP in normal range    BP Readings from Last 1 Encounters:  09/20/21 136/86         Passed - Last Heart Rate in normal range    Pulse Readings from Last 1 Encounters:  09/20/21 64         Passed - Valid encounter within last 6 months    Recent Outpatient Visits          2 weeks ago Tinea pedis of both feet   [facility] Glean Hess, MD   2 months ago Pharyngitis, unspecified etiology   [facility] Glean Hess, MD   3 months ago Upper respiratory tract infection, unspecified type   [facility] Glean Hess, MD   6 months ago Cervical spondylosis with radiculopathy   [facility] Montel Culver, MD   7 months ago Cervical spondylosis with radiculopathy   [facility] Montel Culver, MD             . losartan (COZAAR) 100 MG tablet [Pharmacy Med Name: Losartan Potassium 100 MG Oral Tablet] 90 tablet 0    Sig: Take 1 tablet by mouth once daily     Cardiovascular:  Angiotensin Receptor Blockers Passed - 10/03/2021  2:02 PM      Passed - Cr in normal range and within 180 days    Creatinine  Date Value Ref Range Status  11/22/2012 0.78 0.60 - 1.30 mg/dL Final   Creatinine, Ser  Date Value Ref Range Status  08/19/2021 0.67 0.40 - 1.20 mg/dL Final   Creatinine,U  Date Value Ref Range Status  10/01/2020 123.4 mg/dL Final         Passed - K in normal range and within 180 days    Potassium  Date Value Ref Range Status  08/19/2021 4.2 3.5 - 5.1 mEq/L Final  10/04/2012 4.1 3.5 - 5.1 mmol/L Final         Passed - Patient is not pregnant      Passed - Last BP in normal range    BP Readings from Last 1  Encounters:  09/20/21 136/86         Passed - Valid encounter within last 6 months    Recent Outpatient Visits          2 weeks ago Tinea pedis of both feet   [facility] Glean Hess, MD   2 months ago Pharyngitis, unspecified etiology   [facility] Glean Hess, MD   3 months ago Upper respiratory tract infection, unspecified type   [facility] Glean Hess, MD   6 months ago Cervical spondylosis with radiculopathy   [facility] Montel Culver, MD   7 months ago Cervical spondylosis with radiculopathy   [facility] Montel Culver, MD

## 2021-10-10 ENCOUNTER — Ambulatory Visit: Payer: HMO | Admitting: Physical Therapy

## 2021-10-11 NOTE — Telephone Encounter (Signed)
Patient called in states that the ozempic is making her constipated. States she is taking stool softeners currently. She is leaving to go to conference in [place] in a few days and doesn't want to go having that feeling.

## 2021-10-12 ENCOUNTER — Ambulatory Visit: Payer: HMO | Admitting: Physical Therapy

## 2021-10-17 ENCOUNTER — Ambulatory Visit: Payer: HMO | Admitting: Physical Therapy

## 2021-10-19 ENCOUNTER — Encounter: Payer: Self-pay | Admitting: Physical Therapy

## 2021-10-19 ENCOUNTER — Ambulatory Visit: Payer: HMO | Admitting: Physical Therapy

## 2021-10-19 DIAGNOSIS — M6281 Muscle weakness (generalized): Secondary | ICD-10-CM | POA: Diagnosis not present

## 2021-10-19 DIAGNOSIS — M79602 Pain in left arm: Secondary | ICD-10-CM

## 2021-10-19 DIAGNOSIS — M5413 Radiculopathy, cervicothoracic region: Secondary | ICD-10-CM

## 2021-10-19 NOTE — Therapy (Signed)
OUTPATIENT PHYSICAL THERAPY CERVICAL EVALUATION   Patient Name: Melinda Ford MRN: [id number] DOB:1945-05-20, 76 y.o., female Today's Date: 10/19/2021   PT End of Session - 10/19/21 1119     Visit Number 2    Number of Visits 6    Date for PT Re-Evaluation 11/16/21    Authorization Type IE 10/05/2021    PT Start Time 1115    PT Stop Time 1155    PT Time Calculation (min) 40 min    Activity Tolerance Patient tolerated treatment well    Behavior During Therapy [facility] for tasks assessed/performed             Past Medical History:  Diagnosis Date   Allergy    Arthritis    Cataract    Diabetes mellitus without complication ([place])    Hyperlipidemia    Hypertension    Motion sickness    planes   OSA (obstructive sleep apnea) 08/23/2016   CPAP   Osteoporosis    Presence of dental prosthetic device    implants   Radiculopathy of lumbosacral region 06/22/2016   Right lateral epicondylitis 06/04/2017   Right sided sciatica 03/09/2016   Sleep apnea    Past Surgical History:  Procedure Laterality Date   ABDOMINAL HYSTERECTOMY     CATARACT EXTRACTION Right 2020   COLONOSCOPY WITH PROPOFOL N/A 10/10/2019   Procedure: COLONOSCOPY WITH PROPOFOL;  Surgeon: Lucilla Lame, MD;  Location: [place];  Service: Endoscopy;  Laterality: N/A;   ESOPHAGOGASTRODUODENOSCOPY (EGD) WITH PROPOFOL N/A 10/10/2019   Procedure: ESOPHAGOGASTRODUODENOSCOPY (EGD) WITH PROPOFOL;  Surgeon: Lucilla Lame, MD;  Location: [place];  Service: Endoscopy;  Laterality: N/A;  Diabetic - insulin pump and oral meds sleep apnea   POLYPECTOMY  10/10/2019   Procedure: POLYPECTOMY;  Surgeon: Lucilla Lame, MD;  Location: [place];  Service: Endoscopy;;   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   Chronic pain syndrome 03/17/2021   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/2021    Cervical paraspinal muscle spasm 02/10/2021   Gastroesophageal reflux disease 12/28/2020   Personal history of colonic polyps    Polyp of sigmoid colon    Abdominal pain, epigastric    Patient noncompliant with statin medication 09/12/2019   Type II diabetes mellitus with complication ([place]) 62/37/6283   Hyperlipidemia associated with type 2 diabetes mellitus ([place]) 05/17/2018   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 02/22/2018   Chronic midline thoracic back pain 02/22/2018   Tinea versicolor 09/20/2016   OSA (obstructive sleep apnea) 08/23/2016   Allergic rhinitis 07/13/2016   Overweight (BMI 25.0-29.9) 10/01/2015   Vitamin D deficiency 09/18/2014   Onychomycosis 09/16/2014   Degenerative disc disease, lumbar 09/16/2014   Benign essential HTN 10/10/2010   Osteoporosis 10/10/2010    PCP: Halina Maidens  REFERRING PROVIDER: Gillis Santa  REFERRING DIAG: [phone] (ICD-10-CM) - Foraminal stenosis of cervical region M54.12 (ICD-10-CM) - Cervical radicular pain G89.4 (ICD-10-CM) - Chronic pain syndrome    THERAPY DIAG:  Muscle weakness (generalized)  Radiculopathy, cervicothoracic region  Pain in left arm  Rationale for Evaluation and Treatment Rehabilitation  ONSET DATE: January 31, 2021  SUBJECTIVE:  SUBJECTIVE STATEMENT: Patient states that she returns to PT and has had pain resolution on L with new onset of R pain. Patient reports that the nerve on L side is not well and feels the medication she is on has been helpful. Patient states that with her weakened L hand/arm she is unable to do ADLs such as grip strength, but rarely drops anything. She notes that she has not been able ot relax the 5th digit. Patient notes that when the pain went away she stopped doing  previously assigned HEP. Patient is right handed.  Patient has recently been feeling dizziness with bending forward/standing up. Patient endorses at least 2 falls; and notes unsteadiness overall. Patient does follow with [facility]. Patient does have some difficulty with steps/curb with visual difficulty identifying distance. Patient notes some new onset of difficulty negotiating conversation with increased effort.   PERTINENT HISTORY:  She is having difficulty with left hand grip and holding utensils.  Of note, she has cervical spondylosis with impingement at C4-C5, C3-C4.  She also has an accentuated central cord signal at C4-C5 that could be contributing to her radicular symptoms.  She is currently on gabapentin.  She is interested in learning more about a cervical epidural steroid injection.  We discussed this at length today including the risks and benefits associated with injection.  Patient states that she will think about this further and let us know if she would like to proceed.    PAIN:  Are you having pain? Yes: NPRS scale: unable/refuses to give quantification/10 Pain location: L hand Pain description: weak feeling Aggravating factors: typing, computer activities Relieving factors: medication,   PRECAUTIONS: None  WEIGHT BEARING RESTRICTIONS No  FALLS:  Has patient fallen in last 6 months? Yes. Number of falls 2+  OCCUPATION: volunteering  PLOF: Independent  PATIENT GOALS: "get the pinky finger to move"  OBJECTIVE:   DIAGNOSTIC FINDINGS:  1. Cervical spondylosis and degenerative disc disease, causing prominent impingement at C4-5; moderate impingement at C3-4 and C5-6; and mild impingement at C6-7. 2. 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 This can occasionally cause impingement related symptoms. 3. Equivocal accentuated central cord signal at the C4-5 level on sagittal  inversion recovery weighted images but not confirmed on the T2 weighted images, difficult to exclude subtle central myelomalacia.Increased left arm and left hand pain.    Blood pressure, seated 146/69 (R arm) not on medication  PATIENT SURVEYS:  FOTO 53; predicted discharge score 57   COGNITION: Overall cognitive status:  Patient notes some concern for attention most noticed during conversations which require increased effort for word finding and comprehension; feels this may be related to blood glucose.    SENSATION: Light touch: WFL  POSTURE: forward head and increased thoracic kyphosis  PALPATION: No TTP of L hand and wrist.   CERVICAL ROM:   ROM A/PROM (deg) eval  Flexion WFL  Extension WFL  Right lateral flexion WFL  Left lateral flexion WFL  Right rotation WNL  Left rotation 75% of R *   (Blank rows = not tested)  UPPER EXTREMITY ROM:  ROM Right eval Left eval  Shoulder flexion [facility]  Shoulder extension [facility]  Shoulder abduction [facility]  Shoulder adduction [facility]  Shoulder internal rotation [facility]  Shoulder external rotation [facility] WFL  Elbow flexion [facility] WFL  Elbow extension [facility] WFL  Wrist flexion [facility]  Wrist  extension [facility] WFL  Wrist ulnar deviation [facility] WFL  Wrist radial deviation [facility] WFL  Wrist pronation [facility] WFL  Wrist supination [facility] WFL  Finger flexion WFL AROM Absent from 5th digit; PROM full  Finger extension WFL 5th digit resting in abducted and extended position, limited AROM; PROM of 5th digit painful   (Blank rows = not tested)  UPPER EXTREMITY MMT:    RUE LUE  Shoulder Elevation 5 5  Shoulder Flexion 5 5  Shoulder  Extension 5 5  Shoulder Abduction  5 5  Shoulder Adduction  5 5  Shoulder ER  5 5  Shoulder IR  5 5  Elbow Extension 5 5  Elbow Flexion 5 5  Wrist Flexion  5 3  Wrist Extension  5 3  Thumb Extension 5 5  Lumbricals 5 3+      TREATMENT Subjective: Patient notes that she has been doing exercises as she  remembered them and continues to have some deficits in dexterity of L fifth digit but did feel she was better able to hold 3# dumbbell during fitness class this week.  Pain: 0/10  Pre-treatment assessment:  Manual Therapy: Gentle joint mobilizations at the L hand including: carpals, PIP, DIP for improved mobility  Neuromuscular Re-education:   Therapeutic Exercise: Reviewed HEP including: finger adduction isometrics, finger extension, and finger opposition. TheraPutty activities as listed:   Squeeze  Pinch and pull with digits 3-5  Marble picking with thumb and 5th digit  Treatments unbilled:  Post-treatment assessment:  Patient educated throughout session on appropriate technique and form using multi-modal cueing, HEP, and activity modification. Patient articulated understanding and returned demonstration.  Patient Response to interventions:   HOME EXERCISE PROGRAM: [id number]  ASSESSMENT:  CLINICAL IMPRESSION: Patient presents to clinic with excellent motivation to participate in therapy. Patient demonstrates deficits in L hand and finger strength and AROM. Patient able to achieve adequate form on all putty exercises with moderate cueing during today's session and responded positively to active interventions. Patient will benefit from continued skilled therapeutic intervention to address remaining deficits in L hand and finger strength and AROM in order to increase function and improve overall QOL.  OBJECTIVE IMPAIRMENTS decreased activity tolerance, decreased coordination, decreased endurance, decreased strength, dizziness, impaired UE functional use, improper body mechanics, and postural dysfunction.   ACTIVITY LIMITATIONS carrying, lifting, bending, bathing, and hygiene/grooming  PARTICIPATION LIMITATIONS: community activity and occupation  PERSONAL FACTORS Age, Past/current experiences, Time since onset of injury/illness/exacerbation, and 3+ comorbidities: arthritis, DM,  OSA, HTN, HLD, osteoporosis, cervical spondylosis, foraminal stenosis of cervical region, chronic pain syndrome  are also affecting patient's functional outcome.   REHAB POTENTIAL: Good  CLINICAL DECISION MAKING: Evolving/moderate complexity  EVALUATION COMPLEXITY: Moderate   GOALS: Goals reviewed with patient? Yes  LONG TERM GOALS: Target date: 11/02/2021  Patient will demonstrate independence with HEP in order to maximize therapeutic gains and improve carryover from physical therapy sessions to ADLs in the home and community. Baseline: provided Goal status: INITIAL  2.  Patient will demonstrate improved function as evidenced by a score of 57 on FOTO measure for full participation in activities at home and in the community. Baseline: 53 Goal status: INITIAL  3.  Patient will demonstrate improved strength in L hand as evidenced by 5/5 strength on MMT for improved ability to perform ADLs and participate in computer based tasks for occupation. Baseline: 3+/5 Goal status: INITIAL    PLAN: PT FREQUENCY: 2x/week  PT DURATION: 6 weeks  PLANNED INTERVENTIONS: Therapeutic exercises, Therapeutic  activity, Neuromuscular re-education, Balance training, Gait training, Patient/Family education, Joint mobilization, Vestibular training, Canalith repositioning, Electrical stimulation, Spinal mobilization, Cryotherapy, Moist heat, Taping, and Manual therapy  PLAN FOR NEXT SESSION: L hand and wrist strengthening   Myles Gip PT, DPT [id number]  10/19/2021, 11:20 AM

## 2021-10-24 ENCOUNTER — Ambulatory Visit: Payer: HMO | Admitting: Physical Therapy

## 2021-10-24 ENCOUNTER — Other Ambulatory Visit: Payer: Self-pay | Admitting: Student in an Organized Health Care Education/Training Program

## 2021-10-24 ENCOUNTER — Encounter: Payer: Self-pay | Admitting: Physical Therapy

## 2021-10-24 DIAGNOSIS — M79602 Pain in left arm: Secondary | ICD-10-CM

## 2021-10-24 DIAGNOSIS — M4802 Spinal stenosis, cervical region: Secondary | ICD-10-CM

## 2021-10-24 DIAGNOSIS — M6281 Muscle weakness (generalized): Secondary | ICD-10-CM | POA: Diagnosis not present

## 2021-10-24 DIAGNOSIS — M5413 Radiculopathy, cervicothoracic region: Secondary | ICD-10-CM

## 2021-10-24 MED ORDER — GABAPENTIN 100 MG PO CAPS: 100.0000 mg | ORAL_CAPSULE | Freq: Every day | ORAL | 5 refills | Status: DC

## 2021-10-24 NOTE — Therapy (Signed)
OUTPATIENT PHYSICAL THERAPY CERVICAL TREATMENT   Patient Name: Melinda Ford MRN: [id number] DOB:09/02/1945, 76 y.o., female Today's Date: 10/24/2021   PT End of Session - 10/24/21 1125     Visit Number 3    Number of Visits 6    Date for PT Re-Evaluation 11/16/21    Authorization Type IE 10/05/2021    PT Start Time 1125    PT Stop Time 1200    PT Time Calculation (min) 35 min    Activity Tolerance Patient tolerated treatment well    Behavior During Therapy [facility] for tasks assessed/performed             Past Medical History:  Diagnosis Date   Allergy    Arthritis    Cataract    Diabetes mellitus without complication ([place])    Hyperlipidemia    Hypertension    Motion sickness    planes   OSA (obstructive sleep apnea) 08/23/2016   CPAP   Osteoporosis    Presence of dental prosthetic device    implants   Radiculopathy of lumbosacral region 06/22/2016   Right lateral epicondylitis 06/04/2017   Right sided sciatica 03/09/2016   Sleep apnea    Past Surgical History:  Procedure Laterality Date   ABDOMINAL HYSTERECTOMY     CATARACT EXTRACTION Right 2020   COLONOSCOPY WITH PROPOFOL N/A 10/10/2019   Procedure: COLONOSCOPY WITH PROPOFOL;  Surgeon: Lucilla Lame, MD;  Location: [place];  Service: Endoscopy;  Laterality: N/A;   ESOPHAGOGASTRODUODENOSCOPY (EGD) WITH PROPOFOL N/A 10/10/2019   Procedure: ESOPHAGOGASTRODUODENOSCOPY (EGD) WITH PROPOFOL;  Surgeon: Lucilla Lame, MD;  Location: [place];  Service: Endoscopy;  Laterality: N/A;  Diabetic - insulin pump and oral meds sleep apnea   POLYPECTOMY  10/10/2019   Procedure: POLYPECTOMY;  Surgeon: Lucilla Lame, MD;  Location: [place];  Service: Endoscopy;;   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   Chronic pain syndrome 03/17/2021   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/2021    Cervical paraspinal muscle spasm 02/10/2021   Gastroesophageal reflux disease 12/28/2020   Personal history of colonic polyps    Polyp of sigmoid colon    Abdominal pain, epigastric    Patient noncompliant with statin medication 09/12/2019   Type II diabetes mellitus with complication ([place]) 82/99/3716   Hyperlipidemia associated with type 2 diabetes mellitus ([place]) 05/17/2018   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 02/22/2018   Chronic midline thoracic back pain 02/22/2018   Tinea versicolor 09/20/2016   OSA (obstructive sleep apnea) 08/23/2016   Allergic rhinitis 07/13/2016   Overweight (BMI 25.0-29.9) 10/01/2015   Vitamin D deficiency 09/18/2014   Onychomycosis 09/16/2014   Degenerative disc disease, lumbar 09/16/2014   Benign essential HTN 10/10/2010   Osteoporosis 10/10/2010    PCP: Halina Maidens  REFERRING PROVIDER: Gillis Santa  REFERRING DIAG: [phone] (ICD-10-CM) - Foraminal stenosis of cervical region M54.12 (ICD-10-CM) - Cervical radicular pain G89.4 (ICD-10-CM) - Chronic pain syndrome    THERAPY DIAG:  Muscle weakness (generalized)  Radiculopathy, cervicothoracic region  Pain in left arm  Rationale for Evaluation and Treatment Rehabilitation  ONSET DATE: January 31, 2021  SUBJECTIVE:  SUBJECTIVE STATEMENT: Patient states that she returns to PT and has had pain resolution on L with new onset of R pain. Patient reports that the nerve on L side is not well and feels the medication she is on has been helpful. Patient states that with her weakened L hand/arm she is unable to do ADLs such as grip strength, but rarely drops anything. She notes that she has not been able ot relax the 5th digit. Patient notes that when the pain went away she stopped doing  previously assigned HEP. Patient is right handed.  Patient has recently been feeling dizziness with bending forward/standing up. Patient endorses at least 2 falls; and notes unsteadiness overall. Patient does follow with [facility]. Patient does have some difficulty with steps/curb with visual difficulty identifying distance. Patient notes some new onset of difficulty negotiating conversation with increased effort.   PERTINENT HISTORY:  She is having difficulty with left hand grip and holding utensils.  Of note, she has cervical spondylosis with impingement at C4-C5, C3-C4.  She also has an accentuated central cord signal at C4-C5 that could be contributing to her radicular symptoms.  She is currently on gabapentin.  She is interested in learning more about a cervical epidural steroid injection.  We discussed this at length today including the risks and benefits associated with injection.  Patient states that she will think about this further and let us know if she would like to proceed.    PAIN:  Are you having pain? Yes: NPRS scale: unable/refuses to give quantification/10 Pain location: L hand Pain description: weak feeling Aggravating factors: typing, computer activities Relieving factors: medication,   PRECAUTIONS: None  WEIGHT BEARING RESTRICTIONS No  FALLS:  Has patient fallen in last 6 months? Yes. Number of falls 2+  OCCUPATION: volunteering  PLOF: Independent  PATIENT GOALS: "get the pinky finger to move"  OBJECTIVE:   DIAGNOSTIC FINDINGS:  1. Cervical spondylosis and degenerative disc disease, causing prominent impingement at C4-5; moderate impingement at C3-4 and C5-6; and mild impingement at C6-7. 2. 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 This can occasionally cause impingement related symptoms. 3. Equivocal accentuated central cord signal at the C4-5 level on sagittal  inversion recovery weighted images but not confirmed on the T2 weighted images, difficult to exclude subtle central myelomalacia.Increased left arm and left hand pain.    Blood pressure, seated 146/69 (R arm) not on medication  PATIENT SURVEYS:  FOTO 53; predicted discharge score 57   COGNITION: Overall cognitive status:  Patient notes some concern for attention most noticed during conversations which require increased effort for word finding and comprehension; feels this may be related to blood glucose.    SENSATION: Light touch: WFL  POSTURE: forward head and increased thoracic kyphosis  PALPATION: No TTP of L hand and wrist.   CERVICAL ROM:   ROM A/PROM (deg) eval  Flexion WFL  Extension WFL  Right lateral flexion WFL  Left lateral flexion WFL  Right rotation WNL  Left rotation 75% of R *   (Blank rows = not tested)  UPPER EXTREMITY ROM:  ROM Right eval Left eval  Shoulder flexion [facility]  Shoulder extension [facility]  Shoulder abduction [facility]  Shoulder adduction [facility]  Shoulder internal rotation [facility]  Shoulder external rotation [facility] WFL  Elbow flexion [facility] WFL  Elbow extension [facility] WFL  Wrist flexion [facility]  Wrist  extension [facility] WFL  Wrist ulnar deviation [facility] WFL  Wrist radial deviation [facility] WFL  Wrist pronation [facility] WFL  Wrist supination [facility] WFL  Finger flexion WFL AROM Absent from 5th digit; PROM full  Finger extension WFL 5th digit resting in abducted and extended position, limited AROM; PROM of 5th digit painful   (Blank rows = not tested)  UPPER EXTREMITY MMT:    RUE LUE  Shoulder Elevation 5 5  Shoulder Flexion 5 5  Shoulder  Extension 5 5  Shoulder Abduction  5 5  Shoulder Adduction  5 5  Shoulder ER  5 5  Shoulder IR  5 5  Elbow Extension 5 5  Elbow Flexion 5 5  Wrist Flexion  5 3  Wrist Extension  5 3  Thumb Extension 5 5  Lumbricals 5 3+      TREATMENT Subjective: Patient has been working with putty HEP and feels she has to  continue to effort to involve the fifth digit.  Pain: 0/10  Pre-treatment assessment: + L ULTT (median and ulnar nerve), + L Tinel's test at cubital tunnel, TTP at L UT  Manual Therapy: STM and TPR performed to B UT mm to allow for decreased tension and pain and improved posture and function  Neuromuscular Re-education:   Therapeutic Exercise: Reviewed HEP including: finger adduction isometrics, finger extension, and finger opposition. TheraPutty activities as listed:   Squeeze  Pinch and pull with digits 3-5  Marble picking with thumb and 5th digit Wrist strengthening:  Hammer radial deviation with eccentric ulnar deviation   Hammer supination/pronation windshield wipers  Hammer wrist flexion Cervical ROM:  Extension  Flexion  Rotation B  Lateral flexion B  Treatments unbilled:  Post-treatment assessment:  Patient educated throughout session on appropriate technique and form using multi-modal cueing, HEP, and activity modification. Patient articulated understanding and returned demonstration.  Patient Response to interventions:   HOME EXERCISE PROGRAM: [id number]  ASSESSMENT:  CLINICAL IMPRESSION: Patient presents to clinic with excellent motivation to participate in therapy. Patient demonstrates deficits in L hand and finger strength and AROM. Patient with taut band of mm tissue at L UT tender to palpation and with concordant L ulnar nerve pain during today's session and responded positively to active and manual interventions. Patient will benefit from continued skilled therapeutic intervention to address remaining deficits in L hand and finger strength and AROM in order to increase function and improve overall QOL.  OBJECTIVE IMPAIRMENTS decreased activity tolerance, decreased coordination, decreased endurance, decreased strength, dizziness, impaired UE functional use, improper body mechanics, and postural dysfunction.   ACTIVITY LIMITATIONS carrying, lifting, bending,  bathing, and hygiene/grooming  PARTICIPATION LIMITATIONS: community activity and occupation  PERSONAL FACTORS Age, Past/current experiences, Time since onset of injury/illness/exacerbation, and 3+ comorbidities: arthritis, DM, OSA, HTN, HLD, osteoporosis, cervical spondylosis, foraminal stenosis of cervical region, chronic pain syndrome  are also affecting patient's functional outcome.   REHAB POTENTIAL: Good  CLINICAL DECISION MAKING: Evolving/moderate complexity  EVALUATION COMPLEXITY: Moderate   GOALS: Goals reviewed with patient? Yes  LONG TERM GOALS: Target date: 11/02/2021  Patient will demonstrate independence with HEP in order to maximize therapeutic gains and improve carryover from physical therapy sessions to ADLs in the home and community. Baseline: provided Goal status: INITIAL  2.  Patient will demonstrate improved function as evidenced by a score of 57 on FOTO measure for full participation in activities at home and in the community. Baseline: 53 Goal status: INITIAL  3.  Patient will demonstrate improved strength in  L hand as evidenced by 5/5 strength on MMT for improved ability to perform ADLs and participate in computer based tasks for occupation. Baseline: 3+/5 Goal status: INITIAL    PLAN: PT FREQUENCY: 2x/week  PT DURATION: 6 weeks  PLANNED INTERVENTIONS: Therapeutic exercises, Therapeutic activity, Neuromuscular re-education, Balance training, Gait training, Patient/Family education, Joint mobilization, Vestibular training, Canalith repositioning, Electrical stimulation, Spinal mobilization, Cryotherapy, Moist heat, Taping, and Manual therapy  PLAN FOR NEXT SESSION: desk ergonomics, manual UQ, postural re-ed   Myles Gip PT, DPT [phone]  10/24/2021, 11:25 AM

## 2021-10-26 ENCOUNTER — Ambulatory Visit: Payer: HMO | Admitting: Physical Therapy

## 2021-10-26 ENCOUNTER — Encounter: Payer: Self-pay | Admitting: Physical Therapy

## 2021-10-26 DIAGNOSIS — M6281 Muscle weakness (generalized): Secondary | ICD-10-CM | POA: Diagnosis not present

## 2021-10-26 DIAGNOSIS — M79602 Pain in left arm: Secondary | ICD-10-CM

## 2021-10-26 DIAGNOSIS — M5413 Radiculopathy, cervicothoracic region: Secondary | ICD-10-CM

## 2021-10-26 NOTE — Therapy (Signed)
OUTPATIENT PHYSICAL THERAPY CERVICAL TREATMENT   Patient Name: Melinda Ford MRN: [id number] DOB:15-Jul-1945, 76 y.o., female Today's Date: 10/26/2021   PT End of Session - 10/26/21 1114     Visit Number 4    Number of Visits 12    Date for PT Re-Evaluation 11/16/21    Authorization Type IE 10/05/2021    PT Start Time 1115    PT Stop Time 1155    PT Time Calculation (min) 40 min    Activity Tolerance Patient tolerated treatment well    Behavior During Therapy [facility] for tasks assessed/performed             Past Medical History:  Diagnosis Date   Allergy    Arthritis    Cataract    Diabetes mellitus without complication ([place])    Hyperlipidemia    Hypertension    Motion sickness    planes   OSA (obstructive sleep apnea) 08/23/2016   CPAP   Osteoporosis    Presence of dental prosthetic device    implants   Radiculopathy of lumbosacral region 06/22/2016   Right lateral epicondylitis 06/04/2017   Right sided sciatica 03/09/2016   Sleep apnea    Past Surgical History:  Procedure Laterality Date   ABDOMINAL HYSTERECTOMY     CATARACT EXTRACTION Right 2020   COLONOSCOPY WITH PROPOFOL N/A 10/10/2019   Procedure: COLONOSCOPY WITH PROPOFOL;  Surgeon: Lucilla Lame, MD;  Location: [place];  Service: Endoscopy;  Laterality: N/A;   ESOPHAGOGASTRODUODENOSCOPY (EGD) WITH PROPOFOL N/A 10/10/2019   Procedure: ESOPHAGOGASTRODUODENOSCOPY (EGD) WITH PROPOFOL;  Surgeon: Lucilla Lame, MD;  Location: [place];  Service: Endoscopy;  Laterality: N/A;  Diabetic - insulin pump and oral meds sleep apnea   POLYPECTOMY  10/10/2019   Procedure: POLYPECTOMY;  Surgeon: Lucilla Lame, MD;  Location: [place];  Service: Endoscopy;;   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   Chronic pain syndrome 03/17/2021   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/2021    Cervical paraspinal muscle spasm 02/10/2021   Gastroesophageal reflux disease 12/28/2020   Personal history of colonic polyps    Polyp of sigmoid colon    Abdominal pain, epigastric    Patient noncompliant with statin medication 09/12/2019   Type II diabetes mellitus with complication ([place]) 32/44/0102   Hyperlipidemia associated with type 2 diabetes mellitus ([place]) 05/17/2018   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 02/22/2018   Chronic midline thoracic back pain 02/22/2018   Tinea versicolor 09/20/2016   OSA (obstructive sleep apnea) 08/23/2016   Allergic rhinitis 07/13/2016   Overweight (BMI 25.0-29.9) 10/01/2015   Vitamin D deficiency 09/18/2014   Onychomycosis 09/16/2014   Degenerative disc disease, lumbar 09/16/2014   Benign essential HTN 10/10/2010   Osteoporosis 10/10/2010    PCP: Halina Maidens  REFERRING PROVIDER: Gillis Santa  REFERRING DIAG: [phone] (ICD-10-CM) - Foraminal stenosis of cervical region M54.12 (ICD-10-CM) - Cervical radicular pain G89.4 (ICD-10-CM) - Chronic pain syndrome    THERAPY DIAG:  Muscle weakness (generalized)  Radiculopathy, cervicothoracic region  Pain in left arm  Rationale for Evaluation and Treatment Rehabilitation  ONSET DATE: January 31, 2021  SUBJECTIVE:  SUBJECTIVE STATEMENT: Patient states that she returns to PT and has had pain resolution on L with new onset of R pain. Patient reports that the nerve on L side is not well and feels the medication she is on has been helpful. Patient states that with her weakened L hand/arm she is unable to do ADLs such as grip strength, but rarely drops anything. She notes that she has not been able ot relax the 5th digit. Patient notes that when the pain went away she stopped doing  previously assigned HEP. Patient is right handed.  Patient has recently been feeling dizziness with bending forward/standing up. Patient endorses at least 2 falls; and notes unsteadiness overall. Patient does follow with [facility]. Patient does have some difficulty with steps/curb with visual difficulty identifying distance. Patient notes some new onset of difficulty negotiating conversation with increased effort.   PERTINENT HISTORY:  She is having difficulty with left hand grip and holding utensils.  Of note, she has cervical spondylosis with impingement at C4-C5, C3-C4.  She also has an accentuated central cord signal at C4-C5 that could be contributing to her radicular symptoms.  She is currently on gabapentin.  She is interested in learning more about a cervical epidural steroid injection.  We discussed this at length today including the risks and benefits associated with injection.  Patient states that she will think about this further and let us know if she would like to proceed.    PAIN:  Are you having pain? Yes: NPRS scale: unable/refuses to give quantification/10 Pain location: L hand Pain description: weak feeling Aggravating factors: typing, computer activities Relieving factors: medication,   PRECAUTIONS: None  WEIGHT BEARING RESTRICTIONS No  FALLS:  Has patient fallen in last 6 months? Yes. Number of falls 2+  OCCUPATION: volunteering  PLOF: Independent  PATIENT GOALS: "get the pinky finger to move"  OBJECTIVE:   DIAGNOSTIC FINDINGS:  1. Cervical spondylosis and degenerative disc disease, causing prominent impingement at C4-5; moderate impingement at C3-4 and C5-6; and mild impingement at C6-7. 2. 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 This can occasionally cause impingement related symptoms. 3. Equivocal accentuated central cord signal at the C4-5 level on sagittal  inversion recovery weighted images but not confirmed on the T2 weighted images, difficult to exclude subtle central myelomalacia.Increased left arm and left hand pain.    Blood pressure, seated 146/69 (R arm) not on medication  PATIENT SURVEYS:  FOTO 53; predicted discharge score 57   COGNITION: Overall cognitive status:  Patient notes some concern for attention most noticed during conversations which require increased effort for word finding and comprehension; feels this may be related to blood glucose.    SENSATION: Light touch: WFL  POSTURE: forward head and increased thoracic kyphosis  PALPATION: No TTP of L hand and wrist.   CERVICAL ROM:   ROM A/PROM (deg) eval  Flexion WFL  Extension WFL  Right lateral flexion WFL  Left lateral flexion WFL  Right rotation WNL  Left rotation 75% of R *   (Blank rows = not tested)  UPPER EXTREMITY ROM:  ROM Right eval Left eval  Shoulder flexion [facility]  Shoulder extension [facility]  Shoulder abduction [facility]  Shoulder adduction [facility]  Shoulder internal rotation [facility]  Shoulder external rotation [facility] WFL  Elbow flexion [facility] WFL  Elbow extension [facility] WFL  Wrist flexion [facility]  Wrist  extension [facility] WFL  Wrist ulnar deviation [facility] WFL  Wrist radial deviation [facility] WFL  Wrist pronation [facility] WFL  Wrist supination [facility] WFL  Finger flexion WFL AROM Absent from 5th digit; PROM full  Finger extension WFL 5th digit resting in abducted and extended position, limited AROM; PROM of 5th digit painful   (Blank rows = not tested)  UPPER EXTREMITY MMT:    RUE LUE  Shoulder Elevation 5 5  Shoulder Flexion 5 5  Shoulder  Extension 5 5  Shoulder Abduction  5 5  Shoulder Adduction  5 5  Shoulder ER  5 5  Shoulder IR  5 5  Elbow Extension 5 5  Elbow Flexion 5 5  Wrist Flexion  5 3  Wrist Extension  5 3  Thumb Extension 5 5  Lumbricals 5 3+      TREATMENT Subjective: Patient notes that manual interventions were very effective  last session. Patient denies any pain but does feel tightness distally at the hand and 4th and 5th digits.  Pain: 0/10  Pre-treatment assessment: + L ULTT (median and ulnar nerve), + L Tinel's test at cubital tunnel, TTP at L UT  Manual Therapy: STM and TPR performed to B UT mm to allow for decreased tension and pain and improved posture and function Suboccipital release with manual distraction, multiple bouts, for decreased pain and spasm   Neuromuscular Re-education: Postural education with handouts on workstation set-up for decreased UQ mm strain and pain.  Therapeutic Exercise: L UT stretch L levator scap stretch B pec stretch in supine with "field goal" arms Doorway stretch with "field goal" arms  Treatments unbilled:  Post-treatment assessment:  Patient educated throughout session on appropriate technique and form using multi-modal cueing, HEP, and activity modification. Patient articulated understanding and returned demonstration.  Patient Response to interventions:   HOME EXERCISE PROGRAM: [id number]  ASSESSMENT:  CLINICAL IMPRESSION: Patient presents to clinic with excellent motivation to participate in therapy. Patient demonstrates deficits in L hand and finger strength and AROM. Patient with improved ROM proximally after manual interventions during today's session and responded positively to active and manual interventions. Patient still not seeing significant improvement in L fifth digit dexterity, but amenable to referral to OT hand specialist if we are not successful managing it in this setting. Patient will benefit from continued skilled therapeutic intervention to address remaining deficits in L hand and finger strength and AROM in order to increase function and improve overall QOL.  OBJECTIVE IMPAIRMENTS decreased activity tolerance, decreased coordination, decreased endurance, decreased strength, dizziness, impaired UE functional use, improper body mechanics, and  postural dysfunction.   ACTIVITY LIMITATIONS carrying, lifting, bending, bathing, and hygiene/grooming  PARTICIPATION LIMITATIONS: community activity and occupation  PERSONAL FACTORS Age, Past/current experiences, Time since onset of injury/illness/exacerbation, and 3+ comorbidities: arthritis, DM, OSA, HTN, HLD, osteoporosis, cervical spondylosis, foraminal stenosis of cervical region, chronic pain syndrome  are also affecting patient's functional outcome.   REHAB POTENTIAL: Good  CLINICAL DECISION MAKING: Evolving/moderate complexity  EVALUATION COMPLEXITY: Moderate   GOALS: Goals reviewed with patient? Yes  LONG TERM GOALS: Target date: 11/02/2021  Patient will demonstrate independence with HEP in order to maximize therapeutic gains and improve carryover from physical therapy sessions to ADLs in the home and community. Baseline: provided Goal status: INITIAL  2.  Patient will demonstrate improved function as evidenced by a score of 57 on FOTO measure for full participation in activities at home and in the community. Baseline: 53 Goal status: INITIAL  3.  Patient will demonstrate improved strength in L hand as evidenced by 5/5 strength on MMT for improved ability to perform ADLs and participate in computer based tasks for occupation. Baseline: 3+/5 Goal status: INITIAL    PLAN: PT FREQUENCY: 2x/week  PT DURATION: 6 weeks  PLANNED INTERVENTIONS: Therapeutic exercises, Therapeutic activity, Neuromuscular re-education, Balance training, Gait training, Patient/Family education, Joint mobilization, Vestibular training, Canalith repositioning, Electrical stimulation, Spinal mobilization, Cryotherapy, Moist heat, Taping, and Manual therapy  PLAN FOR NEXT SESSION: desk ergonomics, manual UQ, postural re-ed   Myles Gip PT, DPT [phone]  10/26/2021, 11:14 AM

## 2021-10-27 DIAGNOSIS — H6123 Impacted cerumen, bilateral: Secondary | ICD-10-CM | POA: Diagnosis not present

## 2021-10-27 DIAGNOSIS — H902 Conductive hearing loss, unspecified: Secondary | ICD-10-CM | POA: Diagnosis not present

## 2021-10-31 ENCOUNTER — Encounter: Payer: Self-pay | Admitting: Physical Therapy

## 2021-10-31 ENCOUNTER — Other Ambulatory Visit: Payer: Self-pay | Admitting: Student in an Organized Health Care Education/Training Program

## 2021-10-31 ENCOUNTER — Ambulatory Visit: Payer: HMO | Admitting: Physical Therapy

## 2021-10-31 DIAGNOSIS — M5413 Radiculopathy, cervicothoracic region: Secondary | ICD-10-CM

## 2021-10-31 DIAGNOSIS — M6281 Muscle weakness (generalized): Secondary | ICD-10-CM

## 2021-10-31 DIAGNOSIS — G894 Chronic pain syndrome: Secondary | ICD-10-CM

## 2021-10-31 DIAGNOSIS — M4802 Spinal stenosis, cervical region: Secondary | ICD-10-CM

## 2021-10-31 DIAGNOSIS — M79602 Pain in left arm: Secondary | ICD-10-CM

## 2021-10-31 DIAGNOSIS — M5412 Radiculopathy, cervical region: Secondary | ICD-10-CM

## 2021-10-31 NOTE — Therapy (Signed)
OUTPATIENT PHYSICAL THERAPY CERVICAL TREATMENT   Patient Name: Melinda Ford MRN: [id number] DOB:Jul 15, 1945, 76 y.o., female Today's Date: 10/31/2021   PT End of Session - 10/31/21 1117     Visit Number 5    Number of Visits 12    Date for PT Re-Evaluation 11/16/21    Authorization Type IE 10/05/2021    PT Start Time 1115    PT Stop Time 1155    PT Time Calculation (min) 40 min    Activity Tolerance Patient tolerated treatment well    Behavior During Therapy [facility] for tasks assessed/performed             Past Medical History:  Diagnosis Date   Allergy    Arthritis    Cataract    Diabetes mellitus without complication ([place])    Hyperlipidemia    Hypertension    Motion sickness    planes   OSA (obstructive sleep apnea) 08/23/2016   CPAP   Osteoporosis    Presence of dental prosthetic device    implants   Radiculopathy of lumbosacral region 06/22/2016   Right lateral epicondylitis 06/04/2017   Right sided sciatica 03/09/2016   Sleep apnea    Past Surgical History:  Procedure Laterality Date   ABDOMINAL HYSTERECTOMY     CATARACT EXTRACTION Right 2020   COLONOSCOPY WITH PROPOFOL N/A 10/10/2019   Procedure: COLONOSCOPY WITH PROPOFOL;  Surgeon: Lucilla Lame, MD;  Location: [place];  Service: Endoscopy;  Laterality: N/A;   ESOPHAGOGASTRODUODENOSCOPY (EGD) WITH PROPOFOL N/A 10/10/2019   Procedure: ESOPHAGOGASTRODUODENOSCOPY (EGD) WITH PROPOFOL;  Surgeon: Lucilla Lame, MD;  Location: [place];  Service: Endoscopy;  Laterality: N/A;  Diabetic - insulin pump and oral meds sleep apnea   POLYPECTOMY  10/10/2019   Procedure: POLYPECTOMY;  Surgeon: Lucilla Lame, MD;  Location: [place];  Service: Endoscopy;;   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   Chronic pain syndrome 03/17/2021   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/2021    Cervical paraspinal muscle spasm 02/10/2021   Gastroesophageal reflux disease 12/28/2020   Personal history of colonic polyps    Polyp of sigmoid colon    Abdominal pain, epigastric    Patient noncompliant with statin medication 09/12/2019   Type II diabetes mellitus with complication ([place]) 29/57/4734   Hyperlipidemia associated with type 2 diabetes mellitus ([place]) 05/17/2018   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 02/22/2018   Chronic midline thoracic back pain 02/22/2018   Tinea versicolor 09/20/2016   OSA (obstructive sleep apnea) 08/23/2016   Allergic rhinitis 07/13/2016   Overweight (BMI 25.0-29.9) 10/01/2015   Vitamin D deficiency 09/18/2014   Onychomycosis 09/16/2014   Degenerative disc disease, lumbar 09/16/2014   Benign essential HTN 10/10/2010   Osteoporosis 10/10/2010    PCP: Halina Maidens  REFERRING PROVIDER: Gillis Santa  REFERRING DIAG: [phone] (ICD-10-CM) - Foraminal stenosis of cervical region M54.12 (ICD-10-CM) - Cervical radicular pain G89.4 (ICD-10-CM) - Chronic pain syndrome    THERAPY DIAG:  Muscle weakness (generalized)  Radiculopathy, cervicothoracic region  Pain in left arm  Rationale for Evaluation and Treatment Rehabilitation  ONSET DATE: January 31, 2021  SUBJECTIVE:  SUBJECTIVE STATEMENT: Patient states that she returns to PT and has had pain resolution on L with new onset of R pain. Patient reports that the nerve on L side is not well and feels the medication she is on has been helpful. Patient states that with her weakened L hand/arm she is unable to do ADLs such as grip strength, but rarely drops anything. She notes that she has not been able ot relax the 5th digit. Patient notes that when the pain went away she stopped doing  previously assigned HEP. Patient is right handed.  Patient has recently been feeling dizziness with bending forward/standing up. Patient endorses at least 2 falls; and notes unsteadiness overall. Patient does follow with [facility]. Patient does have some difficulty with steps/curb with visual difficulty identifying distance. Patient notes some new onset of difficulty negotiating conversation with increased effort.   PERTINENT HISTORY:  She is having difficulty with left hand grip and holding utensils.  Of note, she has cervical spondylosis with impingement at C4-C5, C3-C4.  She also has an accentuated central cord signal at C4-C5 that could be contributing to her radicular symptoms.  She is currently on gabapentin.  She is interested in learning more about a cervical epidural steroid injection.  We discussed this at length today including the risks and benefits associated with injection.  Patient states that she will think about this further and let us know if she would like to proceed.    PAIN:  Are you having pain? Yes: NPRS scale: unable/refuses to give quantification/10 Pain location: L hand Pain description: weak feeling Aggravating factors: typing, computer activities Relieving factors: medication,   PRECAUTIONS: None  WEIGHT BEARING RESTRICTIONS No  FALLS:  Has patient fallen in last 6 months? Yes. Number of falls 2+  OCCUPATION: volunteering  PLOF: Independent  PATIENT GOALS: "get the pinky finger to move"  OBJECTIVE:   DIAGNOSTIC FINDINGS:  1. Cervical spondylosis and degenerative disc disease, causing prominent impingement at C4-5; moderate impingement at C3-4 and C5-6; and mild impingement at C6-7. 2. 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 This can occasionally cause impingement related symptoms. 3. Equivocal accentuated central cord signal at the C4-5 level on sagittal  inversion recovery weighted images but not confirmed on the T2 weighted images, difficult to exclude subtle central myelomalacia.Increased left arm and left hand pain.    Blood pressure, seated 146/69 (R arm) not on medication  PATIENT SURVEYS:  FOTO 53; predicted discharge score 57   COGNITION: Overall cognitive status:  Patient notes some concern for attention most noticed during conversations which require increased effort for word finding and comprehension; feels this may be related to blood glucose.    SENSATION: Light touch: WFL  POSTURE: forward head and increased thoracic kyphosis  PALPATION: No TTP of L hand and wrist.   CERVICAL ROM:   ROM A/PROM (deg) eval  Flexion WFL  Extension WFL  Right lateral flexion WFL  Left lateral flexion WFL  Right rotation WNL  Left rotation 75% of R *   (Blank rows = not tested)  UPPER EXTREMITY ROM:  ROM Right eval Left eval  Shoulder flexion [facility]  Shoulder extension [facility]  Shoulder abduction [facility]  Shoulder adduction [facility]  Shoulder internal rotation [facility]  Shoulder external rotation [facility] WFL  Elbow flexion [facility] WFL  Elbow extension [facility] WFL  Wrist flexion [facility]  Wrist  extension [facility] WFL  Wrist ulnar deviation [facility] WFL  Wrist radial deviation [facility] WFL  Wrist pronation [facility] WFL  Wrist supination [facility] WFL  Finger flexion WFL AROM Absent from 5th digit; PROM full  Finger extension WFL 5th digit resting in abducted and extended position, limited AROM; PROM of 5th digit painful   (Blank rows = not tested)  UPPER EXTREMITY MMT:    RUE LUE  Shoulder Elevation 5 5  Shoulder Flexion 5 5  Shoulder  Extension 5 5  Shoulder Abduction  5 5  Shoulder Adduction  5 5  Shoulder ER  5 5  Shoulder IR  5 5  Elbow Extension 5 5  Elbow Flexion 5 5  Wrist Flexion  5 3  Wrist Extension  5 3  Thumb Extension 5 5  Lumbricals 5 3+      TREATMENT Subjective: Patient reports that she has continued to work on HEP and has  noticed very mild improvement at L 5th digit. Patient notes that L UT tenderness/tightness has resolved. Patient denies any profound weakness or dropping of objects, but does have conitnued fear of this as she can feel the difference in her grip strength.  Pain: 0/10  Pre-treatment assessment: + L ULTT (median and ulnar nerve), + L Tinel's test at cubital tunnel, TTP at L UT  Manual Therapy: STM and TPR performed to L UT mm to allow for decreased tension and pain and improved posture and function   Neuromuscular Re-education:   Therapeutic Exercise: - Seated Backward Shoulder Rolls  - 10 reps - Seated Cervical Retraction Protraction AROM  - 10 reps - Seated Neck Sidebending ROM  - 10 reps - Cervical AROM Flexion and Rotation  - 10 reps - Seated Cervical Extension AROM  - 10 reps - Seated Scapular Retraction with External Rotation  - 10 reps - Ulnar nerve towel sliders  - 10 reps  Treatments unbilled:  Post-treatment assessment:  Patient educated throughout session on appropriate technique and form using multi-modal cueing, HEP, and activity modification. Patient articulated understanding and returned demonstration.  Patient Response to interventions: Comfortable to discharge from PT and see hand specialist  HOME EXERCISE PROGRAM: [id number]; [id number]  ASSESSMENT:  CLINICAL IMPRESSION: Patient presents to clinic with excellent motivation to participate in therapy. Patient demonstrates deficits in L hand and finger strength and AROM. Patient indicating goal achievement with respect to cervical spine deficits but continues to be distressed/bothered by lack of improvement with fifth digit dexterity and strength during today's session. At this time, patient is not presenting with any significant radiating/radicular symptoms (no numbness, pins/needles, profound weakness), which is reassuring. DPT and patient revisited possibility of referral to OT hand specialist for more specific  treatment of L hand, and patient still amenable. Patient may benefit from continued skilled therapeutic intervention to address remaining deficits in L hand and finger strength and AROM in order to increase function and improve overall QOL.  OBJECTIVE IMPAIRMENTS decreased activity tolerance, decreased coordination, decreased endurance, decreased strength, dizziness, impaired UE functional use, improper body mechanics, and postural dysfunction.   ACTIVITY LIMITATIONS carrying, lifting, bending, bathing, and hygiene/grooming  PARTICIPATION LIMITATIONS: community activity and occupation  PERSONAL FACTORS Age, Past/current experiences, Time since onset of injury/illness/exacerbation, and 3+ comorbidities: arthritis, DM, OSA, HTN, HLD, osteoporosis, cervical spondylosis, foraminal stenosis of cervical region, chronic pain syndrome  are also affecting patient's functional outcome.   REHAB POTENTIAL: Good  CLINICAL DECISION MAKING: Evolving/moderate complexity  EVALUATION COMPLEXITY: Moderate   GOALS: Goals reviewed with  patient? Yes  LONG TERM GOALS: Target date: 11/02/2021  Patient will demonstrate independence with HEP in order to maximize therapeutic gains and improve carryover from physical therapy sessions to ADLs in the home and community. Baseline: provided; 7/31: IND Goal status: MET  2.  Patient will demonstrate improved function as evidenced by a score of 57 on FOTO measure for full participation in activities at home and in the community. Baseline: 53; 7/31: 71 Goal status: MET  3.  Patient will demonstrate improved strength in L hand as evidenced by 5/5 strength on MMT for improved ability to perform ADLs and participate in computer based tasks for occupation. Baseline: 3+/5; 7/31: 3+/5 Goal status: IN PROGRESS    PLAN: PT FREQUENCY: 2x/week  PT DURATION: 6 weeks  PLANNED INTERVENTIONS: Therapeutic exercises, Therapeutic activity, Neuromuscular re-education, Balance  training, Gait training, Patient/Family education, Joint mobilization, Vestibular training, Canalith repositioning, Electrical stimulation, Spinal mobilization, Cryotherapy, Moist heat, Taping, and Manual therapy  PLAN FOR NEXT SESSION:    Myles Gip PT, DPT [phone]  10/31/2021, 11:17 AM

## 2021-11-02 ENCOUNTER — Ambulatory Visit: Payer: HMO | Admitting: Physical Therapy

## 2021-11-04 ENCOUNTER — Other Ambulatory Visit: Payer: Self-pay

## 2021-11-04 DIAGNOSIS — E1165 Type 2 diabetes mellitus with hyperglycemia: Secondary | ICD-10-CM

## 2021-11-04 MED ORDER — PEN NEEDLES 32G X 5 MM MISC: 0 refills | Status: AC

## 2021-11-22 ENCOUNTER — Encounter: Payer: HMO | Attending: Internal Medicine | Admitting: Nutrition

## 2021-11-22 ENCOUNTER — Telehealth: Payer: Self-pay

## 2021-11-22 ENCOUNTER — Ambulatory Visit (INDEPENDENT_AMBULATORY_CARE_PROVIDER_SITE_OTHER): Payer: HMO | Admitting: Endocrinology

## 2021-11-22 ENCOUNTER — Encounter: Payer: Self-pay | Admitting: Endocrinology

## 2021-11-22 VITALS — BP 132/84 | HR 52 | Ht 62.0 in | Wt 154.8 lb

## 2021-11-22 DIAGNOSIS — I10 Essential (primary) hypertension: Secondary | ICD-10-CM

## 2021-11-22 DIAGNOSIS — E118 Type 2 diabetes mellitus with unspecified complications: Secondary | ICD-10-CM | POA: Insufficient documentation

## 2021-11-22 DIAGNOSIS — E1165 Type 2 diabetes mellitus with hyperglycemia: Secondary | ICD-10-CM

## 2021-11-22 DIAGNOSIS — Z794 Long term (current) use of insulin: Secondary | ICD-10-CM

## 2021-11-22 LAB — MICROALBUMIN / CREATININE URINE RATIO
Creatinine,U: 82.7 mg/dL
Microalb Creat Ratio: 1.4 mg/g (ref 0.0–30.0)
Microalb, Ur: 1.1 mg/dL (ref 0.0–1.9)

## 2021-11-22 LAB — POCT GLYCOSYLATED HEMOGLOBIN (HGB A1C): Hemoglobin A1C: 7.9 % — AB (ref 4.0–5.6)

## 2021-11-22 NOTE — Telephone Encounter (Signed)
Called and gave information to patient.

## 2021-11-22 NOTE — Progress Notes (Signed)
Patient is here with her husband to review her eating/meal plan.   Medication:  V-go which she is removing at night, and denies low blood sugars now, which she admits that she was overtreating. Exercise:  Gym 2X /wk for 45 minutes and walks with husband 1X /wk in the AM for 30 minutes.   Weight down 3 pounds since last visit 3 months ago Diet:  appears unchanged since last consult.  Continues to eat bfast at 10AM, and 3PM for lunch and 6PM for supper.  She snacks every night at 9PM.   While she has lost some weight, she is wanting to loose more.  Her meals are high in fat-especially lunch and super with large amounts of salad dressing, ice cream and chips.  Discussed other options for her ranch dressing a suggested she try the [place] yogurt dressing and switch to low fat popcorn , and ice cream, which she admits she can not give up.and other 150 calorie or less snack options given to her.  He 9 PM snack continues to be over 250 calories.

## 2021-11-22 NOTE — Progress Notes (Signed)
Patient ID: Melinda Ford, female   DOB: [birth year], 76 y.o.   MRN: [id number]            Reason for Appointment: Follow-up for Type 2 Diabetes   History of Present Illness:          Date of diagnosis of type 2 diabetes mellitus: ?  2004        Background history:   She previously had been on metformin and also Avandia initially Detailed records of her care are not available prior to 2013 or so She was tried on Januvia in 2016 but this was not effective and this was stopped in 08/2015 She was started on insulin in 2/16 with small doses of Lantus insulin Her A1c has been consistently over 8% since about 2013  Recent history:   INSULIN regimen is:   V-go 20 unit pump since 07/26/93, boluses 0-4 clicks with meals     Non-insulin hypoglycemic drugs : Ozempic 0.5 weekly  Her A1c is 7.8, previously 8 compared to 8.5   Current management, blood sugar patterns and problems identified: She is now checking her blood sugars consistently and that is more complete on her libre sensor, analysis as below She thinks that she has benefited from [place] more recently when she is able to take it more consistently and previously was not able to figure out how to dial halfway on her 1 mg pen Also is getting needle separately to have adequate supplies Previously concerned about the cost of Ozempic but now she thinks she can afford it especially with blood sugars improving Time in range is excellent at 7.2 compared to last time but can be improved after dinner  She is still not consistently bolusing before meals or adjusting the dose based on what she is eating Recently changing her pump in the morning Hypoglycemia has been minimal She says that if the blood sugar is below 150 at bedtime she will remove the pump otherwise continue Her weight is about the same recently She may not always bolus for extra snacks or dessert         Side effects from medications have been: nausea and diarrhea from 1000  MG metformin  Compliance with the medical regimen: fair  Last 2 weeks data from freestyle libre version 2 shows the following interpretation and statistics  Summary of patterns:  Frequency of scanning is improved with 70% active CGM time  Also time in range is improved at 72% compared to 49  Recently not comparing with fingersticks HYPERGLYCEMIC episodes are occurring mostly late afternoon and evenings OVERNIGHT blood sugars are generally the highest around midnight and then progressively decline until about 8 AM with 1 episode of very mild hypoglycemia around 6-8 AM  Blood sugars are rising in the morning hours and then will stay level until 6 PM and then again will start rising after 9 PM  POSTPRANDIAL readings are moderately increased after breakfast, generally level after lunch Postprandial readings after dinner are overall consistently going up but absolute increase is about 45 mg  Hypoglycemia only noticed overnight once and low normal reading after dinner last evening . CGM use % of time   2-week average/GV 151/32  Time in range      72%  % Time Above 180 23+3  % Time above 250   % Time Below 70 2     PRE-MEAL Fasting Lunch Dinner Bedtime Overall  Glucose range:       Averages: 111 168 142  POST-MEAL PC Breakfast PC Lunch PC Dinner  Glucose range:     Averages: 168 178 185     CGM use % of time 50  2-week average/GV 183/37  Time in range     49   %  % Time Above 180 35  % Time above 250 14  % Time Below 70 2    Self-care:   Meal times are:  Breakfast is at 10 AM, dinner 8, lunch 2-3 pm  Typical meal intake: Breakfast is toast, oatmeal, sometimes bacon, milk   Lunch: Sometimes hot dogs, fish sandwich Evening meal is a protein like chicken, steak, pork chop, corn, greens.           Dietician visit, most recent: 04/2021  CDE visit: 12/2015   Weight history:  Wt Readings from Last 3 Encounters:  11/22/21 154 lb 12 oz (70.2 kg)  11/22/21 154 lb 12.8 oz  (70.2 kg)  09/20/21 156 lb (70.8 kg)    Glycemic control:   Lab Results  Component Value Date   HGBA1C 7.9 (A) 11/22/2021   HGBA1C 8.0 (A) 08/19/2021   HGBA1C 8.5 (A) 04/14/2021   Lab Results  Component Value Date   MICROALBUR 1.1 11/22/2021   LDLCALC 99 08/19/2021   CREATININE 0.67 08/19/2021   Lab Results  Component Value Date   MICRALBCREAT 1.4 11/22/2021    Other active problems: See review of systems   Allergies as of 11/22/2021       Reactions   Ace Inhibitors Cough   Wheat Bran Hives   Gramineae Pollens         Medication List        Accurate as of November 22, 2021  8:46 PM. If you have any questions, ask your nurse or doctor.          acetaminophen 500 MG tablet Commonly known as: TYLENOL Take 1,000 mg by mouth 2 (two) times daily. As needed   amLODipine 10 MG tablet Commonly known as: NORVASC Take 1 tablet by mouth once daily   CALCIUM 1200 PO Take 2 each by mouth daily.   clotrimazole-betamethasone cream Commonly known as: LOTRISONE Apply 1 Application topically daily. To feet and rash under breasts   FreeStyle Libre 2 Sensor Misc APPLY 1 SENSOR EVERY 14 DAYS TO MONITOR BLOOD SUGAR   gabapentin 100 MG capsule Commonly known as: Neurontin Take 1-3 capsules (100-300 mg total) by mouth at bedtime. Follow written titration schedule.   GINKGO BILOBA COMPLEX PO Take 1 capsule by mouth daily.   insulin aspart 100 UNIT/ML injection Commonly known as: novoLOG USE A MAX OF 90 UNITS DAILY VIA INSULIN PUMP   losartan 100 MG tablet Commonly known as: COZAAR Take 1 tablet by mouth once daily   NON FORMULARY CPAP @@ 9 cm H2O nightly   omeprazole 20 MG capsule Commonly known as: PRILOSEC Take 1 capsule (20 mg total) by mouth 2 (two) times daily before a meal.   OneTouch Verio test strip Generic drug: glucose blood USE 1 STRIP TO CHECK GLUCOSE 4 TIMES DAILY   Ozempic (1 MG/DOSE) 4 MG/3ML Sopn Generic drug: Semaglutide (1  MG/DOSE) Inject 1 mg weekly   Pen Needles 32G X 5 MM Misc Please   pravastatin 20 MG tablet Commonly known as: PRAVACHOL Take 1 tablet by mouth once daily What changed: when to take this   triamcinolone cream 0.5 % Commonly known as: KENALOG Apply 1 Application topically 3 (three) times daily. To rash on neck  V-Go 20 Kit 1  ONCE DAILY   Vitamin D 50 MCG (2000 UT) Caps Take 1 capsule (2,000 Units total) by mouth daily.        Allergies:  Allergies  Allergen Reactions   Ace Inhibitors Cough   Wheat Bran Hives   Gramineae Pollens     Past Medical History:  Diagnosis Date   Allergy    Arthritis    Cataract    Diabetes mellitus without complication (HCC)    Hyperlipidemia    Hypertension    Motion sickness    planes   OSA (obstructive sleep apnea) 08/23/2016   CPAP   Osteoporosis    Presence of dental prosthetic device    implants   Radiculopathy of lumbosacral region 06/22/2016   Right lateral epicondylitis 06/04/2017   Right sided sciatica 03/09/2016   Sleep apnea     Past Surgical History:  Procedure Laterality Date   ABDOMINAL HYSTERECTOMY     CATARACT EXTRACTION Right 2020   COLONOSCOPY WITH PROPOFOL N/A 10/10/2019   Procedure: COLONOSCOPY WITH PROPOFOL;  Surgeon: Lucilla Lame, MD;  Location: [facility];  Service: Endoscopy;  Laterality: N/A;   ESOPHAGOGASTRODUODENOSCOPY (EGD) WITH PROPOFOL N/A 10/10/2019   Procedure: ESOPHAGOGASTRODUODENOSCOPY (EGD) WITH PROPOFOL;  Surgeon: Lucilla Lame, MD;  Location: [facility];  Service: Endoscopy;  Laterality: N/A;  Diabetic - insulin pump and oral meds sleep apnea   POLYPECTOMY  10/10/2019   Procedure: POLYPECTOMY;  Surgeon: Lucilla Lame, MD;  Location: [facility];  Service: Endoscopy;;    Family History  Problem Relation Age of Onset   Breast cancer Other 37   Alcohol abuse Father    Mental illness Father    Stroke Father    Diabetes Sister    Asthma Brother    Diabetes Brother     Diabetes Maternal Aunt    Heart disease Maternal Aunt    Diabetes Maternal Uncle    Early death Maternal Uncle    Breast cancer Cousin 87       mat cousin    Social History:  reports that she has never smoked. She has never used smokeless tobacco. She reports that she does not currently use alcohol after a past usage of about 1.0 standard drink of alcohol per week. She reports that she does not use drugs.   Review of Systems  Lipid history: She is currently not taking pravastatin as prescribed with a dose of 20 mg, previously tolerating this 3 days a week without increased muscle aches  Last LDL:  Lab Results  Component Value Date   CHOL 203 (H) 08/19/2021   CHOL 211 (H) 10/01/2020   CHOL 180 02/17/2020   Lab Results  Component Value Date   HDL 90.20 08/19/2021   HDL 87.00 10/01/2020   HDL 74.70 02/17/2020   Lab Results  Component Value Date   LDLCALC 99 08/19/2021   LDLCALC 111 (H) 10/01/2020   LDLCALC 92 02/17/2020   Lab Results  Component Value Date   TRIG 69.0 08/19/2021   TRIG 63.0 10/01/2020   TRIG 65.0 02/17/2020   Lab Results  Component Value Date   CHOLHDL 2 08/19/2021   CHOLHDL 2 10/01/2020   CHOLHDL 2 02/17/2020   No results found for: "LDLDIRECT"          Hypertension: on treatment for several years, on amlodipine and losartan   Has been followed by PCP  BP Readings from Last 3 Encounters:  11/22/21 132/84  09/20/21 136/86  08/19/21 Marland Kitchen)  142/82     Most recent foot exam: 12/2019  OSTEOPOROSIS: Followed by PCP, lowest T score was- 2.6 at the spine compared to -3.0 before She was previously taking alendronate Also has had longstanding vitamin D deficiency  Has been taking 4000 units of vitamin D3   Lab Results  Component Value Date   VD25OH 34.0 12/28/2020   VD25OH 40.70 02/17/2020   VD25OH 28.44 (L) 10/22/2019   VD25OH 29.1 (L) 04/20/2017   VD25OH 24.92 (L) 05/18/2016   VD25OH 31.3 07/28/2015   VD25OH 26.9 (L) 03/17/2015   VD25OH  21.5 (L) 09/17/2014      Physical Examination:  BP 132/84   Pulse (!) 52   Ht _0  (1.575 m)   Wt 154 lb 12.8 oz (70.2 kg)   SpO2 97%   BMI 28.31 kg/m        ASSESSMENT:  Diabetes type 2 on insulin  See history of present illness for detailed discussion of current diabetes management, blood sugar patterns and problems identified  She is currently on insulin with the V-go pump and weekly Ozempic 0.5  A1c is 7.9 compared to 8 %, previously 8.5   Although her A1c is not improved her recent GMI is 6.9 She thinks she is more consistent with her Ozempic and this is helping her control Also has done somewhat better with remembering to bolus before meals but this is still not adequate and she still has postprandial hyperglycemia after dinner or evening snacks  She does do better with taking of her pump at bedtime if blood sugars are not high   HYPERTENSION: Has good control with high normal diastolic  Needs urine microalbumin checked today as she did not do it last time   PLAN:   Today discussed how to adjust her boluses based on portions and carbohydrates and also explained to her what constitutes carbohydrates She also was reminded to try and remember to bolus before eating She can reduce her boluses to 1 or 2 clicks if she is eating a very low carbohydrate meal Also may bolus additional 1 or 2 clicks if she is underestimating her bolus or eating a snack after dinner Check blood sugars consistently at all times Encouraged her to be more active with walking No change in Ozempic    There are no Patient Instructions on file for this visit.       Elayne Snare 11/22/2021, 8:46 PM   Note: This office note was prepared with Dragon voice recognition system technology. Any transcriptional errors that result from this process are unintentional.  Total visit time including counseling = 30 minutes

## 2021-11-22 NOTE — Telephone Encounter (Signed)
Patient called states she forgot to ask you if metformin was a anti-aging medication?

## 2021-11-25 ENCOUNTER — Encounter: Payer: Self-pay | Admitting: Endocrinology

## 2021-11-26 NOTE — Patient Instructions (Signed)
Switch to low fat salad dressings, ice cream and sides at lunch and supper.  Remember, cheese, nuts/peanut butter, ice cream, chips and chocolate are your highest fat, highest calorie snacks you can eat. Look over other snack options given and try them.   Continue to exercise, but increase the time by 5 more minutes, or one more day/wk.

## 2021-12-16 ENCOUNTER — Other Ambulatory Visit: Payer: Self-pay | Admitting: Endocrinology

## 2021-12-26 ENCOUNTER — Encounter: Payer: Self-pay | Admitting: Internal Medicine

## 2021-12-26 ENCOUNTER — Ambulatory Visit (INDEPENDENT_AMBULATORY_CARE_PROVIDER_SITE_OTHER): Payer: HMO | Admitting: Internal Medicine

## 2021-12-26 ENCOUNTER — Ambulatory Visit: Payer: Self-pay | Admitting: *Deleted

## 2021-12-26 VITALS — BP 124/78 | HR 91 | Temp 97.6°F | Ht 62.0 in | Wt 154.0 lb

## 2021-12-26 DIAGNOSIS — A084 Viral intestinal infection, unspecified: Secondary | ICD-10-CM | POA: Diagnosis not present

## 2021-12-26 MED ORDER — ONDANSETRON HCL 4 MG PO TABS: 4.0000 mg | ORAL_TABLET | Freq: Three times a day (TID) | ORAL | 0 refills | Status: DC | PRN

## 2021-12-26 NOTE — Progress Notes (Signed)
Date:  12/26/2021   Name:  Melinda Ford   DOB:  09-Nov-1945   MRN:  [id number]   Chief Complaint: Vomiting (W/ diarrhea.)  Emesis  This is a new problem. The current episode started yesterday. The problem has been waxing and waning. The emesis has an appearance of stomach contents. There has been no fever. Associated symptoms include abdominal pain and diarrhea. Pertinent negatives include no chest pain, chills, fever, headaches or sweats.  Diarrhea  This is a new problem. The current episode started today. The problem has been unchanged. The stool consistency is described as Watery. The patient states that diarrhea does not awaken her from sleep. Associated symptoms include abdominal pain and vomiting. Pertinent negatives include no chills, fever, headaches or sweats. Nothing aggravates the symptoms. Risk factors include ill contacts (sister with similar sx).    Lab Results  Component Value Date   NA 136 08/19/2021   K 4.2 08/19/2021   CO2 27 08/19/2021   GLUCOSE 206 (H) 08/19/2021   BUN 16 08/19/2021   CREATININE 0.67 08/19/2021   CALCIUM 10.1 08/19/2021   EGFR 82 07/01/2021   GFRNONAA 98 09/10/2015   Lab Results  Component Value Date   CHOL 203 (H) 08/19/2021   HDL 90.20 08/19/2021   LDLCALC 99 08/19/2021   TRIG 69.0 08/19/2021   CHOLHDL 2 08/19/2021   Lab Results  Component Value Date   TSH 1.650 12/28/2020   Lab Results  Component Value Date   HGBA1C 7.9 (A) 11/22/2021   Lab Results  Component Value Date   WBC 5.2 07/01/2021   HGB 14.5 07/01/2021   HCT 44.4 07/01/2021   MCV 93 07/01/2021   PLT 284 07/01/2021   Lab Results  Component Value Date   ALT 21 07/01/2021   AST 23 07/01/2021   ALKPHOS 102 07/01/2021   BILITOT 0.4 07/01/2021   Lab Results  Component Value Date   VD25OH 34.0 12/28/2020     Review of Systems  Constitutional:  Positive for fatigue. Negative for chills, diaphoresis and fever.  Respiratory:  Negative for chest tightness and  shortness of breath.   Cardiovascular:  Negative for chest pain.  Gastrointestinal:  Positive for abdominal pain, diarrhea and vomiting. Negative for blood in stool.  Neurological:  Negative for headaches.    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   Chronic pain syndrome 03/17/2021   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/2021   Cervical paraspinal muscle spasm 02/10/2021   Gastroesophageal reflux disease 12/28/2020   Personal history of colonic polyps    Polyp of sigmoid colon    Abdominal pain, epigastric    Patient noncompliant with statin medication 09/12/2019   Type II diabetes mellitus with complication ([place] [facility]) 30/12/2328   Hyperlipidemia associated with type 2 diabetes mellitus ([place]) 05/17/2018   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 02/22/2018   Chronic midline thoracic back pain 02/22/2018   Tinea versicolor 09/20/2016   OSA (obstructive sleep apnea) 08/23/2016   Allergic rhinitis 07/13/2016   Overweight (BMI 25.0-29.9) 10/01/2015   Vitamin D deficiency 09/18/2014   Onychomycosis 09/16/2014   Degenerative disc disease, lumbar 09/16/2014   Benign essential HTN 10/10/2010   Osteoporosis 10/10/2010    Allergies  Allergen Reactions   Ace Inhibitors Cough   Wheat Bran Hives   Gramineae Pollens     Past Surgical History:  Procedure Laterality Date  ABDOMINAL HYSTERECTOMY     CATARACT EXTRACTION Right 2020   COLONOSCOPY WITH PROPOFOL N/A 10/10/2019   Procedure: COLONOSCOPY WITH PROPOFOL;  Surgeon: Lucilla Lame, MD;  Location: [facility];  Service: Endoscopy;  Laterality: N/A;   ESOPHAGOGASTRODUODENOSCOPY (EGD) WITH PROPOFOL N/A 10/10/2019   Procedure: ESOPHAGOGASTRODUODENOSCOPY (EGD) WITH PROPOFOL;  Surgeon: Lucilla Lame, MD;  Location: [facility];  Service: Endoscopy;  Laterality: N/A;  Diabetic - insulin pump and oral  meds sleep apnea   POLYPECTOMY  10/10/2019   Procedure: POLYPECTOMY;  Surgeon: Lucilla Lame, MD;  Location: [facility];  Service: Endoscopy;;    Social History   Tobacco Use   Smoking status: Never   Smokeless tobacco: Never  Vaping Use   Vaping Use: Never used  Substance Use Topics   Alcohol use: Not Currently    Alcohol/week: 1.0 standard drink of alcohol    Types: 1 Standard drinks or equivalent per week   Drug use: Never     Medication list has been reviewed and updated.  Current Meds  Medication Sig   acetaminophen (TYLENOL) 500 MG tablet Take 1,000 mg by mouth 2 (two) times daily. As needed   amLODipine (NORVASC) 10 MG tablet Take 1 tablet by mouth once daily   Calcium Carbonate-Vit D-Min (CALCIUM 1200 PO) Take 2 each by mouth daily.   Cholecalciferol (VITAMIN D) 2000 UNITS CAPS Take 1 capsule (2,000 Units total) by mouth daily.   clotrimazole-betamethasone (LOTRISONE) cream Apply 1 Application topically daily. To feet and rash under breasts   Continuous Blood Gluc Sensor (FREESTYLE LIBRE 2 SENSOR) MISC APPLY 1 SENSOR EVERY 14 DAYS TO MONITOR BLOOD SUGAR   gabapentin (NEURONTIN) 100 MG capsule Take 1-3 capsules (100-300 mg total) by mouth at bedtime. Follow written titration schedule.   GINKGO BILOBA COMPLEX PO Take 1 capsule by mouth daily.   glucose blood (ONETOUCH VERIO) test strip USE 1 STRIP TO CHECK GLUCOSE 4 TIMES DAILY   insulin aspart (NOVOLOG) 100 UNIT/ML injection USE A MAX OF 90 UNITS DAILY VIA INSULIN PUMP   Insulin Disposable Pump (V-GO 20) KIT 1  ONCE DAILY   Insulin Pen Needle (PEN NEEDLES) 32G X 5 MM MISC Please   losartan (COZAAR) 100 MG tablet Take 1 tablet by mouth once daily   NON FORMULARY CPAP @@ 9 cm H2O nightly   omeprazole (PRILOSEC) 20 MG capsule Take 1 capsule (20 mg total) by mouth 2 (two) times daily before a meal. (Patient taking differently: Take 20 mg by mouth daily.)   ondansetron (ZOFRAN) 4 MG tablet Take 1 tablet (4 mg total) by  mouth every 8 (eight) hours as needed for nausea or vomiting.   pravastatin (PRAVACHOL) 20 MG tablet Take 1 tablet by mouth once daily   Semaglutide, 1 MG/DOSE, (OZEMPIC, 1 MG/DOSE,) 4 MG/3ML SOPN Inject 1 mg weekly   triamcinolone cream (KENALOG) 0.5 % Apply 1 Application topically 3 (three) times daily. To rash on neck       12/26/2021    9:00 AM 09/20/2021    2:42 PM 07/01/2021   10:56 AM 03/17/2021   11:08 AM  GAD 7 : Generalized Anxiety Score  Nervous, Anxious, on Edge 0 0 0 0  Control/stop worrying 0 1 0 0  Worry too much - different things 1 1 0 0  Trouble relaxing 0 0 0 0  Restless 0 1 0 0  Easily annoyed or irritable 0 0 0 0  Afraid - awful might happen 0 0 0 0  Total GAD 7 Score 1 3 0 0  Anxiety Difficulty Not difficult at all Not difficult at all Not difficult at all Not difficult at all       12/26/2021    9:00 AM 09/20/2021    2:42 PM 08/15/2021   12:01 PM  Depression screen PHQ 2/9  Decreased Interest 0 0 0  Down, Depressed, Hopeless 0 0 0  PHQ - 2 Score 0 0 0  Altered sleeping 0 0   Tired, decreased energy 1 1   Change in appetite 0 1   Feeling bad or failure about yourself  0 0   Trouble concentrating 1 0   Moving slowly or fidgety/restless 0 1   Suicidal thoughts 0 0   PHQ-9 Score 2 3   Difficult doing work/chores Not difficult at all Not difficult at all     BP Readings from Last 3 Encounters:  12/26/21 124/78  11/22/21 132/84  09/20/21 136/86    Physical Exam Constitutional:      Appearance: She is ill-appearing.  Cardiovascular:     Rate and Rhythm: Normal rate and regular rhythm.  Pulmonary:     Effort: Pulmonary effort is normal.     Breath sounds: No wheezing or rhonchi.  Abdominal:     General: Abdomen is flat. Bowel sounds are normal.     Palpations: Abdomen is soft.     Tenderness: There is no abdominal tenderness. There is no right CVA tenderness, left CVA tenderness, guarding or rebound.  Musculoskeletal:     Cervical back: Normal  range of motion.  Lymphadenopathy:     Cervical: No cervical adenopathy.  Neurological:     Mental Status: She is alert.     Wt Readings from Last 3 Encounters:  12/26/21 154 lb (69.9 kg)  11/22/21 154 lb 12 oz (70.2 kg)  11/22/21 154 lb 12.8 oz (70.2 kg)    BP 124/78 (BP Location: Right Arm, Cuff Size: Normal)   Pulse 91   Temp 97.6 F (36.4 C) (Oral)   Ht 5' 2"  (1.575 m)   Wt 154 lb (69.9 kg)   SpO2 96%   BMI 28.17 kg/m   Assessment and Plan: 1. Viral gastroenteritis Rest, bland diet, push fluids Avoid imodium unless diarrhea worsens. Follow up if needed. - ondansetron (ZOFRAN) 4 MG tablet; Take 1 tablet (4 mg total) by mouth every 8 (eight) hours as needed for nausea or vomiting.  Dispense: 20 tablet; Refill: 0   Partially dictated using Editor, commissioning. Any errors are unintentional.  Halina Maidens, MD [facility]  12/26/2021

## 2021-12-26 NOTE — Telephone Encounter (Signed)
  Chief Complaint: Vomiting Symptoms: Vomiting, diarrhea since 10-11pm last night, nausea, subjective fever, sweating, chills. HAs not eaten or had anything to drink Frequency: LAst night 10-11P Pertinent Negatives: Patient denies dizziness, abdominal pain, (Cramping when about to vomit or BM.) Disposition: '[]'$ ED /'[]'$ Urgent Care (no appt availability in office) / '[x]'$ Appointment(In office/virtual)/ '[]'$  [facility]/ '[]'$ Home Care/ '[]'$ Refused Recommended Disposition /'[]'$ [facility]/ '[]'$  Follow-up with PCP Additional Notes: Care advise provided, verbalizes understanding.  Reason for Disposition  [1] Fever > 101 F (38.3 C) AND [2] age > 60 years    Subjective fever  Answer Assessment - Initial Assessment Questions 1. VOMITING SEVERITY: "How many times have you vomited in the past 24 hours?"     - MILD:  1 - 2 times/day    - MODERATE: 3 - 5 times/day, decreased oral intake without significant weight loss or symptoms of dehydration    - SEVERE: 6 or more times/day, vomits everything or nearly everything, with significant weight loss, symptoms of dehydration      Every hour 2. ONSET: "When did the vomiting begin?"      Last night 10-11pm 3. FLUIDS: "What fluids or food have you vomited up today?" "Have you been able to keep any fluids down?"     Nothing, haven't tried 4. ABDOMEN PAIN: "Are your having any abdomen pain?" If Yes : "How bad is it and what does it feel like?" (e.g., crampy, dull, intermittent, constant)     Cramping when "Vomiting about to come." 5. DIARRHEA: "Is there any diarrhea?" If Yes, ask: "How many times today?"      Yes, 6. CONTACTS: "Is there anyone else in the family with the same symptoms?"      No 7. CAUSE: "What do you think is causing your vomiting?"     unsure 8. HYDRATION STATUS: "Any signs of dehydration?" (e.g., dry mouth [not only dry lips], too weak to stand) "When did you last urinate?"      9. OTHER SYMPTOMS: "Do you have any other symptoms?"  (e.g., fever, headache, vertigo, vomiting blood or coffee grounds, recent head injury)     Fever, hot and chills  Protocols used: Vomiting-A-AH

## 2021-12-28 ENCOUNTER — Ambulatory Visit: Payer: HMO | Admitting: Occupational Therapy

## 2021-12-29 ENCOUNTER — Encounter: Payer: Self-pay | Admitting: Occupational Therapy

## 2021-12-29 ENCOUNTER — Ambulatory Visit: Payer: HMO | Attending: Student in an Organized Health Care Education/Training Program | Admitting: Occupational Therapy

## 2021-12-29 DIAGNOSIS — M6281 Muscle weakness (generalized): Secondary | ICD-10-CM | POA: Insufficient documentation

## 2021-12-29 NOTE — Therapy (Signed)
[facility] [address], [place], [address] Phone: [phone]   Fax:  [phone]  Occupational Therapy Evaluation  Patient Details  Name: Melinda Ford MRN: [id number] Date of Birth: [birth year] Referring Provider (OT): DR Holley Raring   Encounter Date: 12/29/2021   OT End of Session - 12/29/21 1804     Visit Number 1    Number of Visits 8    Date for OT Re-Evaluation 02/23/22    OT Start Time 1400    OT Stop Time 1449    OT Time Calculation (min) 49 min    Activity Tolerance Patient tolerated treatment well    Behavior During Therapy [facility] for tasks assessed/performed             Past Medical History:  Diagnosis Date   Allergy    Arthritis    Cataract    Diabetes mellitus without complication ([place])    Hyperlipidemia    Hypertension    Motion sickness    planes   OSA (obstructive sleep apnea) 08/23/2016   CPAP   Osteoporosis    Presence of dental prosthetic device    implants   Radiculopathy of lumbosacral region 06/22/2016   Right lateral epicondylitis 06/04/2017   Right sided sciatica 03/09/2016   Sleep apnea     Past Surgical History:  Procedure Laterality Date   ABDOMINAL HYSTERECTOMY     CATARACT EXTRACTION Right 2020   COLONOSCOPY WITH PROPOFOL N/A 10/10/2019   Procedure: COLONOSCOPY WITH PROPOFOL;  Surgeon: Lucilla Lame, MD;  Location: [facility];  Service: Endoscopy;  Laterality: N/A;   ESOPHAGOGASTRODUODENOSCOPY (EGD) WITH PROPOFOL N/A 10/10/2019   Procedure: ESOPHAGOGASTRODUODENOSCOPY (EGD) WITH PROPOFOL;  Surgeon: Lucilla Lame, MD;  Location: [facility];  Service: Endoscopy;  Laterality: N/A;  Diabetic - insulin pump and oral meds sleep apnea   POLYPECTOMY  10/10/2019   Procedure: POLYPECTOMY;  Surgeon: Lucilla Lame, MD;  Location: [facility];  Service: Endoscopy;;    There were no vitals filed for this visit.   Subjective Assessment - 12/29/21 1746      Subjective  I seen PT but my L hand still not working rigth. Moslty the pinkie- I feel I have good grip but not trusting it. Hard time typing, putting on jewelry , gripping glass- no numbness    Pertinent History She is having difficulty with left hand lifting, typing, jewelry and gripping- pinkie not work well.  Of note, she has cervical spondylosis with impingement at C4-C5, C3-C4.  She also has an accentuated central cord signal at C4-C5 that could be contributing to her radicular symptoms.  She is currently on gabapentin.  At Dr Elwyn Lade visit she was  interested in learning more about a cervical epidural steroid injection.  We discussed this at length including the risks and benefits associated with injection.  Patient states that she will think about this further and let us know if she would like to proceed.    Pt just seen PT and refer to OT for continues L hand involvement    Patient Stated Goals I want my pinkie and hand to work better so I can typing, pick up glass, lifting and hold objects    Currently in Pain? 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 Reinforced with patient to do weightbearing prior to any attempts of reps and sets for home exercises. Reinforced quality is more important than quantity of amount of reps and sets. Patient to do weightbearing on firm chair through left palm prior to attempts for extension of fourth and fifth digits of table 3 sets of 5. Weightbearing prior to each attempt 3 sets of 5 for lumbrical fist placed on hold if needed <>full extension Wrist extension with elbow to side horizontal 2 sets of 12-15 with 2 pound weight relaxed in neutral avoid flexion. Opposition of golf ball from palm to fingertips during opposition to radial index and third digit<> palm<> to fourth and fifth<> to palm 3 sets of 5-8 with weightbearing in between if needed. Patient to do home program at least 2 times a day for a week and follow-up.               OT Education - 12/29/21 1803     Education Details Findings of eval and HEP    Person(s) Educated Patient    Methods Explanation;Demonstration;Tactile cues;Verbal cues;Handout    Comprehension Verbal cues required;Returned demonstration;Verbalized understanding                 OT Long Term Goals - 12/29/21 1814       OT LONG TERM GOAL #1   Title Patient to be independent in home program for weightbearing in between facilitation of correct quantity and position of home exercises to increase strength and ulnar side of hand    Baseline Patient needed max verbal cueing and moderate assistance and review of home exercises.  Decrease lumbrical and intrinsic a fist thumb on left side of hand.  Decreased ECU    Time 3    Period Weeks    Status New    Target Date 01/19/22      OT LONG TERM GOAL #2   Title Left fifth digit extension improved for patient to maintain full extension to be able to don gloves and put hand in pocket.    Baseline MC extension -30 degrees at fifth and -20  degrees at fourth MC.-Unable to maintain PIP extension with MC flexed at 90 degrees.  Unable to tap for the fifth digit of the table.  Time 4    Period Weeks    Status New    Target Date 01/26/22      OT LONG TERM GOAL #3   Title Left wrist extension at ECU improved for patient to be able to stabilize wrist and using three-point pinch to right and put on jewelry.    Baseline Patient with increased stability for wrist extension during applying earrings and writing; increase strength and ECR but decrease strength and ECU-wrist extension -60 with fourth and fifth digits in flexion    Time 6    Period Weeks    Status New    Target Date 02/09/22      OT LONG TERM GOAL #4   Title Intrinsic a fist and coordination improved for patient to be able to manipulate objects from palm to fingertips, type on computer and cut food with more ease    Baseline Decreased intrinsic a fist at fourth and fifth on left hand difficulty with manipulating objects from palm to fingertips with mostly fourth and fifth impaired; difficulty with maintaining power grip with ulnar side of hand while applying or use radial 3 digits.    Time 8    Period Weeks    Status New    Target Date 02/23/22                   Plan - 12/29/21 1806     Clinical Impression Statement Patient is a 76 year old  refer by Dr Holley Raring - that was seen by OT and recommeneded OT for continues L hand weakness with greatest loss in fifth digit. Upon assessment pt show grip and prehension strength WNL compare to the R and pt denies any sensation issues. She is taking Gabapentin and that is helping for nerve pain. Pt do show decrease strength in 4th with 5th the worse for extention out of the [facility], as well as MC flexion with PIP extention. Decrease intrinsic fist and decrease ECU. Pt with decrease stability at wrist with 3 point or 2 point pinch. Also demonstrate decrease coordination of 4th and 5th opposition and manupulation of object from  palm<> digits. Decrease stabilty at wrist with 3 point pinch -all limitng her functional use of L hand in ADL's and IADL's including typing on computer and writing. Pt appear to cont to have some weakness from radial N involvement, but it is reassuring that sensation is intact as well as intact strength at ext ind, EPL and B and APL -Pt can benefit from skilled OT services to increase AROM and strength to increase functional use of L hand to prior level of function    OT Occupational Profile and History Problem Focused Assessment - Including review of records relating to presenting problem    Occupational performance deficits (Please refer to evaluation for details): ADL's;IADL's;Work;Play;Leisure;Social Participation    Body Structure / Function / Physical Skills ADL;Strength;Dexterity;UE functional use;IADL;ROM;Flexibility;Coordination    Rehab Potential Fair    Clinical Decision Making Limited treatment options, no task modification necessary    Comorbidities Affecting Occupational Performance: None    Modification or Assistance to Complete Evaluation  No modification of tasks or assist necessary to complete eval    OT Frequency 1x / week    OT Duration 8 weeks    OT Treatment/Interventions Self-care/ADL training;Therapeutic activities;Therapeutic exercise;Passive range of motion;Manual Therapy;Patient/family education    Consulted and Agree with Plan of Care Patient             Patient will benefit  from skilled therapeutic intervention in order to improve the following deficits and impairments:   Body Structure / Function / Physical Skills: ADL, Strength, Dexterity, UE functional use, IADL, ROM, Flexibility, Coordination       Visit Diagnosis: Muscle weakness (generalized)    Problem List 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   Chronic pain syndrome 03/17/2021    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/2021   Cervical paraspinal muscle spasm 02/10/2021   Gastroesophageal reflux disease 12/28/2020   Personal history of colonic polyps    Polyp of sigmoid colon    Abdominal pain, epigastric    Patient noncompliant with statin medication 09/12/2019   Type II diabetes mellitus with complication ([place]) 73/22/0254   Hyperlipidemia associated with type 2 diabetes mellitus ([place]) 05/17/2018   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 [place], [address] Phone: [phone]   Fax:  [phone]  Name: PRANAVI AURE MRN: [id number] Date of Birth: 1945-06-28

## 2022-01-01 ENCOUNTER — Other Ambulatory Visit: Payer: Self-pay | Admitting: Internal Medicine

## 2022-01-02 ENCOUNTER — Other Ambulatory Visit: Payer: Self-pay | Admitting: *Deleted

## 2022-01-02 ENCOUNTER — Telehealth: Payer: Self-pay

## 2022-01-02 ENCOUNTER — Other Ambulatory Visit: Payer: Self-pay

## 2022-01-02 MED ORDER — FREESTYLE LIBRE 2 SENSOR MISC: 1 refills | Status: DC

## 2022-01-02 NOTE — Addendum Note (Signed)
Addended by: Hoyle Barr on: 01/02/2022 12:42 PM   Modules accepted: Orders

## 2022-01-02 NOTE — Telephone Encounter (Signed)
Requested Prescriptions  Pending Prescriptions Disp Refills  . Continuous Blood Gluc Sensor (FREESTYLE LIBRE 2 SENSOR) MISC 2 each 1    Sig: Use as instructed     Endocrinology: Diabetes - Testing Supplies Passed - 01/02/2022 12:56 PM      Passed - Valid encounter within last 12 months    Recent Outpatient Visits          1 week ago Viral gastroenteritis   [place] [facility] at [facility], Jesse Sans, MD   3 months ago Tinea pedis of both feet   [place] [facility] at [facility], Jesse Sans, MD   5 months ago Pharyngitis, unspecified etiology   [place] [facility] at [facility], Jesse Sans, MD   6 months ago Upper respiratory tract infection, unspecified type   [facility] at [facility], Jesse Sans, MD   9 months ago Cervical spondylosis with radiculopathy   [facility] at [facility], Earley Abide, MD

## 2022-01-02 NOTE — Addendum Note (Signed)
Addended by: Hoyle Barr on: 01/02/2022 12:58 PM   Modules accepted: Orders

## 2022-01-02 NOTE — Addendum Note (Signed)
Addended by: Hoyle Barr on: 01/02/2022 12:26 PM   Modules accepted: Orders

## 2022-01-02 NOTE — Telephone Encounter (Signed)
Duplicate, changed to normal from print and re-sent. Requested Prescriptions  Pending Prescriptions Disp Refills  . Continuous Blood Gluc Sensor (FREESTYLE LIBRE 2 SENSOR) MISC [Pharmacy Med Name: FREESTYLE LIBRE 2 SENSOR KIT] 2 each 0    Sig: APPLY 1 SENSOR EVERY 14 DAYS TO MONITOR BLOOD SURGAR     Endocrinology: Diabetes - Testing Supplies Passed - 01/01/2022 10:53 AM      Passed - Valid encounter within last 12 months    Recent Outpatient Visits          1 week ago Viral gastroenteritis   [place] [facility] at [facility], Jesse Sans, MD   3 months ago Tinea pedis of both feet   [place] [facility] at [facility], Jesse Sans, MD   5 months ago Pharyngitis, unspecified etiology   [place] [facility] at [facility], Jesse Sans, MD   6 months ago Upper respiratory tract infection, unspecified type   [facility] at [facility], Jesse Sans, MD   9 months ago Cervical spondylosis with radiculopathy   [facility] at [facility], Earley Abide, MD

## 2022-01-02 NOTE — Telephone Encounter (Signed)
Pharmacy called due to still not receiving the [facility] order. It is under print, I will change it and send now. Pharm aware.

## 2022-01-02 NOTE — Telephone Encounter (Signed)
Requested Prescriptions  Pending Prescriptions Disp Refills  . Continuous Blood Gluc Sensor (FREESTYLE LIBRE 2 SENSOR) MISC 2 each 1    Sig: Use as instructed     Endocrinology: Diabetes - Testing Supplies Passed - 01/02/2022 12:26 PM      Passed - Valid encounter within last 12 months    Recent Outpatient Visits          1 week ago Viral gastroenteritis   [place] [facility] at [facility], Jesse Sans, MD   3 months ago Tinea pedis of both feet   [place] [facility] at [facility], Jesse Sans, MD   5 months ago Pharyngitis, unspecified etiology   [place] [facility] at [facility], Jesse Sans, MD   6 months ago Upper respiratory tract infection, unspecified type   [facility] at [facility], Jesse Sans, MD   9 months ago Cervical spondylosis with radiculopathy   [facility] at [facility], Earley Abide, MD

## 2022-01-02 NOTE — Telephone Encounter (Signed)
Kristen at [facility] requests refill on [employer]. Sent electronically.

## 2022-01-05 ENCOUNTER — Encounter: Payer: HMO | Admitting: Occupational Therapy

## 2022-01-08 ENCOUNTER — Other Ambulatory Visit: Payer: Self-pay | Admitting: Endocrinology

## 2022-01-08 DIAGNOSIS — E1165 Type 2 diabetes mellitus with hyperglycemia: Secondary | ICD-10-CM

## 2022-01-09 ENCOUNTER — Ambulatory Visit: Payer: HMO | Attending: Student in an Organized Health Care Education/Training Program | Admitting: Occupational Therapy

## 2022-01-09 ENCOUNTER — Encounter: Payer: HMO | Admitting: Occupational Therapy

## 2022-01-09 DIAGNOSIS — M5413 Radiculopathy, cervicothoracic region: Secondary | ICD-10-CM | POA: Insufficient documentation

## 2022-01-09 DIAGNOSIS — M6281 Muscle weakness (generalized): Secondary | ICD-10-CM | POA: Insufficient documentation

## 2022-01-09 DIAGNOSIS — M79602 Pain in left arm: Secondary | ICD-10-CM | POA: Insufficient documentation

## 2022-01-09 NOTE — Therapy (Signed)
[facility] [address] [place], [address] Phone: [phone]   Fax:  [phone]  Occupational Therapy Treatment  Patient Details  Name: Melinda Ford MRN: [id number] Date of Birth: [birth year] Referring Provider (OT): DR Holley Raring   Encounter Date: 01/09/2022   OT End of Session - 01/09/22 1916     Visit Number 2    Number of Visits 8    Date for OT Re-Evaluation 02/23/22    OT Start Time 0911    OT Stop Time 0950    OT Time Calculation (min) 39 min    Activity Tolerance Patient tolerated treatment well    Behavior During Therapy [facility] for tasks assessed/performed             Past Medical History:  Diagnosis Date   Allergy    Arthritis    Cataract    Diabetes mellitus without complication ([place])    Hyperlipidemia    Hypertension    Motion sickness    planes   OSA (obstructive sleep apnea) 08/23/2016   CPAP   Osteoporosis    Presence of dental prosthetic device    implants   Radiculopathy of lumbosacral region 06/22/2016   Right lateral epicondylitis 06/04/2017   Right sided sciatica 03/09/2016   Sleep apnea     Past Surgical History:  Procedure Laterality Date   ABDOMINAL HYSTERECTOMY     CATARACT EXTRACTION Right 2020   COLONOSCOPY WITH PROPOFOL N/A 10/10/2019   Procedure: COLONOSCOPY WITH PROPOFOL;  Surgeon: Lucilla Lame, MD;  Location: [place];  Service: Endoscopy;  Laterality: N/A;   ESOPHAGOGASTRODUODENOSCOPY (EGD) WITH PROPOFOL N/A 10/10/2019   Procedure: ESOPHAGOGASTRODUODENOSCOPY (EGD) WITH PROPOFOL;  Surgeon: Lucilla Lame, MD;  Location: [place];  Service: Endoscopy;  Laterality: N/A;  Diabetic - insulin pump and oral meds sleep apnea   POLYPECTOMY  10/10/2019   Procedure: POLYPECTOMY;  Surgeon: Lucilla Lame, MD;  Location: [place];  Service: Endoscopy;;    There were no vitals filed for this visit.   Subjective Assessment - 01/09/22 1914      Subjective  The ring finger is better but pinkie still mind of his own - you need to go over the exercises with me again    Pertinent History She is having difficulty with left hand lifting, typing, jewelry and gripping- pinkie not work well.  Of note, she has cervical spondylosis with impingement at C4-C5, C3-C4.  She also has an accentuated central cord signal at C4-C5 that could be contributing to her radicular symptoms.  She is currently on gabapentin.  At Dr Elwyn Lade visit she was  interested in learning more about a cervical epidural steroid injection.  We discussed this at length including the risks and benefits associated with injection.  Patient states that she will think about this further and let us know if she would like to proceed.    Pt just seen PT and refer to OT for continues L hand involvement    Patient Stated Goals I want my pinkie and hand to work better so I can typing, pick up glass, lifting and hold objects    Currently in Pain? No/denies                [facility] OT Assessment - 01/09/22 0001       Strength   Right Hand Grip (lbs) 60    Right Hand Lateral Pinch 14 lbs    Right Hand 3 Point  Pinch 10 lbs    Left Hand Grip (lbs) 41    Left Hand Lateral Pinch 10 lbs    Left Hand 3 Point Pinch 10 lbs      Left Hand AROM   L Ring  MCP 0-90 --   -10   L Little  MCP 0-90 --   -20           Pt with increase extention of 4th and 5th MC - as well as increase ease with opposition of object from palm to digits - 4th and 5th    Reviewed with patient home exercises again- and weight bearing reinforce Reinforced with patient to do weightbearing prior to any attempts of reps and sets for home exercises to decrease flexor tightness Reinforced quality is more important than quantity of amount of reps and sets. Patient to do weightbearing on firm chair through left palm prior to attempts for extension of fourth and fifth digits of table 3 sets of 5. Slide off table and attempt  to maintain extention of 4th and 5th MC extention   Weightbearing prior to each attempt 3 sets of 5 for lumbrical fist placed on hold if needed <>full extension -increase ease of weight bearing Wrist extension with elbow to side horizontal 2 sets of 12-15 with 2 pound weight relaxed in neutral avoid flexion. Add this date UD /RD 2 lbs weight- maintain neutral - 2x 15 reps over armrest  Opposition of golf ball from palm to fingertips during opposition to radial index and third digit<> palm<> to fourth and fifth<> to palm 3 sets of 5-8 with weightbearing in between if needed. Add opposition to all digits <> palm - golfball - PIP extention with MC flexion at 4th and 5th  Patient to do home program at least 2 times a day for a week and follow-up.                      OT Education - 01/09/22 1915     Education Details progress and revier HEP    Person(s) Educated Patient    Methods Explanation;Demonstration;Tactile cues;Verbal cues;Handout    Comprehension Verbal cues required;Returned demonstration;Verbalized understanding                 OT Long Term Goals - 12/29/21 1814       OT LONG TERM GOAL #1   Title Patient to be independent in home program for weightbearing in between facilitation of correct quantity and position of home exercises to increase strength and ulnar side of hand    Baseline Patient needed max verbal cueing and moderate assistance and review of home exercises.  Decrease lumbrical and intrinsic a fist thumb on left side of hand.  Decreased ECU    Time 3    Period Weeks    Status New    Target Date 01/19/22      OT LONG TERM GOAL #2   Title Left fifth digit extension improved for patient to maintain full extension to be able to don gloves and put hand in pocket.    Baseline MC extension -30 degrees at fifth and -20 degrees at fourth MC.-Unable to maintain PIP extension with MC flexed at 90 degrees.  Unable to tap for the fifth digit of the table.     Time 4    Period Weeks    Status New    Target Date 01/26/22      OT LONG TERM GOAL #3  Title Left wrist extension at ECU improved for patient to be able to stabilize wrist and using three-point pinch to right and put on jewelry.    Baseline Patient with increased stability for wrist extension during applying earrings and writing; increase strength and ECR but decrease strength and ECU-wrist extension -60 with fourth and fifth digits in flexion    Time 6    Period Weeks    Status New    Target Date 02/09/22      OT LONG TERM GOAL #4   Title Intrinsic a fist and coordination improved for patient to be able to manipulate objects from palm to fingertips, type on computer and cut food with more ease    Baseline Decreased intrinsic a fist at fourth and fifth on left hand difficulty with manipulating objects from palm to fingertips with mostly fourth and fifth impaired; difficulty with maintaining power grip with ulnar side of hand while applying or use radial 3 digits.    Time 8    Period Weeks    Status New    Target Date 02/23/22                   Plan - 01/09/22 1916     OT Occupational Profile and History Problem Focused Assessment - Including review of records relating to presenting problem    Occupational performance deficits (Please refer to evaluation for details): ADL's;IADL's;Work;Play;Leisure;Social Participation    Body Structure / Function / Physical Skills ADL;Strength;Dexterity;UE functional use;IADL;ROM;Flexibility;Coordination    Rehab Potential Fair    Clinical Decision Making Limited treatment options, no task modification necessary    Comorbidities Affecting Occupational Performance: None    Modification or Assistance to Complete Evaluation  No modification of tasks or assist necessary to complete eval    OT Frequency 1x / week    OT Duration 8 weeks    OT Treatment/Interventions Self-care/ADL training;Therapeutic activities;Therapeutic exercise;Passive  range of motion;Manual Therapy;Patient/family education    Consulted and Agree with Plan of Care Patient             Patient will benefit from skilled therapeutic intervention in order to improve the following deficits and impairments:   Body Structure / Function / Physical Skills: ADL, Strength, Dexterity, UE functional use, IADL, ROM, Flexibility, Coordination       Visit Diagnosis: Muscle weakness (generalized)    Problem List 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   Chronic pain syndrome 03/17/2021   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/2021   Cervical paraspinal muscle spasm 02/10/2021   Gastroesophageal reflux disease 12/28/2020   Personal history of colonic polyps    Polyp of sigmoid colon    Abdominal pain, epigastric    Patient noncompliant with statin medication 09/12/2019   Type II diabetes mellitus with complication ([place]) 29/56/2130   Hyperlipidemia associated with type 2 diabetes mellitus ([place]) 05/17/2018   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 02/22/2018   Chronic midline thoracic back pain 02/22/2018   Tinea versicolor 09/20/2016   OSA (obstructive sleep apnea) 08/23/2016   Allergic rhinitis 07/13/2016   Overweight (BMI 25.0-29.9) 10/01/2015   Vitamin D deficiency 09/18/2014   Onychomycosis 09/16/2014   Degenerative disc disease, lumbar 09/16/2014   Benign essential HTN 10/10/2010   Osteoporosis 10/10/2010    Rosalyn Gess, OTR/L,CLT 01/09/2022, 7:20 PM  [facility] [address] [place], [address] Phone: [phone]   Fax:  [phone]  Name: BRIAH NARY MRN: [id number] Date of Birth: 12-Aug-1945

## 2022-01-18 ENCOUNTER — Ambulatory Visit: Payer: HMO | Admitting: Occupational Therapy

## 2022-01-18 DIAGNOSIS — M6281 Muscle weakness (generalized): Secondary | ICD-10-CM | POA: Diagnosis not present

## 2022-01-18 DIAGNOSIS — M79602 Pain in left arm: Secondary | ICD-10-CM

## 2022-01-18 DIAGNOSIS — M5413 Radiculopathy, cervicothoracic region: Secondary | ICD-10-CM

## 2022-01-18 NOTE — Therapy (Signed)
[facility] [address] [place], [address] Phone: [phone]   Fax:  [phone]  Occupational Therapy Treatment  Patient Details  Name: Melinda Ford MRN: [id number] Date of Birth: [birth year] Referring Provider (OT): DR Holley Raring   Encounter Date: 01/18/2022   OT End of Session - 01/18/22 2107     Visit Number 3    Number of Visits 8    Date for OT Re-Evaluation 02/23/22    OT Start Time 6237    OT Stop Time 1700    OT Time Calculation (min) 43 min    Activity Tolerance Patient tolerated treatment well    Behavior During Therapy [facility] for tasks assessed/performed             Past Medical History:  Diagnosis Date   Allergy    Arthritis    Cataract    Diabetes mellitus without complication ([place])    Hyperlipidemia    Hypertension    Motion sickness    planes   OSA (obstructive sleep apnea) 08/23/2016   CPAP   Osteoporosis    Presence of dental prosthetic device    implants   Radiculopathy of lumbosacral region 06/22/2016   Right lateral epicondylitis 06/04/2017   Right sided sciatica 03/09/2016   Sleep apnea     Past Surgical History:  Procedure Laterality Date   ABDOMINAL HYSTERECTOMY     CATARACT EXTRACTION Right 2020   COLONOSCOPY WITH PROPOFOL N/A 10/10/2019   Procedure: COLONOSCOPY WITH PROPOFOL;  Surgeon: Lucilla Lame, MD;  Location: [place];  Service: Endoscopy;  Laterality: N/A;   ESOPHAGOGASTRODUODENOSCOPY (EGD) WITH PROPOFOL N/A 10/10/2019   Procedure: ESOPHAGOGASTRODUODENOSCOPY (EGD) WITH PROPOFOL;  Surgeon: Lucilla Lame, MD;  Location: [place];  Service: Endoscopy;  Laterality: N/A;  Diabetic - insulin pump and oral meds sleep apnea   POLYPECTOMY  10/10/2019   Procedure: POLYPECTOMY;  Surgeon: Lucilla Lame, MD;  Location: [place];  Service: Endoscopy;;    There were no vitals filed for this visit.   Subjective Assessment - 01/18/22 2107      Subjective  RIng finger still improving ut pinkie still cannot lift off table to straigthen    Pertinent History She is having difficulty with left hand lifting, typing, jewelry and gripping- pinkie not work well.  Of note, she has cervical spondylosis with impingement at C4-C5, C3-C4.  She also has an accentuated central cord signal at C4-C5 that could be contributing to her radicular symptoms.  She is currently on gabapentin.  At Dr Elwyn Lade visit she was  interested in learning more about a cervical epidural steroid injection.  We discussed this at length including the risks and benefits associated with injection.  Patient states that she will think about this further and let us know if she would like to proceed.    Pt just seen PT and refer to OT for continues L hand involvement    Patient Stated Goals I want my pinkie and hand to work better so I can typing, pick up glass, lifting and hold objects    Currently in Pain? No/denies                [facility] OT Assessment - 01/18/22 0001       Strength   Right Hand Lateral Pinch 14 lbs (P)     Left Hand Lateral Pinch 11 lbs (P)     Left Hand 3 Point Pinch 10 lbs (P)  Left Hand AROM   L Ring  MCP 0-90 -- (P)    -5   L Little  MCP 0-90 -- (P)    -20               Pt with increase extention of 4th  more than 5th MC this week - as well as increase ease with opposition of object from palm to digits - 4th and 5th but cont to be decrease compare to 2nd and 3rd    Reviewed with patient home exercises again- and weight bearing reinforce - AGAIN Reinforced with patient to do weightbearing prior to any attempts of reps and sets for home exercises to decrease flexor tightness !! Reinforced quality is more important than quantity of amount of reps and sets. Patient to do weightbearing on firm chair through left palm prior to attempts for extension of fourth and fifth digits of table 3 sets of 5. Slide off table and attempt to maintain  extention of 4th and 5th MC extention  Add this date rubberband for extention of 4th and 5th - with thumb anchor- but need to do place and hold 3 x 6 reps - with weight bearing - can do on thigh   Weightbearing prior to each attempt 3 sets of 5 for lumbrical fist placed on hold if needed <>full extension -increase ease of weight bearing Wrist extension with elbow to side horizontal 2 sets of 12-15 with 2 pound weight relaxed in neutral avoid flexion. Cont  UD /RD 2 lbs weight- maintain neutral - 2x 15 reps over armrest   Opposition of golf ball from palm to fingertips during opposition to radial index and third digit<> palm<> to fourth and fifth<> to palm 3 sets of 5-8 with weightbearing in between if needed. Patient to do home program at least 2 times a day for 2 wks this time                  OT Education - 01/18/22 2107     Education Details progress and revier HEP    Person(s) Educated Patient    Methods Explanation;Demonstration;Tactile cues;Verbal cues;Handout    Comprehension Verbal cues required;Returned demonstration;Verbalized understanding                 OT Long Term Goals - 12/29/21 1814       OT LONG TERM GOAL #1   Title Patient to be independent in home program for weightbearing in between facilitation of correct quantity and position of home exercises to increase strength and ulnar side of hand    Baseline Patient needed max verbal cueing and moderate assistance and review of home exercises.  Decrease lumbrical and intrinsic a fist thumb on left side of hand.  Decreased ECU    Time 3    Period Weeks    Status New    Target Date 01/19/22      OT LONG TERM GOAL #2   Title Left fifth digit extension improved for patient to maintain full extension to be able to don gloves and put hand in pocket.    Baseline MC extension -30 degrees at fifth and -20 degrees at fourth MC.-Unable to maintain PIP extension with MC flexed at 90 degrees.  Unable to tap for  the fifth digit of the table.    Time 4    Period Weeks    Status New    Target Date 01/26/22      OT LONG TERM GOAL #3  Title Left wrist extension at ECU improved for patient to be able to stabilize wrist and using three-point pinch to right and put on jewelry.    Baseline Patient with increased stability for wrist extension during applying earrings and writing; increase strength and ECR but decrease strength and ECU-wrist extension -60 with fourth and fifth digits in flexion    Time 6    Period Weeks    Status New    Target Date 02/09/22      OT LONG TERM GOAL #4   Title Intrinsic a fist and coordination improved for patient to be able to manipulate objects from palm to fingertips, type on computer and cut food with more ease    Baseline Decreased intrinsic a fist at fourth and fifth on left hand difficulty with manipulating objects from palm to fingertips with mostly fourth and fifth impaired; difficulty with maintaining power grip with ulnar side of hand while applying or use radial 3 digits.    Time 8    Period Weeks    Status New    Target Date 02/23/22                   Plan - 01/18/22 2112     Clinical Impression Statement Patient is a 76 year old  refer by Dr Holley Raring -  weakness with greatest loss in fifth digit. Pt denies any sensation issues. She is taking Gabapentin and that is helping for nerve pain. Pt do show decrease strength in 4th with 5th the worse for extention out of the [facility], as well as MC flexion with PIP extention at eval. In 2 sessions pt show increase 4th digit extention and increase grip and lat pinch. 5th digit extention is increasing and able to add to HEP place and hold rubber band extention HEP. Intrinsic fist still decrease with flexion of MC -and decrease ECU. Pt show increase stability at wrist with 3 point.pinch. Improving but need to cont to adress coordination of 4th and 5th opposition and manupulation of object from palm<> digits.  Pt appear to  cont to have some weakness from radial N involvement, but it is reassuring that sensation is intact as well as intact strength at ext ind, EPL and B and APL -Pt can benefit from skilled OT services to increase AROM and strength to increase functional use of L hand to prior level of function    OT Occupational Profile and History Problem Focused Assessment - Including review of records relating to presenting problem    Occupational performance deficits (Please refer to evaluation for details): ADL's;IADL's;Work;Play;Leisure;Social Participation    Body Structure / Function / Physical Skills ADL;Strength;Dexterity;UE functional use;IADL;ROM;Flexibility;Coordination    Rehab Potential Fair    Clinical Decision Making Limited treatment options, no task modification necessary    Comorbidities Affecting Occupational Performance: None    Modification or Assistance to Complete Evaluation  No modification of tasks or assist necessary to complete eval    OT Frequency 1x / week    OT Duration 8 weeks    OT Treatment/Interventions Self-care/ADL training;Therapeutic activities;Therapeutic exercise;Passive range of motion;Manual Therapy;Patient/family education    Consulted and Agree with Plan of Care Patient             Patient will benefit from skilled therapeutic intervention in order to improve the following deficits and impairments:   Body Structure / Function / Physical Skills: ADL, Strength, Dexterity, UE functional use, IADL, ROM, Flexibility, Coordination       Visit Diagnosis:  Muscle weakness (generalized)  Radiculopathy, cervicothoracic region  Pain in left arm    Problem List 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   Chronic pain syndrome 03/17/2021   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/2021   Cervical paraspinal muscle spasm 02/10/2021   Gastroesophageal  reflux disease 12/28/2020   Personal history of colonic polyps    Polyp of sigmoid colon    Abdominal pain, epigastric    Patient noncompliant with statin medication 09/12/2019   Type II diabetes mellitus with complication ([place]) 26/94/8546   Hyperlipidemia associated with type 2 diabetes mellitus ([place]) 05/17/2018   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 02/22/2018   Chronic midline thoracic back pain 02/22/2018   Tinea versicolor 09/20/2016   OSA (obstructive sleep apnea) 08/23/2016   Allergic rhinitis 07/13/2016   Overweight (BMI 25.0-29.9) 10/01/2015   Vitamin D deficiency 09/18/2014   Onychomycosis 09/16/2014   Degenerative disc disease, lumbar 09/16/2014   Benign essential HTN 10/10/2010   Osteoporosis 10/10/2010    Rosalyn Gess, OTR/L,CLT 01/18/2022, 9:21 PM  [facility] [address], [place], [address] Phone: [phone]   Fax:  [phone]  Name: JAYLISE PEEK MRN: [id number] Date of Birth: Jan 07, 1946

## 2022-02-01 ENCOUNTER — Encounter: Payer: HMO | Admitting: Occupational Therapy

## 2022-02-03 ENCOUNTER — Encounter: Payer: HMO | Admitting: Occupational Therapy

## 2022-02-06 ENCOUNTER — Ambulatory Visit: Payer: HMO | Attending: Student in an Organized Health Care Education/Training Program | Admitting: Occupational Therapy

## 2022-02-06 DIAGNOSIS — M6281 Muscle weakness (generalized): Secondary | ICD-10-CM | POA: Diagnosis not present

## 2022-02-06 DIAGNOSIS — M5413 Radiculopathy, cervicothoracic region: Secondary | ICD-10-CM | POA: Insufficient documentation

## 2022-02-06 NOTE — Therapy (Signed)
[facility] [address] [place], [address] Phone: [phone]   Fax:  [phone]  Occupational Therapy Treatment  Patient Details  Name: Melinda Ford MRN: [id number] Date of Birth: 1946/03/11 Referring Provider (OT): DR Holley Raring   Encounter Date: 02/06/2022   OT End of Session - 02/06/22 1525     Visit Number 4    Number of Visits 8    Date for OT Re-Evaluation 02/23/22    OT Start Time 19    OT Stop Time 64    OT Time Calculation (min) 38 min    Activity Tolerance Patient tolerated treatment well    Behavior During Therapy [facility] for tasks assessed/performed             Past Medical History:  Diagnosis Date   Allergy    Arthritis    Cataract    Diabetes mellitus without complication ([place])    Hyperlipidemia    Hypertension    Motion sickness    planes   OSA (obstructive sleep apnea) 08/23/2016   CPAP   Osteoporosis    Presence of dental prosthetic device    implants   Radiculopathy of lumbosacral region 06/22/2016   Right lateral epicondylitis 06/04/2017   Right sided sciatica 03/09/2016   Sleep apnea     Past Surgical History:  Procedure Laterality Date   ABDOMINAL HYSTERECTOMY     CATARACT EXTRACTION Right 2020   COLONOSCOPY WITH PROPOFOL N/A 10/10/2019   Procedure: COLONOSCOPY WITH PROPOFOL;  Surgeon: Lucilla Lame, MD;  Location: [facility];  Service: Endoscopy;  Laterality: N/A;   ESOPHAGOGASTRODUODENOSCOPY (EGD) WITH PROPOFOL N/A 10/10/2019   Procedure: ESOPHAGOGASTRODUODENOSCOPY (EGD) WITH PROPOFOL;  Surgeon: Lucilla Lame, MD;  Location: [facility];  Service: Endoscopy;  Laterality: N/A;  Diabetic - insulin pump and oral meds sleep apnea   POLYPECTOMY  10/10/2019   Procedure: POLYPECTOMY;  Surgeon: Lucilla Lame, MD;  Location: [facility];  Service: Endoscopy;;    There were no vitals filed for this visit.   Subjective Assessment - 02/06/22 1524      Subjective  Ring finger is better but I think the pinky is still about the same.    Pertinent History She is having difficulty with left hand lifting, typing, jewelry and gripping- pinkie not work well.  Of note, she has cervical spondylosis with impingement at C4-C5, C3-C4.  She also has an accentuated central cord signal at C4-C5 that could be contributing to her radicular symptoms.  She is currently on gabapentin.  At Dr Elwyn Lade visit she was  interested in learning more about a cervical epidural steroid injection.  We discussed this at length including the risks and benefits associated with injection.  Patient states that she will think about this further and let us know if she would like to proceed.    Pt just seen PT and refer to OT for continues L hand involvement    Patient Stated Goals I want my pinkie and hand to work better so I can typing, pick up glass, lifting and hold objects    Currently in Pain? No/denies                [facility] OT Assessment - 02/06/22 0001       Strength   Right Hand Grip (lbs) 60    Right Hand Lateral Pinch 14 lbs    Right Hand 3 Point Pinch 10 lbs    Left Hand Grip (  lbs) 44    Left Hand Lateral Pinch 11 lbs    Left Hand 3 Point Pinch 10 lbs            Patient has active range of motion of second digit extension as well as thumb radial abduction/extension. Upon assessing strength patient 4 - over second digit extension, extensor pollicis longus and brevis for 4-5 strength. Patient can keep third and fourth digits in flexion while extending second digit but unable to do active range of motion fifth extension-can do place and hold. Patient with increased fourth digit extension able to tap of table few reps and increased ease when placed on hold. After weightbearing patient can maintain extension at fifth but no tapping.  Again placed on hold better than active motion. Reinforced several times during the session with patient about nerve healing as well  as progress can be slow.  Several times reinforce weightbearing prior to attempts for placed on hold of extension at fifth digit in several positions.  As well as tapping of fourth digit of table after weightbearing.  And can do some passive range of motion prior.   Preinforced with patient to do weightbearing prior to any attempts of reps and sets for home exercises to decrease flexor tightness !! Reinforced quality is more important than quantity of amount of reps and sets.   Patient able to do lumbrical fist better maintaining PIP extension.   Patient can continue with wrist radial ulnar deviation with the 2 pound weight.  But still decreased motion at ECU.   Patient onset of left upper extremity weakness and pain started with spasms and MRI of cervical spine showed some areas of impingement November of last year. Patient did not had a positive Tinel but was tender over dorsal wrist and proximal to wrist over radial nerve.                 OT Education - 02/06/22 1524     Education Details Anatomy was progress and nerve healing, home exercises    Person(s) Educated Patient    Methods Explanation;Demonstration;Tactile cues;Verbal cues;Handout    Comprehension Verbal cues required;Returned demonstration;Verbalized understanding                 OT Long Term Goals - 12/29/21 1814       OT LONG TERM GOAL #1   Title Patient to be independent in home program for weightbearing in between facilitation of correct quantity and position of home exercises to increase strength and ulnar side of hand    Baseline Patient needed max verbal cueing and moderate assistance and review of home exercises.  Decrease lumbrical and intrinsic a fist thumb on left side of hand.  Decreased ECU    Time 3    Period Weeks    Status New    Target Date 01/19/22      OT LONG TERM GOAL #2   Title Left fifth digit extension improved for patient to maintain full extension to be able to don gloves  and put hand in pocket.    Baseline MC extension -30 degrees at fifth and -20 degrees at fourth MC.-Unable to maintain PIP extension with MC flexed at 90 degrees.  Unable to tap for the fifth digit of the table.    Time 4    Period Weeks    Status New    Target Date 01/26/22      OT LONG TERM GOAL #3   Title Left wrist extension at  ECU improved for patient to be able to stabilize wrist and using three-point pinch to right and put on jewelry.    Baseline Patient with increased stability for wrist extension during applying earrings and writing; increase strength and ECR but decrease strength and ECU-wrist extension -60 with fourth and fifth digits in flexion    Time 6    Period Weeks    Status New    Target Date 02/09/22      OT LONG TERM GOAL #4   Title Intrinsic a fist and coordination improved for patient to be able to manipulate objects from palm to fingertips, type on computer and cut food with more ease    Baseline Decreased intrinsic a fist at fourth and fifth on left hand difficulty with manipulating objects from palm to fingertips with mostly fourth and fifth impaired; difficulty with maintaining power grip with ulnar side of hand while applying or use radial 3 digits.    Time 8    Period Weeks    Status New    Target Date 02/23/22                   Plan - 02/06/22 2007     Clinical Impression Statement Patient is a 76 year old  refer by Dr Holley Raring -  weakness with greatest loss in fifth digit. Pt denies any sensation issues. She is taking Gabapentin and that is helping for nerve pain. Pt do show decrease strength in 4th with 5th the worse for extention out of the [facility], as well as MC flexion with PIP extention at eval. Since eval pt showed increase extention of 4th - able this date to do tapping off table and place and hold 5th extention but not hyper extention or AROM into full extention - pt show decrease strength for ext indicis but has full 2nd digit extention - decrease  ECU and EPL and EPB- Grip increased. Pt show increase stability at wrist with 3 point.pinch. Review  again with pt nerve healing -and can take more than year- making progress but slow. Reinforce to do w weightbearing prior to any attempts of placing hold fifth digit extension as well as tapping..   Pt appear to cont to have some weakness from radial N involvement, but it is reassuring that sensation is intact.  Upon assessing for Tinel over radial nerve patient tender around wrist level and proximal to wrist.  Recommend for patient to follow-up in a month. Pt can benefit from skilled OT services to increase AROM and strength to increase functional use of L hand to prior level of function    OT Occupational Profile and History Problem Focused Assessment - Including review of records relating to presenting problem    Occupational performance deficits (Please refer to evaluation for details): ADL's;IADL's;Work;Play;Leisure;Social Participation    Body Structure / Function / Physical Skills ADL;Strength;Dexterity;UE functional use;IADL;ROM;Flexibility;Coordination    Rehab Potential Fair    Clinical Decision Making Limited treatment options, no task modification necessary    Comorbidities Affecting Occupational Performance: None    Modification or Assistance to Complete Evaluation  No modification of tasks or assist necessary to complete eval    OT Frequency 1x / week    OT Duration 8 weeks    OT Treatment/Interventions Self-care/ADL training;Therapeutic activities;Therapeutic exercise;Passive range of motion;Manual Therapy;Patient/family education    Consulted and Agree with Plan of Care Patient             Patient will benefit from skilled therapeutic intervention  in order to improve the following deficits and impairments:   Body Structure / Function / Physical Skills: ADL, Strength, Dexterity, UE functional use, IADL, ROM, Flexibility, Coordination       Visit Diagnosis: Muscle weakness  (generalized)  Radiculopathy, cervicothoracic region    Problem List 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   Chronic pain syndrome 03/17/2021   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/2021   Cervical paraspinal muscle spasm 02/10/2021   Gastroesophageal reflux disease 12/28/2020   Personal history of colonic polyps    Polyp of sigmoid colon    Abdominal pain, epigastric    Patient noncompliant with statin medication 09/12/2019   Type II diabetes mellitus with complication ([place]) 09/81/1914   Hyperlipidemia associated with type 2 diabetes mellitus ([place]) 05/17/2018   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 02/22/2018   Chronic midline thoracic back pain 02/22/2018   Tinea versicolor 09/20/2016   OSA (obstructive sleep apnea) 08/23/2016   Allergic rhinitis 07/13/2016   Overweight (BMI 25.0-29.9) 10/01/2015   Vitamin D deficiency 09/18/2014   Onychomycosis 09/16/2014   Degenerative disc disease, lumbar 09/16/2014   Benign essential HTN 10/10/2010   Osteoporosis 10/10/2010    Rosalyn Gess, OTR/L,CLT 02/06/2022, 8:13 PM  [facility] [address], [place], [address] Phone: [phone]   Fax:  [phone]  Name: Melinda Ford MRN: [id number] Date of Birth: [birth year]

## 2022-02-28 ENCOUNTER — Other Ambulatory Visit: Payer: Self-pay | Admitting: Endocrinology

## 2022-02-28 DIAGNOSIS — E1165 Type 2 diabetes mellitus with hyperglycemia: Secondary | ICD-10-CM

## 2022-03-09 ENCOUNTER — Ambulatory Visit: Payer: HMO | Attending: Student in an Organized Health Care Education/Training Program | Admitting: Occupational Therapy

## 2022-03-09 DIAGNOSIS — M79602 Pain in left arm: Secondary | ICD-10-CM | POA: Insufficient documentation

## 2022-03-09 DIAGNOSIS — M6281 Muscle weakness (generalized): Secondary | ICD-10-CM | POA: Diagnosis present

## 2022-03-09 NOTE — Therapy (Signed)
[facility] [address], [place], [address] Phone: [phone]   Fax:  [phone]  Occupational Therapy Treatment  Patient Details  Name: Melinda Ford MRN: [id number] Date of Birth: 1945-09-10 Referring Provider (OT): DR Holley Raring   Encounter Date: 03/09/2022   OT End of Session - 03/09/22 1319     Visit Number 5    Number of Visits 8    Date for OT Re-Evaluation 06/01/22    OT Start Time 1320    OT Stop Time 1348    OT Time Calculation (min) 28 min    Activity Tolerance Patient tolerated treatment well    Behavior During Therapy [facility] for tasks assessed/performed             Past Medical History:  Diagnosis Date   Allergy    Arthritis    Cataract    Diabetes mellitus without complication ([place])    Hyperlipidemia    Hypertension    Motion sickness    planes   OSA (obstructive sleep apnea) 08/23/2016   CPAP   Osteoporosis    Presence of dental prosthetic device    implants   Radiculopathy of lumbosacral region 06/22/2016   Right lateral epicondylitis 06/04/2017   Right sided sciatica 03/09/2016   Sleep apnea     Past Surgical History:  Procedure Laterality Date   ABDOMINAL HYSTERECTOMY     CATARACT EXTRACTION Right 2020   COLONOSCOPY WITH PROPOFOL N/A 10/10/2019   Procedure: COLONOSCOPY WITH PROPOFOL;  Surgeon: Lucilla Lame, MD;  Location: [facility];  Service: Endoscopy;  Laterality: N/A;   ESOPHAGOGASTRODUODENOSCOPY (EGD) WITH PROPOFOL N/A 10/10/2019   Procedure: ESOPHAGOGASTRODUODENOSCOPY (EGD) WITH PROPOFOL;  Surgeon: Lucilla Lame, MD;  Location: [facility];  Service: Endoscopy;  Laterality: N/A;  Diabetic - insulin pump and oral meds sleep apnea   POLYPECTOMY  10/10/2019   Procedure: POLYPECTOMY;  Surgeon: Lucilla Lame, MD;  Location: [facility] [place];  Service: Endoscopy;;    There were no vitals filed for this visit.   Subjective Assessment - 03/09/22 1319      Subjective  Its been month since I seen you last - pinky still mind of his own    Pertinent History She is having difficulty with left hand lifting, typing, jewelry and gripping- pinkie not work well.  Of note, she has cervical spondylosis with impingement at C4-C5, C3-C4.  She also has an accentuated central cord signal at C4-C5 that could be contributing to her radicular symptoms.  She is currently on gabapentin.  At Dr Elwyn Lade visit she was  interested in learning more about a cervical epidural steroid injection.  We discussed this at length including the risks and benefits associated with injection.  Patient states that she will think about this further and let us know if she would like to proceed.    Pt just seen PT and refer to OT for continues L hand involvement    Patient Stated Goals I want my pinkie and hand to work better so I can typing, pick up glass, lifting and hold objects    Currently in Pain? No/denies                [facility] OT Assessment - 03/09/22 0001       Strength   Right Hand Grip (lbs) 60    Right Hand Lateral Pinch 14 lbs    Right Hand 3 Point Pinch 10 lbs    Left Hand  Grip (lbs) 50    Left Hand Lateral Pinch 11 lbs    Left Hand 3 Point Pinch 10 lbs            Patient has active range of motion of second digit extension as well as thumb radial abduction/extension. Upon assessing strength patient 4 - over second digit extension, extensor pollicis longus and brevis for 4-5 strength. Patient can keep third and fourth digits in flexion while extending second digit but unable to do active range of motion fifth extension-can do place and hold for 5 sec. Patient with increased fourth digit extension able to tap of table few reps and increased ease . After weightbearing patient can maintain extension at fifth  at -15 3 -4 reps but no tapping.  Again placed on hold better than active motion. Reinforced several times during the session with patient about nerve healing  as well as progress can be slow.  Several times reinforce weightbearing prior to attempts for placed on hold of extension at fifth digit in several positions.  As well as tapping of fourth digit of table after weightbearing.  And can do some passive range of motion prior.     Reinforced quality is more important than quantity of amount of reps and sets.   Patient able to do lumbrical fist better maintaining PIP extension.   Patient can continue with wrist radial ulnar deviation with the 2 pound weight.  But still decreased motion at ECU.   Patient onset of left upper extremity weakness and pain started with spasms and MRI of cervical spine showed some areas of impingement November of last year. Patient did not had a positive Tinel but was tender over dorsal wrist and proximal to wrist over radial nerve month ago  And this date distal to wrist and hypo thenar eminence  Increase grip strength by 6 lbs                               OT Education - 03/09/22 1319     Education Details Anatomy was progress and nerve healing, home exercises    Person(s) Educated Patient    Methods Explanation;Demonstration;Tactile cues;Verbal cues;Handout    Comprehension Verbal cues required;Returned demonstration;Verbalized understanding                 OT Long Term Goals - 12/29/21 1814       OT LONG TERM GOAL #1   Title Patient to be independent in home program for weightbearing in between facilitation of correct quantity and position of home exercises to increase strength and ulnar side of hand    Baseline Patient needed max verbal cueing and moderate assistance and review of home exercises.  Decrease lumbrical and intrinsic a fist thumb on left side of hand.  Decreased ECU    Time    Period    Status MET   Target Date      OT LONG TERM GOAL #2   Title Left fifth digit extension improved for patient to maintain full extension to be able to don gloves and put hand in pocket.     Baseline MC extension -30 degrees at fifth and -20 degrees at fourth MC.-Unable to maintain PIP extension with MC flexed at 90 degrees.  Unable to tap for the fifth digit of the table. Extention -15 to -20 few reps    Time 12   Period Weeks    Status Ongoing  Target Date 06/01/22     OT LONG TERM GOAL #3   Title Left wrist extension at ECU improved for patient to be able to stabilize wrist and using three-point pinch to right and put on jewelry.    Baseline Patient with increased stability for wrist extension during applying earrings and writing; increase strength and ECR but decrease strength and ECU-wrist extension -60 with fourth and fifth digits in flexion    Time    Period    Status MET   Target Date      OT LONG TERM GOAL #4   Title Intrinsic a fist and coordination improved for patient to be able to manipulate objects from palm to fingertips, type on computer and cut food with more ease    Baseline Decreased intrinsic a fist at fourth and fifth on left hand difficulty with manipulating objects from palm to fingertips with mostly fourth and fifth impaired; difficulty with maintaining power grip with ulnar side of hand while applying or use radial 3 digits.    Time 12   Period Weeks    Status ongoing   Target Date 06/01/22                  Plan - 03/09/22 1320     Clinical Impression Statement Patient is a 76 year old  refer by Dr Holley Raring -  weakness with greatest loss in fifth digit. Pt denies any sensation issues. She is taking Gabapentin and that is helping for nerve pain. Pt do show decrease strength in 4th with 5th the worse for extention out of the [facility], as well as MC flexion with PIP extention at eval. Since eval pt showed increase extention of 4th - able this date to do tapping off table and place and hold 5th extention but not hyper extention or AROM into full extention - pt show decrease strength for ext indicis but has full 2nd digit extention - decrease ECU and  EPL and EPB- Grip increased 6 lbs. Pt show increase stability at wrist with 3 point.pinch. Review  again with pt nerve healing - progress the last month to ulnar hand - was month ago at wrist and prox to wrist. Reinforce to do w weightbearing prior to any attempts of placing hold fifth digit extension as well as tapping..   Pt appear to cont to have some weakness from radial N involvement, but it is reassuring that sensation is intact.   Recommend for patient to follow-up in a month. Pt can benefit from skilled OT services to increase AROM and strength to increase functional use of L hand to prior level of function    OT Occupational Profile and History Problem Focused Assessment - Including review of records relating to presenting problem    Occupational performance deficits (Please refer to evaluation for details): ADL's;IADL's;Work;Play;Leisure;Social Participation    Body Structure / Function / Physical Skills ADL;Strength;Dexterity;UE functional use;IADL;ROM;Flexibility;Coordination    Rehab Potential Fair    Clinical Decision Making Limited treatment options, no task modification necessary    Comorbidities Affecting Occupational Performance: None    Modification or Assistance to Complete Evaluation  No modification of tasks or assist necessary to complete eval    OT Frequency Biweekly    OT Duration 8 weeks    OT Treatment/Interventions Self-care/ADL training;Therapeutic activities;Therapeutic exercise;Passive range of motion;Manual Therapy;Patient/family education    Consulted and Agree with Plan of Care Patient             Patient will benefit  from skilled therapeutic intervention in order to improve the following deficits and impairments:   Body Structure / Function / Physical Skills: ADL, Strength, Dexterity, UE functional use, IADL, ROM, Flexibility, Coordination       Visit Diagnosis: Muscle weakness (generalized)  Pain in left arm    Problem List Patient Active Problem  List   Diagnosis Date Noted   Cervical radicular pain (left C5,6) 03/17/2021   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/2021   Chronic pain syndrome 03/17/2021   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/2021   Cervical paraspinal muscle spasm 02/10/2021   Gastroesophageal reflux disease 12/28/2020   Personal history of colonic polyps    Polyp of sigmoid colon    Abdominal pain, epigastric    Patient noncompliant with statin medication 09/12/2019   Type II diabetes mellitus with complication ([place]) 34/37/3578   Hyperlipidemia associated with type 2 diabetes mellitus ([place]) 05/17/2018   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 02/22/2018   Chronic midline thoracic back pain 02/22/2018   Tinea versicolor 09/20/2016   OSA (obstructive sleep apnea) 08/23/2016   Allergic rhinitis 07/13/2016   Overweight (BMI 25.0-29.9) 10/01/2015   Vitamin D deficiency 09/18/2014   Onychomycosis 09/16/2014   Degenerative disc disease, lumbar 09/16/2014   Benign essential HTN 10/10/2010   Osteoporosis 10/10/2010    Rosalyn Gess, OTR/L,CLT 03/09/2022, 2:03 PM  [facility] [address], [place], [address] Phone: [phone]   Fax:  [phone]  Name: Melinda Ford MRN: [id number] Date of Birth: May 25, 1945

## 2022-03-12 ENCOUNTER — Encounter: Payer: Self-pay | Admitting: Emergency Medicine

## 2022-03-12 ENCOUNTER — Ambulatory Visit
Admission: EM | Admit: 2022-03-12 | Discharge: 2022-03-12 | Disposition: A | Discharge status: Home / Self Care | Payer: HMO | Attending: Physician Assistant | Admitting: Physician Assistant

## 2022-03-12 DIAGNOSIS — R051 Acute cough: Secondary | ICD-10-CM | POA: Insufficient documentation

## 2022-03-12 DIAGNOSIS — B349 Viral infection, unspecified: Secondary | ICD-10-CM | POA: Insufficient documentation

## 2022-03-12 DIAGNOSIS — B338 Other specified viral diseases: Secondary | ICD-10-CM | POA: Insufficient documentation

## 2022-03-12 DIAGNOSIS — Z1152 Encounter for screening for COVID-19: Secondary | ICD-10-CM | POA: Diagnosis not present

## 2022-03-12 DIAGNOSIS — J029 Acute pharyngitis, unspecified: Secondary | ICD-10-CM | POA: Insufficient documentation

## 2022-03-12 LAB — RESP PANEL BY RT-PCR (RSV, FLU A&B, COVID)  RVPGX2
Influenza A by PCR: NEGATIVE
Influenza B by PCR: NEGATIVE
Resp Syncytial Virus by PCR: POSITIVE — AB
SARS Coronavirus 2 by RT PCR: NEGATIVE

## 2022-03-12 LAB — GROUP A STREP BY PCR: Group A Strep by PCR: NOT DETECTED

## 2022-03-12 NOTE — ED Provider Notes (Signed)
[facility]    CSN: [id number] Arrival date & time: 03/12/22  1124      History   Chief Complaint Chief Complaint  Patient presents with   Cough   Generalized Body Aches    HPI Melinda Ford is a 76 y.o. female presenting for cough, congestion, sore throat, body aches and fatigue x 2 days.  Patient denies any fever.  She says her throat was hurting terribly until she took an amoxicillin this morning.  She says she had this leftover from a old prescription and thought she could potentially have strep throat.  She denies any sinus pain, chest pain, shortness of breath, vomiting or diarrhea.  Has been around others with similar symptoms.  She says she thinks her sister may have gotten her sick because she had similar symptoms and they live in the same household.  Has been taking OTC decongestants for symptoms.  Medical history significant for diabetes, hypertension, hyperlipidemia, OSA.  HPI  Past Medical History:  Diagnosis Date   Allergy    Arthritis    Cataract    Diabetes mellitus without complication ([place])    Hyperlipidemia    Hypertension    Motion sickness    planes   OSA (obstructive sleep apnea) 08/23/2016   CPAP   Osteoporosis    Presence of dental prosthetic device    implants   Radiculopathy of lumbosacral region 06/22/2016   Right lateral epicondylitis 06/04/2017   Right sided sciatica 03/09/2016   Sleep apnea     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   Chronic pain syndrome 03/17/2021   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/2021   Cervical paraspinal muscle spasm 02/10/2021   Gastroesophageal reflux disease 12/28/2020   Personal history of colonic polyps    Polyp of sigmoid colon    Abdominal pain, epigastric    Patient noncompliant with statin medication 09/12/2019   Type II diabetes mellitus with complication ([place])  82/99/3716   Hyperlipidemia associated with type 2 diabetes mellitus ([place]) 05/17/2018   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 02/22/2018   Chronic midline thoracic back pain 02/22/2018   Tinea versicolor 09/20/2016   OSA (obstructive sleep apnea) 08/23/2016   Allergic rhinitis 07/13/2016   Overweight (BMI 25.0-29.9) 10/01/2015   Vitamin D deficiency 09/18/2014   Onychomycosis 09/16/2014   Degenerative disc disease, lumbar 09/16/2014   Benign essential HTN 10/10/2010   Osteoporosis 10/10/2010    Past Surgical History:  Procedure Laterality Date   ABDOMINAL HYSTERECTOMY     CATARACT EXTRACTION Right 2020   COLONOSCOPY WITH PROPOFOL N/A 10/10/2019   Procedure: COLONOSCOPY WITH PROPOFOL;  Surgeon: Lucilla Lame, MD;  Location: [facility];  Service: Endoscopy;  Laterality: N/A;   ESOPHAGOGASTRODUODENOSCOPY (EGD) WITH PROPOFOL N/A 10/10/2019   Procedure: ESOPHAGOGASTRODUODENOSCOPY (EGD) WITH PROPOFOL;  Surgeon: Lucilla Lame, MD;  Location: [facility] [facility];  Service: Endoscopy;  Laterality: N/A;  Diabetic - insulin pump and oral meds sleep apnea   POLYPECTOMY  10/10/2019   Procedure: POLYPECTOMY;  Surgeon: Lucilla Lame, MD;  Location: [facility];  Service: Endoscopy;;    OB History   No obstetric history on file.      Home Medications    Prior to Admission medications   Medication Sig Start Date End Date Taking? Authorizing Provider  acetaminophen (TYLENOL) 500 MG tablet Take 1,000 mg by mouth 2 (two) times daily.  As needed    [provider]  amLODipine (NORVASC) 10 MG tablet Take 1 tablet by mouth once daily 10/05/21   Glean Hess, MD  Calcium Carbonate-Vit D-Min (CALCIUM 1200 PO) Take 2 each by mouth daily.    [provider]  Cholecalciferol (VITAMIN D) 2000 UNITS CAPS Take 1 capsule (2,000 Units total) by mouth daily. 09/18/14   Plonk, Gwyndolyn Saxon, MD  clotrimazole-betamethasone (LOTRISONE) cream Apply 1 Application topically  daily. To feet and rash under breasts 09/20/21   Glean Hess, MD  Continuous Blood Gluc Sensor (FREESTYLE LIBRE 2 SENSOR) MISC Use as instructed 01/02/22   Glean Hess, MD  gabapentin (NEURONTIN) 100 MG capsule Take 1-3 capsules (100-300 mg total) by mouth at bedtime. Follow written titration schedule. 10/24/21 04/22/22  Gillis Santa, MD  GINKGO BILOBA COMPLEX PO Take 1 capsule by mouth daily.    [provider]  glucose blood (ONETOUCH VERIO) test strip USE 1 STRIP TO CHECK GLUCOSE 4 TIMES DAILY 06/13/21   Glean Hess, MD  insulin aspart (NOVOLOG) 100 UNIT/ML injection USE A MAX OF 90 UNITS DAILY VIA INSULIN PUMP 04/14/21   Elayne Snare, MD  Insulin Disposable Pump (V-GO 20) 20 UNIT/24HR KIT 1  ONCE DAILY 02/28/22   Elayne Snare, MD  Insulin Pen Needle (PEN NEEDLES) 32G X 5 MM MISC Please 11/04/21   Elayne Snare, MD  losartan (COZAAR) 100 MG tablet Take 1 tablet by mouth once daily 10/05/21   Glean Hess, MD  NON FORMULARY CPAP @@ 9 cm H2O nightly    [provider]  omeprazole (PRILOSEC) 20 MG capsule Take 1 capsule (20 mg total) by mouth 2 (two) times daily before a meal. Patient taking differently: Take 20 mg by mouth daily. 07/18/21   Glean Hess, MD  ondansetron (ZOFRAN) 4 MG tablet Take 1 tablet (4 mg total) by mouth every 8 (eight) hours as needed for nausea or vomiting. 12/26/21   Glean Hess, MD  pravastatin (PRAVACHOL) 20 MG tablet Take 1 tablet by mouth once daily 12/16/21   Elayne Snare, MD  Semaglutide, 1 MG/DOSE, (OZEMPIC, 1 MG/DOSE,) 4 MG/3ML SOPN Inject 1 mg weekly 09/05/21   Elayne Snare, MD  triamcinolone cream (KENALOG) 0.5 % Apply 1 Application topically 3 (three) times daily. To rash on neck 09/20/21   Glean Hess, MD    Family History Family History  Problem Relation Age of Onset   Breast cancer Other 28   Alcohol abuse Father    Mental illness Father    Stroke Father    Diabetes Sister    Asthma Brother    Diabetes Brother     Diabetes Maternal Aunt    Heart disease Maternal Aunt    Diabetes Maternal Uncle    Early death Maternal Uncle    Breast cancer Cousin 63       mat cousin    Social History Social History   Tobacco Use   Smoking status: Never   Smokeless tobacco: Never  Vaping Use   Vaping Use: Never used  Substance Use Topics   Alcohol use: Not Currently    Alcohol/week: 1.0 standard drink of alcohol    Types: 1 Standard drinks or equivalent per week   Drug use: Never     Allergies   Ace inhibitors, Wheat bran, and Gramineae pollens   Review of Systems Review of Systems  Constitutional:  Positive for fatigue. Negative for chills, diaphoresis and fever.  HENT:  Positive  for congestion, rhinorrhea and sore throat. Negative for ear pain, sinus pressure and sinus pain.   Respiratory:  Positive for cough. Negative for shortness of breath.   Gastrointestinal:  Negative for abdominal pain, nausea and vomiting.  Musculoskeletal:  Negative for arthralgias and myalgias.  Skin:  Negative for rash.  Neurological:  Negative for weakness and headaches.  Hematological:  Negative for adenopathy.     Physical Exam Triage Vital Signs ED Triage Vitals  Enc Vitals Group     BP      Pulse      Resp      Temp      Temp src      SpO2      Weight      Height      Head Circumference      Peak Flow      Pain Score      Pain Loc      Pain Edu?      Excl. in [place]?    No data found.  Updated Vital Signs BP (!) 162/78 (BP Location: Left Arm)   Pulse 78   Temp 98.6 F (37 C) (Oral)   Resp 14   Ht _0  (1.575 m)   Wt 156 lb (70.8 kg)   SpO2 98%   BMI 28.53 kg/m   Physical Exam Vitals and nursing note reviewed.  Constitutional:      General: She is not in acute distress.    Appearance: Normal appearance. She is not ill-appearing or toxic-appearing.  HENT:     Head: Normocephalic and atraumatic.     Nose: Congestion present.     Mouth/Throat:     Mouth: Mucous membranes are moist.      Pharynx: Oropharynx is clear. Posterior oropharyngeal erythema present.  Eyes:     General: No scleral icterus.       Right eye: No discharge.        Left eye: No discharge.     Conjunctiva/sclera: Conjunctivae normal.  Cardiovascular:     Rate and Rhythm: Normal rate and regular rhythm.     Heart sounds: Normal heart sounds.  Pulmonary:     Effort: Pulmonary effort is normal. No respiratory distress.     Breath sounds: Normal breath sounds.  Musculoskeletal:     Cervical back: Neck supple.  Skin:    General: Skin is dry.  Neurological:     General: No focal deficit present.     Mental Status: She is alert. Mental status is at baseline.     Motor: No weakness.     Gait: Gait normal.  Psychiatric:        Mood and Affect: Mood normal.        Behavior: Behavior normal.        Thought Content: Thought content normal.      UC Treatments / Results  Labs (all labs ordered are listed, but only abnormal results are displayed) Labs Reviewed  RESP PANEL BY RT-PCR (RSV, FLU A&B, COVID)  RVPGX2 - Abnormal; Notable for the following components:      Result Value   Resp Syncytial Virus by PCR POSITIVE (*)    All other components within normal limits  GROUP A STREP BY PCR    EKG   Radiology No results found.  Procedures Procedures (including critical care time)  Medications Ordered in UC Medications - No data to display  Initial Impression / Assessment and Plan / UC Course  I have  reviewed the triage vital signs and the nursing notes.  Pertinent labs & imaging results that were available during my care of the patient were reviewed by me and considered in my medical decision making (see chart for details).   76 year old female presents for cough, congestion, sore throat x 2 days.  She is afebrile and overall well-appearing.  On exam she has erythema posterior pharynx which is mild nasal congestion.  Chest clear to auscultation heart regular rate and rhythm.  PCR strep  test obtained as well as respiratory panel for COVID, flu and RSV.  Advised patient I will contact her with any positive results and recommend further treatment from there.  Contacted patient via phone on file.  No answer.  She returned the call but I was with another patient and nursing staff advised her I would contact her back.  She did not answer when I tried to contact her back.  She is positive for RSV.  I had advised her that her symptoms were consistent with viral illness and supportive care was encouraged.  Encouraged use of OTC cough medications, plenty rest and fluids.  Reviewed returning if fever, weakness or shortness of breath.   Final Clinical Impressions(s) / UC Diagnoses   Final diagnoses:  Viral illness  Acute cough  Sore throat  RSV infection     Discharge Instructions      -Will contact you with the results of your COVID, flu, RSV and strep testing.  It takes about an hour to control the results back.  Depending on the results I may send antibiotics or antiviral medication.  If positive for COVID need to isolate 5 days and wear mask x 5 days. - If all those test results are negative, you likely have another viral illness and care is supportive.  I can send cough medication and lidocaine for your symptoms.     ED Prescriptions   None    PDMP not reviewed this encounter.   Danton Clap, PA-C 03/12/22 1544

## 2022-03-12 NOTE — ED Triage Notes (Signed)
Patient c/o cough, congestion, and bodyaches that started late Friday.  Patient denies fevers.

## 2022-03-12 NOTE — Discharge Instructions (Addendum)
-  Will contact you with the results of your COVID, flu, RSV and strep testing.  It takes about an hour to control the results back.  Depending on the results I may send antibiotics or antiviral medication.  If positive for COVID need to isolate 5 days and wear mask x 5 days. - If all those test results are negative, you likely have another viral illness and care is supportive.  I can send cough medication and lidocaine for your symptoms.

## 2022-03-13 ENCOUNTER — Ambulatory Visit (INDEPENDENT_AMBULATORY_CARE_PROVIDER_SITE_OTHER): Payer: HMO | Admitting: Internal Medicine

## 2022-03-13 ENCOUNTER — Ambulatory Visit
Admission: RE | Admit: 2022-03-13 | Discharge: 2022-03-13 | Disposition: A | Discharge status: Home / Self Care | Payer: HMO | Attending: Internal Medicine | Admitting: Internal Medicine

## 2022-03-13 ENCOUNTER — Ambulatory Visit
Admission: RE | Admit: 2022-03-13 | Discharge: 2022-03-13 | Disposition: A | Discharge status: Home / Self Care | Payer: HMO | Source: Ambulatory Visit | Attending: Internal Medicine | Admitting: Internal Medicine

## 2022-03-13 ENCOUNTER — Encounter: Payer: Self-pay | Admitting: Internal Medicine

## 2022-03-13 VITALS — BP 128/70 | HR 92 | Temp 98.6°F | Ht 62.0 in | Wt 157.0 lb

## 2022-03-13 DIAGNOSIS — M25551 Pain in right hip: Secondary | ICD-10-CM

## 2022-03-13 DIAGNOSIS — G8929 Other chronic pain: Secondary | ICD-10-CM

## 2022-03-13 DIAGNOSIS — B338 Other specified viral diseases: Secondary | ICD-10-CM | POA: Diagnosis not present

## 2022-03-13 NOTE — Progress Notes (Signed)
Date:  03/13/2022   Name:  Melinda Ford   DOB:  Jun 24, 1945   MRN:  [id number]   Chief Complaint: Cough  Cough This is a new problem. The current episode started in the past 7 days. The problem has been unchanged. The problem occurs every few minutes. The cough is Non-productive. Pertinent negatives include no chest pain, chills, fever, shortness of breath or wheezing. She has tried OTC cough suppressant (and mucinex) for the symptoms.  Hip Pain  There was no injury mechanism. The pain is present in the right hip. The quality of the pain is described as burning and aching. The pain is mild. The pain has been Fluctuating since onset. Pertinent negatives include no muscle weakness, numbness or tingling. The symptoms are aggravated by weight bearing.    Lab Results  Component Value Date   NA 136 08/19/2021   K 4.2 08/19/2021   CO2 27 08/19/2021   GLUCOSE 206 (H) 08/19/2021   BUN 16 08/19/2021   CREATININE 0.67 08/19/2021   CALCIUM 10.1 08/19/2021   EGFR 82 07/01/2021   GFRNONAA 98 09/10/2015   Lab Results  Component Value Date   CHOL 203 (H) 08/19/2021   HDL 90.20 08/19/2021   LDLCALC 99 08/19/2021   TRIG 69.0 08/19/2021   CHOLHDL 2 08/19/2021   Lab Results  Component Value Date   TSH 1.650 12/28/2020   Lab Results  Component Value Date   HGBA1C 7.9 (A) 11/22/2021   Lab Results  Component Value Date   WBC 5.2 07/01/2021   HGB 14.5 07/01/2021   HCT 44.4 07/01/2021   MCV 93 07/01/2021   PLT 284 07/01/2021   Lab Results  Component Value Date   ALT 21 07/01/2021   AST 23 07/01/2021   ALKPHOS 102 07/01/2021   BILITOT 0.4 07/01/2021   Lab Results  Component Value Date   VD25OH 34.0 12/28/2020     Review of Systems  Constitutional:  Negative for chills, fatigue and fever.  HENT:  Negative for trouble swallowing.   Respiratory:  Positive for cough. Negative for chest tightness, shortness of breath and wheezing.   Cardiovascular:  Negative for chest pain and  leg swelling.  Gastrointestinal:  Negative for abdominal pain and constipation.  Musculoskeletal:  Positive for arthralgias.  Neurological:  Negative for tingling and numbness.  Psychiatric/Behavioral:  Negative for dysphoric mood and sleep disturbance. The patient is not nervous/anxious.     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   Chronic pain syndrome 03/17/2021   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/2021   Cervical paraspinal muscle spasm 02/10/2021   Gastroesophageal reflux disease 12/28/2020   Personal history of colonic polyps    Polyp of sigmoid colon    Abdominal pain, epigastric    Patient noncompliant with statin medication 09/12/2019   Type II diabetes mellitus with complication ([place]) 03/22/7587   Hyperlipidemia associated with type 2 diabetes mellitus ([place]) 05/17/2018   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 02/22/2018   Chronic midline thoracic back pain 02/22/2018   Tinea versicolor 09/20/2016   OSA (obstructive sleep apnea) 08/23/2016   Allergic rhinitis 07/13/2016   Overweight (BMI 25.0-29.9) 10/01/2015   Vitamin D deficiency 09/18/2014   Onychomycosis 09/16/2014   Degenerative disc disease, lumbar 09/16/2014   Benign essential HTN 10/10/2010   Osteoporosis 10/10/2010    Allergies  Allergen Reactions   Ace  Inhibitors Cough   Wheat Bran Hives   Gramineae Pollens     Past Surgical History:  Procedure Laterality Date   ABDOMINAL HYSTERECTOMY     CATARACT EXTRACTION Right 2020   COLONOSCOPY WITH PROPOFOL N/A 10/10/2019   Procedure: COLONOSCOPY WITH PROPOFOL;  Surgeon: Lucilla Lame, MD;  Location: [facility];  Service: Endoscopy;  Laterality: N/A;   ESOPHAGOGASTRODUODENOSCOPY (EGD) WITH PROPOFOL N/A 10/10/2019   Procedure: ESOPHAGOGASTRODUODENOSCOPY (EGD) WITH PROPOFOL;  Surgeon: Lucilla Lame, MD;  Location: [facility];  Service: Endoscopy;  Laterality: N/A;  Diabetic - insulin pump and oral meds sleep apnea   POLYPECTOMY  10/10/2019   Procedure: POLYPECTOMY;  Surgeon: Lucilla Lame, MD;  Location: [facility];  Service: Endoscopy;;    Social History   Tobacco Use   Smoking status: Never   Smokeless tobacco: Never  Vaping Use   Vaping Use: Never used  Substance Use Topics   Alcohol use: Not Currently    Alcohol/week: 1.0 standard drink of alcohol    Types: 1 Standard drinks or equivalent per week   Drug use: Never     Medication list has been reviewed and updated.  Current Meds  Medication Sig   acetaminophen (TYLENOL) 500 MG tablet Take 1,000 mg by mouth 2 (two) times daily. As needed   amLODipine (NORVASC) 10 MG tablet Take 1 tablet by mouth once daily   Calcium Carbonate-Vit D-Min (CALCIUM 1200 PO) Take 2 each by mouth daily.   Cholecalciferol (VITAMIN D) 2000 UNITS CAPS Take 1 capsule (2,000 Units total) by mouth daily.   clotrimazole-betamethasone (LOTRISONE) cream Apply 1 Application topically daily. To feet and rash under breasts   Continuous Blood Gluc Sensor (FREESTYLE LIBRE 2 SENSOR) MISC Use as instructed   gabapentin (NEURONTIN) 100 MG capsule Take 1-3 capsules (100-300 mg total) by mouth at bedtime. Follow written titration schedule.   GINKGO BILOBA COMPLEX PO Take 1 capsule by mouth daily.   glucose blood (ONETOUCH VERIO) test strip USE 1 STRIP TO CHECK GLUCOSE 4 TIMES DAILY   insulin aspart (NOVOLOG) 100 UNIT/ML injection USE A MAX OF 90 UNITS DAILY VIA INSULIN PUMP   Insulin Disposable Pump (V-GO 20) 20 UNIT/24HR KIT 1  ONCE DAILY   Insulin Pen Needle (PEN NEEDLES) 32G X 5 MM MISC Please   losartan (COZAAR) 100 MG tablet Take 1 tablet by mouth once daily   NON FORMULARY CPAP @@ 9 cm H2O nightly   omeprazole (PRILOSEC) 20 MG capsule Take 1 capsule (20 mg total) by mouth 2 (two) times daily before a meal. (Patient taking differently: Take 20 mg by mouth daily.)    ondansetron (ZOFRAN) 4 MG tablet Take 1 tablet (4 mg total) by mouth every 8 (eight) hours as needed for nausea or vomiting.   pravastatin (PRAVACHOL) 20 MG tablet Take 1 tablet by mouth once daily   Semaglutide, 1 MG/DOSE, (OZEMPIC, 1 MG/DOSE,) 4 MG/3ML SOPN Inject 1 mg weekly   triamcinolone cream (KENALOG) 0.5 % Apply 1 Application topically 3 (three) times daily. To rash on neck       12/26/2021    9:00 AM 09/20/2021    2:42 PM 07/01/2021   10:56 AM 03/17/2021   11:08 AM  GAD 7 : Generalized Anxiety Score  Nervous, Anxious, on Edge 0 0 0 0  Control/stop worrying 0 1 0 0  Worry too much - different things 1 1 0 0  Trouble relaxing 0 0 0 0  Restless 0 1 0 0  Easily annoyed or irritable 0 0 0 0  Afraid - awful might happen 0 0 0 0  Total GAD 7 Score 1 3 0 0  Anxiety Difficulty Not difficult at all Not difficult at all Not difficult at all Not difficult at all       12/26/2021    9:00 AM 09/20/2021    2:42 PM 08/15/2021   12:01 PM  Depression screen PHQ 2/9  Decreased Interest 0 0 0  Down, Depressed, Hopeless 0 0 0  PHQ - 2 Score 0 0 0  Altered sleeping 0 0   Tired, decreased energy 1 1   Change in appetite 0 1   Feeling bad or failure about yourself  0 0   Trouble concentrating 1 0   Moving slowly or fidgety/restless 0 1   Suicidal thoughts 0 0   PHQ-9 Score 2 3   Difficult doing work/chores Not difficult at all Not difficult at all     BP Readings from Last 3 Encounters:  03/13/22 128/70  03/12/22 (!) 162/78  12/26/21 124/78    Physical Exam Vitals and nursing note reviewed.  Constitutional:      General: She is not in acute distress.    Appearance: Normal appearance. She is well-developed.  HENT:     Head: Normocephalic and atraumatic.  Cardiovascular:     Rate and Rhythm: Normal rate and regular rhythm.     Pulses: Normal pulses.  Pulmonary:     Effort: Pulmonary effort is normal. No respiratory distress.     Breath sounds: No wheezing or rhonchi.      Comments: Frequent cough Musculoskeletal:     Cervical back: Normal range of motion.     Lumbar back: Negative right straight leg raise test and negative left straight leg raise test.     Right hip: No bony tenderness or crepitus. Normal range of motion.     Left hip: No bony tenderness or crepitus. Normal range of motion.     Right lower leg: No edema.     Left lower leg: No edema.  Lymphadenopathy:     Cervical: No cervical adenopathy.  Skin:    General: Skin is warm and dry.     Findings: No rash.  Neurological:     General: No focal deficit present.     Mental Status: She is alert and oriented to person, place, and time.  Psychiatric:        Mood and Affect: Mood normal.        Behavior: Behavior normal.     Wt Readings from Last 3 Encounters:  03/13/22 157 lb (71.2 kg)  03/12/22 156 lb (70.8 kg)  12/26/21 154 lb (69.9 kg)    BP 128/70   Pulse 92   Temp 98.6 F (37 C) (Oral)   Ht _0  (1.575 m)   Wt 157 lb (71.2 kg)   SpO2 98%   BMI 28.72 kg/m   Assessment and Plan: 1. RSV infection RSV detected yesterday. Exam stable, recommend conservative therapy with fluids and rest Mucinex and Delsym for cough.  2. Chronic right hip pain Continue Tylenol - can refer to Ortho or SM if needed - DG Hip Unilat W OR W/O Pelvis 2-3 Views Right; Future   Partially dictated using Editor, commissioning. Any errors are unintentional.  Halina Maidens, MD [facility]  03/13/2022

## 2022-03-13 NOTE — Patient Instructions (Signed)
Delsym cough syrup  Continue Mucus relief tabs  Fluids, rest

## 2022-03-14 ENCOUNTER — Ambulatory Visit: Payer: HMO | Admitting: Internal Medicine

## 2022-03-24 ENCOUNTER — Other Ambulatory Visit: Payer: Self-pay | Admitting: Internal Medicine

## 2022-03-24 NOTE — Telephone Encounter (Signed)
Requested Prescriptions  Pending Prescriptions Disp Refills   Continuous Blood Gluc Sensor (FREESTYLE LIBRE 2 SENSOR) MISC [Pharmacy Med Name: FREESTYLE LIBRE 2 SENSOR KIT] 2 each 0    Sig: USE AS DIRECTED     Endocrinology: Diabetes - Testing Supplies Passed - 03/24/2022 11:30 AM      Passed - Valid encounter within last 12 months    Recent Outpatient Visits           1 week ago RSV infection   [place] [facility], Jesse Sans, MD   2 months ago Viral gastroenteritis   [place] [facility] at [facility], Jesse Sans, MD   6 months ago Tinea pedis of both feet   [place] [facility] at [facility], Jesse Sans, MD   8 months ago Pharyngitis, unspecified etiology   [place] [facility] at [facility], Jesse Sans, MD   8 months ago Upper respiratory tract infection, unspecified type   [facility] at [facility], Jesse Sans, MD       Future Appointments             In 2 weeks Elayne Snare, MD [facility]

## 2022-03-25 ENCOUNTER — Other Ambulatory Visit: Payer: Self-pay | Admitting: Internal Medicine

## 2022-03-28 ENCOUNTER — Telehealth: Payer: Self-pay

## 2022-03-28 NOTE — Telephone Encounter (Signed)
PA completed waiting on insurance approval.  Key: [id number]  KP

## 2022-03-29 NOTE — Telephone Encounter (Signed)
Denied   KP

## 2022-03-30 NOTE — Telephone Encounter (Signed)
Called pt X2 times could not leave VM.  KP

## 2022-03-31 ENCOUNTER — Ambulatory Visit: Payer: Self-pay

## 2022-03-31 NOTE — Telephone Encounter (Signed)
  Chief Complaint: eye discharge  Symptoms: R eye redness and white drainage, slight swelling, vision changes Frequency: today  Pertinent Negatives: NA Disposition: '[]'$ ED /'[x]'$ Urgent Care (no appt availability in office) / '[]'$ Appointment(In office/virtual)/ '[]'$  [place] [facility]/ '[]'$ Home Care/ '[]'$ Refused Recommended Disposition /'[]'$ [place] [facility]/ '[]'$  Follow-up with PCP Additional Notes: pt has been sick and getting over RSV and still coughing and hoarse but now has sx with eye. Advised her to go to UC since weekend and practice is closed Monday. Pt states she will try and go. Unable to schedule appt with UC.   Summary: film of cold on eye   Pt stated a film of cold is covering her eye/ pt asked for advise / please advise     Reason for Disposition  Blurred vision  Answer Assessment - Initial Assessment Questions 1. EYE DISCHARGE: "Is the discharge in one or both eyes?" "What color is it?" "How much is there?" "When did the discharge start?"      R eye, white  2. REDNESS OF SCLERA: "Is the redness in one or both eyes?" "When did the redness start?"      yes 3. EYELIDS: "Are the eyelids red or swollen?" If Yes, ask: "How much?"      Slightly upper lid  4. VISION: "Is there any difficulty seeing clearly?"      Vision changes  5. PAIN: "Is there any pain? If Yes, ask: "How bad is it?" (Scale 1-10; or mild, moderate, severe)    - MILD (1-3): doesn't interfere with normal activities     - MODERATE (4-7): interferes with normal activities or awakens from sleep    - SEVERE (8-10): excruciating pain, unable to do any normal activities       no 6. CONTACT LENS: "Do you wear contacts?"     no 7. OTHER SYMPTOMS: "Do you have any other symptoms?" (e.g., fever, runny nose, cough)     Scratchy feeling  Protocols used: Eye - Pus or Discharge-A-AH

## 2022-04-01 ENCOUNTER — Ambulatory Visit
Admission: EM | Admit: 2022-04-01 | Discharge: 2022-04-01 | Disposition: A | Discharge status: Home / Self Care | Payer: HMO | Attending: Emergency Medicine | Admitting: Emergency Medicine

## 2022-04-01 DIAGNOSIS — J069 Acute upper respiratory infection, unspecified: Secondary | ICD-10-CM

## 2022-04-01 DIAGNOSIS — H1031 Unspecified acute conjunctivitis, right eye: Secondary | ICD-10-CM | POA: Diagnosis not present

## 2022-04-01 MED ORDER — BENZONATATE 100 MG PO CAPS: 200.0000 mg | ORAL_CAPSULE | Freq: Three times a day (TID) | ORAL | 0 refills | Status: DC

## 2022-04-01 MED ORDER — MOXIFLOXACIN HCL 0.5 % OP SOLN: 1.0000 [drp] | Freq: Three times a day (TID) | OPHTHALMIC | 0 refills | Status: AC

## 2022-04-01 MED ORDER — IPRATROPIUM BROMIDE 0.06 % NA SOLN: 2.0000 | Freq: Four times a day (QID) | NASAL | 12 refills | Status: DC

## 2022-04-01 MED ORDER — AMOXICILLIN-POT CLAVULANATE 875-125 MG PO TABS: 1.0000 | ORAL_TABLET | Freq: Two times a day (BID) | ORAL | 0 refills | Status: AC

## 2022-04-01 MED ORDER — PROMETHAZINE-DM 6.25-15 MG/5ML PO SYRP: 5.0000 mL | ORAL_SOLUTION | Freq: Four times a day (QID) | ORAL | 0 refills | Status: DC | PRN

## 2022-04-01 NOTE — ED Provider Notes (Signed)
[facility]    CSN: [id number] Arrival date & time: 04/01/22  6440      History   Chief Complaint Chief Complaint  Patient presents with   Conjunctivitis   Cough   Hoarse         HPI Melinda Ford is a 76 y.o. female.   HPI  76 year old female here for evaluation of ocular and respiratory complaints.  Patient reports that she has been experiencing redness to her right eye with mucousy drainage and itching since yesterday.  She was diagnosed with RSV on 03/12/2022 and states that she has continued to have a sore throat, nasal congestion with yellow nasal discharge, and a cough that is intermittently productive for dark yellow sputum.  She denies any fever, shortness breath, or wheezing.  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   Chronic pain syndrome 03/17/2021   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/2021   Cervical paraspinal muscle spasm 02/10/2021   Gastroesophageal reflux disease 12/28/2020   Personal history of colonic polyps    Polyp of sigmoid colon    Abdominal pain, epigastric    Patient noncompliant with statin medication 09/12/2019   Type II diabetes mellitus with complication ([place]) 34/74/2595   Hyperlipidemia associated with type 2 diabetes mellitus ([place]) 05/17/2018   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 02/22/2018   Chronic midline thoracic back pain 02/22/2018    Tinea versicolor 09/20/2016   OSA (obstructive sleep apnea) 08/23/2016   Allergic rhinitis 07/13/2016   Overweight (BMI 25.0-29.9) 10/01/2015   Vitamin D deficiency 09/18/2014   Onychomycosis 09/16/2014   Degenerative disc disease, lumbar 09/16/2014   Benign essential HTN 10/10/2010   Osteoporosis 10/10/2010    Past Surgical History:  Procedure Laterality Date   ABDOMINAL HYSTERECTOMY     CATARACT EXTRACTION Right 2020   COLONOSCOPY WITH PROPOFOL N/A 10/10/2019   Procedure: COLONOSCOPY WITH PROPOFOL;  Surgeon: Lucilla Lame, MD;  Location: [place];  Service: Endoscopy;  Laterality: N/A;   ESOPHAGOGASTRODUODENOSCOPY (EGD) WITH PROPOFOL N/A 10/10/2019   Procedure: ESOPHAGOGASTRODUODENOSCOPY (EGD) WITH PROPOFOL;  Surgeon: Lucilla Lame, MD;  Location: [place];  Service: Endoscopy;  Laterality: N/A;  Diabetic - insulin pump and oral meds sleep apnea   POLYPECTOMY  10/10/2019   Procedure: POLYPECTOMY;  Surgeon: Lucilla Lame, MD;  Location: [place];  Service: Endoscopy;;    OB History   No obstetric history on file.      Home Medications    Prior to Admission medications   Medication Sig Start Date End Date Taking? Authorizing Provider  acetaminophen (TYLENOL) 500 MG tablet Take 1,000 mg by mouth 2 (two) times daily. As needed   Yes [provider]  amLODipine (NORVASC) 10 MG tablet Take 1 tablet by mouth once daily 10/05/21  Yes Glean Hess, MD  amoxicillin-clavulanate (AUGMENTIN) [phone]  MG tablet Take 1 tablet by mouth every 12 (twelve) hours for 10 days. 04/01/22 04/11/22 Yes Margarette Canada, NP  benzonatate (TESSALON) 100 MG capsule Take 2 capsules (200 mg total) by mouth every 8 (eight) hours. 04/01/22  Yes Margarette Canada, NP  Calcium Carbonate-Vit D-Min (CALCIUM 1200 PO) Take 2 each by mouth daily.   Yes [provider]  Cholecalciferol (VITAMIN D) 2000 UNITS CAPS Take 1 capsule (2,000 Units total) by mouth daily. 09/18/14  Yes  Plonk, Gwyndolyn Saxon, MD  clotrimazole-betamethasone (LOTRISONE) cream Apply 1 Application topically daily. To feet and rash under breasts 09/20/21  Yes Glean Hess, MD  Continuous Blood Gluc Sensor (FREESTYLE LIBRE 2 SENSOR) MISC USE AS DIRECTED 03/24/22  Yes Glean Hess, MD  gabapentin (NEURONTIN) 100 MG capsule Take 1-3 capsules (100-300 mg total) by mouth at bedtime. Follow written titration schedule. 10/24/21 04/22/22 Yes Gillis Santa, MD  GINKGO BILOBA COMPLEX PO Take 1 capsule by mouth daily.   Yes [provider]  glucose blood (ONETOUCH VERIO) test strip USE 1 STRIP TO CHECK GLUCOSE 4 TIMES DAILY 06/13/21  Yes Glean Hess, MD  insulin aspart (NOVOLOG) 100 UNIT/ML injection USE A MAX OF 90 UNITS DAILY VIA INSULIN PUMP 04/14/21  Yes Elayne Snare, MD  Insulin Disposable Pump (V-GO 20) 20 UNIT/24HR KIT 1  ONCE DAILY 02/28/22  Yes Elayne Snare, MD  Insulin Pen Needle (PEN NEEDLES) 32G X 5 MM MISC Please 11/04/21  Yes Elayne Snare, MD  ipratropium (ATROVENT) 0.06 % nasal spray Place 2 sprays into both nostrils 4 (four) times daily. 04/01/22  Yes Margarette Canada, NP  losartan (COZAAR) 100 MG tablet Take 1 tablet by mouth once daily 10/05/21  Yes Glean Hess, MD  moxifloxacin (VIGAMOX) 0.5 % ophthalmic solution Place 1 drop into both eyes 3 (three) times daily for 7 days. 04/01/22 04/08/22 Yes Margarette Canada, NP  NON FORMULARY CPAP @@ 9 cm H2O nightly   Yes [provider]  omeprazole (PRILOSEC) 20 MG capsule Take 1 capsule (20 mg total) by mouth 2 (two) times daily before a meal. Patient taking differently: Take 20 mg by mouth daily. 07/18/21  Yes Glean Hess, MD  ondansetron (ZOFRAN) 4 MG tablet Take 1 tablet (4 mg total) by mouth every 8 (eight) hours as needed for nausea or vomiting. 12/26/21  Yes Glean Hess, MD  pravastatin (PRAVACHOL) 20 MG tablet Take 1 tablet by mouth once daily 12/16/21  Yes Elayne Snare, MD  promethazine-dextromethorphan (PROMETHAZINE-DM)  6.25-15 MG/5ML syrup Take 5 mLs by mouth 4 (four) times daily as needed. 04/01/22  Yes Margarette Canada, NP  Semaglutide, 1 MG/DOSE, (OZEMPIC, 1 MG/DOSE,) 4 MG/3ML SOPN Inject 1 mg weekly 09/05/21  Yes Elayne Snare, MD  triamcinolone cream (KENALOG) 0.5 % Apply 1 Application topically 3 (three) times daily. To rash on neck 09/20/21  Yes Glean Hess, MD    Family History Family History  Problem Relation Age of Onset   Breast cancer Other 88   Alcohol abuse Father    Mental illness Father    Stroke Father    Diabetes Sister    Asthma Brother    Diabetes Brother    Diabetes Maternal Aunt    Heart disease Maternal Aunt    Diabetes Maternal Uncle    Early death Maternal Uncle    Breast cancer Cousin 71       mat cousin    Social History Social History   Tobacco Use   Smoking status:  Never   Smokeless tobacco: Never  Vaping Use   Vaping Use: Never used  Substance Use Topics   Alcohol use: Not Currently    Alcohol/week: 1.0 standard drink of alcohol    Types: 1 Standard drinks or equivalent per week   Drug use: Never     Allergies   Ace inhibitors, Wheat bran, and Gramineae pollens   Review of Systems Review of Systems  Constitutional:  Negative for fever.  HENT:  Positive for congestion, rhinorrhea and sore throat. Negative for ear pain.   Eyes:  Positive for discharge, redness, itching and visual disturbance. Negative for photophobia.       Vision becomes blurry when she has a buildup of mucousy discharge but is otherwise clear.  Respiratory:  Positive for cough. Negative for shortness of breath and wheezing.      Physical Exam Triage Vital Signs ED Triage Vitals [04/01/22 0833]  Enc Vitals Group     BP      Pulse      Resp      Temp      Temp src      SpO2      Weight 156 lb (70.8 kg)     Height _0  (1.575 m)     Head Circumference      Peak Flow      Pain Score 0     Pain Loc      Pain Edu?      Excl. in [place]?    No data found.  Updated Vital  Signs BP (!) 153/81 (BP Location: Left Arm)   Pulse 60   Temp 97.6 F (36.4 C) (Oral)   Resp 16   Ht _1  (1.575 m)   Wt 156 lb (70.8 kg)   SpO2 100%   BMI 28.53 kg/m   Visual Acuity Right Eye Distance:   Left Eye Distance:   Bilateral Distance:    Right Eye Near:   Left Eye Near:    Bilateral Near:     Physical Exam Vitals and nursing note reviewed.  Constitutional:      Appearance: Normal appearance. She is not ill-appearing.  HENT:     Head: Normocephalic and atraumatic.     Right Ear: Tympanic membrane, ear canal and external ear normal. There is no impacted cerumen.     Left Ear: Tympanic membrane, ear canal and external ear normal. There is no impacted cerumen.     Nose: Congestion and rhinorrhea present.     Comments: His mucosa is erythematous and edematous with yellow discharge in both nares.    Mouth/Throat:     Mouth: Mucous membranes are moist.     Pharynx: Oropharynx is clear. Posterior oropharyngeal erythema present. No oropharyngeal exudate.     Comments: Posterior pharynx has mild erythema with yellow postnasal drip. Eyes:     General: No scleral icterus.       Right eye: Discharge present.     Extraocular Movements: Extraocular movements intact.     Conjunctiva/sclera: Conjunctivae normal.     Pupils: Pupils are equal, round, and reactive to light.     Comments: Mild erythema injection of labral bulbar conjunctiva of the right eye with mucopurulent discharge.  Upper and lower eyelids have mild edema but no erythema, fluctuance, or induration.  Cardiovascular:     Rate and Rhythm: Normal rate and regular rhythm.     Pulses: Normal pulses.     Heart sounds: Normal heart sounds. No murmur  heard.    No friction rub. No gallop.  Pulmonary:     Effort: Pulmonary effort is normal.     Breath sounds: Normal breath sounds. No wheezing, rhonchi or rales.  Musculoskeletal:     Cervical back: Normal range of motion and neck supple.  Lymphadenopathy:      Cervical: No cervical adenopathy.  Skin:    General: Skin is warm and dry.     Capillary Refill: Capillary refill takes less than 2 seconds.     Findings: No erythema or rash.  Neurological:     General: No focal deficit present.     Mental Status: She is alert and oriented to person, place, and time.  Psychiatric:        Mood and Affect: Mood normal.        Behavior: Behavior normal.        Thought Content: Thought content normal.        Judgment: Judgment normal.      UC Treatments / Results  Labs (all labs ordered are listed, but only abnormal results are displayed) Labs Reviewed - No data to display  EKG   Radiology No results found.  Procedures Procedures (including critical care time)  Medications Ordered in UC Medications - No data to display  Initial Impression / Assessment and Plan / UC Course  I have reviewed the triage vital signs and the nursing notes.  Pertinent labs & imaging results that were available during my care of the patient were reviewed by me and considered in my medical decision making (see chart for details).   Patient is a pleasant, nontoxic-appearing 76 year old female here for evaluation of ocular and respiratory complaints outlined HPI above.  Rectal exam does reveal injection erythema of the bulbar Liverel conjunctiva with mucopurulent discharge which is consistent with conjunctivitis.  There is mild edema of her upper eyelid but no erythema, fluctuance, or induration to suggest possible blepharitis.  I will treat her conjunctivitis with Vigamox 1 drop 3 times daily x 7 days.  Patient's upper respiratory tree does show inflammation and mucopurulent drainage in her nasal passages.  She has had upper respiratory symptoms for the past 20 days and was initially diagnosed with RSV.  My concern is that her respiratory symptoms have transition to a bacterial process given the continued nature and duration.  I will start her on Augmentin 875 twice daily  for 10 days for treatment of her upper respiratory infection.  I have also prescribed Atrovent nasal spray to help with the nasal congestion and Tessalon Perles to help with cough.  I will prescribe Promethazine DM cough syrup that she can use at bedtime.  I discussed that the DM may elevate her blood pressure but if taken only at bedtime that should not have too great of an effect.  I also informed her that it could make her drowsy so that if she gets up to go the bathroom in the night she needs to be conscious and make sure that she is steady before she tries to walk.  Patient verbalizes understanding of same.  We also discussed continued use of Mucinex and sinus irrigation to help alleviate mucus burden.  Return precautions reviewed.   Final Clinical Impressions(s) / UC Diagnoses   Final diagnoses:  Upper respiratory tract infection, unspecified type  Acute bacterial conjunctivitis of right eye     Discharge Instructions      Instill 1 drop of Vigamox in each eye every 8 hours for the  next 7 days for treatment of your conjunctivitis.  Avoid touching your eyes as much as possible.  Wipe down all surfaces, countertops, and doorknobs after the first and second 24 hours on eyedrops.  Wash her face with a clean wash rag to remove any drainage and use a different portion of the wash rag to clean each eye so as to not reinfect yourself.  The Augmentin twice daily with food for 10 days for treatment of your URI.  Perform sinus irrigation 2-3 times a day with a [employer] sinus rinse kit and distilled water.  Do not use tap water.  You can use plain over-the-counter Mucinex every 6 hours to break up the stickiness of the mucus so your body can clear it.  Increase your oral fluid intake to thin out your mucus so that is also able for your body to clear more easily.  Take an over-the-counter probiotic, such as Culturelle-align-activia, 1 hour after each dose of antibiotic to prevent  diarrhea.  Use the Atrovent nasal spray, 2 squirts in each nostril every 6 hours, as needed for runny nose and postnasal drip.  Use the Tessalon Perles every 8 hours during the day.  Take them with a small sip of water.  They may give you some numbness to the base of your tongue or a metallic taste in your mouth, this is normal.  Use the Promethazine DM cough syrup at bedtime for cough and congestion.  It will make you drowsy so do not take it during the day. This cam make you dizzy- be careful when taking this medication.  Return for reevaluation or see your primary care provider for any new or worsening symptoms.      ED Prescriptions     Medication Sig Dispense Auth. Provider   amoxicillin-clavulanate (AUGMENTIN) 875-125 MG tablet Take 1 tablet by mouth every 12 (twelve) hours for 10 days. 20 tablet Margarette Canada, NP   moxifloxacin (VIGAMOX) 0.5 % ophthalmic solution Place 1 drop into both eyes 3 (three) times daily for 7 days. 3 mL Margarette Canada, NP   benzonatate (TESSALON) 100 MG capsule Take 2 capsules (200 mg total) by mouth every 8 (eight) hours. 21 capsule Margarette Canada, NP   ipratropium (ATROVENT) 0.06 % nasal spray Place 2 sprays into both nostrils 4 (four) times daily. 15 mL Margarette Canada, NP   promethazine-dextromethorphan (PROMETHAZINE-DM) 6.25-15 MG/5ML syrup Take 5 mLs by mouth 4 (four) times daily as needed. 118 mL Margarette Canada, NP      PDMP not reviewed this encounter.   Margarette Canada, NP 04/01/22 [id number]

## 2022-04-01 NOTE — ED Triage Notes (Signed)
Pt c/o R eye draining & mucus x1 day states eye feels "cold". Also c/o cough,congestion, & sore throat ongoing since 12/10 was dx w/RSV sx's improved but returned.

## 2022-04-01 NOTE — Discharge Instructions (Signed)
Instill 1 drop of Vigamox in each eye every 8 hours for the next 7 days for treatment of your conjunctivitis.  Avoid touching your eyes as much as possible.  Wipe down all surfaces, countertops, and doorknobs after the first and second 24 hours on eyedrops.  Wash her face with a clean wash rag to remove any drainage and use a different portion of the wash rag to clean each eye so as to not reinfect yourself.  The Augmentin twice daily with food for 10 days for treatment of your URI.  Perform sinus irrigation 2-3 times a day with a [employer] sinus rinse kit and distilled water.  Do not use tap water.  You can use plain over-the-counter Mucinex every 6 hours to break up the stickiness of the mucus so your body can clear it.  Increase your oral fluid intake to thin out your mucus so that is also able for your body to clear more easily.  Take an over-the-counter probiotic, such as Culturelle-align-activia, 1 hour after each dose of antibiotic to prevent diarrhea.  Use the Atrovent nasal spray, 2 squirts in each nostril every 6 hours, as needed for runny nose and postnasal drip.  Use the Tessalon Perles every 8 hours during the day.  Take them with a small sip of water.  They may give you some numbness to the base of your tongue or a metallic taste in your mouth, this is normal.  Use the Promethazine DM cough syrup at bedtime for cough and congestion.  It will make you drowsy so do not take it during the day. This cam make you dizzy- be careful when taking this medication.  Return for reevaluation or see your primary care provider for any new or worsening symptoms.

## 2022-04-03 ENCOUNTER — Other Ambulatory Visit: Payer: Self-pay | Admitting: Endocrinology

## 2022-04-03 DIAGNOSIS — E1169 Type 2 diabetes mellitus with other specified complication: Secondary | ICD-10-CM

## 2022-04-03 DIAGNOSIS — E1165 Type 2 diabetes mellitus with hyperglycemia: Secondary | ICD-10-CM

## 2022-04-05 ENCOUNTER — Ambulatory Visit (INDEPENDENT_AMBULATORY_CARE_PROVIDER_SITE_OTHER): Payer: HMO | Admitting: Internal Medicine

## 2022-04-05 ENCOUNTER — Encounter: Payer: Self-pay | Admitting: Internal Medicine

## 2022-04-05 VITALS — BP 128/76 | HR 71 | Temp 98.4°F | Ht 62.0 in | Wt 164.6 lb

## 2022-04-05 DIAGNOSIS — J069 Acute upper respiratory infection, unspecified: Secondary | ICD-10-CM | POA: Diagnosis not present

## 2022-04-05 DIAGNOSIS — B370 Candidal stomatitis: Secondary | ICD-10-CM | POA: Diagnosis not present

## 2022-04-05 MED ORDER — FLUCONAZOLE 100 MG PO TABS: 100.0000 mg | ORAL_TABLET | Freq: Every day | ORAL | 0 refills | Status: AC

## 2022-04-05 NOTE — Progress Notes (Signed)
Date:  04/05/2022   Name:  Melinda Ford   DOB:  1945/08/12   MRN:  [id number]   Chief Complaint: URI  URI  This is a new problem. Episode onset: started f12/2023 with RSV. There has been no fever. Associated symptoms include congestion, coughing, a plugged ear sensation and a sore throat (and hoarseness now). Pertinent negatives include no chest pain or wheezing. Treatments tried: currently on day 4/10 Augmentin and cough syrup,nasal spray.    Lab Results  Component Value Date   NA 136 08/19/2021   K 4.2 08/19/2021   CO2 27 08/19/2021   GLUCOSE 206 (H) 08/19/2021   BUN 16 08/19/2021   CREATININE 0.67 08/19/2021   CALCIUM 10.1 08/19/2021   EGFR 82 07/01/2021   GFRNONAA 98 09/10/2015   Lab Results  Component Value Date   CHOL 203 (H) 08/19/2021   HDL 90.20 08/19/2021   LDLCALC 99 08/19/2021   TRIG 69.0 08/19/2021   CHOLHDL 2 08/19/2021   Lab Results  Component Value Date   TSH 1.650 12/28/2020   Lab Results  Component Value Date   HGBA1C 7.9 (A) 11/22/2021   Lab Results  Component Value Date   WBC 5.2 07/01/2021   HGB 14.5 07/01/2021   HCT 44.4 07/01/2021   MCV 93 07/01/2021   PLT 284 07/01/2021   Lab Results  Component Value Date   ALT 21 07/01/2021   AST 23 07/01/2021   ALKPHOS 102 07/01/2021   BILITOT 0.4 07/01/2021   Lab Results  Component Value Date   VD25OH 34.0 12/28/2020     Review of Systems  Constitutional:  Negative for chills, fatigue and fever.  HENT:  Positive for congestion, sore throat (and hoarseness now) and voice change.        Dry mouth and throat  Respiratory:  Positive for cough. Negative for chest tightness, shortness of breath and wheezing.   Cardiovascular:  Negative for chest pain.    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   Chronic pain syndrome 03/17/2021   Cervical spondylosis with radiculopathy  02/10/2021   Cervical paraspinal muscle spasm 02/10/2021   Gastroesophageal reflux disease 12/28/2020   Personal history of colonic polyps    Polyp of sigmoid colon    Abdominal pain, epigastric    Patient noncompliant with statin medication 09/12/2019   Type II diabetes mellitus with complication ([place]) 84/66/5993   Hyperlipidemia associated with type 2 diabetes mellitus ([place]) 05/17/2018   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 02/22/2018   Chronic midline thoracic back pain 02/22/2018   Tinea versicolor 09/20/2016   OSA (obstructive sleep apnea) 08/23/2016   Allergic rhinitis 07/13/2016   Overweight (BMI 25.0-29.9) 10/01/2015   Vitamin D deficiency 09/18/2014   Onychomycosis 09/16/2014   Degenerative disc disease, lumbar 09/16/2014   Benign essential HTN 10/10/2010   Osteoporosis 10/10/2010    Allergies  Allergen Reactions   Ace Inhibitors Cough   Wheat Bran Hives   Gramineae Pollens     Past Surgical History:  Procedure Laterality Date   ABDOMINAL HYSTERECTOMY     CATARACT EXTRACTION Right 2020   COLONOSCOPY WITH PROPOFOL N/A 10/10/2019   Procedure: COLONOSCOPY WITH PROPOFOL;  Surgeon: Lucilla Lame, MD;  Location: [facility];  Service: Endoscopy;  Laterality: N/A;   ESOPHAGOGASTRODUODENOSCOPY (EGD) WITH PROPOFOL N/A 10/10/2019   Procedure: ESOPHAGOGASTRODUODENOSCOPY (EGD) WITH PROPOFOL;  Surgeon: Allen Norris,  Darren, MD;  Location: [facility];  Service: Endoscopy;  Laterality: N/A;  Diabetic - insulin pump and oral meds sleep apnea   POLYPECTOMY  10/10/2019   Procedure: POLYPECTOMY;  Surgeon: Lucilla Lame, MD;  Location: [facility];  Service: Endoscopy;;    Social History   Tobacco Use   Smoking status: Never   Smokeless tobacco: Never  Vaping Use   Vaping Use: Never used  Substance Use Topics   Alcohol use: Not Currently    Alcohol/week: 1.0 standard drink of alcohol    Types: 1 Standard drinks or equivalent per week   Drug use:  Never     Medication list has been reviewed and updated.  Current Meds  Medication Sig   acetaminophen (TYLENOL) 500 MG tablet Take 1,000 mg by mouth 2 (two) times daily. As needed   amLODipine (NORVASC) 10 MG tablet Take 1 tablet by mouth once daily   amoxicillin-clavulanate (AUGMENTIN) 875-125 MG tablet Take 1 tablet by mouth every 12 (twelve) hours for 10 days.   benzonatate (TESSALON) 100 MG capsule Take 2 capsules (200 mg total) by mouth every 8 (eight) hours.   Calcium Carbonate-Vit D-Min (CALCIUM 1200 PO) Take 2 each by mouth daily.   Cholecalciferol (VITAMIN D) 2000 UNITS CAPS Take 1 capsule (2,000 Units total) by mouth daily.   clotrimazole-betamethasone (LOTRISONE) cream Apply 1 Application topically daily. To feet and rash under breasts   Continuous Blood Gluc Sensor (FREESTYLE LIBRE 2 SENSOR) MISC USE AS DIRECTED   fluconazole (DIFLUCAN) 100 MG tablet Take 1 tablet (100 mg total) by mouth daily for 3 days. Hold pravastatin while taking this medication   gabapentin (NEURONTIN) 100 MG capsule Take 1-3 capsules (100-300 mg total) by mouth at bedtime. Follow written titration schedule.   GINKGO BILOBA COMPLEX PO Take 1 capsule by mouth daily.   glucose blood (ONETOUCH VERIO) test strip USE 1 STRIP TO CHECK GLUCOSE 4 TIMES DAILY   insulin aspart (NOVOLOG) 100 UNIT/ML injection USE A MAX OF 90 UNITS DAILY VIA INSULIN PUMP   Insulin Disposable Pump (V-GO 20) 20 UNIT/24HR KIT 1  ONCE DAILY   Insulin Pen Needle (PEN NEEDLES) 32G X 5 MM MISC Please   ipratropium (ATROVENT) 0.06 % nasal spray Place 2 sprays into both nostrils 4 (four) times daily.   losartan (COZAAR) 100 MG tablet Take 1 tablet by mouth once daily   moxifloxacin (VIGAMOX) 0.5 % ophthalmic solution Place 1 drop into both eyes 3 (three) times daily for 7 days.   NON FORMULARY CPAP @@ 9 cm H2O nightly   omeprazole (PRILOSEC) 20 MG capsule Take 1 capsule (20 mg total) by mouth 2 (two) times daily before a meal. (Patient  taking differently: Take 20 mg by mouth daily.)   ondansetron (ZOFRAN) 4 MG tablet Take 1 tablet (4 mg total) by mouth every 8 (eight) hours as needed for nausea or vomiting.   pravastatin (PRAVACHOL) 20 MG tablet Take 1 tablet by mouth once daily   promethazine-dextromethorphan (PROMETHAZINE-DM) 6.25-15 MG/5ML syrup Take 5 mLs by mouth 4 (four) times daily as needed.   Semaglutide, 1 MG/DOSE, (OZEMPIC, 1 MG/DOSE,) 4 MG/3ML SOPN Inject 1 mg weekly   triamcinolone cream (KENALOG) 0.5 % Apply 1 Application topically 3 (three) times daily. To rash on neck       04/05/2022    1:49 PM 03/13/2022   11:41 AM 12/26/2021    9:00 AM 09/20/2021    2:42 PM  GAD 7 : Generalized Anxiety Score  Nervous, Anxious, on Edge 0 1 0 0  Control/stop worrying 0 1 0 1  Worry too much - different things 1 0 1 1  Trouble relaxing 0 0 0 0  Restless 0 0 0 1  Easily annoyed or irritable 0 0 0 0  Afraid - awful might happen 0 0 0 0  Total GAD 7 Score _0 Anxiety Difficulty Not difficult at all Not difficult at all Not difficult at all Not difficult at all       04/05/2022    1:49 PM 03/13/2022   11:41 AM 12/26/2021    9:00 AM  Depression screen PHQ 2/9  Decreased Interest 0 0 0  Down, Depressed, Hopeless 0 0 0  PHQ - 2 Score 0 0 0  Altered sleeping 0 0 0  Tired, decreased energy 1 0 1  Change in appetite 0 0 0  Feeling bad or failure about yourself  0 0 0  Trouble concentrating 0 0 1  Moving slowly or fidgety/restless 0 1 0  Suicidal thoughts 0 0 0  PHQ-9 Score _1 Difficult doing work/chores Not difficult at all Not difficult at all Not difficult at all    BP Readings from Last 3 Encounters:  04/05/22 128/76  04/01/22 (!) 153/81  03/13/22 128/70    Physical Exam Vitals and nursing note reviewed.  Constitutional:      General: She is not in acute distress.    Appearance: Normal appearance. She is well-developed.  HENT:     Head: Normocephalic and atraumatic.     Mouth/Throat:     Mouth:  Mucous membranes are moist.     Comments: Pink tongue with white patches on buccal surface Cardiovascular:     Rate and Rhythm: Normal rate and regular rhythm.  Pulmonary:     Effort: Pulmonary effort is normal. No respiratory distress.     Breath sounds: No wheezing or rhonchi.  Musculoskeletal:     Cervical back: Normal range of motion.  Lymphadenopathy:     Cervical: No cervical adenopathy.  Skin:    General: Skin is warm and dry.     Findings: No rash.  Neurological:     Mental Status: She is alert and oriented to person, place, and time.  Psychiatric:        Mood and Affect: Mood normal.        Behavior: Behavior normal.     Wt Readings from Last 3 Encounters:  04/05/22 164 lb 9.6 oz (74.7 kg)  04/01/22 156 lb (70.8 kg)  03/13/22 157 lb (71.2 kg)    BP 128/76   Pulse 71   Temp 98.4 F (36.9 C) (Oral)   Ht _2  (1.575 m)   Wt 164 lb 9.6 oz (74.7 kg)   SpO2 98%   BMI 30.11 kg/m   Assessment and Plan: Problem List Items Addressed This Visit   None Visit Diagnoses     Thrush, oral    -  Primary   Relevant Medications   fluconazole (DIFLUCAN) 100 MG tablet   Upper respiratory tract infection, unspecified type       stop promethazine - Delsym instead finish Augmentin expect cough to taper off over several weeks   Relevant Medications   fluconazole (DIFLUCAN) 100 MG tablet        Partially dictated using Editor, commissioning. Any errors are unintentional.  Halina Maidens, MD [facility]  04/05/2022

## 2022-04-06 ENCOUNTER — Ambulatory Visit: Payer: HMO | Attending: Student in an Organized Health Care Education/Training Program | Admitting: Occupational Therapy

## 2022-04-06 ENCOUNTER — Other Ambulatory Visit: Payer: HMO

## 2022-04-06 DIAGNOSIS — M5413 Radiculopathy, cervicothoracic region: Secondary | ICD-10-CM | POA: Diagnosis not present

## 2022-04-06 DIAGNOSIS — M79602 Pain in left arm: Secondary | ICD-10-CM | POA: Insufficient documentation

## 2022-04-06 DIAGNOSIS — M6281 Muscle weakness (generalized): Secondary | ICD-10-CM | POA: Insufficient documentation

## 2022-04-06 NOTE — Therapy (Signed)
[facility] [address], [place], [address] Phone: [phone]   Fax:  [phone]  Occupational Therapy Treatment  Patient Details  Name: Melinda Ford MRN: [id number] Date of Birth: Feb 02, 1946 Referring Provider (OT): DR Holley Raring   Encounter Date: 04/06/2022   OT End of Session - 04/06/22 1818     Visit Number 6    Number of Visits 8    Date for OT Re-Evaluation 06/01/22    OT Start Time 0945    OT Stop Time 1024    OT Time Calculation (min) 39 min    Activity Tolerance Patient tolerated treatment well    Behavior During Therapy [facility] for tasks assessed/performed             Past Medical History:  Diagnosis Date   Allergy    Arthritis    Cataract    Diabetes mellitus without complication ([place])    Hyperlipidemia    Hypertension    Motion sickness    planes   OSA (obstructive sleep apnea) 08/23/2016   CPAP   Osteoporosis    Presence of dental prosthetic device    implants   Radiculopathy of lumbosacral region 06/22/2016   Right lateral epicondylitis 06/04/2017   Right sided sciatica 03/09/2016   Sleep apnea     Past Surgical History:  Procedure Laterality Date   ABDOMINAL HYSTERECTOMY     CATARACT EXTRACTION Right 2020   COLONOSCOPY WITH PROPOFOL N/A 10/10/2019   Procedure: COLONOSCOPY WITH PROPOFOL;  Surgeon: Lucilla Lame, MD;  Location: [facility];  Service: Endoscopy;  Laterality: N/A;   ESOPHAGOGASTRODUODENOSCOPY (EGD) WITH PROPOFOL N/A 10/10/2019   Procedure: ESOPHAGOGASTRODUODENOSCOPY (EGD) WITH PROPOFOL;  Surgeon: Lucilla Lame, MD;  Location: [facility];  Service: Endoscopy;  Laterality: N/A;  Diabetic - insulin pump and oral meds sleep apnea   POLYPECTOMY  10/10/2019   Procedure: POLYPECTOMY;  Surgeon: Lucilla Lame, MD;  Location: [facility];  Service: Endoscopy;;    There were no vitals filed for this visit.   Subjective Assessment - 04/06/22 1625      Subjective  I did not do to much since I seen you last -was sick since I seen you last - weak still    Pertinent History She is having difficulty with left hand lifting, typing, jewelry and gripping- pinkie not work well.  Of note, she has cervical spondylosis with impingement at C4-C5, C3-C4.  She also has an accentuated central cord signal at C4-C5 that could be contributing to her radicular symptoms.  She is currently on gabapentin.  At Dr Elwyn Lade visit she was  interested in learning more about a cervical epidural steroid injection.  We discussed this at length including the risks and benefits associated with injection.  Patient states that she will think about this further and let us know if she would like to proceed.    Pt just seen PT and refer to OT for continues L hand involvement    Patient Stated Goals I want my pinkie and hand to work better so I can typing, pick up glass, lifting and hold objects    Currently in Pain? No/denies                [facility] OT Assessment - 04/06/22 0001       Strength   Right Hand Lateral Pinch 14 lbs    Right Hand 3 Point Pinch 11 lbs    Left Hand Lateral Pinch  11 lbs    Left Hand 3 Point Pinch 8 lbs             4th digit MC extention -5 and 5th -20 degrees    Patient has active range of motion of second digit extension as well as thumb radial abduction/extension. Upon assessing strength patient 4 - over second digit extension, extensor pollicis longus and brevis for 4-5 strength. Patient can keep third and fourth digits in flexion while extending second digit but unable to do active range of motion fifth extension-can do place and hold for 5 sec. Patient with increased fourth digit extension able to tap of table few reps and increased ease . After weightbearing patient can maintain extension at fifth  at -15 3 -4 reps but no tapping.  Again placed on hold better than active motion. Reinforced several times during the session with patient  about nerve healing as well as progress can be slow.  Several times reinforce weightbearing prior to attempts for placed on hold of extension at fifth digit in several positions.  As well as tapping of fourth digit of table after weightbearing.  And can do some passive range of motion prior.     Reinforced quality is more important than quantity of amount of reps and sets.   Patient able to do lumbrical fist better maintaining PIP extension.   Patient can  correct wrist extention against gravity to midline and do AROM with 1 lbs against gravity partial range 3-4 reps Also can do 2 lbs at side of body UD 10 reps -but need mod v/c and t/c to not go into flexion at wrist .    Patient onset of left upper extremity weakness and pain started with spasms and MRI of cervical spine showed some areas of impingement November of last year. Patient did not had a positive Tinel but was tender over dorsal wrist and proximal to wrist over radial nerve 2 months ago  And  last months distal to wrist and hypo thenar eminence  And today at [facility] Strength decrease in grip but pt was sick the last month - still weak                       OT Education - 04/06/22 1818     Education Details Anatomy was progress and nerve healing, home exercises    Person(s) Educated Patient    Methods Explanation;Demonstration;Tactile cues;Verbal cues;Handout    Comprehension Verbal cues required;Returned demonstration;Verbalized understanding                 OT Long Term Goals - 12/29/21 1814       OT LONG TERM GOAL #1   Title Patient to be independent in home program for weightbearing in between facilitation of correct quantity and position of home exercises to increase strength and ulnar side of hand    Baseline Patient needed max verbal cueing and moderate assistance and review of home exercises.  Decrease lumbrical and intrinsic a fist thumb on left side of hand.  Decreased ECU    Time 3    Period  Weeks    Status New    Target Date 01/19/22      OT LONG TERM GOAL #2   Title Left fifth digit extension improved for patient to maintain full extension to be able to don gloves and put hand in pocket.    Baseline MC extension -30 degrees at fifth and -20 degrees at fourth MC.-Unable  to maintain PIP extension with MC flexed at 90 degrees.  Unable to tap for the fifth digit of the table.    Time 4    Period Weeks    Status New    Target Date 01/26/22      OT LONG TERM GOAL #3   Title Left wrist extension at ECU improved for patient to be able to stabilize wrist and using three-point pinch to right and put on jewelry.    Baseline Patient with increased stability for wrist extension during applying earrings and writing; increase strength and ECR but decrease strength and ECU-wrist extension -60 with fourth and fifth digits in flexion    Time 6    Period Weeks    Status New    Target Date 02/09/22      OT LONG TERM GOAL #4   Title Intrinsic a fist and coordination improved for patient to be able to manipulate objects from palm to fingertips, type on computer and cut food with more ease    Baseline Decreased intrinsic a fist at fourth and fifth on left hand difficulty with manipulating objects from palm to fingertips with mostly fourth and fifth impaired; difficulty with maintaining power grip with ulnar side of hand while applying or use radial 3 digits.    Time 8    Period Weeks    Status New    Target Date 02/23/22                   Plan - 04/06/22 1819     Clinical Impression Statement Patient is a 77 year old  refer by Dr Holley Raring -  weakness with greatest loss in fifth digit. Pt denies any sensation issues. She is taking Gabapentin and that is helping for nerve pain. Pt do show decrease strength in 4th with 5th the worse for extention out of the [facility], as well as MC flexion with PIP extention at eval. Since eval pt showed increase extention of 4th - able this date to do tapping  off table and place and hold 5th extention but not hyper extention or AROM into full extention - pt show decrease strength for ext indicis but has full 2nd digit extention - decrease ECU against gravity and EPL and EPB- Grip in bilateral hands  decrease this date since pt had been sick the last month.  Pt show increase stability at wrist with 3 point.pinch. Review  again with pt nerve healing - progress  today to [facility] - 2 months ago it was at wrist and prox to wrist. Reinforce to do weightbearing prior to any attempts of placing hold fifth digit extension as well as tapping..   Pt appear to cont to have some weakness from radial N involvement, but it is reassuring that sensation is intact.   Semmes Weinstein testing normal. Pt wanst to follow up in month. Pt can benefit from skilled OT services to increase AROM and strength to increase functional use of L hand to prior level of function    OT Occupational Profile and History Problem Focused Assessment - Including review of records relating to presenting problem    Occupational performance deficits (Please refer to evaluation for details): ADL's;IADL's;Work;Play;Leisure;Social Participation    Body Structure / Function / Physical Skills ADL;Strength;Dexterity;UE functional use;IADL;ROM;Flexibility;Coordination    Rehab Potential Fair    Clinical Decision Making Limited treatment options, no task modification necessary    Comorbidities Affecting Occupational Performance: None    Modification or Assistance to Complete Evaluation  No modification of tasks or assist necessary to complete eval    OT Frequency Monthly    OT Duration 8 weeks    OT Treatment/Interventions Self-care/ADL training;Therapeutic activities;Therapeutic exercise;Passive range of motion;Manual Therapy;Patient/family education    Consulted and Agree with Plan of Care Patient             Patient will benefit from skilled therapeutic intervention in order to improve the following  deficits and impairments:   Body Structure / Function / Physical Skills: ADL, Strength, Dexterity, UE functional use, IADL, ROM, Flexibility, Coordination       Visit Diagnosis: Muscle weakness (generalized)  Pain in left arm  Radiculopathy, cervicothoracic region    Problem List 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   Chronic pain syndrome 03/17/2021   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/2021   Cervical paraspinal muscle spasm 02/10/2021   Gastroesophageal reflux disease 12/28/2020   Personal history of colonic polyps    Polyp of sigmoid colon    Abdominal pain, epigastric    Patient noncompliant with statin medication 09/12/2019   Type II diabetes mellitus with complication ([place]) 83/41/9622   Hyperlipidemia associated with type 2 diabetes mellitus ([place]) 05/17/2018   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 02/22/2018   Chronic midline thoracic back pain 02/22/2018   Tinea versicolor 09/20/2016   OSA (obstructive sleep apnea) 08/23/2016   Allergic rhinitis 07/13/2016   Overweight (BMI 25.0-29.9) 10/01/2015   Vitamin D deficiency 09/18/2014   Onychomycosis 09/16/2014   Degenerative disc disease, lumbar 09/16/2014   Benign essential HTN 10/10/2010   Osteoporosis 10/10/2010    Rosalyn Gess, OTR/L,CLT 04/06/2022, 6:22 PM  [facility] [address], [place], [address] Phone: [phone]   Fax:  [phone]  Name: Melinda Ford MRN: [id number] Date of Birth: 09/24/1945

## 2022-04-10 ENCOUNTER — Other Ambulatory Visit: Payer: Self-pay | Admitting: Internal Medicine

## 2022-04-10 DIAGNOSIS — B370 Candidal stomatitis: Secondary | ICD-10-CM

## 2022-04-11 ENCOUNTER — Ambulatory Visit: Payer: HMO | Admitting: Endocrinology

## 2022-04-12 ENCOUNTER — Ambulatory Visit (INDEPENDENT_AMBULATORY_CARE_PROVIDER_SITE_OTHER): Payer: HMO | Admitting: Endocrinology

## 2022-04-12 ENCOUNTER — Encounter: Payer: Self-pay | Admitting: Endocrinology

## 2022-04-12 ENCOUNTER — Telehealth: Payer: Self-pay

## 2022-04-12 ENCOUNTER — Other Ambulatory Visit: Payer: Self-pay | Admitting: Endocrinology

## 2022-04-12 VITALS — BP 138/82 | HR 67 | Ht 62.0 in | Wt 158.0 lb

## 2022-04-12 DIAGNOSIS — E1169 Type 2 diabetes mellitus with other specified complication: Secondary | ICD-10-CM | POA: Diagnosis not present

## 2022-04-12 DIAGNOSIS — E1165 Type 2 diabetes mellitus with hyperglycemia: Secondary | ICD-10-CM

## 2022-04-12 DIAGNOSIS — E785 Hyperlipidemia, unspecified: Secondary | ICD-10-CM

## 2022-04-12 DIAGNOSIS — Z794 Long term (current) use of insulin: Secondary | ICD-10-CM

## 2022-04-12 LAB — COMPREHENSIVE METABOLIC PANEL
ALT: 63 U/L — ABNORMAL HIGH (ref 0–35)
AST: 49 U/L — ABNORMAL HIGH (ref 0–37)
Albumin: 3.9 g/dL (ref 3.5–5.2)
Alkaline Phosphatase: 87 U/L (ref 39–117)
BUN: 19 mg/dL (ref 6–23)
CO2: 28 mEq/L (ref 19–32)
Calcium: 9.8 mg/dL (ref 8.4–10.5)
Chloride: 105 mEq/L (ref 96–112)
Creatinine, Ser: 0.7 mg/dL (ref 0.40–1.20)
GFR: 83.34 mL/min (ref >60.00)
Glucose, Bld: 105 mg/dL — ABNORMAL HIGH (ref 70–99)
Potassium: 4.1 mEq/L (ref 3.5–5.1)
Sodium: 140 mEq/L (ref 135–145)
Total Bilirubin: 0.4 mg/dL (ref 0.2–1.2)
Total Protein: 7.3 g/dL (ref 6.0–8.3)

## 2022-04-12 LAB — LIPID PANEL
Cholesterol: 199 mg/dL (ref 0–200)
HDL: 84.5 mg/dL (ref >39.00)
LDL Cholesterol: 97 mg/dL (ref 0–99)
NonHDL: 114.52
Total CHOL/HDL Ratio: 2
Triglycerides: 90 mg/dL (ref 0.0–149.0)
VLDL: 18 mg/dL (ref 0.0–40.0)

## 2022-04-12 LAB — POCT GLYCOSYLATED HEMOGLOBIN (HGB A1C): Hemoglobin A1C: 8 % — AB (ref 4.0–5.6)

## 2022-04-12 MED ORDER — INSULIN LISPRO 100 UNIT/ML IJ SOLN: INTRAMUSCULAR | 5 refills | Status: DC

## 2022-04-12 NOTE — Addendum Note (Signed)
Addended by: Cinda Quest on: 04/12/2022 04:09 PM   Modules accepted: Orders

## 2022-04-12 NOTE — Telephone Encounter (Signed)
Novolog is higher. Insulin Lispro is preferred. Please advise.

## 2022-04-12 NOTE — Progress Notes (Signed)
Patient ID: Melinda Ford, female   DOB: 1945-11-18, 77 y.o.   MRN: [id number]            Reason for Appointment: Follow-up for Type 2 Diabetes   History of Present Illness:          Date of diagnosis of type 2 diabetes mellitus: ?  2004        Background history:   She previously had been on metformin and also Avandia initially Detailed records of her care are not available prior to 2013 or so She was tried on Januvia in 2016 but this was not effective and this was stopped in 08/2015 She was started on insulin in 2/16 with small doses of Lantus insulin Her A1c has been consistently over 8% since about 2013  Recent history:   INSULIN regimen is:   V-go 20 unit pump since 06/25/38, boluses 0-4 clicks with meals     Non-insulin hypoglycemic drugs : Ozempic 0.5 weekly  Her A1c is same at 8%   Current management, blood sugar patterns and problems identified: Although she was doing fairly well in August with monitoring and somewhat better compliance with insulin boluses she has not followed any instructions lately Previously her time in range on the sensor was 72% and now only 44% However she has checked her sugars only very sporadically with only 36% active CGM time This indicates significant HYPERGLYCEMIA after her evening meal at least and likely also whenever she has documented breakfast or lunch meals  She is likely bolusing only when the freestyle Elenor Legato is alarming for high sugars and she forgets to bolus Occasionally with bolusing late she will have a low blood sugar around bedtime  Overnight blood sugars are also variable and difficult to assess  She has however continued her Ozempic, dialing half of the 1 mg pen        Side effects from medications have been: nausea and diarrhea from 1000 MG metformin  Compliance with the medical regimen: fair  Previous CGM data: . CGM use % of time 36  2-week average/GV   Time in range        44%  % Time Above 180 21+21  % Time  above 250   % Time Below 70      PRE-MEAL Fasting Lunch Dinner Bedtime Overall  Glucose range:       Averages: ?  173     POST-MEAL PC Breakfast PC Lunch PC Dinner  Glucose range:   254  Averages:      Prior   CGM use % of time   2-week average/GV 151/32  Time in range      72%  % Time Above 180 23+3  % Time above 250   % Time Below 70 2     PRE-MEAL Fasting Lunch Dinner Bedtime Overall  Glucose range:       Averages: 111 168 142     POST-MEAL PC Breakfast PC Lunch PC Dinner  Glucose range:     Averages: 168 178 185    Self-care:   Meal times are:  Breakfast is at 10 AM, dinner 8, lunch 2-3 pm  Typical meal intake: Breakfast is toast, oatmeal, sometimes bacon, milk   Lunch: Sometimes hot dogs, fish sandwich Evening meal is a protein like chicken, steak, pork chop, corn, greens.           Dietician visit, most recent: 04/2021  CDE visit: 12/2015   Weight history:  Wt Readings  from Last 3 Encounters:  04/12/22 158 lb (71.7 kg)  04/05/22 164 lb 9.6 oz (74.7 kg)  04/01/22 156 lb (70.8 kg)    Glycemic control:   Lab Results  Component Value Date   HGBA1C 8.0 (A) 04/12/2022   HGBA1C 7.9 (A) 11/22/2021   HGBA1C 8.0 (A) 08/19/2021   Lab Results  Component Value Date   MICROALBUR 1.1 11/22/2021   LDLCALC 97 04/12/2022   CREATININE 0.70 04/12/2022   Lab Results  Component Value Date   MICRALBCREAT 1.4 11/22/2021    Other active problems: See review of systems   Allergies as of 04/12/2022       Reactions   Ace Inhibitors Cough   Wheat Bran Hives   Gramineae Pollens         Medication List        Accurate as of April 12, 2022  3:57 PM. If you have any questions, ask your nurse or doctor.          acetaminophen 500 MG tablet Commonly known as: TYLENOL Take 1,000 mg by mouth 2 (two) times daily. As needed   amLODipine 10 MG tablet Commonly known as: NORVASC Take 1 tablet by mouth once daily   benzonatate 100 MG capsule Commonly  known as: TESSALON Take 2 capsules (200 mg total) by mouth every 8 (eight) hours.   CALCIUM 1200 PO Take 2 each by mouth daily.   clotrimazole-betamethasone cream Commonly known as: LOTRISONE Apply 1 Application topically daily. To feet and rash under breasts   FreeStyle Libre 2 Sensor Misc USE AS DIRECTED   gabapentin 100 MG capsule Commonly known as: Neurontin Take 1-3 capsules (100-300 mg total) by mouth at bedtime. Follow written titration schedule.   GINKGO BILOBA COMPLEX PO Take 1 capsule by mouth daily.   insulin aspart 100 UNIT/ML injection Commonly known as: novoLOG USE A MAX OF 90 UNITS DAILY VIA INSULIN PUMP   ipratropium 0.06 % nasal spray Commonly known as: ATROVENT Place 2 sprays into both nostrils 4 (four) times daily.   losartan 100 MG tablet Commonly known as: COZAAR Take 1 tablet by mouth once daily   NON FORMULARY CPAP @@ 9 cm H2O nightly   omeprazole 20 MG capsule Commonly known as: PRILOSEC Take 1 capsule (20 mg total) by mouth 2 (two) times daily before a meal. What changed: when to take this   ondansetron 4 MG tablet Commonly known as: Zofran Take 1 tablet (4 mg total) by mouth every 8 (eight) hours as needed for nausea or vomiting.   OneTouch Verio test strip Generic drug: glucose blood USE 1 STRIP TO CHECK GLUCOSE 4 TIMES DAILY   Ozempic (1 MG/DOSE) 4 MG/3ML Sopn Generic drug: Semaglutide (1 MG/DOSE) Inject 1 mg weekly   Pen Needles 32G X 5 MM Misc Please   pravastatin 20 MG tablet Commonly known as: PRAVACHOL Take 1 tablet by mouth once daily   promethazine-dextromethorphan 6.25-15 MG/5ML syrup Commonly known as: PROMETHAZINE-DM Take 5 mLs by mouth 4 (four) times daily as needed.   triamcinolone cream 0.5 % Commonly known as: KENALOG Apply 1 Application topically 3 (three) times daily. To rash on neck   V-Go 20 20 UNIT/24HR Kit 1  ONCE DAILY   Vitamin D 50 MCG (2000 UT) Caps Take 1 capsule (2,000 Units total) by mouth  daily.        Allergies:  Allergies  Allergen Reactions   Ace Inhibitors Cough   Wheat Bran Hives   Gramineae Pollens  Past Medical History:  Diagnosis Date   Allergy    Arthritis    Cataract    Diabetes mellitus without complication (HCC)    Hyperlipidemia    Hypertension    Motion sickness    planes   OSA (obstructive sleep apnea) 08/23/2016   CPAP   Osteoporosis    Presence of dental prosthetic device    implants   Radiculopathy of lumbosacral region 06/22/2016   Right lateral epicondylitis 06/04/2017   Right sided sciatica 03/09/2016   Sleep apnea     Past Surgical History:  Procedure Laterality Date   ABDOMINAL HYSTERECTOMY     CATARACT EXTRACTION Right 2020   COLONOSCOPY WITH PROPOFOL N/A 10/10/2019   Procedure: COLONOSCOPY WITH PROPOFOL;  Surgeon: Lucilla Lame, MD;  Location: [place];  Service: Endoscopy;  Laterality: N/A;   ESOPHAGOGASTRODUODENOSCOPY (EGD) WITH PROPOFOL N/A 10/10/2019   Procedure: ESOPHAGOGASTRODUODENOSCOPY (EGD) WITH PROPOFOL;  Surgeon: Lucilla Lame, MD;  Location: [place];  Service: Endoscopy;  Laterality: N/A;  Diabetic - insulin pump and oral meds sleep apnea   POLYPECTOMY  10/10/2019   Procedure: POLYPECTOMY;  Surgeon: Lucilla Lame, MD;  Location: [place];  Service: Endoscopy;;    Family History  Problem Relation Age of Onset   Breast cancer Other 84   Alcohol abuse Father    Mental illness Father    Stroke Father    Diabetes Sister    Asthma Brother    Diabetes Brother    Diabetes Maternal Aunt    Heart disease Maternal Aunt    Diabetes Maternal Uncle    Early death Maternal Uncle    Breast cancer Cousin 54       mat cousin    Social History:  reports that she has never smoked. She has never used smokeless tobacco. She reports that she does not currently use alcohol after a past usage of about 1.0 standard drink of alcohol per week. She reports that she does not use drugs.   Review of  Systems  Lipid history: Treated with pravastatin 3 times a week Although previously she had tolerated this well she is asking not taking pravastatin possibly causing muscle cramps, previously had concerned about with muscle aches Has not discussed with PCP  Last LDL:  Lab Results  Component Value Date   CHOL 199 04/12/2022   CHOL 203 (H) 08/19/2021   CHOL 211 (H) 10/01/2020   Lab Results  Component Value Date   HDL 84.50 04/12/2022   HDL 90.20 08/19/2021   HDL 87.00 10/01/2020   Lab Results  Component Value Date   LDLCALC 97 04/12/2022   LDLCALC 99 08/19/2021   LDLCALC 111 (H) 10/01/2020   Lab Results  Component Value Date   TRIG 90.0 04/12/2022   TRIG 69.0 08/19/2021   TRIG 63.0 10/01/2020   Lab Results  Component Value Date   CHOLHDL 2 04/12/2022   CHOLHDL 2 08/19/2021   CHOLHDL 2 10/01/2020   No results found for: "LDLDIRECT"          Hypertension: on treatment for several years, on amlodipine and losartan   Has been followed by PCP  BP Readings from Last 3 Encounters:  04/12/22 138/82  04/05/22 128/76  04/01/22 (!) 153/81     Most recent foot exam: 12/2019  OSTEOPOROSIS: Followed by PCP, lowest T score was- 2.6 at the spine compared to -3.0 before She was previously taking alendronate Also has had longstanding vitamin D deficiency  Has been taking 4000  units of vitamin D3   Lab Results  Component Value Date   VD25OH 34.0 12/28/2020   VD25OH 40.70 02/17/2020   VD25OH 28.44 (L) 10/22/2019   VD25OH 29.1 (L) 04/20/2017   VD25OH 24.92 (L) 05/18/2016   VD25OH 31.3 07/28/2015   VD25OH 26.9 (L) 03/17/2015   VD25OH 21.5 (L) 09/17/2014      Physical Examination:  BP 138/82   Pulse 67   Ht '5\' 2"'$  (1.575 m)   Wt 158 lb (71.7 kg)   SpO2 98%   BMI 28.90 kg/m        ASSESSMENT:  Diabetes type 2 on insulin  See history of present illness for detailed discussion of current diabetes management, blood sugar patterns and problems  identified  She is currently on insulin with the V-go pump and weekly Ozempic 0.5  A1c is around the same at 8  She has significant issues with compliance with monitoring, will bolusing before meals and likely variability in the diet Also unable to exercise much  HYPERTENSION: Has good control with current regimen   PLAN:   She was reminded to bolus before starting to eat and discussed that she is getting into significant hyperglycemia and high alerts with not bolusing before eating She may be able to have her husband reminded to bolus Also likely can benefit from using the Dexcom instead of the Redan 2 sensor because of lack of adequate monitoring Discussed in detail the differences between Dexcom and freestyle libre version 2 Likely can get the reader as a sample for the Dexcom and we will start this in the office as a sample Likely will need to bolus for clicks but this can be adjusted based on meal size at various meals She will continue Ozempic  She can try to leave off her pravastatin for couple of weeks to see if her cramping is better, to have labs checked today   Patient Instructions  Check sugar 4x daily  Click it before eating it!!     Elayne Snare 04/12/2022, 3:57 PM   Note: This office note was prepared with Dragon voice recognition system technology. Any transcriptional errors that result from this process are unintentional.  Total visit time including counseling = 30 minutes

## 2022-04-12 NOTE — Patient Instructions (Signed)
Check sugar 4x daily  Click it before eating it!!

## 2022-04-18 ENCOUNTER — Other Ambulatory Visit: Payer: Self-pay | Admitting: Internal Medicine

## 2022-04-18 ENCOUNTER — Telehealth: Payer: Self-pay | Admitting: Internal Medicine

## 2022-04-18 DIAGNOSIS — B370 Candidal stomatitis: Secondary | ICD-10-CM

## 2022-04-18 NOTE — Telephone Encounter (Signed)
Pt called and is upset because she has been requesting this for days, wants to know what she needs to do to get this filled.

## 2022-04-18 NOTE — Telephone Encounter (Signed)
Pt calling back to make sure script received at 10:17

## 2022-04-18 NOTE — Telephone Encounter (Signed)
Requested medication (s) are due for refill today - no  Requested medication (s) are on the active medication list -no  Future visit scheduled -no  Last refill: 04/05/22  Notes to clinic: Rx refused by office- patient is calling back requesting this medication again.  Requested Prescriptions  Pending Prescriptions Disp Refills   fluconazole (DIFLUCAN) 100 MG tablet 3 tablet 0    Sig: Take 1 tablet (100 mg total) by mouth daily for 3 days. Hold pravastatin while taking this medication     Off-Protocol Failed - 04/18/2022  3:37 PM      Failed - Medication not assigned to a protocol, review manually.      Passed - Valid encounter within last 12 months    Recent Outpatient Visits           1 week ago Ritta Slot, oral   [place] [facility] at [facility], Jesse Sans, MD   1 month ago RSV infection   [place] [facility] at [facility], Jesse Sans, MD   3 months ago Viral gastroenteritis   [place] [facility] at [facility], Jesse Sans, MD   7 months ago Tinea pedis of both feet   [place] [facility] at [facility], Jesse Sans, MD   9 months ago Pharyngitis, unspecified etiology   [place] [facility] at [facility], Jesse Sans, MD       Future Appointments             In 3 months Elayne Snare, MD [facility] Endocrinology               Requested Prescriptions  Pending Prescriptions Disp Refills   fluconazole (DIFLUCAN) 100 MG tablet 3 tablet 0    Sig: Take 1 tablet (100 mg total) by mouth daily for 3 days. Hold pravastatin while taking this medication     Off-Protocol Failed - 04/18/2022  3:37 PM      Failed - Medication not assigned to a protocol, review manually.      Passed - Valid encounter within last 12 months    Recent Outpatient Visits           1 week ago Ritta Slot, oral   [place]  [facility] at [facility], Jesse Sans, MD   1 month ago RSV infection   [place] [facility] at [facility], Jesse Sans, MD   3 months ago Viral gastroenteritis   [place] [facility] at [facility], Jesse Sans, MD   7 months ago Tinea pedis of both feet   [place] [facility] at [facility], Jesse Sans, MD   9 months ago Pharyngitis, unspecified etiology   [place] [facility] at [facility], Jesse Sans, MD       Future Appointments             In 3 months Elayne Snare, MD [facility] Endocrinology

## 2022-04-18 NOTE — Telephone Encounter (Signed)
Copied from [place] [phone]. Topic: General - Other >> Apr 18, 2022  2:39 PM Everette C wrote: Reason for CRM: Medication Refill - Medication: fluconazole (DIFLUCAN) 100 MG tablet [[id number]]  Has the patient contacted their pharmacy? No.   (Agent: If no, request that the patient contact the pharmacy for the refill. If patient does not wish to contact the pharmacy document the reason why and proceed with request.) (Agent: If yes, when and what did the pharmacy advise?)  Preferred Pharmacy (with phone number or street name): [place], [place] - [address] [place] [address] Phone: [phone] Fax: [phone] Hours: Not open 24 hours   Has the patient been seen for an appointment in the last year OR does the patient have an upcoming appointment? Yes.    Agent: Please be advised that RX refills may take up to 3 business days. We ask that you follow-up with your pharmacy.

## 2022-04-18 NOTE — Telephone Encounter (Signed)
Patient called in today very upset that she has been trying to get this medication for several days and has not been able to get it. Patient would like a call back explaining what is the reason she can not get this medication. The Medication is Dyflucan.

## 2022-04-18 NOTE — Telephone Encounter (Signed)
Please advise 

## 2022-04-19 ENCOUNTER — Ambulatory Visit: Payer: HMO | Admitting: Family Medicine

## 2022-04-19 ENCOUNTER — Other Ambulatory Visit: Payer: Self-pay

## 2022-04-19 MED ORDER — FLUCONAZOLE 150 MG PO TABS: 150.0000 mg | ORAL_TABLET | Freq: Once | ORAL | 0 refills | Status: DC

## 2022-04-19 NOTE — Telephone Encounter (Signed)
This is not a chronic medication

## 2022-04-20 ENCOUNTER — Ambulatory Visit (INDEPENDENT_AMBULATORY_CARE_PROVIDER_SITE_OTHER): Payer: HMO | Admitting: Family Medicine

## 2022-04-20 ENCOUNTER — Encounter: Payer: Self-pay | Admitting: Family Medicine

## 2022-04-20 VITALS — BP 128/80 | HR 86 | Temp 98.0°F | Ht 62.0 in | Wt 156.0 lb

## 2022-04-20 DIAGNOSIS — J020 Streptococcal pharyngitis: Secondary | ICD-10-CM | POA: Diagnosis not present

## 2022-04-20 MED ORDER — PROMETHAZINE-DM 6.25-15 MG/5ML PO SYRP: 5.0000 mL | ORAL_SOLUTION | Freq: Four times a day (QID) | ORAL | 0 refills | Status: DC | PRN

## 2022-04-20 MED ORDER — AZITHROMYCIN 250 MG PO TABS: ORAL_TABLET | ORAL | 0 refills | Status: AC

## 2022-04-20 NOTE — Progress Notes (Signed)
     Primary Care / Sports Medicine Office Visit  Patient Information:  Patient ID: Melinda Ford, female DOB: 1945-04-06 Age: 77 y.o. MRN: [id number]   Melinda Ford is a pleasant 77 y.o. female presenting with the following:  Chief Complaint  Patient presents with   Sore Throat    Been sick since December, right side throat pain every morning, cough, nasal drainage.     Vitals:   04/20/22 1057  BP: 128/80  Pulse: 86  Temp: 98 F (36.7 C)  SpO2: 98%   Vitals:   04/20/22 1057  Weight: 156 lb (70.8 kg)  Height: '5\' 2"'$  (1.575 m)   Body mass index is 28.53 kg/m.  No results found.   Independent interpretation of notes and tests performed by another provider:   None  Procedures performed:   None  Pertinent History, Exam, Impression, and Recommendations:   Melinda Ford was seen today for sore throat.  Strep pharyngitis Assessment & Plan: Patient presents for dysphagia ongoing since the December timeframe, focal right side of the throat involving cough, thick drainage and secretions, right ear pressure.  She denies any fevers, chills, no shortness of air, no chest pain.  Of note, she did Present to [facility] urgent care on 03/12/2022, 04/01/2022, and with PCP Dr. Army Melia on 04/05/2022.  After her visit with Dr. Army Melia she did endorse improvement with Diflucan with concern over oral thrush, however she self discontinued Augmentin prior to completion of course from initial Rx for 10 days starting 03/12/2022.  (EMR records reviewed x 3)  Examination today with benign nasopharynx, nontender sinuses, left TM and canal benign, right TM obscured by dark impacted cerumen (which we were unable to irrigate due to significant dizziness), oropharynx markedly injected without visualized exudate, thrush, lung fields are clear to auscultation bilaterally throughout all fields without wheezes, rales, rhonchi.  Rapid strep was obtained and noted to be positive, given her sporadic recent  dosing of Augmentin alternate regimen advised.  She will be placed on azithromycin, strongly recommended patient to complete full course even if symptoms improve sooner, adjunct mucolytic OTC, and Rx Phenergan-DM as needed.  Supportive care advised, she can follow-up as needed.  Orders: -     Azithromycin; Take 2 tablets on day 1, then 1 tablet daily on days 2 through 5  Dispense: 6 tablet; Refill: 0  Other orders -     Promethazine-DM; Take 5 mLs by mouth 4 (four) times daily as needed.  Dispense: 118 mL; Refill: 0     Orders & Medications Meds ordered this encounter  Medications   azithromycin (ZITHROMAX) 250 MG tablet    Sig: Take 2 tablets on day 1, then 1 tablet daily on days 2 through 5    Dispense:  6 tablet    Refill:  0   promethazine-dextromethorphan (PROMETHAZINE-DM) 6.25-15 MG/5ML syrup    Sig: Take 5 mLs by mouth 4 (four) times daily as needed.    Dispense:  118 mL    Refill:  0   No orders of the defined types were placed in this encounter.    No follow-ups on file.     Montel Culver, MD, [facility]   Primary Care Sports Medicine Primary Care and Sports Medicine at [facility]

## 2022-04-20 NOTE — Patient Instructions (Signed)
-  Take antibiotics for full course - Use over-the-counter mucus medicine scheduled while on antibiotics - Can use cough medicine as needed - Drink plenty of fluids and advise any close contacts to seek medical attention if they have similar symptoms - Follow-up as needed

## 2022-04-20 NOTE — Assessment & Plan Note (Signed)
Patient presents for dysphagia ongoing since the December timeframe, focal right side of the throat involving cough, thick drainage and secretions, right ear pressure.  She denies any fevers, chills, no shortness of air, no chest pain.  Of note, she did Present to [facility] urgent care on 03/12/2022, 04/01/2022, and with PCP Dr. Army Melia on 04/05/2022.  After her visit with Dr. Army Melia she did endorse improvement with Diflucan with concern over oral thrush, however she self discontinued Augmentin prior to completion of course from initial Rx for 10 days starting 03/12/2022.  (EMR records reviewed x 3)  Examination today with benign nasopharynx, nontender sinuses, left TM and canal benign, right TM obscured by dark impacted cerumen (which we were unable to irrigate due to significant dizziness), oropharynx markedly injected without visualized exudate, thrush, lung fields are clear to auscultation bilaterally throughout all fields without wheezes, rales, rhonchi.  Rapid strep was obtained and noted to be positive, given her sporadic recent dosing of Augmentin alternate regimen advised.  She will be placed on azithromycin, strongly recommended patient to complete full course even if symptoms improve sooner, adjunct mucolytic OTC, and Rx Phenergan-DM as needed.  Supportive care advised, she can follow-up as needed.

## 2022-05-02 DIAGNOSIS — H18413 Arcus senilis, bilateral: Secondary | ICD-10-CM | POA: Diagnosis not present

## 2022-05-02 DIAGNOSIS — H40013 Open angle with borderline findings, low risk, bilateral: Secondary | ICD-10-CM | POA: Diagnosis not present

## 2022-05-02 DIAGNOSIS — H43823 Vitreomacular adhesion, bilateral: Secondary | ICD-10-CM | POA: Diagnosis not present

## 2022-05-02 DIAGNOSIS — H2512 Age-related nuclear cataract, left eye: Secondary | ICD-10-CM | POA: Diagnosis not present

## 2022-05-04 ENCOUNTER — Ambulatory Visit: Payer: Self-pay | Admitting: *Deleted

## 2022-05-04 ENCOUNTER — Ambulatory Visit (INDEPENDENT_AMBULATORY_CARE_PROVIDER_SITE_OTHER): Payer: HMO | Admitting: Family Medicine

## 2022-05-04 ENCOUNTER — Encounter: Payer: Self-pay | Admitting: Family Medicine

## 2022-05-04 VITALS — BP 134/72 | HR 64 | Ht 62.0 in | Wt 156.0 lb

## 2022-05-04 DIAGNOSIS — I10 Essential (primary) hypertension: Secondary | ICD-10-CM | POA: Diagnosis not present

## 2022-05-04 NOTE — Patient Instructions (Addendum)
Managing Your Hypertension Hypertension, also called high blood pressure, is when the force of the blood pressing against the walls of the arteries is too strong. Arteries are blood vessels that carry blood from your heart throughout your body. Hypertension forces the heart to work harder to pump blood and may cause the arteries to become narrow or stiff. Understanding blood pressure readings A blood pressure reading includes a higher number over a lower number: The first, or top, number is called the systolic pressure. It is a measure of the pressure in your arteries as your heart beats. The second, or bottom number, is called the diastolic pressure. It is a measure of the pressure in your arteries as the heart relaxes. For most people, a normal blood pressure is below 120/80. Your personal target blood pressure may vary depending on your medical conditions, your age, and other factors. Blood pressure is classified into four stages. Based on your blood pressure reading, your health care provider may use the following stages to determine what type of treatment you need, if any. Systolic pressure and diastolic pressure are measured in a unit called millimeters of mercury (mmHg). Normal Systolic pressure: below 120. Diastolic pressure: below 80. Elevated Systolic pressure: 120-129. Diastolic pressure: below 80. Hypertension stage 1 Systolic pressure: 130-139. Diastolic pressure: 80-89. Hypertension stage 2 Systolic pressure: 140 or above. Diastolic pressure: 90 or above. How can this condition affect me? Managing your hypertension is very important. Over time, hypertension can damage the arteries and decrease blood flow to parts of the body, including the brain, heart, and kidneys. Having untreated or uncontrolled hypertension can lead to: A heart attack. A stroke. A weakened blood vessel (aneurysm). Heart failure. Kidney damage. Eye damage. Memory and concentration problems. Vascular  dementia. What actions can I take to manage this condition? Hypertension can be managed by making lifestyle changes and possibly by taking medicines. Your health care provider will help you make a plan to bring your blood pressure within a normal range. You may be referred for counseling on a healthy diet and physical activity. Nutrition  Eat a diet that is high in fiber and potassium, and low in salt (sodium), added sugar, and fat. An example eating plan is called the DASH diet. DASH stands for Dietary Approaches to Stop Hypertension. To eat this way: Eat plenty of fresh fruits and vegetables. Try to fill one-half of your plate at each meal with fruits and vegetables. Eat whole grains, such as whole-wheat pasta, brown rice, or whole-grain bread. Fill about one-fourth of your plate with whole grains. Eat low-fat dairy products. Avoid fatty cuts of meat, processed or cured meats, and poultry with skin. Fill about one-fourth of your plate with lean proteins such as fish, chicken without skin, beans, eggs, and tofu. Avoid pre-made and processed foods. These tend to be higher in sodium, added sugar, and fat. Reduce your daily sodium intake. Many people with hypertension should eat less than 1,500 mg of sodium a day. Lifestyle  Work with your health care provider to maintain a healthy body weight or to lose weight. Ask what an ideal weight is for you. Get at least 30 minutes of exercise that causes your heart to beat faster (aerobic exercise) most days of the week. Activities may include walking, swimming, or biking. Include exercise to strengthen your muscles (resistance exercise), such as weight lifting, as part of your weekly exercise routine. Try to do these types of exercises for 30 minutes at least 3 days a week. Do   not use any products that contain nicotine or tobacco. These products include cigarettes, chewing tobacco, and vaping devices, such as e-cigarettes. If you need help quitting, ask your  health care provider. Control any long-term (chronic) conditions you have, such as high cholesterol or diabetes. Identify your sources of stress and find ways to manage stress. This may include meditation, deep breathing, or making time for fun activities. Alcohol use Do not drink alcohol if: Your health care provider tells you not to drink. You are pregnant, may be pregnant, or are planning to become pregnant. If you drink alcohol: Limit how much you have to: 0-1 drink a day for women. 0-2 drinks a day for men. Know how much alcohol is in your drink. In the [place] one drink equals one 12 oz bottle of beer (355 mL), one 5 oz glass of wine (148 mL), or one 1 oz glass of hard liquor (44 mL). Medicines Your health care provider may prescribe medicine if lifestyle changes are not enough to get your blood pressure under control and if: Your systolic blood pressure is 130 or higher. Your diastolic blood pressure is 80 or higher. Take medicines only as told by your health care provider. Follow the directions carefully. Blood pressure medicines must be taken as told by your health care provider. The medicine does not work as well when you skip doses. Skipping doses also puts you at risk for problems. Monitoring Before you monitor your blood pressure: Do not smoke, drink caffeinated beverages, or exercise within 30 minutes before taking a measurement. Use the bathroom and empty your bladder (urinate). Sit quietly for at least 5 minutes before taking measurements. Monitor your blood pressure at home as told by your health care provider. To do this: Sit with your back straight and supported. Place your feet flat on the floor. Do not cross your legs. Support your arm on a flat surface, such as a table. Make sure your upper arm is at heart level. Each time you measure, take two or three readings one minute apart and record the results. You may also need to have your blood pressure checked regularly by  your health care provider. General information Talk with your health care provider about your diet, exercise habits, and other lifestyle factors that may be contributing to hypertension. Review all the medicines you take with your health care provider because there may be side effects or interactions. Keep all follow-up visits. Your health care provider can help you create and adjust your plan for managing your high blood pressure. Where to find more information National Heart, Lung, and Blood Institute: www.nhlbi.nih.gov American Heart Association: www.heart.org Contact a health care provider if: You think you are having a reaction to medicines you have taken. You have repeated (recurrent) headaches. You feel dizzy. You have swelling in your ankles. You have trouble with your vision. Get help right away if: You develop a severe headache or confusion. You have unusual weakness or numbness, or you feel faint. You have severe pain in your chest or abdomen. You vomit repeatedly. You have trouble breathing. These symptoms may be an emergency. Get help right away. Call 911. Do not wait to see if the symptoms will go away. Do not drive yourself to the hospital. Summary Hypertension is when the force of blood pumping through your arteries is too strong. If this condition is not controlled, it may put you at risk for serious complications. Your personal target blood pressure may vary depending on your medical conditions,   your age, and other factors. For most people, a normal blood pressure is less than 120/80. Hypertension is managed by lifestyle changes, medicines, or both. Lifestyle changes to help manage hypertension include losing weight, eating a healthy, low-sodium diet, exercising more, stopping smoking, and limiting alcohol. This information is not intended to replace advice given to you by your health care provider. Make sure you discuss any questions you have with your health care  provider. Document Revised: 12/02/2020 Document Reviewed: 12/02/2020 Elsevier Patient Education  [place]. How to Take Your Blood Pressure Blood pressure is a measurement of how strongly your blood is pressing against the walls of your arteries. Arteries are blood vessels that carry blood from your heart throughout your body. Your health care provider takes your blood pressure at each office visit. You can also take your own blood pressure at home with a blood pressure monitor. You may need to take your own blood pressure to: Confirm a diagnosis of high blood pressure (hypertension). Monitor your blood pressure over time. Make sure your blood pressure medicine is working. Supplies needed: Blood pressure monitor. A chair to sit in. This should be a chair where you can sit upright with your back supported. Do not sit on a soft couch or an armchair. Table or desk. Small notebook and pencil or pen. How to prepare To get the most accurate reading, avoid the following for 30 minutes before you check your blood pressure: Drinking caffeine. Drinking alcohol. Eating. Smoking. Exercising. Five minutes before you check your blood pressure: Use the bathroom and urinate so that you have an empty bladder. Sit quietly in a chair. Do not talk. How to take your blood pressure To check your blood pressure, follow the instructions in the manual that came with your blood pressure monitor. If you have a digital blood pressure monitor, the instructions may be as follows: Sit up straight in a chair. Place your feet on the floor. Do not cross your ankles or legs. Rest your left arm at the level of your heart on a table or desk or on the arm of a chair. Pull up your shirt sleeve. Wrap the blood pressure cuff around the upper part of your left arm, 1 inch (2.5 cm) above your elbow. It is best to wrap the cuff around bare skin. Fit the cuff snugly, but not too tightly, around your arm. You should be  able to place only one finger between the cuff and your arm. Position the cord so that it rests in the bend of your elbow. Press the power button. Sit quietly while the cuff inflates and deflates. Read the digital reading on the monitor screen and write the numbers down (record them) in a notebook. Wait 2-3 minutes, then repeat the steps, starting at step 1. What does my blood pressure reading mean? A blood pressure reading consists of a higher number over a lower number. Ideally, your blood pressure should be below 120/80. The first ("top") number is called the systolic pressure. It is a measure of the pressure in your arteries as your heart beats. The second ("bottom") number is called the diastolic pressure. It is a measure of the pressure in your arteries as the heart relaxes. Blood pressure is classified into four stages. The following are the stages for adults who do not have a short-term serious illness or a chronic condition. Systolic pressure and diastolic pressure are measured in a unit called mm Hg (millimeters of mercury).  Normal Systolic pressure: below  269. Diastolic pressure: below 80. Elevated Systolic pressure: 485-462. Diastolic pressure: below 80. Hypertension stage 1 Systolic pressure: 703-500. Diastolic pressure: 93-81. Hypertension stage 2 Systolic pressure: 829 or above. Diastolic pressure: 90 or above. You can have elevated blood pressure or hypertension even if only the systolic or only the diastolic number in your reading is higher than normal. Follow these instructions at home: Medicines Take over-the-counter and prescription medicines only as told by your health care provider. Tell your health care provider if you are having any side effects from blood pressure medicine. General instructions Check your blood pressure as often as recommended by your health care provider. Check your blood pressure at the same time every day. Take your monitor to the next  appointment with your health care provider to make sure that: You are using it correctly. It provides accurate readings. Understand what your goal blood pressure numbers are. Keep all follow-up visits. This is important. General tips Your health care provider can suggest a reliable monitor that will meet your needs. There are several types of home blood pressure monitors. Choose a monitor that has an arm cuff. Do not choose a monitor that measures your blood pressure from your wrist or finger. Choose a cuff that wraps snugly, not too tight or too loose, around your upper arm. You should be able to fit only one finger between your arm and the cuff. You can buy a blood pressure monitor at most drugstores or online. Where to find more information American Heart Association: www.heart.org Contact a health care provider if: Your blood pressure is consistently high. Your blood pressure is suddenly low. Get help right away if: Your systolic blood pressure is higher than 180. Your diastolic blood pressure is higher than 120. These symptoms may be an emergency. Get help right away. Call 911. Do not wait to see if the symptoms will go away. Do not drive yourself to the hospital. Summary Blood pressure is a measurement of how strongly your blood is pressing against the walls of your arteries. A blood pressure reading consists of a higher number over a lower number. Ideally, your blood pressure should be below 120/80. Check your blood pressure at the same time every day. Avoid caffeine, alcohol, smoking, and exercise for 30 minutes prior to checking your blood pressure. These agents can affect the accuracy of the blood pressure reading. This information is not intended to replace advice given to you by your health care provider. Make sure you discuss any questions you have with your health care provider. Document Revised: 12/02/2020 Document Reviewed: 12/02/2020 Elsevier Patient Education  [place] Eating Plan DASH stands for Dietary Approaches to Stop Hypertension. The DASH eating plan is a healthy eating plan that has been shown to: Reduce high blood pressure (hypertension). Reduce your risk for type 2 diabetes, heart disease, and stroke. Help with weight loss. What are tips for following this plan? Reading food labels Check food labels for the amount of salt (sodium) per serving. Choose foods with less than 5 percent of the Daily Value of sodium. Generally, foods with less than 300 milligrams (mg) of sodium per serving fit into this eating plan. To find whole grains, look for the word "whole" as the first word in the ingredient list. Shopping Buy products labeled as "low-sodium" or "no salt added." Buy fresh foods. Avoid canned foods and pre-made or frozen meals. Cooking Avoid adding salt when cooking. Use salt-free seasonings or herbs instead of table salt or sea  salt. Check with your health care provider or pharmacist before using salt substitutes. Do not fry foods. Cook foods using healthy methods such as baking, boiling, grilling, roasting, and broiling instead. Cook with heart-healthy oils, such as olive, canola, avocado, soybean, or sunflower oil. Meal planning  Eat a balanced diet that includes: 4 or more servings of fruits and 4 or more servings of vegetables each day. Try to fill one-half of your plate with fruits and vegetables. 6-8 servings of whole grains each day. Less than 6 oz (170 g) of lean meat, poultry, or fish each day. A 3-oz (85-g) serving of meat is about the same size as a deck of cards. One egg equals 1 oz (28 g). 2-3 servings of low-fat dairy each day. One serving is 1 cup (237 mL). 1 serving of nuts, seeds, or beans 5 times each week. 2-3 servings of heart-healthy fats. Healthy fats called omega-3 fatty acids are found in foods such as walnuts, flaxseeds, fortified milks, and eggs. These fats are also found in cold-water fish, such as  sardines, salmon, and mackerel. Limit how much you eat of: Canned or prepackaged foods. Food that is high in trans fat, such as some fried foods. Food that is high in saturated fat, such as fatty meat. Desserts and other sweets, sugary drinks, and other foods with added sugar. Full-fat dairy products. Do not salt foods before eating. Do not eat more than 4 egg yolks a week. Try to eat at least 2 vegetarian meals a week. Eat more home-cooked food and less restaurant, buffet, and fast food. Lifestyle When eating at a restaurant, ask that your food be prepared with less salt or no salt, if possible. If you drink alcohol: Limit how much you use to: 0-1 drink a day for women who are not pregnant. 0-2 drinks a day for men. Be aware of how much alcohol is in your drink. In the [place] one drink equals one 12 oz bottle of beer (355 mL), one 5 oz glass of wine (148 mL), or one 1 oz glass of hard liquor (44 mL). General information Avoid eating more than 2,300 mg of salt a day. If you have hypertension, you may need to reduce your sodium intake to 1,500 mg a day. Work with your health care provider to maintain a healthy body weight or to lose weight. Ask what an ideal weight is for you. Get at least 30 minutes of exercise that causes your heart to beat faster (aerobic exercise) most days of the week. Activities may include walking, swimming, or biking. Work with your health care provider or dietitian to adjust your eating plan to your individual calorie needs. What foods should I eat? Fruits All fresh, dried, or frozen fruit. Canned fruit in natural juice (without added sugar). Vegetables Fresh or frozen vegetables (raw, steamed, roasted, or grilled). Low-sodium or reduced-sodium tomato and vegetable juice. Low-sodium or reduced-sodium tomato sauce and tomato paste. Low-sodium or reduced-sodium canned vegetables. Grains Whole-grain or whole-wheat bread. Whole-grain or whole-wheat pasta. Brown rice.  [place]. Bulgur. Whole-grain and low-sodium cereals. Pita bread. Low-fat, low-sodium crackers. Whole-wheat flour tortillas. Meats and other proteins Skinless chicken or [place]. Ground chicken or [place]. Pork with fat trimmed off. Fish and seafood. Egg whites. Dried beans, peas, or lentils. Unsalted nuts, nut butters, and seeds. Unsalted canned beans. Lean cuts of beef with fat trimmed off. Low-sodium, lean precooked or cured meat, such as sausages or meat loaves. Dairy Low-fat (1%) or fat-free (skim) milk. Reduced-fat, low-fat,  or fat-free cheeses. Nonfat, low-sodium ricotta or cottage cheese. Low-fat or nonfat yogurt. Low-fat, low-sodium cheese. Fats and oils Soft margarine without trans fats. Vegetable oil. Reduced-fat, low-fat, or light mayonnaise and salad dressings (reduced-sodium). Canola, safflower, olive, avocado, soybean, and sunflower oils. Avocado. Seasonings and condiments Herbs. Spices. Seasoning mixes without salt. Other foods Unsalted popcorn and pretzels. Fat-free sweets. The items listed above may not be a complete list of foods and beverages you can eat. Contact a dietitian for more information. What foods should I avoid? Fruits Canned fruit in a light or heavy syrup. Fried fruit. Fruit in cream or butter sauce. Vegetables Creamed or fried vegetables. Vegetables in a cheese sauce. Regular canned vegetables (not low-sodium or reduced-sodium). Regular canned tomato sauce and paste (not low-sodium or reduced-sodium). Regular tomato and vegetable juice (not low-sodium or reduced-sodium). Angie Fava. Olives. Grains Baked goods made with fat, such as croissants, muffins, or some breads. Dry pasta or rice meal packs. Meats and other proteins Fatty cuts of meat. Ribs. Fried meat. Berniece Salines. Bologna, salami, and other precooked or cured meats, such as sausages or meat loaves. Fat from the back of a pig (fatback). Bratwurst. Salted nuts and seeds. Canned beans with added salt. Canned or  smoked fish. Whole eggs or egg yolks. Chicken or [place] with skin. Dairy Whole or 2% milk, cream, and half-and-half. Whole or full-fat cream cheese. Whole-fat or sweetened yogurt. Full-fat cheese. Nondairy creamers. Whipped toppings. Processed cheese and cheese spreads. Fats and oils Butter. Stick margarine. Lard. Shortening. Ghee. Bacon fat. Tropical oils, such as coconut, palm kernel, or palm oil. Seasonings and condiments Onion salt, garlic salt, seasoned salt, table salt, and sea salt. Worcestershire sauce. Tartar sauce. Barbecue sauce. Teriyaki sauce. Soy sauce, including reduced-sodium. Steak sauce. Canned and packaged gravies. Fish sauce. Oyster sauce. Cocktail sauce. Store-bought horseradish. Ketchup. Mustard. Meat flavorings and tenderizers. Bouillon cubes. Hot sauces. Pre-made or packaged marinades. Pre-made or packaged taco seasonings. Relishes. Regular salad dressings. Other foods Salted popcorn and pretzels. The items listed above may not be a complete list of foods and beverages you should avoid. Contact a dietitian for more information. Where to find more information National Heart, Lung, and Blood Institute: https://wilson-eaton.com/ American Heart Association: www.heart.org Academy of Nutrition and Dietetics: www.eatright.[place]: www.kidney.org Summary The DASH eating plan is a healthy eating plan that has been shown to reduce high blood pressure (hypertension). It may also reduce your risk for type 2 diabetes, heart disease, and stroke. When on the DASH eating plan, aim to eat more fresh fruits and vegetables, whole grains, lean proteins, low-fat dairy, and heart-healthy fats. With the DASH eating plan, you should limit salt (sodium) intake to 2,300 mg a day. If you have hypertension, you may need to reduce your sodium intake to 1,500 mg a day. Work with your health care provider or dietitian to adjust your eating plan to your individual calorie needs. This  information is not intended to replace advice given to you by your health care provider. Make sure you discuss any questions you have with your health care provider. Document Revised: 02/21/2019 Document Reviewed: 02/21/2019 Elsevier Patient Education  [place].

## 2022-05-04 NOTE — Telephone Encounter (Signed)
   Chief Complaint: BP ?160/80 with occasional dizziness Symptoms: Dizzy Frequency: 2 Weeks Pertinent Negatives: Patient denies  Disposition: '[]'$ ED /'[]'$ Urgent Care (no appt availability in office) / '[x]'$ Appointment(In office/virtual)/ '[]'$  [facility]/ '[]'$ Home Care/ '[]'$ Refused Recommended Disposition /'[]'$ [facility]/ '[]'$  Follow-up with PCP Additional Notes: Will go to ED for worsening of symptoms.  Reason for Disposition  Systolic BP  >= 122 OR Diastolic >= 449  Answer Assessment - Initial Assessment Questions 1. BLOOD PRESSURE: "What is the blood pressure?" "Did you take at least two measurements 5 minutes apart?"     Today 2. ONSET: "When did you take your blood pressure?"     1 week ago 3. HOW: "How did you take your blood pressure?" (e.g., automatic home BP monitor, visiting nurse)     Home cuff 4. HISTORY: "Do you have a history of high blood pressure?"     Yes 5. MEDICINES: "Are you taking any medicines for blood pressure?" "Have you missed any doses recently?"     Yes 6. OTHER SYMPTOMS: "Do you have any symptoms?" (e.g., blurred vision, chest pain, difficulty breathing, headache, weakness)     Dizziness 7. PREGNANCY: "Is there any chance you are pregnant?" "When was your last menstrual period?"     No  Protocols used: Blood Pressure - High-A-AH

## 2022-05-04 NOTE — Telephone Encounter (Signed)
Summary: elevated blood pressure that sometimes causes pt to feel dizzy.   Pt requests that a nurse return her call to discuss elevated blood pressure that sometimes causes her to feel dizzy. Attempted to schedule an appt but pt declined due to the next appt being 05/23/22. Pt not feeling dizzy at time of call but requests that a nurse return her call to discuss. Cb# [phone]         Called [phone] to review elevated BP and dizziness with patient. No answer, recording call can not be completed as dialed, hang up and try call again later. Unable to leave VM.

## 2022-05-04 NOTE — Progress Notes (Signed)
Date:  05/04/2022   Name:  Melinda Ford   DOB:  [birth year]   MRN:  [id number]   Chief Complaint: Hypertension (88-90 on bottom)  Patient is a 77 year old female who presents for a comprehensive physical exam. The patient reports the following problems: elevated blood pressure readings at home. Health maintenance has been reviewed up-to-date    Hypertension This is a new problem. The current episode started in the past 7 days. The problem has been gradually improving since onset. The problem is controlled. Associated symptoms include headaches. Pertinent negatives include no anxiety, blurred vision, chest pain, palpitations, peripheral edema or shortness of breath. There are no associated agents to hypertension. Past treatments include angiotensin blockers and diuretics. The current treatment provides moderate improvement. There are no compliance problems.  There is no history of angina, kidney disease, CAD/MI, CVA, heart failure, left ventricular hypertrophy, PVD or retinopathy. There is no history of chronic renal disease, a hypertension causing med or renovascular disease.    Lab Results  Component Value Date   NA 140 04/12/2022   K 4.1 04/12/2022   CO2 28 04/12/2022   GLUCOSE 105 (H) 04/12/2022   BUN 19 04/12/2022   CREATININE 0.70 04/12/2022   CALCIUM 9.8 04/12/2022   EGFR 82 07/01/2021   GFRNONAA 98 09/10/2015   Lab Results  Component Value Date   CHOL 199 04/12/2022   HDL 84.50 04/12/2022   LDLCALC 97 04/12/2022   TRIG 90.0 04/12/2022   CHOLHDL 2 04/12/2022   Lab Results  Component Value Date   TSH 1.650 12/28/2020   Lab Results  Component Value Date   HGBA1C 8.0 (A) 04/12/2022   Lab Results  Component Value Date   WBC 5.2 07/01/2021   HGB 14.5 07/01/2021   HCT 44.4 07/01/2021   MCV 93 07/01/2021   PLT 284 07/01/2021   Lab Results  Component Value Date   ALT 63 (H) 04/12/2022   AST 49 (H) 04/12/2022   ALKPHOS 87 04/12/2022   BILITOT 0.4 04/12/2022    Lab Results  Component Value Date   VD25OH 34.0 12/28/2020     Review of Systems  Constitutional:  Negative for activity change, appetite change, chills, diaphoresis, fatigue, fever and unexpected weight change.  HENT:  Negative for congestion and trouble swallowing.   Eyes:  Negative for blurred vision.  Respiratory:  Negative for cough, shortness of breath and stridor.   Cardiovascular:  Negative for chest pain and palpitations.  Gastrointestinal:  Negative for abdominal pain and constipation.  Neurological:  Positive for headaches.    Patient Active Problem List   Diagnosis Date Noted   Strep pharyngitis 04/20/2022   Cervical radicular pain (left C5,6) 03/17/2021   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/2021   Chronic pain syndrome 03/17/2021   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/2021   Cervical paraspinal muscle spasm 02/10/2021   Gastroesophageal reflux disease 12/28/2020   Personal history of colonic polyps    Polyp of sigmoid colon    Abdominal pain, epigastric    Patient noncompliant with statin medication 09/12/2019   Type II diabetes mellitus with complication ([place]) 33/00/7622   Hyperlipidemia associated with type 2 diabetes mellitus ([place]) 05/17/2018   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 02/22/2018   Chronic midline thoracic back pain 02/22/2018   Tinea versicolor 09/20/2016   OSA (obstructive sleep apnea) 08/23/2016   Allergic rhinitis 07/13/2016   Overweight (BMI 25.0-29.9) 10/01/2015   Vitamin D deficiency 09/18/2014  Onychomycosis 09/16/2014   Degenerative disc disease, lumbar 09/16/2014   Benign essential HTN 10/10/2010   Osteoporosis 10/10/2010    Allergies  Allergen Reactions   Ace Inhibitors Cough   Wheat Bran Hives   Gramineae Pollens     Past Surgical History:  Procedure Laterality Date   ABDOMINAL HYSTERECTOMY     CATARACT EXTRACTION Right 2020   COLONOSCOPY WITH PROPOFOL N/A 10/10/2019   Procedure:  COLONOSCOPY WITH PROPOFOL;  Surgeon: Lucilla Lame, MD;  Location: [place];  Service: Endoscopy;  Laterality: N/A;   ESOPHAGOGASTRODUODENOSCOPY (EGD) WITH PROPOFOL N/A 10/10/2019   Procedure: ESOPHAGOGASTRODUODENOSCOPY (EGD) WITH PROPOFOL;  Surgeon: Lucilla Lame, MD;  Location: [place];  Service: Endoscopy;  Laterality: N/A;  Diabetic - insulin pump and oral meds sleep apnea   POLYPECTOMY  10/10/2019   Procedure: POLYPECTOMY;  Surgeon: Lucilla Lame, MD;  Location: [place];  Service: Endoscopy;;    Social History   Tobacco Use   Smoking status: Never   Smokeless tobacco: Never  Vaping Use   Vaping Use: Never used  Substance Use Topics   Alcohol use: Not Currently    Alcohol/week: 1.0 standard drink of alcohol    Types: 1 Standard drinks or equivalent per week   Drug use: Never     Medication list has been reviewed and updated.  Current Meds  Medication Sig   acetaminophen (TYLENOL) 500 MG tablet Take 1,000 mg by mouth 2 (two) times daily. As needed   amLODipine (NORVASC) 10 MG tablet Take 1 tablet by mouth once daily   Calcium Carbonate-Vit D-Min (CALCIUM 1200 PO) Take 2 each by mouth daily.   Cholecalciferol (VITAMIN D) 2000 UNITS CAPS Take 1 capsule (2,000 Units total) by mouth daily.   Continuous Blood Gluc Sensor (FREESTYLE LIBRE 2 SENSOR) MISC USE AS DIRECTED   gabapentin (NEURONTIN) 100 MG capsule Take 1-3 capsules (100-300 mg total) by mouth at bedtime. Follow written titration schedule.   GINKGO BILOBA COMPLEX PO Take 1 capsule by mouth daily.   glucose blood (ONETOUCH VERIO) test strip USE 1 STRIP TO CHECK GLUCOSE 4 TIMES DAILY   insulin aspart (NOVOLOG) 100 UNIT/ML injection USE A MAX OF 90 UNITS DAILY VIA INSULIN PUMP   Insulin Disposable Pump (V-GO 20) 20 UNIT/24HR KIT 1  ONCE DAILY   insulin lispro (HUMALOG) 100 UNIT/ML injection USE A MAX OF 90 UNITS DAILY VIA INSULIN PUMP   Insulin Pen Needle (PEN NEEDLES) 32G X 5 MM MISC Please    losartan (COZAAR) 100 MG tablet Take 1 tablet by mouth once daily   NON FORMULARY CPAP @@ 9 cm H2O nightly   omeprazole (PRILOSEC) 20 MG capsule Take 1 capsule (20 mg total) by mouth 2 (two) times daily before a meal. (Patient taking differently: Take 20 mg by mouth daily.)   pravastatin (PRAVACHOL) 20 MG tablet Take 1 tablet by mouth once daily (Patient taking differently: Take 20 mg by mouth 3 (three) times a week.)   promethazine-dextromethorphan (PROMETHAZINE-DM) 6.25-15 MG/5ML syrup Take 5 mLs by mouth 4 (four) times daily as needed.   Semaglutide, 1 MG/DOSE, (OZEMPIC, 1 MG/DOSE,) 4 MG/3ML SOPN Inject 1 mg weekly   triamcinolone cream (KENALOG) 0.5 % Apply 1 Application topically 3 (three) times daily. To rash on neck   [DISCONTINUED] ipratropium (ATROVENT) 0.06 % nasal spray Place 2 sprays into both nostrils 4 (four) times daily.       05/04/2022    4:44 PM 04/05/2022    1:49 PM 03/13/2022  11:41 AM 12/26/2021    9:00 AM  GAD 7 : Generalized Anxiety Score  Nervous, Anxious, on Edge 0 0 1 0  Control/stop worrying 0 0 1 0  Worry too much - different things 0 1 0 1  Trouble relaxing 0 0 0 0  Restless 0 0 0 0  Easily annoyed or irritable 0 0 0 0  Afraid - awful might happen 0 0 0 0  Total GAD 7 Score 0 '1 2 1  '$ Anxiety Difficulty Not difficult at all Not difficult at all Not difficult at all Not difficult at all       05/04/2022    4:44 PM 04/05/2022    1:49 PM 03/13/2022   11:41 AM  Depression screen PHQ 2/9  Decreased Interest 0 0 0  Down, Depressed, Hopeless 0 0 0  PHQ - 2 Score 0 0 0  Altered sleeping 0 0 0  Tired, decreased energy 0 1 0  Change in appetite 0 0 0  Feeling bad or failure about yourself  0 0 0  Trouble concentrating 0 0 0  Moving slowly or fidgety/restless 0 0 1  Suicidal thoughts 0 0 0  PHQ-9 Score 0 1 1  Difficult doing work/chores Not difficult at all Not difficult at all Not difficult at all    BP Readings from Last 3 Encounters:  04/20/22 128/80   04/12/22 138/82  04/05/22 128/76    Physical Exam Vitals and nursing note reviewed. Exam conducted with a chaperone present.  Constitutional:      General: She is not in acute distress.    Appearance: She is not diaphoretic.  HENT:     Head: Normocephalic and atraumatic.     Right Ear: Tympanic membrane and external ear normal.     Left Ear: Tympanic membrane and external ear normal.     Nose: Nose normal.  Eyes:     General:        Right eye: No discharge.        Left eye: No discharge.     Conjunctiva/sclera: Conjunctivae normal.     Pupils: Pupils are equal, round, and reactive to light.  Neck:     Thyroid: No thyromegaly.     Vascular: No JVD.  Cardiovascular:     Rate and Rhythm: Normal rate and regular rhythm.     Heart sounds: Normal heart sounds. No murmur heard.    No friction rub. No gallop.  Pulmonary:     Effort: Pulmonary effort is normal.     Breath sounds: Normal breath sounds. No wheezing, rhonchi or rales.  Chest:     Chest wall: No tenderness.  Abdominal:     General: Bowel sounds are normal.     Palpations: Abdomen is soft. There is no mass.     Tenderness: There is no abdominal tenderness. There is no guarding.  Musculoskeletal:        General: Normal range of motion.     Cervical back: Normal range of motion and neck supple.  Lymphadenopathy:     Cervical: No cervical adenopathy.  Skin:    General: Skin is warm and dry.  Neurological:     Mental Status: She is alert.     Deep Tendon Reflexes: Reflexes are normal and symmetric.     Wt Readings from Last 3 Encounters:  05/04/22 156 lb (70.8 kg)  04/20/22 156 lb (70.8 kg)  04/12/22 158 lb (71.7 kg)    Ht '5\' 2"'$  (1.575 m)  Wt 156 lb (70.8 kg)   BMI 28.53 kg/m   Assessment and Plan:  1. Benign essential HTN Chronic.  Controlled.  Stable.  Blood pressure reading today is 134/72 and corresponds with the newer up to blood pressure monitoring machines that patient brought in today.  I have  gone over the new guidelines and how to take blood pressure with the feet flat on the floor and resting for 5 minutes while feet are flat on the floor.  Patient's been given a Dash diet and encouraged to continue her present dosing of amlodipine and losartan.  Patient has been reassured and acceptable with current readings and where they are currently are registering.   Otilio Miu, MD

## 2022-05-04 NOTE — Telephone Encounter (Signed)
2nd attempt to contact patient using [phone] to review sx elevated BP and dizziness. No answer, VM not set up. Unable to leave message.

## 2022-05-09 ENCOUNTER — Ambulatory Visit: Payer: PPO | Attending: Student in an Organized Health Care Education/Training Program | Admitting: Occupational Therapy

## 2022-05-09 DIAGNOSIS — M6281 Muscle weakness (generalized): Secondary | ICD-10-CM | POA: Insufficient documentation

## 2022-05-09 NOTE — Therapy (Signed)
[facility] [address], [place], [address] Phone: [phone]   Fax:  [phone]  Occupational Therapy Treatment  Patient Details  Name: Melinda Ford MRN: [id number] Date of Birth: [birth year] Referring Provider (OT): DR Holley Raring   Encounter Date: 05/09/2022   OT End of Session - 05/09/22 1632     Visit Number 7    Number of Visits 8    Date for OT Re-Evaluation 06/01/22    OT Start Time 1033    OT Stop Time 1108    OT Time Calculation (min) 35 min    Activity Tolerance Patient tolerated treatment well    Behavior During Therapy [facility] for tasks assessed/performed             Past Medical History:  Diagnosis Date   Allergy    Arthritis    Cataract    Diabetes mellitus without complication ([place])    Hyperlipidemia    Hypertension    Motion sickness    planes   OSA (obstructive sleep apnea) 08/23/2016   CPAP   Osteoporosis    Presence of dental prosthetic device    implants   Radiculopathy of lumbosacral region 06/22/2016   Right lateral epicondylitis 06/04/2017   Right sided sciatica 03/09/2016   Sleep apnea     Past Surgical History:  Procedure Laterality Date   ABDOMINAL HYSTERECTOMY     CATARACT EXTRACTION Right 2020   COLONOSCOPY WITH PROPOFOL N/A 10/10/2019   Procedure: COLONOSCOPY WITH PROPOFOL;  Surgeon: Lucilla Lame, MD;  Location: [place];  Service: Endoscopy;  Laterality: N/A;   ESOPHAGOGASTRODUODENOSCOPY (EGD) WITH PROPOFOL N/A 10/10/2019   Procedure: ESOPHAGOGASTRODUODENOSCOPY (EGD) WITH PROPOFOL;  Surgeon: Lucilla Lame, MD;  Location: [place];  Service: Endoscopy;  Laterality: N/A;  Diabetic - insulin pump and oral meds sleep apnea   POLYPECTOMY  10/10/2019   Procedure: POLYPECTOMY;  Surgeon: Lucilla Lame, MD;  Location: [place];  Service: Endoscopy;;    There were no vitals filed for this visit.   Subjective Assessment - 05/09/22 1632     Subjective   High blood pressure has been bothering me.  My pinky is still about the same I think.  I seen you about a month ago.    Pertinent History She is having difficulty with left hand lifting, typing, jewelry and gripping- pinkie not work well.  Of note, she has cervical spondylosis with impingement at C4-C5, C3-C4.  She also has an accentuated central cord signal at C4-C5 that could be contributing to her radicular symptoms.  She is currently on gabapentin.  At Dr Elwyn Lade visit she was  interested in learning more about a cervical epidural steroid injection.  We discussed this at length including the risks and benefits associated with injection.  Patient states that she will think about this further and let us know if she would like to proceed.    Pt just seen PT and refer to OT for continues L hand involvement    Patient Stated Goals I want my pinkie and hand to work better so I can typing, pick up glass, lifting and hold objects    Currently in Pain? No/denies                [facility] OT Assessment - 05/09/22 0001       Strength   Right Hand Grip (lbs) 50    Right Hand Lateral Pinch 16 lbs    Right Hand 3  Point Pinch 12 lbs    Left Hand Grip (lbs) 41    Left Hand Lateral Pinch 10 lbs    Left Hand 3 Point Pinch 9 lbs              4th digit MC extention - can do tapping off table - but not for 5th  Or maintain extention Can do ADD of 5th better today but cannot maintain extention Tinel at base of 5th digit now     Reinforce several times during the session with patient about nerve healing as well as progress can be slow.  Several times reinforce weightbearing prior to attempts for placed on hold of extension at fifth digit in several positions.  As well as tapping of fourth digit of table after weightbearing.  And can do some passive range of motion prior.     Reinforced quality is more important than quantity of amount of reps and sets.   Patient able to do lumbrical fist better  maintaining PIP extension.   Change wrist to circular motion into ext and UD 12 reps- with 2 lbs weight - 15 reps pain free     Patient onset of left upper extremity weakness and pain started with spasms and MRI of cervical spine showed some areas of impingement November of last year. Patient did not had a positive Tinel but was tender over dorsal wrist and proximal to wrist over radial nerve 3 months ago  And  2 months distal to wrist and hypo thenar eminence  And 1 month ago [facility] today at base of 5th Recommend pt follow up in 2 months this time                  OT Education - 05/09/22 1632     Education Details Anatomy was progress and nerve healing, home exercises    Person(s) Educated Patient    Methods Explanation;Demonstration;Tactile cues;Verbal cues;Handout    Comprehension Verbal cues required;Returned demonstration;Verbalized understanding                 OT Long Term Goals - 12/29/21 1814       OT LONG TERM GOAL #1   Title Patient to be independent in home program for weightbearing in between facilitation of correct quantity and position of home exercises to increase strength and ulnar side of hand    Baseline Patient needed max verbal cueing and moderate assistance and review of home exercises.  Decrease lumbrical and intrinsic a fist thumb on left side of hand.  Decreased ECU    Time 3    Period Weeks    Status New    Target Date 01/19/22      OT LONG TERM GOAL #2   Title Left fifth digit extension improved for patient to maintain full extension to be able to don gloves and put hand in pocket.    Baseline MC extension -30 degrees at fifth and -20 degrees at fourth MC.-Unable to maintain PIP extension with MC flexed at 90 degrees.  Unable to tap for the fifth digit of the table.    Time 4    Period Weeks    Status New    Target Date 01/26/22      OT LONG TERM GOAL #3   Title Left wrist extension at ECU improved for patient to be able to  stabilize wrist and using three-point pinch to right and put on jewelry.    Baseline Patient with increased stability for  wrist extension during applying earrings and writing; increase strength and ECR but decrease strength and ECU-wrist extension -60 with fourth and fifth digits in flexion    Time 6    Period Weeks    Status New    Target Date 02/09/22      OT LONG TERM GOAL #4   Title Intrinsic a fist and coordination improved for patient to be able to manipulate objects from palm to fingertips, type on computer and cut food with more ease    Baseline Decreased intrinsic a fist at fourth and fifth on left hand difficulty with manipulating objects from palm to fingertips with mostly fourth and fifth impaired; difficulty with maintaining power grip with ulnar side of hand while applying or use radial 3 digits.    Time 8    Period Weeks    Status New    Target Date 02/23/22                   Plan - 05/09/22 1633     Clinical Impression Statement Patient is a 77 year old  refer by Dr Holley Raring -  weakness with greatest loss in fifth digit. Pt denies any sensation issues. She is taking Gabapentin and that is helping for nerve pain. Pt do show decrease strength in 4th with 5th the worse for extention out of the [facility], as well as MC flexion with PIP extention at eval. Since eval pt showed increase extention of 4th - able this date to do tapping off table and place and hold 5th extention but not hyper extention or AROM into full extention. Grip in bilateral hands  decrease but L increase 5 lbs since last timewhen compared to R.  Pt show increase stability at wrist with 3 point.pinch. Review  again with pt nerve healing - progress  today to base of 5th digit.  Tinel 3 months ago it was at wrist and prox to wrist. Last month was at [facility].  Reinforce to do weightbearing prior to any attempts of placing hold fifth digit extension as well as tapping   Pt appear to cont to have some weakness from radial N  involvement, but it is reassuring that sensation is intact.   Semmes Weinstein testing normal. Pt to follow up in 2 months.  Pt can benefit from skilled OT services to increase  strength to increase functional use of L hand to prior level of function    OT Occupational Profile and History Problem Focused Assessment - Including review of records relating to presenting problem    Occupational performance deficits (Please refer to evaluation for details): ADL's;IADL's;Work;Play;Leisure;Social Participation    Body Structure / Function / Physical Skills ADL;Strength;Dexterity;UE functional use;IADL;ROM;Flexibility;Coordination    Rehab Potential Fair    Clinical Decision Making Limited treatment options, no task modification necessary    Comorbidities Affecting Occupational Performance: None    Modification or Assistance to Complete Evaluation  No modification of tasks or assist necessary to complete eval    OT Frequency Monthly    OT Duration 4 weeks    OT Treatment/Interventions Self-care/ADL training;Therapeutic activities;Therapeutic exercise;Passive range of motion;Manual Therapy;Patient/family education    Consulted and Agree with Plan of Care Patient             Patient will benefit from skilled therapeutic intervention in order to improve the following deficits and impairments:   Body Structure / Function / Physical Skills: ADL, Strength, Dexterity, UE functional use, IADL, ROM, Flexibility, Coordination  Visit Diagnosis: Muscle weakness (generalized)    Problem List Patient Active Problem List   Diagnosis Date Noted   Strep pharyngitis 04/20/2022   Cervical radicular pain (left C5,6) 03/17/2021   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/2021   Chronic pain syndrome 03/17/2021   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/2021   Cervical paraspinal muscle spasm 02/10/2021   Gastroesophageal reflux disease 12/28/2020   Personal history of colonic polyps    Polyp  of sigmoid colon    Abdominal pain, epigastric    Patient noncompliant with statin medication 09/12/2019   Type II diabetes mellitus with complication ([place]) 08/81/1031   Hyperlipidemia associated with type 2 diabetes mellitus ([place]) 05/17/2018   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, [place], [address] Phone: [phone]   Fax:  [phone]  Name: MCKAYLAH BETTENDORF MRN: [id number] Date of Birth: Aug 11, 1945

## 2022-05-16 ENCOUNTER — Other Ambulatory Visit: Payer: Self-pay | Admitting: Internal Medicine

## 2022-05-16 DIAGNOSIS — I10 Essential (primary) hypertension: Secondary | ICD-10-CM

## 2022-05-17 NOTE — Telephone Encounter (Signed)
Requested Prescriptions  Pending Prescriptions Disp Refills   amLODipine (NORVASC) 10 MG tablet [Pharmacy Med Name: amLODIPine Besylate 10 MG Oral Tablet] 90 tablet 0    Sig: Take 1 tablet by mouth once daily     Cardiovascular: Calcium Channel Blockers 2 Passed - 05/16/2022  4:19 PM      Passed - Last BP in normal range    BP Readings from Last 1 Encounters:  05/04/22 134/72         Passed - Last Heart Rate in normal range    Pulse Readings from Last 1 Encounters:  05/04/22 64         Passed - Valid encounter within last 6 months    Recent Outpatient Visits           1 week ago Benign essential HTN   [facility] [place], [place], MD   3 weeks ago Strep pharyngitis   [place] [facility] [place], Earley Abide, MD   1 month ago Ritta Slot, oral   [place] at [facility], Jesse Sans, MD   2 months ago RSV infection   [place] [facility], Jesse Sans, MD   4 months ago Viral gastroenteritis   [place] [facility], Jesse Sans, MD       Future Appointments             In 2 months Elayne Snare, MD [facility] Endocrinology             losartan (COZAAR) 100 MG tablet [Pharmacy Med Name: Losartan Potassium 100 MG Oral Tablet] 90 tablet 0    Sig: Take 1 tablet by mouth once daily     Cardiovascular:  Angiotensin Receptor Blockers Passed - 05/16/2022  4:19 PM      Passed - Cr in normal range and within 180 days    Creatinine  Date Value Ref Range Status  11/22/2012 0.78 0.60 - 1.30 mg/dL Final   Creatinine, Ser  Date Value Ref Range Status  04/12/2022 0.70 0.40 - 1.20 mg/dL Final   Creatinine,U  Date Value Ref Range Status  11/22/2021 82.7 mg/dL Final         Passed - K in normal range and within 180 days    Potassium  Date Value Ref  Range Status  04/12/2022 4.1 3.5 - 5.1 mEq/L Final  10/04/2012 4.1 3.5 - 5.1 mmol/L Final         Passed - Patient is not pregnant      Passed - Last BP in normal range    BP Readings from Last 1 Encounters:  05/04/22 134/72         Passed - Valid encounter within last 6 months    Recent Outpatient Visits           1 week ago Benign essential HTN   [facility] [place], Deanna C, MD   3 weeks ago Strep pharyngitis   [facility] [place] [facility] [place], Earley Abide, MD   1 month ago Ritta Slot, oral   [place] at [facility], Jesse Sans, MD   2 months ago RSV infection   [facility] [place] [facility], [facility]   4 months ago Viral gastroenteritis   [facility], Jesse Sans, MD       Future Appointments             In 2 months Elayne Snare, MD [facility] Endocrinology

## 2022-05-22 ENCOUNTER — Other Ambulatory Visit: Payer: Self-pay | Admitting: Internal Medicine

## 2022-05-23 ENCOUNTER — Other Ambulatory Visit: Payer: Self-pay | Admitting: Internal Medicine

## 2022-05-23 NOTE — Telephone Encounter (Signed)
Pt is calling back to follow up on her request for a FREESTYLE LIBRE 2 SENSOR. Pt stated she does not understand why it was denied, as Dr.Berglund prescribed it.  Pt stated is completely out.  Pt is requesting a callback.   Please advise.

## 2022-05-23 NOTE — Telephone Encounter (Signed)
Message says provider is not at this practice.  She said that Dr. Army Melia is the prescriber of this . She said it measures her blood sugar level.  Pt is completely out of the sensor.  CB#  [phone]

## 2022-05-24 ENCOUNTER — Other Ambulatory Visit: Payer: Self-pay

## 2022-05-24 ENCOUNTER — Telehealth: Payer: Self-pay | Admitting: Internal Medicine

## 2022-05-24 DIAGNOSIS — E1165 Type 2 diabetes mellitus with hyperglycemia: Secondary | ICD-10-CM

## 2022-05-24 MED ORDER — FREESTYLE LIBRE 2 SENSOR MISC: 0 refills | Status: DC

## 2022-05-24 NOTE — Telephone Encounter (Signed)
Pt called back I gave message about , needing to get refill from endocronologist, unless there is something else to really, she already had this info

## 2022-05-24 NOTE — Telephone Encounter (Signed)
Copied from [place] [id number]. Topic: General - Other >> May 24, 2022  9:08 AM Everette C wrote: Reason for CRM: The patient would like to be contacted by a member of staff to follow up on the status of their previously requested Continuous Blood Gluc Sensor ([place] 2 SENSOR) [place] [id number]  Please contact the patient further when possible

## 2022-05-24 NOTE — Telephone Encounter (Signed)
Called pt left VM to call back.  Pt needs to get refill from her endocrinologist.  KP

## 2022-05-24 NOTE — Telephone Encounter (Signed)
Pt needs to get refill from endo.  KP

## 2022-06-01 DIAGNOSIS — M79675 Pain in left toe(s): Secondary | ICD-10-CM | POA: Diagnosis not present

## 2022-06-01 DIAGNOSIS — M79674 Pain in right toe(s): Secondary | ICD-10-CM | POA: Diagnosis not present

## 2022-06-01 DIAGNOSIS — E1142 Type 2 diabetes mellitus with diabetic polyneuropathy: Secondary | ICD-10-CM | POA: Diagnosis not present

## 2022-06-01 DIAGNOSIS — B351 Tinea unguium: Secondary | ICD-10-CM | POA: Diagnosis not present

## 2022-06-07 ENCOUNTER — Other Ambulatory Visit: Payer: Self-pay | Admitting: Endocrinology

## 2022-06-07 DIAGNOSIS — E1165 Type 2 diabetes mellitus with hyperglycemia: Secondary | ICD-10-CM

## 2022-06-09 ENCOUNTER — Other Ambulatory Visit: Payer: Self-pay | Admitting: Endocrinology

## 2022-06-09 DIAGNOSIS — E1165 Type 2 diabetes mellitus with hyperglycemia: Secondary | ICD-10-CM

## 2022-06-19 ENCOUNTER — Telehealth: Payer: Self-pay | Admitting: Internal Medicine

## 2022-06-19 NOTE — Telephone Encounter (Signed)
Please review. Not on current med list.   KP

## 2022-06-19 NOTE — Telephone Encounter (Signed)
Pt called to see if Dr. Army Melia will refill meloxicam (MOBIC) 15 MG tablet [id number]  / pt couldn't remember if this was prescribed by Dr. Army Melia / please advise   [place], [place]

## 2022-06-21 ENCOUNTER — Telehealth: Payer: Self-pay | Admitting: Internal Medicine

## 2022-06-21 NOTE — Telephone Encounter (Signed)
Please schedule with Linna Hoff.  KP

## 2022-06-21 NOTE — Telephone Encounter (Signed)
Left voice mail to set up appointment with daniel waddell.

## 2022-06-21 NOTE — Telephone Encounter (Signed)
Copied from [place] [id number] [phone]. Topic: Appointment Scheduling - Scheduling Inquiry for Clinic >> Jun 21, 2022 12:17 PM Santiya F wrote: Reason for CRM: Pt is calling in because she has swelling in her fingers and wants to see PCP. PCP has no availability until 04/23 and pt doesn't want to wait that long. Pt would like to know if there is any way she can be seen sooner. Please advise.

## 2022-06-22 ENCOUNTER — Ambulatory Visit (INDEPENDENT_AMBULATORY_CARE_PROVIDER_SITE_OTHER): Payer: HMO | Admitting: Internal Medicine

## 2022-06-22 ENCOUNTER — Encounter: Payer: Self-pay | Admitting: Internal Medicine

## 2022-06-22 VITALS — BP 124/70 | HR 71 | Ht 62.0 in | Wt 154.2 lb

## 2022-06-22 DIAGNOSIS — M5136 Other intervertebral disc degeneration, lumbar region: Secondary | ICD-10-CM | POA: Diagnosis not present

## 2022-06-22 DIAGNOSIS — M19041 Primary osteoarthritis, right hand: Secondary | ICD-10-CM

## 2022-06-22 MED ORDER — MELOXICAM 15 MG PO TABS: 15.0000 mg | ORAL_TABLET | Freq: Every day | ORAL | 0 refills | Status: DC

## 2022-06-22 NOTE — Progress Notes (Signed)
Date:  06/22/2022   Name:  Melinda Ford   DOB:  [birth year]   MRN:  [id number]   Chief Complaint: Hand Pain (Right ring finger. Swollen and painful.) Right ring finger stiff and slightly swollen.  She was able to remove her rings.  She has general stiffness in both hands, hard to hold heavy items.  She has taken Mobic in the past for her lower back and hip discomfort.  Hip Pain  There was no injury mechanism. The pain is present in the right hip. The quality of the pain is described as aching. The pain is moderate. The pain has been Fluctuating since onset. The symptoms are aggravated by movement and weight bearing. She has tried NSAIDs for the symptoms.    Lab Results  Component Value Date   NA 140 04/12/2022   K 4.1 04/12/2022   CO2 28 04/12/2022   GLUCOSE 105 (H) 04/12/2022   BUN 19 04/12/2022   CREATININE 0.70 04/12/2022   CALCIUM 9.8 04/12/2022   EGFR 82 07/01/2021   GFRNONAA 98 09/10/2015   Lab Results  Component Value Date   CHOL 199 04/12/2022   HDL 84.50 04/12/2022   LDLCALC 97 04/12/2022   TRIG 90.0 04/12/2022   CHOLHDL 2 04/12/2022   Lab Results  Component Value Date   TSH 1.650 12/28/2020   Lab Results  Component Value Date   HGBA1C 8.0 (A) 04/12/2022   Lab Results  Component Value Date   WBC 5.2 07/01/2021   HGB 14.5 07/01/2021   HCT 44.4 07/01/2021   MCV 93 07/01/2021   PLT 284 07/01/2021   Lab Results  Component Value Date   ALT 63 (H) 04/12/2022   AST 49 (H) 04/12/2022   ALKPHOS 87 04/12/2022   BILITOT 0.4 04/12/2022   Lab Results  Component Value Date   VD25OH 34.0 12/28/2020     Review of Systems  Constitutional:  Negative for chills, fatigue and fever.  Respiratory:  Negative for chest tightness and shortness of breath.   Cardiovascular:  Negative for chest pain.  Musculoskeletal:  Positive for arthralgias, back pain and myalgias.  Neurological:  Negative for dizziness and light-headedness.  Psychiatric/Behavioral:  Negative for  dysphoric mood and sleep disturbance. The patient is not nervous/anxious.     Patient Active Problem List   Diagnosis Date Noted   Strep pharyngitis 04/20/2022   Cervical radicular pain (left C5,6) 03/17/2021   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/2021   Chronic pain syndrome 03/17/2021   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/2021   Cervical paraspinal muscle spasm 02/10/2021   Gastroesophageal reflux disease 12/28/2020   Personal history of colonic polyps    Polyp of sigmoid colon    Abdominal pain, epigastric    Patient noncompliant with statin medication 09/12/2019   Type II diabetes mellitus with complication ([place]) [id number]   Hyperlipidemia associated with type 2 diabetes mellitus ([place]) 05/17/2018   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 02/22/2018   Chronic midline thoracic back pain 02/22/2018   Tinea versicolor 09/20/2016   OSA (obstructive sleep apnea) 08/23/2016   Allergic rhinitis 07/13/2016   Overweight (BMI 25.0-29.9) 10/01/2015   Vitamin D deficiency 09/18/2014   Onychomycosis 09/16/2014   Degenerative disc disease, lumbar 09/16/2014   Benign essential HTN 10/10/2010   Osteoporosis 10/10/2010    Allergies  Allergen Reactions   Ace Inhibitors Cough   Wheat Hives   Gramineae Pollens     Past Surgical History:  Procedure Laterality Date  ABDOMINAL HYSTERECTOMY     CATARACT EXTRACTION Right 2020   COLONOSCOPY WITH PROPOFOL N/A 10/10/2019   Procedure: COLONOSCOPY WITH PROPOFOL;  Surgeon: Lucilla Lame, MD;  Location: [facility];  Service: Endoscopy;  Laterality: N/A;   ESOPHAGOGASTRODUODENOSCOPY (EGD) WITH PROPOFOL N/A 10/10/2019   Procedure: ESOPHAGOGASTRODUODENOSCOPY (EGD) WITH PROPOFOL;  Surgeon: Lucilla Lame, MD;  Location: [facility];  Service: Endoscopy;  Laterality: N/A;  Diabetic - insulin pump and oral meds sleep apnea   POLYPECTOMY  10/10/2019   Procedure: POLYPECTOMY;  Surgeon: Lucilla Lame, MD;   Location: [facility];  Service: Endoscopy;;    Social History   Tobacco Use   Smoking status: Never   Smokeless tobacco: Never  Vaping Use   Vaping Use: Never used  Substance Use Topics   Alcohol use: Not Currently    Alcohol/week: 1.0 standard drink of alcohol    Types: 1 Standard drinks or equivalent per week   Drug use: Never     Medication list has been reviewed and updated.  Current Meds  Medication Sig   acetaminophen (TYLENOL) 500 MG tablet Take 1,000 mg by mouth 2 (two) times daily. As needed   amLODipine (NORVASC) 10 MG tablet Take 1 tablet by mouth once daily   Calcium Carbonate-Vit D-Min (CALCIUM 1200 PO) Take 2 each by mouth daily.   Cholecalciferol (VITAMIN D) 2000 UNITS CAPS Take 1 capsule (2,000 Units total) by mouth daily.   clotrimazole-betamethasone (LOTRISONE) cream Apply 1 Application topically daily. To feet and rash under breasts   Continuous Blood Gluc Sensor (FREESTYLE LIBRE 2 SENSOR) MISC USE AS DIRECTED   GINKGO BILOBA COMPLEX PO Take 1 capsule by mouth daily.   glucose blood (ONETOUCH VERIO) test strip USE 1 STRIP TO CHECK GLUCOSE 4 TIMES DAILY   insulin aspart (NOVOLOG) 100 UNIT/ML injection USE A MAX OF 90 UNITS DAILY VIA INSULIN PUMP   Insulin Disposable Pump (V-GO 20) 20 UNIT/24HR KIT 1  ONCE DAILY   insulin lispro (HUMALOG) 100 UNIT/ML injection USE A MAX OF 90 UNITS DAILY VIA INSULIN PUMP   Insulin Pen Needle (PEN NEEDLES) 32G X 5 MM MISC Please   losartan (COZAAR) 100 MG tablet Take 1 tablet by mouth once daily   meloxicam (MOBIC) 15 MG tablet Take 1 tablet (15 mg total) by mouth daily.   NON FORMULARY CPAP @@ 9 cm H2O nightly   omeprazole (PRILOSEC) 20 MG capsule Take 1 capsule (20 mg total) by mouth 2 (two) times daily before a meal. (Patient taking differently: Take 20 mg by mouth daily.)   pravastatin (PRAVACHOL) 20 MG tablet Take 1 tablet by mouth once daily (Patient taking differently: Take 20 mg by mouth 3 (three) times a week.)    Semaglutide, 1 MG/DOSE, (OZEMPIC, 1 MG/DOSE,) 4 MG/3ML SOPN Inject 1 mg weekly   triamcinolone cream (KENALOG) 0.5 % Apply 1 Application topically 3 (three) times daily. To rash on neck       05/04/2022    4:44 PM 04/05/2022    1:49 PM 03/13/2022   11:41 AM 12/26/2021    9:00 AM  GAD 7 : Generalized Anxiety Score  Nervous, Anxious, on Edge 0 0 1 0  Control/stop worrying 0 0 1 0  Worry too much - different things 0 1 0 1  Trouble relaxing 0 0 0 0  Restless 0 0 0 0  Easily annoyed or irritable 0 0 0 0  Afraid - awful might happen 0 0 0 0  Total GAD 7  Score 0 1 2 1   Anxiety Difficulty Not difficult at all Not difficult at all Not difficult at all Not difficult at all       05/04/2022    4:44 PM 04/05/2022    1:49 PM 03/13/2022   11:41 AM  Depression screen PHQ 2/9  Decreased Interest 0 0 0  Down, Depressed, Hopeless 0 0 0  PHQ - 2 Score 0 0 0  Altered sleeping 0 0 0  Tired, decreased energy 0 1 0  Change in appetite 0 0 0  Feeling bad or failure about yourself  0 0 0  Trouble concentrating 0 0 0  Moving slowly or fidgety/restless 0 0 1  Suicidal thoughts 0 0 0  PHQ-9 Score 0 1 1  Difficult doing work/chores Not difficult at all Not difficult at all Not difficult at all    BP Readings from Last 3 Encounters:  06/22/22 124/70  05/04/22 134/72  04/20/22 128/80    Physical Exam Vitals and nursing note reviewed.  Constitutional:      General: She is not in acute distress.    Appearance: Normal appearance. She is well-developed.  HENT:     Head: Normocephalic and atraumatic.  Cardiovascular:     Rate and Rhythm: Normal rate and regular rhythm.  Pulmonary:     Effort: Pulmonary effort is normal. No respiratory distress.     Breath sounds: No wheezing or rhonchi.  Musculoskeletal:     Right hand: Swelling (base of ring finger) present. Decreased range of motion.     Left hand: No swelling or deformity. Decreased range of motion.  Skin:    General: Skin is warm and dry.      Findings: No rash.  Neurological:     Mental Status: She is alert and oriented to person, place, and time.  Psychiatric:        Mood and Affect: Mood normal.        Behavior: Behavior normal.     Wt Readings from Last 3 Encounters:  06/22/22 154 lb 3.2 oz (69.9 kg)  05/04/22 156 lb (70.8 kg)  04/20/22 156 lb (70.8 kg)    BP 124/70   Pulse 71   Ht 5\' 2"  (1.575 m)   Wt 154 lb 3.2 oz (69.9 kg)   SpO2 98%   BMI 28.20 kg/m   Assessment and Plan:  Problem List Items Addressed This Visit       Musculoskeletal and Integument   Degenerative disc disease, lumbar   Relevant Medications   meloxicam (MOBIC) 15 MG tablet   Other Visit Diagnoses     Primary osteoarthritis of right hand    -  Primary   Relevant Medications   meloxicam (MOBIC) 15 MG tablet       Return in about 6 months (around 12/23/2022) for HTN, DM.   Partially dictated using [place], any errors are not intentional.  Glean Hess, MD [place], [place]

## 2022-07-11 ENCOUNTER — Ambulatory Visit: Payer: PPO | Attending: Student in an Organized Health Care Education/Training Program | Admitting: Occupational Therapy

## 2022-07-11 ENCOUNTER — Other Ambulatory Visit: Payer: Self-pay | Admitting: Endocrinology

## 2022-07-11 DIAGNOSIS — M5413 Radiculopathy, cervicothoracic region: Secondary | ICD-10-CM | POA: Insufficient documentation

## 2022-07-11 DIAGNOSIS — M79602 Pain in left arm: Secondary | ICD-10-CM | POA: Diagnosis not present

## 2022-07-11 DIAGNOSIS — M6281 Muscle weakness (generalized): Secondary | ICD-10-CM | POA: Insufficient documentation

## 2022-07-11 DIAGNOSIS — E1165 Type 2 diabetes mellitus with hyperglycemia: Secondary | ICD-10-CM

## 2022-07-11 NOTE — Therapy (Signed)
[facility] [address] [place], [place], [address] Phone: [phone]   Fax:  [phone]  Occupational Therapy Treatment  Patient Details  Name: Melinda Ford MRN: [id number] Date of Birth: [birth year] Referring Provider (OT): DR Cherylann RatelLateef   Encounter Date: 07/11/2022   OT End of Session - [birth year] 1119     Visit Number 8    Number of Visits 8    Date for OT Re-Evaluation 07/11/22    OT Start Time 1050    OT Stop Time 1116    OT Time Calculation (min) 26 min    Activity Tolerance Patient tolerated treatment well    Behavior [facility] for tasks assessed/performed             Past Medical History:  Diagnosis Date   Allergy    Arthritis    Cataract    Diabetes mellitus without complication (HCC)    Hyperlipidemia    Hypertension    Motion sickness    planes   OSA (obstructive sleep apnea) 08/23/2016   CPAP   Osteoporosis    Presence of dental prosthetic device    implants   Radiculopathy of lumbosacral region 06/22/2016   Right lateral epicondylitis 06/04/2017   Right sided sciatica 03/09/2016   Sleep apnea     Past Surgical History:  Procedure Laterality Date   ABDOMINAL HYSTERECTOMY     CATARACT EXTRACTION Right 2020   COLONOSCOPY WITH PROPOFOL N/A 10/10/2019   Procedure: COLONOSCOPY WITH PROPOFOL;  Surgeon: Midge MiniumWohl, Darren, MD;  [facility];  Service: Endoscopy;  Laterality: N/A;   ESOPHAGOGASTRODUODENOSCOPY (EGD) WITH PROPOFOL N/A 10/10/2019   Procedure: ESOPHAGOGASTRODUODENOSCOPY (EGD) WITH PROPOFOL;  Surgeon: Midge MiniumWohl, Darren, MD;  [facility];  Service: Endoscopy;  Laterality: N/A;  Diabetic - insulin pump and oral meds sleep apnea   POLYPECTOMY  10/10/2019   Procedure: POLYPECTOMY;  Surgeon: Midge MiniumWohl, Darren, MD;  [facility];  Service: Endoscopy;;    There were no vitals filed for this visit.   Subjective Assessment - 07/11/22 1117     Subjective  My  L pinkie and ring finger about the same -but my R ring finger now hurting and wants to drop down into my palm- think it is arthritis    Pertinent History She is having difficulty with left hand lifting, typing, jewelry and gripping- pinkie not work well.  Of note, she has cervical spondylosis with impingement at C4-C5, C3-C4.  She also has an accentuated central cord signal at C4-C5 that could be contributing to her radicular symptoms.  She is currently on gabapentin.  At Dr Garnett FarmLateef's visit she was  interested in learning more about a cervical epidural steroid injection.  We discussed this at length including the risks and benefits associated with injection.  Patient states that she will think about this further and let us know if she would like to proceed.    Pt just seen PT and refer to OT for continues L hand involvement    Patient Stated Goals I want my pinkie and hand to work better so I can typing, pick up glass, lifting and hold objects    Currently in Pain? Yes    Pain Score 3     Pain Location Finger (Comment which one)    Pain Orientation Right    Pain Descriptors / Indicators Sore;Aching;Tender;Tightness    Pain Type Acute pain    Pain Onset More than a month ago  Pain Frequency Intermittent                [facility] OT Assessment - 07/11/22 0001       Strength   Right Hand Grip (lbs) 50    Right Hand Lateral Pinch 16 lbs    Right Hand 3 Point Pinch 12 lbs    Left Hand Grip (lbs) 45    Left Hand Lateral Pinch 10 lbs    Left Hand 3 Point Pinch 10 lbs              4th digit MC extention - can do tapping off table - but not for 5th  Or maintain extention Can do ADD of 5th better  but cannot maintain extention Tinel at base of 5th digit     Reinforced with patient that it can take up to a year with nerve injuries at the cervical spine.   Reinforced with patient to do weightbearing prior to any home exercises for extension of fourth and fifth digits on the left.      Patient able to do lumbrical fist better as well as intrinsic a fist and composite with good strength Grip strength improved 4 pounds as well as 3.2 within normal limits compared to the right    Patient onset of left upper extremity weakness and pain started with spasms and MRI of cervical spine showed some areas of impingement November of last year. Patient to continue with tapping of fourth and fifth digits as well as doing different extension exercises and placing hold for fourth and fifth  Review also rubber band placed on hold extension for fourth and fifth   .  Patient arrived this date with reports of onset of right fourth digit pain a month ago.   Pain at fourth PIP as well as tenderness over the A1 pulley.?  Tenosynovitis  Fourth digit dropping into the palm but no triggering reported  Increased pain and stiffness at the fourth PIP.   Patient can do some contrast with the right hand to decrease pain and ice massage over the A1 pulley   If no improvement in a week or 2 recommend for patient to call Dr. Ashley Royalty for possible shot. Patient to has Voltaren ointment that she can try.    Patient to continue at home with home program              OT Education - 07/11/22 1119     Education Details Anatomy was progress and nerve healing, home exercises    Person(s) Educated Patient    Methods Explanation;Demonstration;Tactile cues;Verbal cues;Handout    Comprehension Verbal cues required;Returned demonstration;Verbalized understanding                 OT Long Term Goals - 07/11/22 1125       OT LONG TERM GOAL #1   Title Patient to be independent in home program for weightbearing in between facilitation of correct quantity and position of home exercises to increase strength and ulnar side of hand    Status Achieved      OT LONG TERM GOAL #2   Title Left fifth digit extension improved for patient to maintain full extension to be able to don gloves and put hand in  pocket.    Status Partially Met      OT LONG TERM GOAL #3   Title Left wrist extension at ECU improved for patient to be able to stabilize wrist and using three-point pinch to right and  put on jewelry.    Status Achieved      OT LONG TERM GOAL #4   Title Intrinsic a fist and coordination improved for patient to be able to manipulate objects from palm to fingertips, type on computer and cut food with more ease    Status Partially Met                   Plan - 07/11/22 1119     Clinical Impression Statement Patient is a 77 year old  refer by Dr Cherylann Ratel -  weakness with greatest loss in fifth digit. Pt denies any sensation issues. She is taking Gabapentin and that is helping for nerve pain. Pt do show decrease strength in 4th with 5th the worse for extention out of the [facility], as well as MC flexion with PIP extention at eval. Since eval pt showed increase composite flexion of 4th and 5th - able to do place and hold 4th extetnion more than 5th. Can do intrinsic fist and lumbrical. Grip increased to 42 lbs and 3 point WNL compare to L . Pt show increase stability at wrist with 3 point.pinch. Review  again with pt nerve healing and can take a year from cervical . Tinel 5 months ago it was at wrist and prox to wrist.3 months ago was at [facility]  Reinforce to do weightbearing prior to any attempts of placing hold fifth digit extension as well as tapping and rubber band - to cont with - and  Phoebe Perch testing normal but diminished in 5th middle and distal phalanges. Pt to cont at home with HEP. Pt arrive today with reports of  pain for month at R 4th PIP and tenderness over A1pulley - Tenosynovitis - no triggering but 4th digit dropping into palm- and pain and stiffness in 4th PIP- recommend for pt if not better in 1-2 wks - call Dr Ashley Royalty for possible shot.    OT Occupational Profile and History Problem Focused Assessment - Including review of records relating to presenting problem    Occupational  performance deficits (Please refer to evaluation for details): ADL's;IADL's;Work;Play;Leisure;Social Participation    Body Structure / Function / Physical Skills ADL;Strength;Dexterity;UE functional use;IADL;ROM;Flexibility;Coordination    Rehab Potential Fair    Clinical Decision Making Limited treatment options, no task modification necessary    Comorbidities Affecting Occupational Performance: None    Modification or Assistance to Complete Evaluation  No modification of tasks or assist necessary to complete eval    OT Frequency 1x / week    OT Duration --   1 wk   OT Treatment/Interventions Self-care/ADL training;Therapeutic activities;Therapeutic exercise;Passive range of motion;Manual Therapy;Patient/family education    Consulted and Agree with Plan of Care Patient             Patient will benefit from skilled therapeutic intervention in order to improve the following deficits and impairments:   Body Structure / Function / Physical Skills: ADL, Strength, Dexterity, UE functional use, IADL, ROM, Flexibility, Coordination       Visit Diagnosis: Muscle weakness (generalized)  Pain in left arm  Radiculopathy, cervicothoracic region    Problem List Patient Active Problem List   Diagnosis Date Noted   Strep pharyngitis 04/20/2022   Cervical radicular pain (left C5,6) 03/17/2021   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/2021   Chronic pain syndrome 03/17/2021   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/2021   Cervical paraspinal muscle spasm 02/10/2021   Gastroesophageal reflux disease 12/28/2020   Personal history of colonic  polyps    Polyp of sigmoid colon    Abdominal pain, epigastric    Patient noncompliant with statin medication 09/12/2019   Type II diabetes mellitus with complication 06/04/2019   Hyperlipidemia associated with type 2 diabetes mellitus 05/17/2018   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, [place], [address] Phone: [phone]   Fax:  [phone]  Name: TAIRRA BULLIS MRN: [id number] Date of Birth: 08-May-1945

## 2022-07-14 ENCOUNTER — Telehealth: Payer: Self-pay | Admitting: Internal Medicine

## 2022-07-14 NOTE — Telephone Encounter (Signed)
Please schedule appt with Dr. Ashley Royalty.  KP

## 2022-07-14 NOTE — Telephone Encounter (Signed)
Copied from CRM [phone]. Topic: General - Other >> Jul 14, 2022  8:19 AM Dondra Prader A wrote: Reason for CRM: Pt is wanting to see if her PCP can read over her appt notes from her hand doctor. Per pt she went to the hand doctor on Tuesday 07/11/22. Pt is still having issues with her ring finger on her right hand. Per pt it is still has arthritis and is having pain. The hand doctor advised pt that she might need to see Dr. Ashley Royalty for injections and the hand doctor is worried about it going into the trigger finger. Pt would like for PCP to give her a call back to discuss.

## 2022-07-19 ENCOUNTER — Ambulatory Visit (INDEPENDENT_AMBULATORY_CARE_PROVIDER_SITE_OTHER): Payer: HMO | Admitting: Family Medicine

## 2022-07-19 ENCOUNTER — Ambulatory Visit
Admission: RE | Admit: 2022-07-19 | Discharge: 2022-07-19 | Disposition: A | Discharge status: Home / Self Care | Payer: PPO | Source: Ambulatory Visit | Attending: Family Medicine | Admitting: Family Medicine

## 2022-07-19 ENCOUNTER — Encounter: Payer: Self-pay | Admitting: Family Medicine

## 2022-07-19 ENCOUNTER — Ambulatory Visit
Admission: RE | Admit: 2022-07-19 | Discharge: 2022-07-19 | Disposition: A | Discharge status: Home / Self Care | Payer: PPO | Attending: Family Medicine | Admitting: Family Medicine

## 2022-07-19 VITALS — BP 128/78 | HR 80 | Ht 62.0 in | Wt 157.0 lb

## 2022-07-19 DIAGNOSIS — M79641 Pain in right hand: Secondary | ICD-10-CM

## 2022-07-19 DIAGNOSIS — M65341 Trigger finger, right ring finger: Secondary | ICD-10-CM

## 2022-07-19 DIAGNOSIS — M19041 Primary osteoarthritis, right hand: Secondary | ICD-10-CM | POA: Diagnosis not present

## 2022-07-21 ENCOUNTER — Ambulatory Visit: Payer: HMO | Admitting: Endocrinology

## 2022-07-23 DIAGNOSIS — M65341 Trigger finger, right ring finger: Secondary | ICD-10-CM | POA: Insufficient documentation

## 2022-07-23 NOTE — Assessment & Plan Note (Signed)
Atraumatic right 4th digit pain, stiffness, catching. Findings consistent with right 4th trigger finger.  Offered treatment options, plan as follows: - trigger finger splint - topical Voltaren - suboptimal progress to be addressed with cortisone injection

## 2022-07-23 NOTE — Progress Notes (Signed)
     Primary Care / Sports Medicine Office Visit  Patient Information:  Patient ID: Melinda Ford, female DOB: Feb 15, 1946 Age: 77 y.o. MRN: [id number]   Melinda Ford is a pleasant 77 y.o. female presenting with the following:  Chief Complaint  Patient presents with   Hand Pain    Right ring finger 2 months, had pinch nerve in left hand.     Vitals:   07/19/22 0905  BP: 128/78  Pulse: 80  SpO2: 99%   Vitals:   07/19/22 0905  Weight: 157 lb (71.2 kg)  Height:  (1.575 m)   Body mass index is 28.72 kg/m.     Independent interpretation of notes and tests performed by another provider:   Independent interpretation of right hand demonstrates scattered OA primarily about the 1st IP, otherwise noted throughout PIP and DIP joints  Procedures performed:   None  Pertinent History, Exam, Impression, and Recommendations:   Melinda Ford was seen today for hand pain.  Trigger ring finger of right hand Assessment & Plan: Atraumatic right 4th digit pain, stiffness, catching. Findings consistent with right 4th trigger finger.  Offered treatment options, plan as follows: - trigger finger splint - topical Voltaren - suboptimal progress to be addressed with cortisone injection  Orders: -     DG Hand Complete Right; Future     Orders & Medications No orders of the defined types were placed in this encounter.  Orders Placed This Encounter  Procedures   DG Hand Complete Right     No follow-ups on file.     Jerrol Banana, MD, [facility]   Primary Care Sports Medicine Primary Care and Sports Medicine at [facility]

## 2022-07-24 ENCOUNTER — Telehealth: Payer: Self-pay | Admitting: Internal Medicine

## 2022-07-24 NOTE — Telephone Encounter (Signed)
Contacted Aryka V Cooley to schedule their annual wellness visit. Appointment made for 08/17/2022.  Verlee Rossetti; Care Guide Ambulatory Clinical Support [place] [facility] Direct Dial: [phone]

## 2022-07-27 ENCOUNTER — Ambulatory Visit: Payer: HMO | Admitting: Family Medicine

## 2022-07-27 ENCOUNTER — Ambulatory Visit: Payer: Self-pay | Admitting: *Deleted

## 2022-07-27 NOTE — Telephone Encounter (Signed)
Summary: Pt seeking sooner appt than what is available   Pt requests an appt with Dr. Judithann Graves today or tomorrow for pain in right pinky toe. Pt stated she hurt her toe last week and she is not sure if it is broken. Offered pt the next available appt but she declined and stated she can not wait a few weeks to be seen. Pt requests call back to discuss other options. Cb# [phone]     Attempted to call patient- no answer and VM not set up

## 2022-07-27 NOTE — Telephone Encounter (Signed)
  Chief Complaint: Toe Injury Symptoms: Right foot, 5th toe "Ran into furniture" States mild swelling, "Still really painful."  Frequency:  2 weeks ago. HAs event SAturday "And I want to be sure I should be walking on it."  Pertinent Negatives: Patient denies open wound, laceration Disposition: ED /[] Urgent Care (no appt availability in office) / Appointment(In office/virtual)/  [facility]/ Home Care/ Refused Recommended Disposition /[] [facility] [place] [facility]/  Follow-up with PCP Additional Notes: Pt is diabetic. Requested early appt. Anxious "I don't know why it still hurts so bad."  Reason for Disposition  [1] Toe injury AND [2] bad limp or can't wear shoes/sandals  Answer Assessment - Initial Assessment Questions 1. MECHANISM: "How did the injury happen?"      Hit toe on furniture 2. ONSET: "When did the injury happen?" (Minutes or hours ago)      2 weeks ago 3. LOCATION: "What part of the toe is injured?" "Is the nail damaged?"      Little toe on right foot 4. APPEARANCE of TOE INJURY: "What does the injury look like?"      Looks fine just painful. Little swollen 5. SEVERITY: "Can you use the foot normally?" "Can you walk?"      Yes 6. SIZE: For cuts, bruises, or swelling, ask: "How large is it?" (e.g., inches or centimeters;  entire toe)      NA 7. PAIN: "Is there pain?" If Yes, ask: "How bad is the pain?"   (e.g., Scale 1-10; or mild, moderate, severe)     Yes right toe.  8. TETANUS: For any breaks in the skin, ask: "When was the last tetanus booster?"     no 9. DIABETES: "Do you have a history of diabetes or poor circulation in the feet?"     Yes 10. OTHER SYMPTOMS: "Do you have any other symptoms?"        No  Protocols used: Toe Injury-A-AH

## 2022-07-28 ENCOUNTER — Ambulatory Visit
Admission: RE | Admit: 2022-07-28 | Discharge: 2022-07-28 | Disposition: A | Discharge status: Home / Self Care | Payer: PPO | Source: Ambulatory Visit | Attending: Internal Medicine | Admitting: Internal Medicine

## 2022-07-28 ENCOUNTER — Ambulatory Visit
Admission: RE | Admit: 2022-07-28 | Discharge: 2022-07-28 | Disposition: A | Discharge status: Home / Self Care | Payer: PPO | Attending: Internal Medicine | Admitting: Internal Medicine

## 2022-07-28 ENCOUNTER — Ambulatory Visit: Payer: HMO | Admitting: Internal Medicine

## 2022-07-28 ENCOUNTER — Encounter: Payer: Self-pay | Admitting: Internal Medicine

## 2022-07-28 ENCOUNTER — Ambulatory Visit (INDEPENDENT_AMBULATORY_CARE_PROVIDER_SITE_OTHER): Payer: HMO | Admitting: Internal Medicine

## 2022-07-28 ENCOUNTER — Ambulatory Visit: Payer: PPO

## 2022-07-28 VITALS — BP 110/78 | HR 72 | Ht 62.0 in | Wt 157.0 lb

## 2022-07-28 DIAGNOSIS — M79674 Pain in right toe(s): Secondary | ICD-10-CM

## 2022-07-28 DIAGNOSIS — S92514A Nondisplaced fracture of proximal phalanx of right lesser toe(s), initial encounter for closed fracture: Secondary | ICD-10-CM | POA: Diagnosis not present

## 2022-07-28 NOTE — Patient Instructions (Signed)
-  It was a pleasure to see you today! Please review your visit summary for helpful information. -Lab results are usually available within 1-2 days and we will call once reviewed. -I would encourage you to follow your care via MyChart where you can access lab results, notes, messages, and more. -If you feel that we did a nice job today, please complete your after-visit survey and leave us a Google review! Your CMA today was Braidyn Peace and your provider was Dr Laura Berglund, MD.  

## 2022-07-28 NOTE — Progress Notes (Signed)
Date:  07/28/2022   Name:  Melinda Ford   DOB:  [birth year]   MRN:  [id number]   Chief Complaint: Toe Pain (Right Pinky Toe. Hit her toe on her husbands foot 2 weeks ago and still having pain.)  Foot Injury  Incident onset: 2 weeks ago. The incident occurred at home. The injury mechanism was a direct blow. The pain is present in the right foot. The quality of the pain is described as aching and shooting. The pain is moderate. The pain has been Fluctuating since onset. Associated symptoms include an inability to bear weight. She reports no foreign bodies present. The symptoms are aggravated by palpation and weight bearing. She has tried acetaminophen, elevation and ice for the symptoms.    Lab Results  Component Value Date   NA 140 04/12/2022   K 4.1 04/12/2022   CO2 28 04/12/2022   GLUCOSE 105 (H) 04/12/2022   BUN 19 04/12/2022   CREATININE 0.70 04/12/2022   CALCIUM 9.8 04/12/2022   EGFR 82 07/01/2021   GFRNONAA 98 09/10/2015   Lab Results  Component Value Date   CHOL 199 04/12/2022   HDL 84.50 04/12/2022   LDLCALC 97 04/12/2022   TRIG 90.0 04/12/2022   CHOLHDL 2 04/12/2022   Lab Results  Component Value Date   TSH 1.650 12/28/2020   Lab Results  Component Value Date   HGBA1C 8.0 (A) 04/12/2022   Lab Results  Component Value Date   WBC 5.2 07/01/2021   HGB 14.5 07/01/2021   HCT 44.4 07/01/2021   MCV 93 07/01/2021   PLT 284 07/01/2021   Lab Results  Component Value Date   ALT 63 (H) 04/12/2022   AST 49 (H) 04/12/2022   ALKPHOS 87 04/12/2022   BILITOT 0.4 04/12/2022   Lab Results  Component Value Date   VD25OH 34.0 12/28/2020     Review of Systems  Constitutional:  Negative for fatigue.  Respiratory:  Negative for chest tightness and shortness of breath.   Musculoskeletal:  Positive for arthralgias, gait problem and joint swelling.  Psychiatric/Behavioral:  Negative for dysphoric mood and sleep disturbance. The patient is not nervous/anxious.      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   Cervical paraspinal muscle spasm 02/10/2021   Gastroesophageal reflux disease 12/28/2020   Personal history of colonic polyps    Polyp of sigmoid colon    Abdominal pain, epigastric    Patient noncompliant with statin medication 09/12/2019   Type II diabetes mellitus with complication (HCC) 06/04/2019   Hyperlipidemia associated with type 2 diabetes mellitus (HCC) 05/17/2018   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 02/22/2018   Chronic midline thoracic back pain 02/22/2018   Tinea versicolor 09/20/2016   OSA (obstructive sleep apnea) 08/23/2016   Allergic rhinitis 07/13/2016   Overweight (BMI 25.0-29.9) 10/01/2015   Vitamin D deficiency 09/18/2014   Onychomycosis 09/16/2014   Degenerative disc disease, lumbar 09/16/2014   Benign essential HTN 10/10/2010   Osteoporosis 10/10/2010    Allergies  Allergen Reactions   Ace Inhibitors Cough   Wheat Hives   Gramineae Pollens     Past Surgical History:  Procedure Laterality Date   ABDOMINAL HYSTERECTOMY     CATARACT  EXTRACTION Right 2020   COLONOSCOPY WITH PROPOFOL N/A 10/10/2019   Procedure: COLONOSCOPY WITH PROPOFOL;  Surgeon: Midge Minium, MD;  Location: [facility];  Service: Endoscopy;  Laterality: N/A;   ESOPHAGOGASTRODUODENOSCOPY (EGD) WITH PROPOFOL N/A 10/10/2019   Procedure: ESOPHAGOGASTRODUODENOSCOPY (EGD) WITH PROPOFOL;  Surgeon: Midge Minium, MD;  Location: [facility];  Service: Endoscopy;  Laterality: N/A;  Diabetic - insulin pump and oral meds sleep apnea   POLYPECTOMY  10/10/2019   Procedure: POLYPECTOMY;  Surgeon: Midge Minium, MD;  Location: [facility];   Service: Endoscopy;;    Social History   Tobacco Use   Smoking status: Never   Smokeless tobacco: Never  Vaping Use   Vaping Use: Never used  Substance Use Topics   Alcohol use: Not Currently    Alcohol/week: 1.0 standard drink of alcohol    Types: 1 Standard drinks or equivalent per week   Drug use: Never     Medication list has been reviewed and updated.  Current Meds  Medication Sig   acetaminophen (TYLENOL) 500 MG tablet Take 1,000 mg by mouth 2 (two) times daily. As needed   amLODipine (NORVASC) 10 MG tablet Take 1 tablet by mouth once daily   Calcium Carbonate-Vit D-Min (CALCIUM 1200 PO) Take 2 each by mouth daily.   Cholecalciferol (VITAMIN D) 2000 UNITS CAPS Take 1 capsule (2,000 Units total) by mouth daily.   clotrimazole-betamethasone (LOTRISONE) cream Apply 1 Application topically daily. To feet and rash under breasts   Continuous Blood Gluc Sensor (FREESTYLE LIBRE 2 SENSOR) MISC USE AS DIRECTED   GINKGO BILOBA COMPLEX PO Take 1 capsule by mouth daily.   glucose blood (ONETOUCH VERIO) test strip USE 1 STRIP TO CHECK GLUCOSE 4 TIMES DAILY   insulin aspart (NOVOLOG) 100 UNIT/ML injection USE A MAX OF 90 UNITS DAILY VIA INSULIN PUMP   Insulin Disposable Pump (V-GO 20) 20 UNIT/24HR KIT 1  ONCE DAILY   insulin lispro (HUMALOG) 100 UNIT/ML injection USE A MAX OF 90 UNITS DAILY VIA INSULIN PUMP   Insulin Pen Needle (PEN NEEDLES) 32G X 5 MM MISC Please   losartan (COZAAR) 100 MG tablet Take 1 tablet by mouth once daily   meloxicam (MOBIC) 15 MG tablet Take 1 tablet (15 mg total) by mouth daily.   NON FORMULARY CPAP @@ 9 cm H2O nightly   omeprazole (PRILOSEC) 20 MG capsule Take 1 capsule (20 mg total) by mouth 2 (two) times daily before a meal. (Patient taking differently: Take 20 mg by mouth daily.)   pravastatin (PRAVACHOL) 20 MG tablet Take 1 tablet by mouth once daily (Patient taking differently: Take 20 mg by mouth 3 (three) times a week.)   Semaglutide, 1 MG/DOSE,  (OZEMPIC, 1 MG/DOSE,) 4 MG/3ML SOPN Inject 1 mg weekly   triamcinolone cream (KENALOG) 0.5 % Apply 1 Application topically 3 (three) times daily. To rash on neck       07/28/2022    9:24 AM 05/04/2022    4:44 PM 04/05/2022    1:49 PM 03/13/2022   11:41 AM  GAD 7 : Generalized Anxiety Score  Nervous, Anxious, on Edge 0 0 0 1  Control/stop worrying 1 0 0 1  Worry too much - different things 1 0 1 0  Trouble relaxing 0 0 0 0  Restless 0 0 0 0  Easily annoyed or irritable 0 0 0 0  Afraid - awful might happen 0 0 0 0  Total GAD 7 Score 2 0 1 2  Anxiety  Difficulty Not difficult at all Not difficult at all Not difficult at all Not difficult at all       07/28/2022    9:24 AM 05/04/2022    4:44 PM 04/05/2022    1:49 PM  Depression screen PHQ 2/9  Decreased Interest 0 0 0  Down, Depressed, Hopeless 0 0 0  PHQ - 2 Score 0 0 0  Altered sleeping 0 0 0  Tired, decreased energy 2 0 1  Change in appetite 2 0 0  Feeling bad or failure about yourself  1 0 0  Trouble concentrating 0 0 0  Moving slowly or fidgety/restless 1 0 0  Suicidal thoughts 0 0 0  PHQ-9 Score 6 0 1  Difficult doing work/chores Not difficult at all Not difficult at all Not difficult at all    BP Readings from Last 3 Encounters:  07/28/22 110/78  07/19/22 128/78  06/22/22 124/70    Physical Exam Vitals and nursing note reviewed.  Constitutional:      General: She is not in acute distress.    Appearance: She is well-developed.  HENT:     Head: Normocephalic and atraumatic.  Pulmonary:     Effort: Pulmonary effort is normal. No respiratory distress.  Musculoskeletal:     Right foot: Swelling, deformity and tenderness (fifth toe) present.  Skin:    General: Skin is warm and dry.     Findings: No rash.  Neurological:     Mental Status: She is alert and oriented to person, place, and time.  Psychiatric:        Mood and Affect: Mood normal.        Behavior: Behavior normal.     Wt Readings from Last 3  Encounters:  07/28/22 157 lb (71.2 kg)  07/19/22 157 lb (71.2 kg)  06/22/22 154 lb 3.2 oz (69.9 kg)    BP 110/78   Pulse 72   Ht 5\' 2"  (1.575 m)   Wt 157 lb (71.2 kg)   SpO2 98%   BMI 28.72 kg/m   Assessment and Plan:  Problem List Items Addressed This Visit   None Visit Diagnoses     Toe pain, right    -  Primary   continue supportive shoes, elevation, ice and Tylenol   Relevant Orders   DG Foot Complete Right       No follow-ups on file.   Partially dictated using Dragon software, any errors are not intentional.  Reubin Milan, MD [facility] Primary Care and Sports Medicine [place], [place]

## 2022-08-03 ENCOUNTER — Telehealth: Payer: Self-pay | Admitting: Endocrinology

## 2022-08-03 NOTE — Telephone Encounter (Signed)
Patient is calling to say that she needs help with the dosage setting for Ozempic.  She is not sure that she has the setting correct.

## 2022-08-03 NOTE — Telephone Encounter (Signed)
See previosu message

## 2022-08-03 NOTE — Telephone Encounter (Signed)
Returned call to H#, no answer, LVM. Called secondary nbr, no answer and no vm set up.  Not sure what patient is requesting. There is nothing to "set up" on an Ozempic pen. She would just need to dial the pen to the recommended dose and then administer. Per chart she has been on this pen for at least a year, so nothing should have changed.

## 2022-08-04 DIAGNOSIS — H2512 Age-related nuclear cataract, left eye: Secondary | ICD-10-CM | POA: Diagnosis not present

## 2022-08-04 NOTE — Telephone Encounter (Signed)
Patient came in to office to ask for assistance with Ozempic pen. She states someone had helped her "split" her dose previously, she is not sure what dose she should be on. Per chart last rx shows Ozempic 1mg . Patient/husband cannot recall what dose she should be on. They cannot recall what dose they are clicking the pen to. They state someone previously "marked" the pen for them. They did not bring any pens with them today. Used demo pen to make sure they know how to administer medication, advised them I cannot mark a pen for them since they didn't bring any, also am not sure what would need to be marked as per chart she should be taking 1mg  dose, which would not need to be marked. They think she has been on 0.5mg  dose, possibly using the 1mg  pen at half doses to make the pen last longer. Patient aware to discuss her medication dosing with Dr. Lucianne Muss at next OV.   After patient left the office I was able to locate this note in LOV 04/12/22: She has however continued her Ozempic, dialing half of the 1 mg pen

## 2022-08-14 ENCOUNTER — Ambulatory Visit: Payer: HMO | Admitting: Family Medicine

## 2022-08-14 ENCOUNTER — Other Ambulatory Visit (INDEPENDENT_AMBULATORY_CARE_PROVIDER_SITE_OTHER): Payer: HMO | Admitting: Radiology

## 2022-08-14 ENCOUNTER — Ambulatory Visit (INDEPENDENT_AMBULATORY_CARE_PROVIDER_SITE_OTHER): Payer: HMO | Admitting: Family Medicine

## 2022-08-14 ENCOUNTER — Encounter: Payer: Self-pay | Admitting: Family Medicine

## 2022-08-14 VITALS — BP 118/78 | HR 80 | Ht 62.0 in | Wt 157.0 lb

## 2022-08-14 DIAGNOSIS — M65341 Trigger finger, right ring finger: Secondary | ICD-10-CM

## 2022-08-14 NOTE — Assessment & Plan Note (Signed)
Persistent symptoms despite treatments thus far, we discussed additional treatment strategies, plan as follows:  - Patient elected proceed with ultrasound-guided right fourth digit trigger finger cortisone injection - Post care reviewed, will restart home-based rehab - Follow-up as needed

## 2022-08-14 NOTE — Patient Instructions (Signed)
You have just been given a cortisone injection to reduce pain and inflammation. After the injection you may notice immediate relief of pain as a result of the Lidocaine. It is important to rest the area of the injection for 24 to 48 hours after the injection. There is a possibility of some temporary increased discomfort and swelling for up to 72 hours until the cortisone begins to work. If you do have pain, simply rest the joint and use ice. If you can tolerate over the counter medications, you can try Tylenol, Aleve, or Advil for added relief per package instructions. - As above, relative rest for 2 days (where trigger finger brace) then gradual return normal activity - Start home exercises afterwards - Follow-up as needed

## 2022-08-14 NOTE — Progress Notes (Signed)
     Primary Care / Sports Medicine Office Visit  Patient Information:  Patient ID: BRINESHA TERZO, female DOB: [birth year] Age: 77 y.o. MRN: [id number]   POLETH WALSON is a pleasant 77 y.o. female presenting with the following:  Chief Complaint  Patient presents with   Trigger ring finger of right hand    Vitals:   08/14/22 1437  BP: 118/78  Pulse: 80  SpO2: 98%   Vitals:   08/14/22 1437  Weight: 157 lb (71.2 kg)  Height: 5\' 2"  (1.575 m)   Body mass index is 28.72 kg/m.     Independent interpretation of notes and tests performed by another provider:   None  Procedures performed:   Procedure:  Injection of right 4th trigger finger under ultrasound guidance. Ultrasound guidance utilized for in-plane approach, peritendinous fluid noted at flexor tendon Samsung HS60 device utilized with permanent recording / reporting. Verbal informed consent obtained and verified. Skin prepped in a sterile fashion. Ethyl chloride for topical local analgesia.  Completed without difficulty and tolerated well. Medication: triamcinolone acetonide 40 mg/mL suspension for injection 0.5 mL total and 0.25 mL lidocaine 1% without epinephrine utilized for needle placement anesthetic Advised to contact for fevers/chills, erythema, induration, drainage, or persistent bleeding.   Pertinent History, Exam, Impression, and Recommendations:   Amando was seen today for trigger ring finger of right hand.  Trigger ring finger of right hand Assessment & Plan: Persistent symptoms despite treatments thus far, we discussed additional treatment strategies, plan as follows:  - Patient elected proceed with ultrasound-guided right fourth digit trigger finger cortisone injection - Post care reviewed, will restart home-based rehab - Follow-up as needed  Orders: -     [place] LIMITED JOINT SPACE STRUCTURES UP RIGHT; Future     Orders & Medications No orders of the defined types were placed in this  encounter.  Orders Placed This Encounter  Procedures   [place] LIMITED JOINT SPACE STRUCTURES UP RIGHT     No follow-ups on file.     Jerrol Banana, MD, [facility]   Primary Care Sports Medicine Primary Care and Sports Medicine at [facility]

## 2022-08-17 ENCOUNTER — Ambulatory Visit (INDEPENDENT_AMBULATORY_CARE_PROVIDER_SITE_OTHER): Payer: HMO

## 2022-08-17 VITALS — Ht 62.0 in | Wt 157.0 lb

## 2022-08-17 DIAGNOSIS — Z0000 Encounter for general adult medical examination without abnormal findings: Secondary | ICD-10-CM

## 2022-08-17 DIAGNOSIS — Z780 Asymptomatic menopausal state: Secondary | ICD-10-CM

## 2022-08-17 NOTE — Patient Instructions (Signed)
[name] , Thank you for taking time to come for your Medicare Wellness Visit. I appreciate your ongoing commitment to your health goals. Please review the following plan we discussed and let me know if I can assist you in the future.   These are the goals we discussed:  Goals      DIET - EAT MORE FRUITS AND VEGETABLES     Patient Stated     Patient would like to travel and go out more with her husband.         This is a list of the screening recommended for you and due dates:  Health Maintenance  Topic Date Due   Eye exam for diabetics  10/30/2019   COVID-19 Vaccine (5 - 2023-24 season) 12/02/2021   Mammogram  09/21/2022   Complete foot exam   10/01/2022   Hemoglobin A1C  10/11/2022   Flu Shot  11/02/2022   Yearly kidney health urinalysis for diabetes  11/23/2022   Yearly kidney function blood test for diabetes  04/13/2023   Medicare Annual Wellness Visit  08/17/2023   DTaP/Tdap/Td vaccine (2 - Td or Tdap) 01/23/2027   Pneumonia Vaccine  Completed   DEXA scan (bone density measurement)  Completed   Hepatitis C Screening: USPSTF Recommendation to screen - Ages 31-79 yo.  Completed   Zoster (Shingles) Vaccine  Completed   HPV Vaccine  Aged Out   Colon Cancer Screening  Discontinued    Advanced directives: no  Conditions/risks identified: none  Next appointment: Follow up in one year for your annual wellness visit 08/23/23 @ 9:15 am by phone   Preventive Care 65 Years and Older, Female Preventive care refers to lifestyle choices and visits with your health care provider that can promote health and wellness. What does preventive care include? A yearly physical exam. This is also called an annual well check. Dental exams once or twice a year. Routine eye exams. Ask your health care provider how often you should have your eyes checked. Personal lifestyle choices, including: Daily care of your teeth and gums. Regular physical activity. Eating a healthy diet. Avoiding  tobacco and drug use. Limiting alcohol use. Practicing safe sex. Taking low-dose aspirin every day. Taking vitamin and mineral supplements as recommended by your health care provider. What happens during an annual well check? The services and screenings done by your health care provider during your annual well check will depend on your age, overall health, lifestyle risk factors, and family history of disease. Counseling  Your health care provider may ask you questions about your: Alcohol use. Tobacco use. Drug use. Emotional well-being. Home and relationship well-being. Sexual activity. Eating habits. History of falls. Memory and ability to understand (cognition). Work and work Astronomer. Reproductive health. Screening  You may have the following tests or measurements: Height, weight, and BMI. Blood pressure. Lipid and cholesterol levels. These may be checked every 5 years, or more frequently if you are over 103 years old. Skin check. Lung cancer screening. You may have this screening every year starting at age 35 if you have a 30-pack-year history of smoking and currently smoke or have quit within the past 15 years. Fecal occult blood test (FOBT) of the stool. You may have this test every year starting at age 26. Flexible sigmoidoscopy or colonoscopy. You may have a sigmoidoscopy every 5 years or a colonoscopy every 10 years starting at age 73. Hepatitis C blood test. Hepatitis B blood test. Sexually transmitted disease (STD) testing. Diabetes screening. This  is done by checking your blood sugar (glucose) after you have not eaten for a while (fasting). You may have this done every 1-3 years. Bone density scan. This is done to screen for osteoporosis. You may have this done starting at age 38. Mammogram. This may be done every 1-2 years. Talk to your health care provider about how often you should have regular mammograms. Talk with your health care provider about your test  results, treatment options, and if necessary, the need for more tests. Vaccines  Your health care provider may recommend certain vaccines, such as: Influenza vaccine. This is recommended every year. Tetanus, diphtheria, and acellular pertussis (Tdap, Td) vaccine. You may need a Td booster every 10 years. Zoster vaccine. You may need this after age 50. Pneumococcal 13-valent conjugate (PCV13) vaccine. One dose is recommended after age 59. Pneumococcal polysaccharide (PPSV23) vaccine. One dose is recommended after age 65. Talk to your health care provider about which screenings and vaccines you need and how often you need them. This information is not intended to replace advice given to you by your health care provider. Make sure you discuss any questions you have with your health care provider. Document Released: 04/16/2015 Document Revised: 12/08/2015 Document Reviewed: 01/19/2015 Elsevier Interactive Patient Education  2017 [place] Prevention in the Home Falls can cause injuries. They can happen to people of all ages. There are many things you can do to make your home safe and to help prevent falls. What can I do on the outside of my home? Regularly fix the edges of walkways and driveways and fix any cracks. Remove anything that might make you trip as you walk through a door, such as a raised step or threshold. Trim any bushes or trees on the path to your home. Use bright outdoor lighting. Clear any walking paths of anything that might make someone trip, such as rocks or tools. Regularly check to see if handrails are loose or broken. Make sure that both sides of any steps have handrails. Any raised decks and porches should have guardrails on the edges. Have any leaves, snow, or ice cleared regularly. Use sand or salt on walking paths during winter. Clean up any spills in your garage right away. This includes oil or grease spills. What can I do in the bathroom? Use night  lights. Install grab bars by the toilet and in the tub and shower. Do not use towel bars as grab bars. Use non-skid mats or decals in the tub or shower. If you need to sit down in the shower, use a plastic, non-slip stool. Keep the floor dry. Clean up any water that spills on the floor as soon as it happens. Remove soap buildup in the tub or shower regularly. Attach bath mats securely with double-sided non-slip rug tape. Do not have throw rugs and other things on the floor that can make you trip. What can I do in the bedroom? Use night lights. Make sure that you have a light by your bed that is easy to reach. Do not use any sheets or blankets that are too big for your bed. They should not hang down onto the floor. Have a firm chair that has side arms. You can use this for support while you get dressed. Do not have throw rugs and other things on the floor that can make you trip. What can I do in the kitchen? Clean up any spills right away. Avoid walking on wet floors. Keep items that  you use a lot in easy-to-reach places. If you need to reach something above you, use a strong step stool that has a grab bar. Keep electrical cords out of the way. Do not use floor polish or wax that makes floors slippery. If you must use wax, use non-skid floor wax. Do not have throw rugs and other things on the floor that can make you trip. What can I do with my stairs? Do not leave any items on the stairs. Make sure that there are handrails on both sides of the stairs and use them. Fix handrails that are broken or loose. Make sure that handrails are as long as the stairways. Check any carpeting to make sure that it is firmly attached to the stairs. Fix any carpet that is loose or worn. Avoid having throw rugs at the top or bottom of the stairs. If you do have throw rugs, attach them to the floor with carpet tape. Make sure that you have a light switch at the top of the stairs and the bottom of the stairs. If  you do not have them, ask someone to add them for you. What else can I do to help prevent falls? Wear shoes that: Do not have high heels. Have rubber bottoms. Are comfortable and fit you well. Are closed at the toe. Do not wear sandals. If you use a stepladder: Make sure that it is fully opened. Do not climb a closed stepladder. Make sure that both sides of the stepladder are locked into place. Ask someone to hold it for you, if possible. Clearly mark and make sure that you can see: Any grab bars or handrails. First and last steps. Where the edge of each step is. Use tools that help you move around (mobility aids) if they are needed. These include: Canes. Walkers. Scooters. Crutches. Turn on the lights when you go into a dark area. Replace any light bulbs as soon as they burn out. Set up your furniture so you have a clear path. Avoid moving your furniture around. If any of your floors are uneven, fix them. If there are any pets around you, be aware of where they are. Review your medicines with your doctor. Some medicines can make you feel dizzy. This can increase your chance of falling. Ask your doctor what other things that you can do to help prevent falls. This information is not intended to replace advice given to you by your health care provider. Make sure you discuss any questions you have with your health care provider. Document Released: 01/14/2009 Document Revised: 08/26/2015 Document Reviewed: 04/24/2014 Elsevier Interactive Patient Education  2017 [employer].

## 2022-08-17 NOTE — Progress Notes (Signed)
I connected with  Melinda Ford on 08/17/22 by a audio enabled telemedicine application and verified that I am speaking with the correct person using two identifiers.  Patient Location: Home  Provider Location: Home Office  I discussed the limitations of evaluation and management by telemedicine. The patient expressed understanding and agreed to proceed.  Subjective:   Melinda Ford is a 77 y.o. female who presents for Medicare Annual (Subsequent) preventive examination.  Review of Systems     Cardiac Risk Factors include: advanced age (>69men, >65 women);diabetes mellitus;dyslipidemia;hypertension     Objective:    There were no vitals filed for this visit. There is no height or weight on file to calculate BMI.     08/17/2022   10:58 AM 03/12/2022   12:28 PM 08/15/2021   12:02 PM 08/02/2021    9:47 AM 02/10/2021    9:20 AM 02/06/2021    8:46 AM 08/11/2020   11:36 AM  Advanced Directives  Does Patient Have a Medical Advance Directive? No No Yes No Yes No Yes  Type of Surveyor, minerals;Living will    Healthcare Power of [place];Living will  Copy of Healthcare Power of Attorney in Chart?   No - copy requested    No - copy requested  Would patient like information on creating a medical advance directive? No - Patient declined   No - Patient declined       Current Medications (verified) Outpatient Encounter Medications as of 08/17/2022  Medication Sig   acetaminophen (TYLENOL) 500 MG tablet Take 1,000 mg by mouth 2 (two) times daily. As needed   amLODipine (NORVASC) 10 MG tablet Take 1 tablet by mouth once daily   Calcium Carbonate-Vit D-Min (CALCIUM 1200 PO) Take 2 each by mouth daily.   Cholecalciferol (VITAMIN D) 2000 UNITS CAPS Take 1 capsule (2,000 Units total) by mouth daily.   clotrimazole-betamethasone (LOTRISONE) cream Apply 1 Application topically daily. To feet and rash under breasts   Continuous Blood Gluc Sensor (FREESTYLE LIBRE 2  SENSOR) MISC USE AS DIRECTED   GINKGO BILOBA COMPLEX PO Take 1 capsule by mouth daily.   glucose blood (ONETOUCH VERIO) test strip USE 1 STRIP TO CHECK GLUCOSE 4 TIMES DAILY   insulin aspart (NOVOLOG) 100 UNIT/ML injection USE A MAX OF 90 UNITS DAILY VIA INSULIN PUMP   Insulin Disposable Pump (V-GO 20) 20 UNIT/24HR KIT 1  ONCE DAILY   insulin lispro (HUMALOG) 100 UNIT/ML injection USE A MAX OF 90 UNITS DAILY VIA INSULIN PUMP   Insulin Pen Needle (PEN NEEDLES) 32G X 5 MM MISC Please   losartan (COZAAR) 100 MG tablet Take 1 tablet by mouth once daily   meloxicam (MOBIC) 15 MG tablet Take 1 tablet (15 mg total) by mouth daily.   NON FORMULARY CPAP @@ 9 cm H2O nightly   omeprazole (PRILOSEC) 20 MG capsule Take 1 capsule (20 mg total) by mouth 2 (two) times daily before a meal. (Patient taking differently: Take 20 mg by mouth daily.)   pravastatin (PRAVACHOL) 20 MG tablet Take 1 tablet by mouth once daily (Patient taking differently: Take 20 mg by mouth 3 (three) times a week.)   Semaglutide, 1 MG/DOSE, (OZEMPIC, 1 MG/DOSE,) 4 MG/3ML SOPN Inject 1 mg weekly   triamcinolone cream (KENALOG) 0.5 % Apply 1 Application topically 3 (three) times daily. To rash on neck   gabapentin (NEURONTIN) 100 MG capsule Take 1-3 capsules (100-300 mg total) by mouth at bedtime. Follow written titration  schedule.   No facility-administered encounter medications on file as of 08/17/2022.    Allergies (verified) Ace inhibitors, Wheat, and Gramineae pollens   History: Past Medical History:  Diagnosis Date   Allergy    Arthritis    Cataract    Diabetes mellitus without complication (HCC)    Hyperlipidemia    Hypertension    Motion sickness    planes   OSA (obstructive sleep apnea) 08/23/2016   CPAP   Osteoporosis    Presence of dental prosthetic device    implants   Radiculopathy of lumbosacral region 06/22/2016   Right lateral epicondylitis 06/04/2017   Right sided sciatica 03/09/2016   Sleep apnea    Past  Surgical History:  Procedure Laterality Date   ABDOMINAL HYSTERECTOMY     CATARACT EXTRACTION Right 2020   COLONOSCOPY WITH PROPOFOL N/A 10/10/2019   Procedure: COLONOSCOPY WITH PROPOFOL;  Surgeon: Midge Minium, MD;  Location: [facility];  Service: Endoscopy;  Laterality: N/A;   ESOPHAGOGASTRODUODENOSCOPY (EGD) WITH PROPOFOL N/A 10/10/2019   Procedure: ESOPHAGOGASTRODUODENOSCOPY (EGD) WITH PROPOFOL;  Surgeon: Midge Minium, MD;  Location: [facility];  Service: Endoscopy;  Laterality: N/A;  Diabetic - insulin pump and oral meds sleep apnea   POLYPECTOMY  10/10/2019   Procedure: POLYPECTOMY;  Surgeon: Midge Minium, MD;  Location: [facility];  Service: Endoscopy;;   Family History  Problem Relation Age of Onset   Breast cancer Other 40   Alcohol abuse Father    Mental illness Father    Stroke Father    Diabetes Sister    Asthma Brother    Diabetes Brother    Diabetes Maternal Aunt    Heart disease Maternal Aunt    Diabetes Maternal Uncle    Early death Maternal Uncle    Breast cancer Cousin 14       mat cousin   Social History   Socioeconomic History   Marital status: Married    Spouse name: Ajane Escutia   Number of children: 1   Years of education: 20   Highest education level: Doctorate  Occupational History   Not on file  Tobacco Use   Smoking status: Never   Smokeless tobacco: Never  Vaping Use   Vaping Use: Never used  Substance and Sexual Activity   Alcohol use: Not Currently    Alcohol/week: 1.0 standard drink of alcohol    Types: 1 Standard drinks or equivalent per week   Drug use: Never   Sexual activity: Not Currently    Partners: Male  Other Topics Concern   Not on file  Social History Narrative   Not on file   Social Determinants of Health   Financial Resource Strain: Low Risk  (08/17/2022)   Overall Financial Resource Strain (CARDIA)    Difficulty of Paying Living Expenses: Not hard at all  Food Insecurity: No Food Insecurity  (08/17/2022)   Hunger Vital Sign    Worried About Running Out of Food in the Last Year: Never true    Ran Out of Food in the Last Year: Never true  Transportation Needs: No Transportation Needs (08/17/2022)   PRAPARE - Administrator, Civil Service (Medical): No    Lack of Transportation (Non-Medical): No  Physical Activity: Insufficiently Active (08/17/2022)   Exercise Vital Sign    Days of Exercise per Week: 2 days    Minutes of Exercise per Session: 30 min  Stress: No Stress Concern Present (08/17/2022)   [employer] of Occupational Health -  Occupational Stress Questionnaire    Feeling of Stress : Only a little  Social Connections: Socially Integrated (08/17/2022)   Social Connection and Isolation Panel [NHANES]    Frequency of Communication with Friends and Family: More than three times a week    Frequency of Social Gatherings with Friends and Family: Never    Attends Religious Services: More than 4 times per year    Active Member of [employer] or Organizations: Yes    Attends Engineer, structural: More than 4 times per year    Marital Status: Married    Tobacco Counseling Counseling given: Not Answered   Clinical Intake:  Pre-visit preparation completed: Yes  Pain : No/denies pain     Nutritional Risks: None Diabetes: Yes CBG done?: No Did pt. bring in CBG monitor from home?: No  How often do you need to have someone help you when you read instructions, pamphlets, or other written materials from your doctor or pharmacy?: 1 - Never  Diabetic?yes Nutrition Risk Assessment:  Has the patient had any N/V/D within the last 2 months?  No  Does the patient have any non-healing wounds?  No  Has the patient had any unintentional weight loss or weight gain?  No   Diabetes:  Is the patient diabetic?  Yes  If diabetic, was a CBG obtained today?  No  Did the patient bring in their glucometer from home?  No  How often do you monitor your CBG's? 2-3x/day.    Financial Strains and Diabetes Management:  Are you having any financial strains with the device, your supplies or your medication? No .  Does the patient want to be seen by Chronic Care Management for management of their diabetes?  No  Would the patient like to be referred to a Nutritionist or for Diabetic Management?  No   Diabetic Exams:  Diabetic Eye Exam: Completed 10/30/18. Overdue for diabetic eye exam. Pt has been advised about the importance in completing this exam.  Diabetic Foot Exam: Completed 09/30/21. Pt has been advised about the importance in completing this exam.     Interpreter Needed?: No  Information entered by :: Kennedy Bucker, LPN   Activities of Daily Living    08/17/2022   10:59 AM  In your present state of health, do you have any difficulty performing the following activities:  Hearing? 0  Vision? 0  Difficulty concentrating or making decisions? 0  Walking or climbing stairs? 1  Dressing or bathing? 0  Doing errands, shopping? 0  Preparing Food and eating ? N  Using the Toilet? N  In the past six months, have you accidently leaked urine? N  Do you have problems with loss of bowel control? N  Managing your Medications? N  Managing your Finances? N  Housekeeping or managing your Housekeeping? N    Patient Care Team: Reubin Milan, MD as PCP - General (Internal Medicine) Reather Littler, MD as Consulting Physician (Endocrinology) Gwyneth Revels, DPM as Referring Physician (Podiatry) Vernie Murders, MD (Otolaryngology) Alwyn Pea, MD as Consulting Physician (Cardiology) [facility] (Ophthalmology)  Indicate any recent Medical Services you may have received from other than [facility] providers in the past year (date may be approximate).     Assessment:   This is a routine wellness examination for Melinda Ford.  Hearing/Vision screen Hearing Screening - Comments:: No aids Vision Screening - Comments:: Wears readers- Dr.Bell  Dietary issues and  exercise activities discussed: Current Exercise Habits: Home exercise routine, Type of exercise:  walking, Time (Minutes): 30, Frequency (Times/Week): 2, Weekly Exercise (Minutes/Week): 60, Intensity: Mild   Goals Addressed             This Visit's Progress    DIET - EAT MORE FRUITS AND VEGETABLES         Depression Screen    08/17/2022   10:56 AM 07/28/2022    9:24 AM 05/04/2022    4:44 PM 04/05/2022    1:49 PM 03/13/2022   11:41 AM 12/26/2021    9:00 AM 09/20/2021    2:42 PM  PHQ 2/9 Scores  PHQ - 2 Score 0 0 0 0 0 0 0  PHQ- 9 Score 0 6 0 1 1 2 3     Fall Risk    08/17/2022   10:58 AM 07/28/2022    9:24 AM 05/04/2022    4:44 PM 04/05/2022    1:49 PM 03/13/2022   11:41 AM  Fall Risk   Falls in the past year? 1 0 0 1 1  Number falls in past yr: 0 0 0 1 1  Injury with Fall? 0 0 0 0 0  Risk for fall due to : History of fall(s) No Fall Risks No Fall Risks History of fall(s) No Fall Risks  Follow up Falls prevention discussed;Falls evaluation completed Falls evaluation completed Falls evaluation completed;Falls prevention discussed Falls evaluation completed Falls evaluation completed    FALL RISK PREVENTION PERTAINING TO THE HOME:  Any stairs in or around the home? Yes  If so, are there any without handrails? No  Home free of loose throw rugs in walkways, pet beds, electrical cords, etc? Yes  Adequate lighting in your home to reduce risk of falls? Yes   ASSISTIVE DEVICES UTILIZED TO PREVENT FALLS:  Life alert? No  Use of a cane, walker or w/c? No  Grab bars in the bathroom? Yes  Shower chair or bench in shower? No  Elevated toilet seat or a handicapped toilet? No    Cognitive Function:    07/26/2015    5:00 PM  MMSE - Mini Mental State Exam  Orientation to time 5  Orientation to Place 4  Registration 3  Attention/ Calculation 5  Recall 3  Language- name 2 objects 2  Language- repeat 1  Language- follow 3 step command 3  Language- read & follow direction 1  Write  a sentence 1  Copy design 1  Total score 29        08/17/2022   11:04 AM 08/11/2020   11:42 AM 09/12/2019    2:40 PM 05/15/2018    9:39 AM 11/20/2016    2:03 PM  6CIT Screen  What Year? 0 points 0 points 0 points 0 points 0 points  What month? 0 points 0 points 0 points 0 points 0 points  What time? 0 points 0 points 0 points 0 points 0 points  Count back from 20 2 points 0 points 0 points 0 points 0 points  Months in reverse 0 points 0 points 0 points 0 points 0 points  Repeat phrase 0 points 0 points 2 points 0 points 0 points  Total Score 2 points 0 points 2 points 0 points 0 points    Immunizations Immunization History  Administered Date(s) Administered   Fluad Quad(high Dose 65+) 12/09/2019, 12/28/2020   Influenza, High Dose Seasonal PF 01/25/2016, 01/22/2017, 01/18/2018   Influenza,inj,Quad PF,6+ Mos 02/24/2014, 01/15/2015   Influenza-Unspecified 01/09/2011, 12/15/2011   Moderna Sars-Covid-2 Vaccination 05/09/2019, 06/06/2019, 02/10/2020, 03/25/2021   Pneumococcal  Conjugate-13 04/16/2014   Pneumococcal Polysaccharide-23 05/06/2010   Tdap 01/22/2017   Zoster Recombinat (Shingrix) 01/18/2018, 11/12/2018   Zoster, Live 10/25/2010    TDAP status: Up to date  Flu Vaccine status: Declined, Education has been provided regarding the importance of this vaccine but patient still declined. Advised may receive this vaccine at local pharmacy or Health Dept. Aware to provide a copy of the vaccination record if obtained from local pharmacy or Health Dept. Verbalized acceptance and understanding.  Pneumococcal vaccine status: Up to date  Covid-19 vaccine status: Completed vaccines  Qualifies for Shingles Vaccine? Yes   Zostavax completed Yes   Shingrix Completed?: Yes  Screening Tests Health Maintenance  Topic Date Due   OPHTHALMOLOGY EXAM  10/30/2019   COVID-19 Vaccine (5 - 2023-24 season) 12/02/2021   MAMMOGRAM  09/21/2022   FOOT EXAM  10/01/2022   HEMOGLOBIN A1C   10/11/2022   INFLUENZA VACCINE  11/02/2022   Diabetic kidney evaluation - Urine ACR  11/23/2022   Diabetic kidney evaluation - eGFR measurement  04/13/2023   Medicare Annual Wellness (AWV)  08/17/2023   DTaP/Tdap/Td (2 - Td or Tdap) 01/23/2027   Pneumonia Vaccine 22+ Years old  Completed   DEXA SCAN  Completed   Hepatitis C Screening  Completed   Zoster Vaccines- Shingrix  Completed   HPV VACCINES  Aged Out   COLONOSCOPY (Pts 45-54yrs Insurance coverage will need to be confirmed)  Discontinued    Health Maintenance  Health Maintenance Due  Topic Date Due   OPHTHALMOLOGY EXAM  10/30/2019   COVID-19 Vaccine (5 - 2023-24 season) 12/02/2021    Colorectal cancer screening: No longer required.   Mammogram status: No longer required due to age.  Bone Density status: Completed 10/07/19. Results reflect: Bone density results: OSTEOPOROSIS. Repeat every 2 years.  Lung Cancer Screening: (Low Dose CT Chest recommended if Age 87-80 years, 30 pack-year currently smoking OR have quit w/in 15years.) does not qualify.   Additional Screening:  Hepatitis C Screening: does qualify; Completed 04/16/14  Vision Screening: Recommended annual ophthalmology exams for early detection of glaucoma and other disorders of the eye. Is the patient up to date with their annual eye exam?  Yes  Who is the provider or what is the name of the office in which the patient attends annual eye exams? Dr.Bell If pt is not established with a provider, would they like to be referred to a provider to establish care? No .   Dental Screening: Recommended annual dental exams for proper oral hygiene  Community Resource Referral / Chronic Care Management: CRR required this visit?  No   CCM required this visit?  No      Plan:     I have personally reviewed and noted the following in the patient's chart:   Medical and social history Use of alcohol, tobacco or illicit drugs  Current medications and supplements  including opioid prescriptions. Patient is not currently taking opioid prescriptions. Functional ability and status Nutritional status Physical activity Advanced directives List of other physicians Hospitalizations, surgeries, and ER visits in previous 12 months Vitals Screenings to include cognitive, depression, and falls Referrals and appointments  In addition, I have reviewed and discussed with patient certain preventive protocols, quality metrics, and best practice recommendations. A written personalized care plan for preventive services as well as general preventive health recommendations were provided to patient.     Hal Hope, LPN   1/61/0960   Nurse Notes: pt wants to have neuropsych testing d/t  word recollection and memory issues

## 2022-08-28 NOTE — Progress Notes (Unsigned)
Patient ID: Melinda Ford, female   DOB: Jan 12, 1946, 77 y.o.   MRN: [id number]            Reason for Appointment: Follow-up for Type 2 Diabetes   History of Present Illness:          Date of diagnosis of type 2 diabetes mellitus: ?  2004        Background history:   She previously had been on metformin and also Avandia initially Detailed records of her care are not available prior to 2013 or so She was tried on Januvia in 2016 but this was not effective and this was stopped in 08/2015 She was started on insulin in 2/16 with small doses of Lantus insulin Her A1c has been consistently over 8% since about 2013  Recent history:   INSULIN regimen is:   V-go 20 unit pump since 12/23/15, boluses 0-4 clicks with meals     Non-insulin hypoglycemic drugs : Ozempic 0.5 weekly  Her A1c is same at 8%   Current management, blood sugar patterns and problems identified: Although she was doing fairly well in August with monitoring and somewhat better compliance with insulin boluses she has not followed any instructions lately Previously her time in range on the sensor was 72% and now only 44% However she has checked her sugars only very sporadically with only 36% active CGM time This indicates significant HYPERGLYCEMIA after her evening meal at least and likely also whenever she has documented breakfast or lunch meals  She is likely bolusing only when the freestyle Josephine Igo is alarming for high sugars and she forgets to bolus Occasionally with bolusing late she will have a low blood sugar around bedtime  Overnight blood sugars are also variable and difficult to assess  She has however continued her Ozempic, dialing half of the 1 mg pen        Side effects from medications have been: nausea and diarrhea from 1000 MG metformin  Compliance with the medical regimen: fair  Previous CGM data: . CGM use % of time 36  2-week average/GV   Time in range        44%  % Time Above 180 21+21  % Time  above 250   % Time Below 70      PRE-MEAL Fasting Lunch Dinner Bedtime Overall  Glucose range:       Averages: ?  173     POST-MEAL PC Breakfast PC Lunch PC Dinner  Glucose range:   254  Averages:      Prior   CGM use % of time   2-week average/GV 151/32  Time in range      72%  % Time Above 180 23+3  % Time above 250   % Time Below 70 2     PRE-MEAL Fasting Lunch Dinner Bedtime Overall  Glucose range:       Averages: 111 168 142     POST-MEAL PC Breakfast PC Lunch PC Dinner  Glucose range:     Averages: 168 178 185    Self-care:   Meal times are:  Breakfast is at 10 AM, dinner 8, lunch 2-3 pm  Typical meal intake: Breakfast is toast, oatmeal, sometimes bacon, milk   Lunch: Sometimes hot dogs, fish sandwich Evening meal is a protein like chicken, steak, pork chop, corn, greens.           Dietician visit, most recent: 04/2021  CDE visit: 12/2015   Weight history:  Wt Readings  from Last 3 Encounters:  08/17/22 157 lb (71.2 kg)  08/14/22 157 lb (71.2 kg)  07/28/22 157 lb (71.2 kg)    Glycemic control:   Lab Results  Component Value Date   HGBA1C 8.0 (A) 04/12/2022   HGBA1C 7.9 (A) 11/22/2021   HGBA1C 8.0 (A) 08/19/2021   Lab Results  Component Value Date   MICROALBUR 1.1 11/22/2021   LDLCALC 97 04/12/2022   CREATININE 0.70 04/12/2022   Lab Results  Component Value Date   MICRALBCREAT 1.4 11/22/2021    Other active problems: See review of systems   Allergies as of 08/29/2022       Reactions   Ace Inhibitors Cough   Wheat Hives   Gramineae Pollens         Medication List        Accurate as of Aug 28, 2022  9:03 PM. If you have any questions, ask your nurse or doctor.          acetaminophen 500 MG tablet Commonly known as: TYLENOL Take 1,000 mg by mouth 2 (two) times daily. As needed   amLODipine 10 MG tablet Commonly known as: NORVASC Take 1 tablet by mouth once daily   CALCIUM 1200 PO Take 2 each by mouth daily.    clotrimazole-betamethasone cream Commonly known as: LOTRISONE Apply 1 Application topically daily. To feet and rash under breasts   FreeStyle Libre 2 Sensor Misc USE AS DIRECTED   gabapentin 100 MG capsule Commonly known as: Neurontin Take 1-3 capsules (100-300 mg total) by mouth at bedtime. Follow written titration schedule.   GINKGO BILOBA COMPLEX PO Take 1 capsule by mouth daily.   insulin aspart 100 UNIT/ML injection Commonly known as: novoLOG USE A MAX OF 90 UNITS DAILY VIA INSULIN PUMP   insulin lispro 100 UNIT/ML injection Commonly known as: HUMALOG USE A MAX OF 90 UNITS DAILY VIA INSULIN PUMP   losartan 100 MG tablet Commonly known as: COZAAR Take 1 tablet by mouth once daily   meloxicam 15 MG tablet Commonly known as: MOBIC Take 1 tablet (15 mg total) by mouth daily.   NON FORMULARY CPAP @@ 9 cm H2O nightly   omeprazole 20 MG capsule Commonly known as: PRILOSEC Take 1 capsule (20 mg total) by mouth 2 (two) times daily before a meal. What changed: when to take this   OneTouch Verio test strip Generic drug: glucose blood USE 1 STRIP TO CHECK GLUCOSE 4 TIMES DAILY   Ozempic (1 MG/DOSE) 4 MG/3ML Sopn Generic drug: Semaglutide (1 MG/DOSE) Inject 1 mg weekly   Pen Needles 32G X 5 MM Misc Please   pravastatin 20 MG tablet Commonly known as: PRAVACHOL Take 1 tablet by mouth once daily What changed: when to take this   triamcinolone cream 0.5 % Commonly known as: KENALOG Apply 1 Application topically 3 (three) times daily. To rash on neck   V-Go 20 20 UNIT/24HR Kit 1  ONCE DAILY   Vitamin D 50 MCG (2000 UT) Caps Take 1 capsule (2,000 Units total) by mouth daily.        Allergies:  Allergies  Allergen Reactions   Ace Inhibitors Cough   Wheat Hives   Gramineae Pollens     Past Medical History:  Diagnosis Date   Allergy    Arthritis    Cataract    Diabetes mellitus without complication (HCC)    Hyperlipidemia    Hypertension    Motion  sickness    planes   OSA (obstructive sleep  apnea) 08/23/2016   CPAP   Osteoporosis    Presence of dental prosthetic device    implants   Radiculopathy of lumbosacral region 06/22/2016   Right lateral epicondylitis 06/04/2017   Right sided sciatica 03/09/2016   Sleep apnea     Past Surgical History:  Procedure Laterality Date   ABDOMINAL HYSTERECTOMY     CATARACT EXTRACTION Right 2020   COLONOSCOPY WITH PROPOFOL N/A 10/10/2019   Procedure: COLONOSCOPY WITH PROPOFOL;  Surgeon: Midge Minium, MD;  Location: [facility];  Service: Endoscopy;  Laterality: N/A;   ESOPHAGOGASTRODUODENOSCOPY (EGD) WITH PROPOFOL N/A 10/10/2019   Procedure: ESOPHAGOGASTRODUODENOSCOPY (EGD) WITH PROPOFOL;  Surgeon: Midge Minium, MD;  Location: [facility];  Service: Endoscopy;  Laterality: N/A;  Diabetic - insulin pump and oral meds sleep apnea   POLYPECTOMY  10/10/2019   Procedure: POLYPECTOMY;  Surgeon: Midge Minium, MD;  Location: [facility];  Service: Endoscopy;;    Family History  Problem Relation Age of Onset   Breast cancer Other 40   Alcohol abuse Father    Mental illness Father    Stroke Father    Diabetes Sister    Asthma Brother    Diabetes Brother    Diabetes Maternal Aunt    Heart disease Maternal Aunt    Diabetes Maternal Uncle    Early death Maternal Uncle    Breast cancer Cousin 67       mat cousin    Social History:  reports that she has never smoked. She has never used smokeless tobacco. She reports that she does not currently use alcohol after a past usage of about 1.0 standard drink of alcohol per week. She reports that she does not use drugs.   Review of Systems  Lipid history: Treated with pravastatin 3 times a week Although previously she had tolerated this well she is asking not taking pravastatin possibly causing muscle cramps, previously had concerned about with muscle aches Has not discussed with PCP  Last LDL:  Lab Results  Component Value Date    CHOL 199 04/12/2022   CHOL 203 (H) 08/19/2021   CHOL 211 (H) 10/01/2020   Lab Results  Component Value Date   HDL 84.50 04/12/2022   HDL 90.20 08/19/2021   HDL 87.00 10/01/2020   Lab Results  Component Value Date   LDLCALC 97 04/12/2022   LDLCALC 99 08/19/2021   LDLCALC 111 (H) 10/01/2020   Lab Results  Component Value Date   TRIG 90.0 04/12/2022   TRIG 69.0 08/19/2021   TRIG 63.0 10/01/2020   Lab Results  Component Value Date   CHOLHDL 2 04/12/2022   CHOLHDL 2 08/19/2021   CHOLHDL 2 10/01/2020   No results found for: "LDLDIRECT"          Hypertension: on treatment for several years, on amlodipine and losartan   Has been followed by PCP  BP Readings from Last 3 Encounters:  08/14/22 118/78  07/28/22 110/78  07/19/22 128/78     Most recent foot exam: 12/2019  OSTEOPOROSIS: Followed by PCP, lowest T score was- 2.6 at the spine compared to -3.0 before She was previously taking alendronate Also has had longstanding vitamin D deficiency  Has been taking 4000 units of vitamin D3   Lab Results  Component Value Date   VD25OH 34.0 12/28/2020   VD25OH 40.70 02/17/2020   VD25OH 28.44 (L) 10/22/2019   VD25OH 29.1 (L) 04/20/2017   VD25OH 24.92 (L) 05/18/2016   VD25OH 31.3 07/28/2015   VD25OH  26.9 (L) 03/17/2015   VD25OH 21.5 (L) 09/17/2014      Physical Examination:  There were no vitals taken for this visit.       ASSESSMENT:  Diabetes type 2 on insulin  See history of present illness for detailed discussion of current diabetes management, blood sugar patterns and problems identified  She is currently on insulin with the V-go pump and weekly Ozempic 0.5  A1c is around the same at 8  She has significant issues with compliance with monitoring, will bolusing before meals and likely variability in the diet Also unable to exercise much  HYPERTENSION: Has good control with current regimen   PLAN:   She was reminded to bolus before starting to eat  and discussed that she is getting into significant hyperglycemia and high alerts with not bolusing before eating She may be able to have her husband reminded to bolus Also likely can benefit from using the Dexcom instead of the [place] 2 sensor because of lack of adequate monitoring Discussed in detail the differences between Dexcom and freestyle libre version 2 Likely can get the reader as a sample for the Dexcom and we will start this in the office as a sample Likely will need to bolus for clicks but this can be adjusted based on meal size at various meals She will continue Ozempic  She can try to leave off her pravastatin for couple of weeks to see if her cramping is better, to have labs checked today   There are no Patient Instructions on file for this visit.     Reather Littler 08/28/2022, 9:03 PM   Note: This office note was prepared with Dragon voice recognition system technology. Any transcriptional errors that result from this process are unintentional.  Total visit time including counseling = 30 minutes

## 2022-08-29 ENCOUNTER — Encounter: Payer: Self-pay | Admitting: Endocrinology

## 2022-08-29 ENCOUNTER — Ambulatory Visit (INDEPENDENT_AMBULATORY_CARE_PROVIDER_SITE_OTHER): Payer: PPO | Admitting: Endocrinology

## 2022-08-29 VITALS — BP 136/70 | HR 55 | Ht 62.0 in | Wt 157.6 lb

## 2022-08-29 DIAGNOSIS — R252 Cramp and spasm: Secondary | ICD-10-CM | POA: Diagnosis not present

## 2022-08-29 DIAGNOSIS — Z794 Long term (current) use of insulin: Secondary | ICD-10-CM

## 2022-08-29 DIAGNOSIS — I10 Essential (primary) hypertension: Secondary | ICD-10-CM

## 2022-08-29 DIAGNOSIS — E1165 Type 2 diabetes mellitus with hyperglycemia: Secondary | ICD-10-CM | POA: Diagnosis not present

## 2022-08-29 LAB — POCT GLYCOSYLATED HEMOGLOBIN (HGB A1C): Hemoglobin A1C: 7.6 % — AB (ref 4.0–5.6)

## 2022-08-29 MED ORDER — DEXCOM G7 SENSOR MISC: 1.0000 | 3 refills | Status: DC

## 2022-08-29 NOTE — Patient Instructions (Addendum)
Bolus before each meal  After meal sugar maximum 200  Dr Gelene Mink

## 2022-08-30 ENCOUNTER — Encounter: Payer: Self-pay | Admitting: Endocrinology

## 2022-08-31 ENCOUNTER — Ambulatory Visit: Payer: HMO | Admitting: Endocrinology

## 2022-09-05 LAB — HM DIABETES EYE EXAM

## 2022-09-06 DIAGNOSIS — E1142 Type 2 diabetes mellitus with diabetic polyneuropathy: Secondary | ICD-10-CM | POA: Diagnosis not present

## 2022-09-06 DIAGNOSIS — M79674 Pain in right toe(s): Secondary | ICD-10-CM | POA: Diagnosis not present

## 2022-09-06 DIAGNOSIS — B351 Tinea unguium: Secondary | ICD-10-CM | POA: Diagnosis not present

## 2022-09-06 DIAGNOSIS — M79675 Pain in left toe(s): Secondary | ICD-10-CM | POA: Diagnosis not present

## 2022-09-19 ENCOUNTER — Other Ambulatory Visit: Payer: Self-pay | Admitting: Internal Medicine

## 2022-09-19 DIAGNOSIS — Z1231 Encounter for screening mammogram for malignant neoplasm of breast: Secondary | ICD-10-CM

## 2022-09-20 ENCOUNTER — Telehealth: Payer: Self-pay

## 2022-09-20 NOTE — Telephone Encounter (Signed)
Melinda Ford Ann Linford Quintela, CMA  ?

## 2022-09-25 ENCOUNTER — Other Ambulatory Visit: Payer: Self-pay | Admitting: Internal Medicine

## 2022-09-25 DIAGNOSIS — I10 Essential (primary) hypertension: Secondary | ICD-10-CM

## 2022-10-09 ENCOUNTER — Ambulatory Visit (INDEPENDENT_AMBULATORY_CARE_PROVIDER_SITE_OTHER): Payer: HMO | Admitting: Internal Medicine

## 2022-10-09 ENCOUNTER — Encounter: Payer: Self-pay | Admitting: Internal Medicine

## 2022-10-09 DIAGNOSIS — B353 Tinea pedis: Secondary | ICD-10-CM | POA: Diagnosis not present

## 2022-10-09 DIAGNOSIS — R21 Rash and other nonspecific skin eruption: Secondary | ICD-10-CM

## 2022-10-09 MED ORDER — CLOTRIMAZOLE-BETAMETHASONE 1-0.05 % EX CREA: 1.0000 | TOPICAL_CREAM | Freq: Every day | CUTANEOUS | 3 refills | Status: AC

## 2022-10-09 NOTE — Patient Instructions (Signed)
Dr. Verdis Frederickson - endo at [facility] in [place]

## 2022-10-09 NOTE — Progress Notes (Signed)
Date:  10/09/2022   Name:  Melinda Ford   DOB:  Oct 29, 1945   MRN:  [id number]   Chief Complaint: Rash (Spots on left shoulder and both breasts and flanks. Itchy. And only slightly painful. Unsure of cause. Thursday morning patient was outside in a path showing her old place of home to family. After this patient went to a wedding reception.) and dry feet (Both feet have peeling skin. Taking Lotrisone cream as prescribed but not getting better for feet.)  Rash This is a new problem. The current episode started in the past 7 days. The problem is unchanged. The affected locations include the right upper leg, left shoulder and chest. The rash is characterized by itchiness and redness. Pertinent negatives include no diarrhea, fatigue, fever or shortness of breath.  Also ongoing peeling skin on both feet.  Tried Nystatin and then Lotrisone.  Symptoms unchanged.  Lab Results  Component Value Date   NA 140 04/12/2022   K 4.1 04/12/2022   CO2 28 04/12/2022   GLUCOSE 105 (H) 04/12/2022   BUN 19 04/12/2022   CREATININE 0.70 04/12/2022   CALCIUM 9.8 04/12/2022   EGFR 82 07/01/2021   GFRNONAA 98 09/10/2015   Lab Results  Component Value Date   CHOL 199 04/12/2022   HDL 84.50 04/12/2022   LDLCALC 97 04/12/2022   TRIG 90.0 04/12/2022   CHOLHDL 2 04/12/2022   Lab Results  Component Value Date   TSH 1.650 12/28/2020   Lab Results  Component Value Date   HGBA1C 7.6 (A) 08/29/2022   Lab Results  Component Value Date   WBC 5.2 07/01/2021   HGB 14.5 07/01/2021   HCT 44.4 07/01/2021   MCV 93 07/01/2021   PLT 284 07/01/2021   Lab Results  Component Value Date   ALT 63 (H) 04/12/2022   AST 49 (H) 04/12/2022   ALKPHOS 87 04/12/2022   BILITOT 0.4 04/12/2022   Lab Results  Component Value Date   VD25OH 34.0 12/28/2020     Review of Systems  Constitutional:  Negative for chills, fatigue and fever.  HENT:  Negative for nosebleeds.   Eyes:  Negative for visual disturbance.   Respiratory:  Negative for chest tightness and shortness of breath.   Cardiovascular:  Negative for chest pain, palpitations and leg swelling.  Gastrointestinal:  Negative for abdominal pain, constipation and diarrhea.  Musculoskeletal:  Negative for arthralgias and gait problem.  Skin:  Positive for rash.  Neurological:  Negative for dizziness, weakness, light-headedness and headaches.  Psychiatric/Behavioral:  Negative for sleep disturbance. The patient is not nervous/anxious.     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   Cervical paraspinal muscle spasm 02/10/2021   Gastroesophageal reflux disease 12/28/2020   Personal history of colonic polyps    Polyp of sigmoid colon    Abdominal pain, epigastric    Patient noncompliant with statin medication 09/12/2019   Type II diabetes mellitus with complication (HCC) 06/04/2019   Hyperlipidemia associated with type 2 diabetes mellitus (HCC) 05/17/2018   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 02/22/2018   Chronic midline thoracic back pain 02/22/2018   Tinea versicolor 09/20/2016   OSA (obstructive sleep apnea) 08/23/2016   Allergic rhinitis 07/13/2016  Overweight (BMI 25.0-29.9) 10/01/2015   Vitamin D deficiency 09/18/2014   Onychomycosis 09/16/2014   Degenerative disc disease, lumbar 09/16/2014   Benign essential HTN 10/10/2010   Osteoporosis 10/10/2010    Allergies  Allergen Reactions   Ace Inhibitors Cough   Wheat Hives   Gramineae Pollens     Past Surgical History:  Procedure Laterality Date   ABDOMINAL HYSTERECTOMY     CATARACT EXTRACTION Right 2020   COLONOSCOPY WITH PROPOFOL N/A 10/10/2019   Procedure: COLONOSCOPY WITH  PROPOFOL;  Surgeon: Midge Minium, MD;  Location: [facility];  Service: Endoscopy;  Laterality: N/A;   ESOPHAGOGASTRODUODENOSCOPY (EGD) WITH PROPOFOL N/A 10/10/2019   Procedure: ESOPHAGOGASTRODUODENOSCOPY (EGD) WITH PROPOFOL;  Surgeon: Midge Minium, MD;  Location: [facility];  Service: Endoscopy;  Laterality: N/A;  Diabetic - insulin pump and oral meds sleep apnea   POLYPECTOMY  10/10/2019   Procedure: POLYPECTOMY;  Surgeon: Midge Minium, MD;  Location: [facility];  Service: Endoscopy;;    Social History   Tobacco Use   Smoking status: Never   Smokeless tobacco: Never  Vaping Use   Vaping Use: Never used  Substance Use Topics   Alcohol use: Not Currently    Alcohol/week: 1.0 standard drink of alcohol    Types: 1 Standard drinks or equivalent per week   Drug use: Never     Medication list has been reviewed and updated.  Current Meds  Medication Sig   acetaminophen (TYLENOL) 500 MG tablet Take 1,000 mg by mouth 2 (two) times daily. As needed   amLODipine (NORVASC) 10 MG tablet Take 1 tablet by mouth once daily   Calcium Carbonate-Vit D-Min (CALCIUM 1200 PO) Take 2 each by mouth daily.   Cholecalciferol (VITAMIN D) 2000 UNITS CAPS Take 1 capsule (2,000 Units total) by mouth daily.   Continuous Blood Gluc Sensor (FREESTYLE LIBRE 2 SENSOR) MISC USE AS DIRECTED   Continuous Glucose Sensor (DEXCOM G7 SENSOR) MISC 1 Device by Does not apply route as directed. Change sensor every 10 days   gabapentin (NEURONTIN) 100 MG capsule Take 1-3 capsules (100-300 mg total) by mouth at bedtime. Follow written titration schedule.   GINKGO BILOBA COMPLEX PO Take 1 capsule by mouth daily.   glucose blood (ONETOUCH VERIO) test strip USE 1 STRIP TO CHECK GLUCOSE 4 TIMES DAILY   insulin aspart (NOVOLOG) 100 UNIT/ML injection USE A MAX OF 90 UNITS DAILY VIA INSULIN PUMP   Insulin Disposable Pump (V-GO 20) 20 UNIT/24HR KIT 1  ONCE DAILY   Insulin Pen Needle (PEN NEEDLES) 32G X 5 MM  MISC Please   losartan (COZAAR) 100 MG tablet Take 1 tablet by mouth once daily   meloxicam (MOBIC) 15 MG tablet Take 1 tablet (15 mg total) by mouth daily.   NON FORMULARY CPAP @@ 9 cm H2O nightly   nystatin cream (MYCOSTATIN) Apply 1 Application topically 2 (two) times daily.   omeprazole (PRILOSEC) 20 MG capsule Take 1 capsule (20 mg total) by mouth 2 (two) times daily before a meal. (Patient taking differently: Take 20 mg by mouth daily.)   pravastatin (PRAVACHOL) 20 MG tablet Take 1 tablet by mouth once daily (Patient taking differently: Take 20 mg by mouth 3 (three) times a week.)   Semaglutide, 1 MG/DOSE, (OZEMPIC, 1 MG/DOSE,) 4 MG/3ML SOPN Inject 1 mg weekly   triamcinolone cream (KENALOG) 0.5 % Apply 1 Application topically 3 (three) times daily. To rash on neck   [DISCONTINUED] clotrimazole-betamethasone (LOTRISONE) cream Apply 1 Application topically daily. To  feet and rash under breasts   [DISCONTINUED] insulin lispro (HUMALOG) 100 UNIT/ML injection USE A MAX OF 90 UNITS DAILY VIA INSULIN PUMP       10/09/2022    9:15 AM 07/28/2022    9:24 AM 05/04/2022    4:44 PM 04/05/2022    1:49 PM  GAD 7 : Generalized Anxiety Score  Nervous, Anxious, on Edge 1 0 0 0  Control/stop worrying 0 1 0 0  Worry too much - different things 0 1 0 1  Trouble relaxing 0 0 0 0  Restless 0 0 0 0  Easily annoyed or irritable 0 0 0 0  Afraid - awful might happen 0 0 0 0  Total GAD 7 Score 1 2 0 1  Anxiety Difficulty Not difficult at all Not difficult at all Not difficult at all Not difficult at all       10/09/2022    9:15 AM 08/17/2022   10:56 AM 07/28/2022    9:24 AM  Depression screen PHQ 2/9  Decreased Interest 0 0 0  Down, Depressed, Hopeless 0 0 0  PHQ - 2 Score 0 0 0  Altered sleeping 0 0 0  Tired, decreased energy 1 0 2  Change in appetite 1 0 2  Feeling bad or failure about yourself  1 0 1  Trouble concentrating 0 0 0  Moving slowly or fidgety/restless 1 0 1  Suicidal thoughts 0 0 0   PHQ-9 Score 4 0 6  Difficult doing work/chores Not difficult at all Not difficult at all Not difficult at all    BP Readings from Last 3 Encounters:  10/09/22 124/62  08/29/22 136/70  08/14/22 118/78    Physical Exam Vitals and nursing note reviewed.  Constitutional:      General: She is not in acute distress.    Appearance: Normal appearance. She is well-developed.  HENT:     Head: Normocephalic and atraumatic.  Cardiovascular:     Rate and Rhythm: Normal rate and regular rhythm.  Pulmonary:     Effort: Pulmonary effort is normal. No respiratory distress.     Breath sounds: No wheezing or rhonchi.  Musculoskeletal:     Cervical back: Normal range of motion.     Right lower leg: No edema.     Left lower leg: No edema.  Skin:    General: Skin is warm and dry.     Findings: Rash (scattered red macules on shoulders and breast c/w insect bites) present.  Neurological:     Mental Status: She is alert and oriented to person, place, and time.  Psychiatric:        Mood and Affect: Mood normal.        Behavior: Behavior normal.    Diabetic Foot Exam - Simple   Simple Foot Form Diabetic Foot exam was performed with the following findings: Yes 10/09/2022  9:34 AM  Visual Inspection No deformities, no ulcerations, no other skin breakdown bilaterally: Yes Sensation Testing Intact to touch and monofilament testing bilaterally: Yes Pulse Check Posterior Tibialis and Dorsalis pulse intact bilaterally: Yes Comments Peeling skin c/w tinea      Wt Readings from Last 3 Encounters:  10/09/22 157 lb (71.2 kg)  08/29/22 157 lb 9.6 oz (71.5 kg)  08/17/22 157 lb (71.2 kg)    BP 124/62   Pulse 78   Ht 5\' 2"  (1.575 m)   Wt 157 lb (71.2 kg)   SpO2 97%   BMI 28.72 kg/m  Assessment and Plan:  Problem List Items Addressed This Visit   None Visit Diagnoses     Rash and nonspecific skin eruption       suspect insect bites recommend topical cortisone as needed prn Benadryl    Tinea pedis of both feet       Relevant Medications   nystatin cream (MYCOSTATIN)   clotrimazole-betamethasone (LOTRISONE) cream       No follow-ups on file.   Partially dictated using Dragon software, any errors are not intentional.  Reubin Milan, MD [facility] [place], [place]

## 2022-10-16 ENCOUNTER — Ambulatory Visit
Admission: RE | Admit: 2022-10-16 | Discharge: 2022-10-16 | Disposition: A | Discharge status: Home / Self Care | Payer: PPO | Source: Ambulatory Visit | Attending: Internal Medicine | Admitting: Internal Medicine

## 2022-10-16 DIAGNOSIS — Z780 Asymptomatic menopausal state: Secondary | ICD-10-CM

## 2022-10-16 DIAGNOSIS — Z1231 Encounter for screening mammogram for malignant neoplasm of breast: Secondary | ICD-10-CM | POA: Insufficient documentation

## 2022-10-16 DIAGNOSIS — M8589 Other specified disorders of bone density and structure, multiple sites: Secondary | ICD-10-CM | POA: Diagnosis not present

## 2022-10-18 ENCOUNTER — Other Ambulatory Visit: Payer: Self-pay | Admitting: Internal Medicine

## 2022-10-18 DIAGNOSIS — M5136 Other intervertebral disc degeneration, lumbar region: Secondary | ICD-10-CM

## 2022-10-20 DIAGNOSIS — H900 Conductive hearing loss, bilateral: Secondary | ICD-10-CM | POA: Diagnosis not present

## 2022-10-20 DIAGNOSIS — H6123 Impacted cerumen, bilateral: Secondary | ICD-10-CM | POA: Diagnosis not present

## 2022-10-23 ENCOUNTER — Ambulatory Visit: Payer: Self-pay | Admitting: *Deleted

## 2022-10-23 ENCOUNTER — Telehealth: Payer: HMO | Admitting: Internal Medicine

## 2022-10-23 ENCOUNTER — Other Ambulatory Visit: Payer: Self-pay | Admitting: Internal Medicine

## 2022-10-23 NOTE — Telephone Encounter (Signed)
  Chief Complaint: + COVID Symptoms: congestion, cough Frequency: symptoms started Thursday Pertinent Negatives: Patient denies fever, SOB Disposition: [] ED /[] Urgent Care (no appt availability in office) / [x] Appointment(In office/virtual)/ []  [facility]/ [] Home Care/ [] Refused Recommended Disposition /[] [place] [facility]/ []  Follow-up with PCP Additional Notes: Patient advised per COVID protocol: treatment/isolation, appointment scheduled

## 2022-10-23 NOTE — Telephone Encounter (Signed)
Summary: Covid positive, wants Rx declined appt   Pt has tested positive for covid, she just has cold symptoms. Pt's husband is positive, pt wants to be prescribed the same thing she was prescribed two years ago when she tested positive for covid. Please advise  Best contact:  [phone]     Reason for Disposition  [1] HIGH RISK patient (e.g., weak immune system, age > 64 years, obesity with BMI 30 or higher, pregnant, chronic lung disease or other chronic medical condition) AND [2] COVID symptoms (e.g., cough, fever)  (Exceptions: Already seen by PCP and no new or worsening symptoms.)  Answer Assessment - Initial Assessment Questions 1. COVID-19 DIAGNOSIS: "How do you know that you have COVID?" (e.g., positive lab test or self-test, diagnosed by doctor or NP/PA, symptoms after exposure).     Home test- today 2. COVID-19 EXPOSURE: "Was there any known exposure to COVID before the symptoms began?" CDC Definition of close contact: within 6 feet (2 meters) for a total of 15 minutes or more over a 24-hour period.      Church exposure 3. ONSET: "When did the COVID-19 symptoms start?"      Thursday 4. WORST SYMPTOM: "What is your worst symptom?" (e.g., cough, fever, shortness of breath, muscle aches)     congestion 5. COUGH: "Do you have a cough?" If Yes, ask: "How bad is the cough?"       Yes- gone now 6. FEVER: "Do you have a fever?" If Yes, ask: "What is your temperature, how was it measured, and when did it start?"     Yes- gone now 7. RESPIRATORY STATUS: "Describe your breathing?" (e.g., normal; shortness of breath, wheezing, unable to speak)      Normal- chest congestion only 8. BETTER-SAME-WORSE: "Are you getting better, staying the same or getting worse compared to yesterday?"  If getting worse, ask, "In what way?"     Better- symptoms improving- head congestion not better 9. OTHER SYMPTOMS: "Do you have any other symptoms?"  (e.g., chills, fatigue, headache, loss of smell or taste,  muscle pain, sore throat)     Decreased smell 10. HIGH RISK DISEASE: "Do you have any chronic medical problems?" (e.g., asthma, heart or lung disease, weak immune system, obesity, etc.)       Age, diabetes 11. VACCINE: "Have you had the COVID-19 vaccine?" If Yes, ask: "Which one, how many shots, when did you get it?"       Several  Protocols used: Coronavirus (COVID-19) Diagnosed or Suspected-A-AH

## 2022-10-25 ENCOUNTER — Telehealth: Payer: Self-pay | Admitting: Internal Medicine

## 2022-10-25 NOTE — Telephone Encounter (Signed)
Pt is calling in because she says she has been having extreme headache tension in her head. Pt says she feels the tension on the back of her head down to the top of her shoulders. Pt says it is not related to COVID because she had them prior to COVID. Pt wants to know does Dr. Judithann Graves believe she needs a muscle relaxer to relieve the tension or is there something else she can take. Pt is requesting a call back.

## 2022-10-26 ENCOUNTER — Encounter: Payer: Self-pay | Admitting: Internal Medicine

## 2022-10-26 ENCOUNTER — Ambulatory Visit (INDEPENDENT_AMBULATORY_CARE_PROVIDER_SITE_OTHER): Payer: HMO | Admitting: Internal Medicine

## 2022-10-26 VITALS — BP 136/68 | HR 70 | Ht 62.0 in | Wt 155.8 lb

## 2022-10-26 DIAGNOSIS — U071 COVID-19: Secondary | ICD-10-CM | POA: Diagnosis not present

## 2022-10-26 DIAGNOSIS — J208 Acute bronchitis due to other specified organisms: Secondary | ICD-10-CM | POA: Diagnosis not present

## 2022-10-26 DIAGNOSIS — M62838 Other muscle spasm: Secondary | ICD-10-CM

## 2022-10-26 MED ORDER — BACLOFEN 10 MG PO TABS
10.0000 mg | ORAL_TABLET | Freq: Three times a day (TID) | ORAL | 0 refills | Status: DC
Start: 2022-10-26 — End: 2022-11-01

## 2022-10-26 MED ORDER — AZITHROMYCIN 250 MG PO TABS
ORAL_TABLET | ORAL | 0 refills | Status: AC
Start: 2022-10-26 — End: 2022-10-31

## 2022-10-26 NOTE — Patient Instructions (Signed)
Use ice or heat to your neck and shoulders.

## 2022-10-26 NOTE — Assessment & Plan Note (Signed)
Recent worsening due to prolonged work at the [employer] discussed Take baclofen tid; use heat or ice

## 2022-10-26 NOTE — Progress Notes (Signed)
Date:  10/26/2022   Name:  Melinda Ford   DOB:  [birth year]   MRN:  [id number]   Chief Complaint: Headache (2 weeks ago. Intermittent. Feels like a rubber band around head. Had headaches like this in the past. No vision changes. )  Headache  This is a new problem. The problem occurs intermittently. The problem has been waxing and waning. Pain location: neck and shoulder spasm. The pain quality is similar to prior headaches. The quality of the pain is described as aching and band-like. The pain is mild. Associated symptoms include coughing. Pertinent negatives include no fever.  Cough This is a new problem. The current episode started in the past 7 days. The problem has been gradually worsening. The problem occurs every few minutes. The cough is Productive of sputum. Associated symptoms include headaches, myalgias and wheezing. Pertinent negatives include no chest pain, chills, fever or shortness of breath.    Lab Results  Component Value Date   NA 140 04/12/2022   K 4.1 04/12/2022   CO2 28 04/12/2022   GLUCOSE 105 (H) 04/12/2022   BUN 19 04/12/2022   CREATININE 0.70 04/12/2022   CALCIUM 9.8 04/12/2022   EGFR 82 07/01/2021   GFRNONAA 98 09/10/2015   Lab Results  Component Value Date   CHOL 199 04/12/2022   HDL 84.50 04/12/2022   LDLCALC 97 04/12/2022   TRIG 90.0 04/12/2022   CHOLHDL 2 04/12/2022   Lab Results  Component Value Date   TSH 1.650 12/28/2020   Lab Results  Component Value Date   HGBA1C 7.6 (A) 08/29/2022   Lab Results  Component Value Date   WBC 5.2 07/01/2021   HGB 14.5 07/01/2021   HCT 44.4 07/01/2021   MCV 93 07/01/2021   PLT 284 07/01/2021   Lab Results  Component Value Date   ALT 63 (H) 04/12/2022   AST 49 (H) 04/12/2022   ALKPHOS 87 04/12/2022   BILITOT 0.4 04/12/2022   Lab Results  Component Value Date   VD25OH 34.0 12/28/2020     Review of Systems  Constitutional:  Negative for chills, fatigue and fever.  HENT:  Negative for  trouble swallowing.   Respiratory:  Positive for cough and wheezing. Negative for chest tightness and shortness of breath.   Cardiovascular:  Negative for chest pain.  Musculoskeletal:  Positive for myalgias and neck stiffness.  Neurological:  Positive for headaches.  Psychiatric/Behavioral:  Negative for dysphoric mood and sleep disturbance. The patient is not nervous/anxious.     Patient Active Problem List   Diagnosis Date Noted   Trigger ring finger of right hand 07/23/2022   Cervical radicular pain (left C5,6) 03/17/2021   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/2021   Chronic pain syndrome 03/17/2021   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/2021   Cervical paraspinal muscle spasm 02/10/2021   Gastroesophageal reflux disease 12/28/2020   Personal history of colonic polyps    Polyp of sigmoid colon    Abdominal pain, epigastric    Patient noncompliant with statin medication 09/12/2019   Type II diabetes mellitus with complication (HCC) 06/04/2019   Hyperlipidemia associated with type 2 diabetes mellitus (HCC) 05/17/2018   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 02/22/2018   Chronic midline thoracic back pain 02/22/2018   Tinea versicolor 09/20/2016   OSA (obstructive sleep apnea) 08/23/2016   Allergic rhinitis 07/13/2016   Overweight (BMI 25.0-29.9) 10/01/2015   Vitamin D deficiency 09/18/2014   Onychomycosis 09/16/2014   Degenerative disc disease,  lumbar 09/16/2014   Benign essential HTN 10/10/2010   Osteoporosis 10/10/2010    Allergies  Allergen Reactions   Ace Inhibitors Cough   Wheat Hives   Gramineae Pollens     Past Surgical History:  Procedure Laterality Date   ABDOMINAL HYSTERECTOMY     CATARACT EXTRACTION Right 2020   COLONOSCOPY WITH PROPOFOL N/A 10/10/2019   Procedure: COLONOSCOPY WITH PROPOFOL;  Surgeon: Midge Minium, MD;  Location[facility];  Service: Endoscopy;  Laterality: N/A;   ESOPHAGOGASTRODUODENOSCOPY (EGD)  WITH PROPOFOL N/A 10/10/2019   Procedure: ESOPHAGOGASTRODUODENOSCOPY (EGD) WITH PROPOFOL;  Surgeon: Midge Minium, MD;  Location: [facility];  Service: Endoscopy;  Laterality: N/A;  Diabetic - insulin pump and oral meds sleep apnea   POLYPECTOMY  10/10/2019   Procedure: POLYPECTOMY;  Surgeon: Midge Minium, MD;  Location: [facility];  Service: Endoscopy;;    Social History   Tobacco Use   Smoking status: Never   Smokeless tobacco: Never  Vaping Use   Vaping status: Never Used  Substance Use Topics   Alcohol use: Not Currently    Alcohol/week: 1.0 standard drink of alcohol    Types: 1 Standard drinks or equivalent per week   Drug use: Never     Medication list has been reviewed and updated.  Current Meds  Medication Sig   azithromycin (ZITHROMAX Z-PAK) 250 MG tablet UAD   baclofen (LIORESAL) 10 MG tablet Take 1 tablet (10 mg total) by mouth 3 (three) times daily.   Continuous Blood Gluc Sensor (FREESTYLE LIBRE 2 SENSOR) MISC USE AS DIRECTED   Continuous Glucose Sensor (DEXCOM G7 SENSOR) MISC 1 Device by Does not apply route as directed. Change sensor every 10 days   glucose blood (ONETOUCH VERIO) test strip USE 1 STRIP TO CHECK GLUCOSE 4 TIMES DAILY       10/26/2022   10:27 AM 10/09/2022    9:15 AM 07/28/2022    9:24 AM 05/04/2022    4:44 PM  GAD 7 : Generalized Anxiety Score  Nervous, Anxious, on Edge 0 1 0 0  Control/stop worrying 0 0 1 0  Worry too much - different things 1 0 1 0  Trouble relaxing 0 0 0 0  Restless 0 0 0 0  Easily annoyed or irritable 0 0 0 0  Afraid - awful might happen 0 0 0 0  Total GAD 7 Score 1 1 2  0  Anxiety Difficulty Not difficult at all Not difficult at all Not difficult at all Not difficult at all       10/26/2022   10:26 AM 10/09/2022    9:15 AM 08/17/2022   10:56 AM  Depression screen PHQ 2/9  Decreased Interest 0 0 0  Down, Depressed, Hopeless 0 0 0  PHQ - 2 Score 0 0 0  Altered sleeping 1 0 0  Tired, decreased energy 0 1  0  Change in appetite 0 1 0  Feeling bad or failure about yourself  0 1 0  Trouble concentrating 0 0 0  Moving slowly or fidgety/restless 0 1 0  Suicidal thoughts 0 0 0  PHQ-9 Score 1 4 0  Difficult doing work/chores Not difficult at all Not difficult at all Not difficult at all    BP Readings from Last 3 Encounters:  10/26/22 136/68  10/09/22 124/62  08/29/22 136/70    Physical Exam Constitutional:      Appearance: Normal appearance. She is well-developed.  HENT:     Nose:  Right Sinus: No maxillary sinus tenderness or frontal sinus tenderness.     Left Sinus: No maxillary sinus tenderness or frontal sinus tenderness.  Cardiovascular:     Rate and Rhythm: Normal rate and regular rhythm.  Pulmonary:     Effort: Pulmonary effort is normal.     Breath sounds: Wheezing present.  Musculoskeletal:     Cervical back: Spasms present. Muscular tenderness present. Decreased range of motion.  Neurological:     Mental Status: She is alert.     Wt Readings from Last 3 Encounters:  10/26/22 155 lb 12.8 oz (70.7 kg)  10/09/22 157 lb (71.2 kg)  08/29/22 157 lb 9.6 oz (71.5 kg)    BP 136/68   Pulse 70   Ht 5\' 2"  (1.575 m)   Wt 155 lb 12.8 oz (70.7 kg)   SpO2 98%   BMI 28.50 kg/m   Assessment and Plan:  Problem List Items Addressed This Visit     Cervical paraspinal muscle spasm - Primary    Recent worsening due to prolonged work at the [employer] discussed Take baclofen tid; use heat or ice      Relevant Medications   baclofen (LIORESAL) 10 MG tablet   Other Visit Diagnoses     Acute bronchitis due to COVID-19 virus       now 8 days out from onset with wheezing and sputum production take otc cough syrup Zpak - follow up if needed   Relevant Medications   azithromycin (ZITHROMAX Z-PAK) 250 MG tablet       No follow-ups on file.    Reubin Milan, MD [facility] [place]

## 2022-11-01 ENCOUNTER — Encounter: Payer: Self-pay | Admitting: Internal Medicine

## 2022-11-01 ENCOUNTER — Ambulatory Visit (INDEPENDENT_AMBULATORY_CARE_PROVIDER_SITE_OTHER): Payer: HMO | Admitting: Internal Medicine

## 2022-11-01 VITALS — BP 126/64 | HR 63 | Ht 62.0 in | Wt 155.0 lb

## 2022-11-01 DIAGNOSIS — G44209 Tension-type headache, unspecified, not intractable: Secondary | ICD-10-CM

## 2022-11-01 DIAGNOSIS — M542 Cervicalgia: Secondary | ICD-10-CM | POA: Diagnosis not present

## 2022-11-01 MED ORDER — PREDNISONE 10 MG PO TABS
ORAL_TABLET | ORAL | 0 refills | Status: AC
Start: 2022-11-01 — End: 2022-11-06

## 2022-11-01 MED ORDER — METHOCARBAMOL 500 MG PO TABS
500.0000 mg | ORAL_TABLET | Freq: Every day | ORAL | 0 refills | Status: AC
Start: 2022-11-01 — End: ?

## 2022-11-01 NOTE — Progress Notes (Signed)
Date:  11/01/2022   Name:  Melinda Ford   DOB:  1945-06-22   MRN:  [id number]   Chief Complaint: Headache (Having to cut in half because "made me drunk"- not helping)  Headache  This is a recurrent problem. The problem has been waxing and waning. Pain location: band like from front to back. The pain quality is similar to prior headaches. Associated symptoms include neck pain and tingling (in fingers). Pertinent negatives include no dizziness, fever, numbness or seizures. Treatments tried: baclofen. The treatment provided no relief (due to side effects).  Neck Pain  This is a chronic problem. The problem has been gradually improving. The pain is present in the midline. The pain is mild. Associated symptoms include headaches and tingling (in fingers). Pertinent negatives include no chest pain, fever or numbness.   MRI C-Spine 03/2021: IMPRESSION: 1. Cervical spondylosis and degenerative disc disease, causing prominent impingement at C4-5; moderate impingement at C3-4 and C5-6; and mild impingement at C6-7. 2. 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 This can occasionally cause impingement related symptoms. 3. Equivocal accentuated central cord signal at the C4-5 level on sagittal inversion recovery weighted images but not confirmed on the T2 weighted images, difficult to exclude subtle central myelomalacia.    Lab Results  Component Value Date   NA 140 04/12/2022   K 4.1 04/12/2022   CO2 28 04/12/2022   GLUCOSE 105 (H) 04/12/2022   BUN 19 04/12/2022   CREATININE 0.70 04/12/2022   CALCIUM 9.8 04/12/2022   EGFR 82 07/01/2021   GFRNONAA 98 09/10/2015   Lab Results  Component Value Date   CHOL 199 04/12/2022   HDL 84.50 04/12/2022   LDLCALC 97 04/12/2022   TRIG 90.0 04/12/2022   CHOLHDL 2 04/12/2022   Lab Results  Component Value Date   TSH 1.650 12/28/2020   Lab Results   Component Value Date   HGBA1C 7.6 (A) 08/29/2022   Lab Results  Component Value Date   WBC 5.2 07/01/2021   HGB 14.5 07/01/2021   HCT 44.4 07/01/2021   MCV 93 07/01/2021   PLT 284 07/01/2021   Lab Results  Component Value Date   ALT 63 (H) 04/12/2022   AST 49 (H) 04/12/2022   ALKPHOS 87 04/12/2022   BILITOT 0.4 04/12/2022   Lab Results  Component Value Date   VD25OH 34.0 12/28/2020     Review of Systems  Constitutional:  Negative for chills, fatigue and fever.  Respiratory:  Negative for chest tightness and shortness of breath.   Cardiovascular:  Negative for chest pain and palpitations.  Musculoskeletal:  Positive for arthralgias and neck pain.  Neurological:  Positive for tingling (in fingers) and headaches. Negative for dizziness, seizures and numbness.  Psychiatric/Behavioral:  Negative for dysphoric mood and sleep disturbance. The patient is not nervous/anxious.     Patient Active Problem List   Diagnosis Date Noted   Trigger ring finger of right hand 07/23/2022   Cervical radicular pain (left C5,6) 03/17/2021   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/2021   Chronic pain syndrome 03/17/2021   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/2021   Cervical paraspinal muscle spasm 02/10/2021   Gastroesophageal reflux disease 12/28/2020   Personal history of colonic polyps    Polyp of sigmoid colon    Abdominal pain, epigastric    Patient noncompliant with statin medication 09/12/2019   Type II diabetes mellitus with  complication (HCC) 06/04/2019   Hyperlipidemia associated with type 2 diabetes mellitus (HCC) 05/17/2018   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 02/22/2018   Chronic midline thoracic back pain 02/22/2018   Tinea versicolor 09/20/2016   OSA (obstructive sleep apnea) 08/23/2016   Allergic rhinitis 07/13/2016   Overweight (BMI 25.0-29.9) 10/01/2015   Vitamin D deficiency 09/18/2014   Onychomycosis 09/16/2014   Degenerative disc  disease, lumbar 09/16/2014   Benign essential HTN 10/10/2010   Osteoporosis 10/10/2010    Allergies  Allergen Reactions   Ace Inhibitors Cough   Wheat Hives   Gramineae Pollens     Past Surgical History:  Procedure Laterality Date   ABDOMINAL HYSTERECTOMY     CATARACT EXTRACTION Right 2020   COLONOSCOPY WITH PROPOFOL N/A 10/10/2019   Procedure: COLONOSCOPY WITH PROPOFOL;  Surgeon: Midge Minium, MD;  Location: [facility];  Service: Endoscopy;  Laterality: N/A;   ESOPHAGOGASTRODUODENOSCOPY (EGD) WITH PROPOFOL N/A 10/10/2019   Procedure: ESOPHAGOGASTRODUODENOSCOPY (EGD) WITH PROPOFOL;  Surgeon: Midge Minium, MD;  Location: [facility];  Service: Endoscopy;  Laterality: N/A;  Diabetic - insulin pump and oral meds sleep apnea   POLYPECTOMY  10/10/2019   Procedure: POLYPECTOMY;  Surgeon: Midge Minium, MD;  Location: [facility];  Service: Endoscopy;;    Social History   Tobacco Use   Smoking status: Never   Smokeless tobacco: Never  Vaping Use   Vaping status: Never Used  Substance Use Topics   Alcohol use: Not Currently    Alcohol/week: 1.0 standard drink of alcohol    Types: 1 Standard drinks or equivalent per week   Drug use: Never     Medication list has been reviewed and updated.  Current Meds  Medication Sig   acetaminophen (TYLENOL) 500 MG tablet Take 1,000 mg by mouth 2 (two) times daily. As needed   amLODipine (NORVASC) 10 MG tablet Take 1 tablet by mouth once daily   Calcium Carbonate-Vit D-Min (CALCIUM 1200 PO) Take 2 each by mouth daily.   Cholecalciferol (VITAMIN D) 2000 UNITS CAPS Take 1 capsule (2,000 Units total) by mouth daily.   clotrimazole-betamethasone (LOTRISONE) cream Apply 1 Application topically daily. To feet and rash under breasts   Continuous Blood Gluc Sensor (FREESTYLE LIBRE 2 SENSOR) MISC USE AS DIRECTED   Continuous Glucose Sensor (DEXCOM G7 SENSOR) MISC 1 Device by Does not apply route as directed. Change sensor every 10  days   gabapentin (NEURONTIN) 100 MG capsule Take 1-3 capsules (100-300 mg total) by mouth at bedtime. Follow written titration schedule.   GINKGO BILOBA COMPLEX PO Take 1 capsule by mouth daily.   glucose blood (ONETOUCH VERIO) test strip USE 1 STRIP TO CHECK GLUCOSE 4 TIMES DAILY   insulin aspart (NOVOLOG) 100 UNIT/ML injection USE A MAX OF 90 UNITS DAILY VIA INSULIN PUMP   Insulin Disposable Pump (V-GO 20) 20 UNIT/24HR KIT 1  ONCE DAILY   Insulin Pen Needle (PEN NEEDLES) 32G X 5 MM MISC Please   losartan (COZAAR) 100 MG tablet Take 1 tablet by mouth once daily   meloxicam (MOBIC) 15 MG tablet Take 1 tablet by mouth once daily   methocarbamol (ROBAXIN) 500 MG tablet Take 1 tablet (500 mg total) by mouth at bedtime.   NON FORMULARY CPAP @@ 9 cm H2O nightly   nystatin cream (MYCOSTATIN) Apply 1 Application topically 2 (two) times daily.   pravastatin (PRAVACHOL) 20 MG tablet Take 1 tablet by mouth once daily   predniSONE (DELTASONE) 10 MG tablet  Take 6 tablets (60 mg total) by mouth daily with breakfast for 1 day, THEN 5 tablets (50 mg total) daily with breakfast for 1 day, THEN 4 tablets (40 mg total) daily with breakfast for 1 day, THEN 3 tablets (30 mg total) daily with breakfast for 1 day, THEN 2 tablets (20 mg total) daily with breakfast for 1 day, THEN 1 tablet (10 mg total) daily with breakfast for 1 day.   Semaglutide, 1 MG/DOSE, (OZEMPIC, 1 MG/DOSE,) 4 MG/3ML SOPN Inject 1 mg weekly   triamcinolone cream (KENALOG) 0.5 % Apply 1 Application topically 3 (three) times daily. To rash on neck   [DISCONTINUED] baclofen (LIORESAL) 10 MG tablet Take 1 tablet (10 mg total) by mouth 3 (three) times daily.       11/01/2022   11:27 AM 10/26/2022   10:27 AM 10/09/2022    9:15 AM 07/28/2022    9:24 AM  GAD 7 : Generalized Anxiety Score  Nervous, Anxious, on Edge 0 0 1 0  Control/stop worrying 0 0 0 1  Worry too much - different things 0 1 0 1  Trouble relaxing 0 0 0 0  Restless 0 0 0 0  Easily  annoyed or irritable 0 0 0 0  Afraid - awful might happen 0 0 0 0  Total GAD 7 Score 0 1 1 2   Anxiety Difficulty Not difficult at all Not difficult at all Not difficult at all Not difficult at all       11/01/2022   11:27 AM 10/26/2022   10:26 AM 10/09/2022    9:15 AM  Depression screen PHQ 2/9  Decreased Interest 0 0 0  Down, Depressed, Hopeless 0 0 0  PHQ - 2 Score 0 0 0  Altered sleeping 1 1 0  Tired, decreased energy 0 0 1  Change in appetite 0 0 1  Feeling bad or failure about yourself  0 0 1  Trouble concentrating 0 0 0  Moving slowly or fidgety/restless 0 0 1  Suicidal thoughts 0 0 0  PHQ-9 Score 1 1 4   Difficult doing work/chores Not difficult at all Not difficult at all Not difficult at all    BP Readings from Last 3 Encounters:  11/01/22 126/64  10/26/22 136/68  10/09/22 124/62    Physical Exam Constitutional:      General: She is not in acute distress.    Appearance: She is well-developed. She is not ill-appearing.  HENT:     Head:     Jaw: There is normal jaw occlusion.     Comments: No TA tenderness or cord Cardiovascular:     Rate and Rhythm: Normal rate and regular rhythm.  Pulmonary:     Effort: Pulmonary effort is normal.     Breath sounds: No wheezing or rhonchi.  Musculoskeletal:     Cervical back: Spasms and tenderness present. No edema or torticollis. No pain with movement. Decreased range of motion.  Neurological:     Mental Status: She is alert.     Deep Tendon Reflexes: Reflexes normal.     Wt Readings from Last 3 Encounters:  11/01/22 155 lb (70.3 kg)  10/26/22 155 lb 12.8 oz (70.7 kg)  10/09/22 157 lb (71.2 kg)    BP 126/64   Pulse 63   Ht 5\' 2"  (1.575 m)   Wt 155 lb (70.3 kg)   SpO2 95%   BMI 28.35 kg/m   Assessment and Plan:  Problem List Items Addressed This Visit  None Visit Diagnoses     Tension headache    -  Primary   recommend Ice or heat Robaxin at bedtime to help with muscle spasm Steroid taper   Relevant  Medications   methocarbamol (ROBAXIN) 500 MG tablet   Neck pain       long standing symptoms consider increasing gabapentin   Relevant Medications   methocarbamol (ROBAXIN) 500 MG tablet   predniSONE (DELTASONE) 10 MG tablet       No follow-ups on file.    Reubin Milan, MD [facility] [place]

## 2022-11-01 NOTE — Patient Instructions (Addendum)
Continue to use heat on neck and upper back.  Continue gabapentin at bedtime.  Take the prednisone taper for 6 days - take with some food.  Take the Methocarbamol a few hours before bed.

## 2022-11-02 ENCOUNTER — Ambulatory Visit (INDEPENDENT_AMBULATORY_CARE_PROVIDER_SITE_OTHER): Payer: PPO | Admitting: Endocrinology

## 2022-11-02 ENCOUNTER — Encounter: Payer: Self-pay | Admitting: Endocrinology

## 2022-11-02 VITALS — BP 124/80 | HR 58 | Ht 62.0 in | Wt 155.6 lb

## 2022-11-02 DIAGNOSIS — Z794 Long term (current) use of insulin: Secondary | ICD-10-CM

## 2022-11-02 DIAGNOSIS — E1165 Type 2 diabetes mellitus with hyperglycemia: Secondary | ICD-10-CM

## 2022-11-02 DIAGNOSIS — E1169 Type 2 diabetes mellitus with other specified complication: Secondary | ICD-10-CM | POA: Diagnosis not present

## 2022-11-02 DIAGNOSIS — E785 Hyperlipidemia, unspecified: Secondary | ICD-10-CM

## 2022-11-02 LAB — COMPREHENSIVE METABOLIC PANEL
ALT: 35 U/L (ref 0–35)
AST: 30 U/L (ref 0–37)
Albumin: 4.1 g/dL (ref 3.5–5.2)
Alkaline Phosphatase: 83 U/L (ref 39–117)
BUN: 21 mg/dL (ref 6–23)
CO2: 28 mEq/L (ref 19–32)
Calcium: 10.4 mg/dL (ref 8.4–10.5)
Chloride: 106 mEq/L (ref 96–112)
Creatinine, Ser: 0.74 mg/dL (ref 0.40–1.20)
GFR: 77.65 mL/min (ref >60.00)
Glucose, Bld: 88 mg/dL (ref 70–99)
Potassium: 4.4 mEq/L (ref 3.5–5.1)
Sodium: 140 mEq/L (ref 135–145)
Total Bilirubin: 0.6 mg/dL (ref 0.2–1.2)
Total Protein: 7.4 g/dL (ref 6.0–8.3)

## 2022-11-02 LAB — LIPID PANEL
Cholesterol: 178 mg/dL (ref 0–200)
HDL: 70.7 mg/dL (ref >39.00)
LDL Cholesterol: 96 mg/dL (ref 0–99)
NonHDL: 107.01
Total CHOL/HDL Ratio: 3
Triglycerides: 56 mg/dL (ref 0.0–149.0)
VLDL: 11.2 mg/dL (ref 0.0–40.0)

## 2022-11-02 LAB — MICROALBUMIN / CREATININE URINE RATIO
Creatinine,U: 127.7 mg/dL
Microalb Creat Ratio: 0.7 mg/g (ref 0.0–30.0)
Microalb, Ur: 0.8 mg/dL (ref 0.0–1.9)

## 2022-11-02 LAB — HEMOGLOBIN A1C: Hemoglobin A1C: 7.6

## 2022-11-02 MED ORDER — OZEMPIC (2 MG/DOSE) 8 MG/3ML ~~LOC~~ SOPN: PEN_INJECTOR | SUBCUTANEOUS | 1 refills | Status: DC

## 2022-11-02 NOTE — Patient Instructions (Addendum)
Take 3-4 clicks before oatmeal in am  Take 5-6 clicks at supper at start of meal  Take sugar 4x daily  Ozempic next Rx will be 1/2 dose of the 2mg 

## 2022-11-02 NOTE — Progress Notes (Unsigned)
Patient ID: Melinda Ford, female   DOB: Apr 12, 1945, 77 y.o.   MRN: [id number]            Reason for Appointment: Follow-up for Type 2 Diabetes   History of Present Illness:          Date of diagnosis of type 2 diabetes mellitus: ?  2004        Background history:   She previously had been on metformin and also Avandia initially Detailed records of her care are not available prior to 2013 or so She was tried on Januvia in 2016 but this was not effective and this was stopped in 08/2015 She was started on insulin in 2/16 with small doses of Lantus insulin Her A1c has been consistently over 8% since about 2013  Recent history:   INSULIN regimen is:   V-go 20 unit pump since 12/23/15, boluses 0-4 clicks with meals     Non-insulin hypoglycemic drugs : Ozempic 0.5 weekly  Her A1c is 7.6 compared to 8   Current management, blood sugar patterns and problems identified: Off Ozempic 3 weeks and did not get a refill Although this appears to be helping her readings after meals her overall control is better As before she has significantly high readings after dinner and also progressively higher readings during the day With her pump her blood sugars are generally low normal early part of the morning but no significant hypoglycemia now She has continued difficulty remembering to bolus for her meals especially in the evenings with poor control most of the day and blood sugars averaging over 200 late at night She frequently will remember to bolus after the meal when the blood sugar has gone up and this is inadequate Her weight is about the same  She still has not done much exercise Since she does not monitor consistently with her Josephine Igo 2 sensor she was told to look into the Dexcom a couple of times but did not do so and she does not want to use her smart phone for monitoring her sugars She has previously taken her Ozempic, dialing half of the 1 mg pen for economy  Dinner 6-8 pm       Side  effects from medications have been: nausea and diarrhea from 1000 MG metformin  Freestyle libre 2 download for the last 2 weeks shows the following interpretation  Overnight blood sugars are the highest of the day after 10 PM but usually dropping significantly by 3-4 AM to near normal levels and minimal hypoglycemia During the day blood sugars are generally progressively rising between early morning and late evening  Blood sugars after breakfast are rising to average of 176 by midday and the rest of the day blood sugars are rising variably but most significantly POSTPRANDIAL readings are rising after 9 PM Data is incomplete on most days with only partial coverage of the tracing during the day and no readings since 7/30 No hypoglycemia during the day  CGM use % of time 45  2-week average/GV 161  Time in range       61%  % Time Above 180 33  % Time above 250 5  % Time Below 70 1     PRE-MEAL Fasting Lunch Dinner Bedtime Overall  Glucose range:       Averages: 117   226    POST-MEAL PC Breakfast PC Lunch PC Dinner  Glucose range:     Averages: 176  ?   Previously:   CGM  use % of time 61  2-week average/GV 170  Time in range      55  %  % Time Above 180 27  % Time above 250 14  % Time Below 70 4     PRE-MEAL Fasting Lunch Dinner 4 AM-6 AM Overall  Glucose range:       Averages: 160  171     POST-MEAL PC Breakfast PC Lunch PC Dinner  Glucose range:     Averages:  261 230    Diabetes self-care:    Typical meal intake: Breakfast is toast, oatmeal, sometimes bacon, milk   Lunch: Sometimes hot dogs, fish sandwich Evening meal is a protein like chicken, steak, pork chop, corn, greens.           Dietician visit, most recent: 04/2021  CDE visit: 12/2015   Weight history:  Wt Readings from Last 3 Encounters:  11/02/22 155 lb 9.6 oz (70.6 kg)  11/01/22 155 lb (70.3 kg)  10/26/22 155 lb 12.8 oz (70.7 kg)    Glycemic control:   Lab Results  Component Value Date    HGBA1C 7.6 (A) 08/29/2022   HGBA1C 8.0 (A) 04/12/2022   HGBA1C 7.9 (A) 11/22/2021   Lab Results  Component Value Date   MICROALBUR 0.8 11/02/2022   LDLCALC 96 11/02/2022   CREATININE 0.74 11/02/2022   Lab Results  Component Value Date   MICRALBCREAT 0.7 11/02/2022    Other active problems: See review of systems   Allergies as of 11/02/2022       Reactions   Ace Inhibitors Cough   Wheat Hives   Gramineae Pollens         Medication List        Accurate as of November 02, 2022 11:59 PM. If you have any questions, ask your nurse or doctor.          STOP taking these medications    Ozempic (1 MG/DOSE) 4 MG/3ML Sopn Generic drug: Semaglutide (1 MG/DOSE) Replaced by: Ozempic (2 MG/DOSE) 8 MG/3ML Sopn Stopped by: Reather Littler       TAKE these medications    acetaminophen 500 MG tablet Commonly known as: TYLENOL Take 1,000 mg by mouth 2 (two) times daily. As needed   amLODipine 10 MG tablet Commonly known as: NORVASC Take 1 tablet by mouth once daily   CALCIUM 1200 PO Take 2 each by mouth daily.   clotrimazole-betamethasone cream Commonly known as: LOTRISONE Apply 1 Application topically daily. To feet and rash under breasts   FreeStyle Libre 2 Sensor Misc USE AS DIRECTED What changed: Another medication with the same name was removed. Continue taking this medication, and follow the directions you see here. Changed by: Reather Littler   gabapentin 100 MG capsule Commonly known as: Neurontin Take 1-3 capsules (100-300 mg total) by mouth at bedtime. Follow written titration schedule.   GINKGO BILOBA COMPLEX PO Take 1 capsule by mouth daily.   insulin aspart 100 UNIT/ML injection Commonly known as: novoLOG USE A MAX OF 90 UNITS DAILY VIA INSULIN PUMP   losartan 100 MG tablet Commonly known as: COZAAR Take 1 tablet by mouth once daily   meloxicam 15 MG tablet Commonly known as: MOBIC Take 1 tablet by mouth once daily   methocarbamol 500 MG  tablet Commonly known as: ROBAXIN Take 1 tablet (500 mg total) by mouth at bedtime.   NON FORMULARY CPAP @@ 9 cm H2O nightly   nystatin cream Commonly known as: MYCOSTATIN Apply 1 Application  topically 2 (two) times daily.   omeprazole 20 MG capsule Commonly known as: PRILOSEC Take 1 capsule (20 mg total) by mouth 2 (two) times daily before a meal.   OneTouch Verio test strip Generic drug: glucose blood USE 1 STRIP TO CHECK GLUCOSE 4 TIMES DAILY   Ozempic (2 MG/DOSE) 8 MG/3ML Sopn Generic drug: Semaglutide (2 MG/DOSE) Inject 2mg  weekly Replaces: Ozempic (1 MG/DOSE) 4 MG/3ML Sopn Started by: Reather Littler   Pen Needles 32G X 5 MM Misc Please   pravastatin 20 MG tablet Commonly known as: PRAVACHOL Take 1 tablet by mouth once daily   predniSONE 10 MG tablet Commonly known as: DELTASONE Take 6 tablets (60 mg total) by mouth daily with breakfast for 1 day, THEN 5 tablets (50 mg total) daily with breakfast for 1 day, THEN 4 tablets (40 mg total) daily with breakfast for 1 day, THEN 3 tablets (30 mg total) daily with breakfast for 1 day, THEN 2 tablets (20 mg total) daily with breakfast for 1 day, THEN 1 tablet (10 mg total) daily with breakfast for 1 day. Start taking on: November 01, 2022   triamcinolone cream 0.5 % Commonly known as: KENALOG Apply 1 Application topically 3 (three) times daily. To rash on neck   V-Go 20 20 UNIT/24HR Kit 1  ONCE DAILY   Vitamin D 50 MCG (2000 UT) Caps Take 1 capsule (2,000 Units total) by mouth daily.        Allergies:  Allergies  Allergen Reactions   Ace Inhibitors Cough   Wheat Hives   Gramineae Pollens     Past Medical History:  Diagnosis Date   Allergy    Arthritis    Cataract    Diabetes mellitus without complication (HCC)    Hyperlipidemia    Hypertension    Motion sickness    planes   OSA (obstructive sleep apnea) 08/23/2016   CPAP   Osteoporosis    Presence of dental prosthetic device    implants   Radiculopathy of  lumbosacral region 06/22/2016   Right lateral epicondylitis 06/04/2017   Right sided sciatica 03/09/2016   Sleep apnea     Past Surgical History:  Procedure Laterality Date   ABDOMINAL HYSTERECTOMY     CATARACT EXTRACTION Right 2020   COLONOSCOPY WITH PROPOFOL N/A 10/10/2019   Procedure: COLONOSCOPY WITH PROPOFOL;  Surgeon: Midge Minium, MD;  Location: [facility];  Service: Endoscopy;  Laterality: N/A;   ESOPHAGOGASTRODUODENOSCOPY (EGD) WITH PROPOFOL N/A 10/10/2019   Procedure: ESOPHAGOGASTRODUODENOSCOPY (EGD) WITH PROPOFOL;  Surgeon: Midge Minium, MD;  Location: [facility];  Service: Endoscopy;  Laterality: N/A;  Diabetic - insulin pump and oral meds sleep apnea   POLYPECTOMY  10/10/2019   Procedure: POLYPECTOMY;  Surgeon: Midge Minium, MD;  Location: [facility];  Service: Endoscopy;;    Family History  Problem Relation Age of Onset   Breast cancer Other 40   Alcohol abuse Father    Mental illness Father    Stroke Father    Diabetes Sister    Asthma Brother    Diabetes Brother    Diabetes Maternal Aunt    Heart disease Maternal Aunt    Diabetes Maternal Uncle    Early death Maternal Uncle    Breast cancer Cousin 39       mat cousin    Social History:  reports that she has never smoked. She has never used smokeless tobacco. She reports that she does not currently use alcohol after a past usage  of about 1.0 standard drink of alcohol per week. She reports that she does not use drugs.   Review of Systems  Lipid history: Treated with pravastatin 3 times a week Although she was concerned about muscle cramps with this she is only having some occasional lower leg muscle cramps during the night and is continuing her pravastatin  Last LDL:  Lab Results  Component Value Date   CHOL 178 11/02/2022   CHOL 199 04/12/2022   CHOL 203 (H) 08/19/2021   Lab Results  Component Value Date   HDL 70.70 11/02/2022   HDL 84.50 04/12/2022   HDL 90.20 08/19/2021   Lab  Results  Component Value Date   LDLCALC 96 11/02/2022   LDLCALC 97 04/12/2022   LDLCALC 99 08/19/2021   Lab Results  Component Value Date   TRIG 56.0 11/02/2022   TRIG 90.0 04/12/2022   TRIG 69.0 08/19/2021   Lab Results  Component Value Date   CHOLHDL 3 11/02/2022   CHOLHDL 2 04/12/2022   CHOLHDL 2 08/19/2021   No results found for: "LDLDIRECT"          Hypertension: on treatment for several years, on amlodipine and losartan   Has been followed by PCP  BP Readings from Last 3 Encounters:  11/02/22 124/80  11/01/22 126/64  10/26/22 136/68   No renal dysfunction  Lab Results  Component Value Date   CREATININE 0.74 11/02/2022   CREATININE 0.70 04/12/2022   CREATININE 0.67 08/19/2021     OSTEOPOROSIS: Followed by PCP, lowest T score was- 2.6 at the spine compared to -3.0 before She was previously taking alendronate Also has had longstanding vitamin D deficiency  Has been taking 4000 units of vitamin D3   Lab Results  Component Value Date   VD25OH 34.0 12/28/2020   VD25OH 40.70 02/17/2020   VD25OH 28.44 (L) 10/22/2019   VD25OH 29.1 (L) 04/20/2017   VD25OH 24.92 (L) 05/18/2016   VD25OH 31.3 07/28/2015   VD25OH 26.9 (L) 03/17/2015   VD25OH 21.5 (L) 09/17/2014   Muscle cramps: She has periodic muscle cramps at night which are fairly significant but she thinks it is better with eating mustard, no previous history of hypokalemia  Headaches: She is asking about whether she can take prednisone for her headaches but unclear what the diagnosis is.  Because of her diabetes would like her not to take prednisone if possible  Physical Examination:  BP 124/80   Pulse (!) 58   Ht 5\' 2"  (1.575 m)   Wt 155 lb 9.6 oz (70.6 kg)   SpO2 97%   BMI 28.46 kg/m      ASSESSMENT:  Diabetes type 2 on insulin  See history of present illness for detailed discussion of current diabetes management, blood sugar patterns and problems identified  She is on insulin with the V-go  pump 20 unit basal and weekly Ozempic 0.5  A1c is around the same at 7.6  Her level of control is inadequate again because of postprandial hyperglycemia She is recently also not taking her Ozempic She is having difficulty remembering to bolus before eating consistently despite repeated reminders Also not checking blood sugars often enough to help with her control or analysis of her patterns Discussed again that if she boluses late after dinner at night this would cause low sugars overnight and she can get better control by bolusing before eating or also more adequately for dinner  Only 61 % of her readings within the target range but active sensor  time is only 45%   HYPERTENSION: Has adequate control with current regimen  Hypercholesterolemia: Will check lipids again today    PLAN:   She will try to remember to bolus before starting to eat regardless of Premeal blood sugar and add extra 1-Click for any readings over 200 Also she needs to add 1 more click to her oatmeal and take 3-4 clicks to cover this adequately Make sure she has protein with every meal She will also benefit from increasing Ozempic and we will move her up to 1 mg instead of 0.5 Discussed needing to take this regularly  Also discussed possibility of late hypoglycemia with delayed boluses  Explained to her that she can benefit from using the Dexcom instead of the [place] 2 sensor because of lack of adequate monitoring Discussed again the differences between Dexcom and freestyle libre version 2 Likely can get the reader as a sample for the Dexcom  If she is able to get the Dexcom from the pharmacy we will start this in the office as a sample of the reader   Patient Instructions  Take 3-4 clicks before oatmeal in am  Take 5-6 clicks at supper at start of meal  Take sugar 4x daily  Ozempic next Rx will be 1/2 dose of the 2mg         Reather Littler 11/06/2022, 9:16 AM   Note: This office note was prepared with  Dragon voice recognition system technology. Any transcriptional errors that result from this process are unintentional.  Addendum: All labs including microalbumin and LDL okay

## 2022-11-06 ENCOUNTER — Other Ambulatory Visit (HOSPITAL_COMMUNITY): Payer: Self-pay

## 2022-11-06 ENCOUNTER — Encounter: Payer: Self-pay | Admitting: Endocrinology

## 2022-11-06 ENCOUNTER — Telehealth: Payer: Self-pay

## 2022-11-06 MED ORDER — DEXCOM G7 SENSOR MISC: 1.0000 | 3 refills | Status: DC

## 2022-11-06 NOTE — Telephone Encounter (Signed)
*  Endo  Pharmacy Patient Advocate Encounter  Received notification from [facility] that Prior Authorization for Ozempic (2 MG/DOSE) 8MG /3ML pen-injectors  has been APPROVED from 11/06/2022 to 11/06/2023. Ran test claim, Copay is $3.05  PA #/Case ID/Reference #: [id number]

## 2022-11-07 ENCOUNTER — Encounter: Payer: Self-pay | Admitting: Endocrinology

## 2022-11-10 ENCOUNTER — Telehealth: Payer: Self-pay

## 2022-11-10 ENCOUNTER — Other Ambulatory Visit (HOSPITAL_COMMUNITY): Payer: Self-pay

## 2022-11-10 NOTE — Telephone Encounter (Signed)
Prior Auth required for [employer]

## 2022-11-14 ENCOUNTER — Encounter: Payer: Self-pay | Admitting: Endocrinology

## 2022-11-14 MED ORDER — FREESTYLE LIBRE 3 READER DEVI: 0 refills | Status: DC

## 2022-11-14 MED ORDER — FREESTYLE LIBRE 3 SENSOR MISC: 3 refills | Status: DC

## 2022-11-14 NOTE — Telephone Encounter (Signed)
Prescription sent for the [facility] 3 with reader

## 2022-11-16 ENCOUNTER — Ambulatory Visit: Payer: Self-pay

## 2022-11-16 NOTE — Telephone Encounter (Signed)
Summary: headache/eye pain   Patient called in stated she was having pain in her right eye near the ball and socket. She is also experiencing headaches. There are no appts before 9/16. Please f/u with patient          Called pt - left message on machine to return our call.  Will forward to office for follow up.

## 2022-11-16 NOTE — Telephone Encounter (Signed)
Patient called, left VM to return the call to the office to speak to the NT.     Summary: headache/eye pain   Patient called in stated she was having pain in her right eye near the ball and socket. She is also experiencing headaches. There are no appts before 9/16. Please f/u with patient

## 2022-11-17 NOTE — Telephone Encounter (Signed)
Called patient and left VM for patient to call back to discuss headaches. Provider does not have availability today and is out of office next week.  - Melinda Ford

## 2022-11-22 ENCOUNTER — Telehealth: Payer: Self-pay | Admitting: Internal Medicine

## 2022-11-22 NOTE — Telephone Encounter (Signed)
Called pt left VM to call back. Please get more details from pt when she calls in.  [place]

## 2022-11-22 NOTE — Telephone Encounter (Signed)
Called pt eft VM to call back.  Dr. Judithann Graves is not in the office this week she will return next week. We will do an xray at her appointment if needed.  KP

## 2022-11-22 NOTE — Telephone Encounter (Signed)
Pt returned call, I relayed message below however she is asking to be called again before 1:30 if possible.

## 2022-11-22 NOTE — Telephone Encounter (Signed)
Copied from CRM [id number]. Topic: General - Inquiry >> Nov 22, 2022  9:53 AM Melinda Ford wrote: Reason for CRM: patient has an appt on 8/28 for neck pain and request an xray ordered before her appt

## 2022-11-23 NOTE — Telephone Encounter (Signed)
Patient has called back requesting a call back from [place]. Please advise.Patient stated she has been waiting on a call back, she stated she was returning [place] call, that [place] requested a call back.   Patients callback # [phone]

## 2022-11-24 NOTE — Telephone Encounter (Signed)
Called pt she is aware that if needed a xray will be ordered at her appointment.  KP

## 2022-11-26 ENCOUNTER — Other Ambulatory Visit: Payer: Self-pay | Admitting: Internal Medicine

## 2022-11-26 MED ORDER — ONETOUCH VERIO VI STRP: ORAL_STRIP | 0 refills | Status: AC

## 2022-11-29 ENCOUNTER — Ambulatory Visit (INDEPENDENT_AMBULATORY_CARE_PROVIDER_SITE_OTHER): Payer: HMO | Admitting: Internal Medicine

## 2022-11-29 ENCOUNTER — Encounter: Payer: Self-pay | Admitting: Internal Medicine

## 2022-11-29 VITALS — BP 128/68 | HR 75 | Ht 62.0 in | Wt 157.6 lb

## 2022-11-29 DIAGNOSIS — M4802 Spinal stenosis, cervical region: Secondary | ICD-10-CM

## 2022-11-29 DIAGNOSIS — R21 Rash and other nonspecific skin eruption: Secondary | ICD-10-CM | POA: Diagnosis not present

## 2022-11-29 MED ORDER — GABAPENTIN 100 MG PO CAPS
100.0000 mg | ORAL_CAPSULE | Freq: Every day | ORAL | 0 refills | Status: DC
Start: 2022-11-29 — End: 2023-08-28

## 2022-11-29 NOTE — Progress Notes (Signed)
Date:  11/29/2022   Name:  Melinda Ford   DOB:  September 21, 1945   MRN:  [id number]   Chief Complaint: Migraine (Patient complaining of headaches and neck pain. She thinks her neck, and shoulder pain is causing her migraines.) and Eczema (Neck rash, itchy, and flaky. )  Neck Pain  This is a chronic problem. The problem has been waxing and waning. The pain is present in the left side and midline. The quality of the pain is described as aching and burning. The pain is moderate. Associated symptoms include headaches and numbness. Pertinent negatives include no chest pain, fever or weakness. Treatments tried: gabapentin and robaxin. The treatment provided mild relief.    Lab Results  Component Value Date   NA 140 11/02/2022   K 4.4 11/02/2022   CO2 28 11/02/2022   GLUCOSE 88 11/02/2022   BUN 21 11/02/2022   CREATININE 0.74 11/02/2022   CALCIUM 10.4 11/02/2022   EGFR 82 07/01/2021   GFRNONAA 98 09/10/2015   Lab Results  Component Value Date   CHOL 178 11/02/2022   HDL 70.70 11/02/2022   LDLCALC 96 11/02/2022   TRIG 56.0 11/02/2022   CHOLHDL 3 11/02/2022   Lab Results  Component Value Date   TSH 1.650 12/28/2020   Lab Results  Component Value Date   HGBA1C 7.6 (A) 08/29/2022   Lab Results  Component Value Date   WBC 5.2 07/01/2021   HGB 14.5 07/01/2021   HCT 44.4 07/01/2021   MCV 93 07/01/2021   PLT 284 07/01/2021   Lab Results  Component Value Date   ALT 35 11/02/2022   AST 30 11/02/2022   ALKPHOS 83 11/02/2022   BILITOT 0.6 11/02/2022   Lab Results  Component Value Date   VD25OH 34.0 12/28/2020     Review of Systems  Constitutional:  Negative for chills, fatigue and fever.  Respiratory:  Negative for chest tightness and shortness of breath.   Cardiovascular:  Negative for chest pain.  Musculoskeletal:  Positive for neck pain.  Skin:  Positive for rash.  Neurological:  Positive for numbness and headaches. Negative for weakness.  Psychiatric/Behavioral:   Negative for sleep disturbance.     Patient Active Problem List   Diagnosis Date Noted   Trigger ring finger of right hand 07/23/2022   Cervical radicular pain (left C5,6) 03/17/2021   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/2021   Chronic pain syndrome 03/17/2021   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/2021   Cervical paraspinal muscle spasm 02/10/2021   Gastroesophageal reflux disease 12/28/2020   Personal history of colonic polyps    Polyp of sigmoid colon    Abdominal pain, epigastric    Patient noncompliant with statin medication 09/12/2019   Type II diabetes mellitus with complication (HCC) 06/04/2019   Hyperlipidemia associated with type 2 diabetes mellitus (HCC) 05/17/2018   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 02/22/2018   Chronic midline thoracic back pain 02/22/2018   Tinea versicolor 09/20/2016   OSA (obstructive sleep apnea) 08/23/2016   Allergic rhinitis 07/13/2016   Overweight (BMI 25.0-29.9) 10/01/2015   Vitamin D deficiency 09/18/2014   Onychomycosis 09/16/2014   Degenerative disc disease, lumbar 09/16/2014   Benign essential HTN 10/10/2010   Osteoporosis 10/10/2010    Allergies  Allergen Reactions   Ace Inhibitors Cough   Wheat Hives   Gramineae Pollens     Past Surgical History:  Procedure Laterality Date   ABDOMINAL HYSTERECTOMY     CATARACT EXTRACTION Right 2020  COLONOSCOPY WITH PROPOFOL N/A 10/10/2019   Procedure: COLONOSCOPY WITH PROPOFOL;  Surgeon: Midge Minium, MD;  Location: [facility];  Service: Endoscopy;  Laterality: N/A;   ESOPHAGOGASTRODUODENOSCOPY (EGD) WITH PROPOFOL N/A 10/10/2019   Procedure: ESOPHAGOGASTRODUODENOSCOPY (EGD) WITH PROPOFOL;  Surgeon: Midge Minium, MD;  Location: [facility];  Service: Endoscopy;  Laterality: N/A;  Diabetic - insulin pump and oral meds sleep apnea   POLYPECTOMY  10/10/2019   Procedure: POLYPECTOMY;  Surgeon: Midge Minium, MD;  Location: [facility];   Service: Endoscopy;;    Social History   Tobacco Use   Smoking status: Never   Smokeless tobacco: Never  Vaping Use   Vaping status: Never Used  Substance Use Topics   Alcohol use: Not Currently    Alcohol/week: 1.0 standard drink of alcohol    Types: 1 Standard drinks or equivalent per week   Drug use: Never     Medication list has been reviewed and updated.  Current Meds  Medication Sig   acetaminophen (TYLENOL) 500 MG tablet Take 1,000 mg by mouth 2 (two) times daily. As needed   amLODipine (NORVASC) 10 MG tablet Take 1 tablet by mouth once daily   Calcium Carbonate-Vit D-Min (CALCIUM 1200 PO) Take 2 each by mouth daily.   Cholecalciferol (VITAMIN D) 2000 UNITS CAPS Take 1 capsule (2,000 Units total) by mouth daily.   clotrimazole-betamethasone (LOTRISONE) cream Apply 1 Application topically daily. To feet and rash under breasts   Continuous Blood Gluc Sensor (FREESTYLE LIBRE 2 SENSOR) MISC USE AS DIRECTED   Continuous Glucose Receiver (FREESTYLE LIBRE 3 READER) DEVI Use to monitor glucose levels   Continuous Glucose Sensor (DEXCOM G7 SENSOR) MISC 1 Device by Does not apply route as directed. Change sensor every 10 days   Continuous Glucose Sensor (FREESTYLE LIBRE 3 SENSOR) MISC Place 1 sensor on the skin every 14 days. Use to check glucose continuously   GINKGO BILOBA COMPLEX PO Take 1 capsule by mouth daily.   glucose blood (ONETOUCH VERIO) test strip USE 1 STRIP TO CHECK GLUCOSE 4 TIMES DAILY   insulin aspart (NOVOLOG) 100 UNIT/ML injection USE A MAX OF 90 UNITS DAILY VIA INSULIN PUMP   Insulin Disposable Pump (V-GO 20) 20 UNIT/24HR KIT 1  ONCE DAILY   Insulin Pen Needle (PEN NEEDLES) 32G X 5 MM MISC Please   losartan (COZAAR) 100 MG tablet Take 1 tablet by mouth once daily   meloxicam (MOBIC) 15 MG tablet Take 1 tablet by mouth once daily   methocarbamol (ROBAXIN) 500 MG tablet Take 1 tablet (500 mg total) by mouth at bedtime.   NON FORMULARY CPAP @@ 9 cm H2O nightly    nystatin cream (MYCOSTATIN) Apply 1 Application topically 2 (two) times daily.   omeprazole (PRILOSEC) 20 MG capsule Take 1 capsule (20 mg total) by mouth 2 (two) times daily before a meal.   pravastatin (PRAVACHOL) 20 MG tablet Take 1 tablet by mouth once daily   Semaglutide, 2 MG/DOSE, (OZEMPIC, 2 MG/DOSE,) 8 MG/3ML SOPN Inject 2mg  weekly   triamcinolone cream (KENALOG) 0.5 % Apply 1 Application topically 3 (three) times daily. To rash on neck       11/29/2022    8:47 AM 11/01/2022   11:27 AM 10/26/2022   10:27 AM 10/09/2022    9:15 AM  GAD 7 : Generalized Anxiety Score  Nervous, Anxious, on Edge 1 0 0 1  Control/stop worrying 0 0 0 0  Worry too much - different things 1 0 1 0  Trouble relaxing 0 0 0 0  Restless 0 0 0 0  Easily annoyed or irritable 0 0 0 0  Afraid - awful might happen 0 0 0 0  Total GAD 7 Score 2 0 1 1  Anxiety Difficulty Not difficult at all Not difficult at all Not difficult at all Not difficult at all       11/29/2022    8:47 AM 11/01/2022   11:27 AM 10/26/2022   10:26 AM  Depression screen PHQ 2/9  Decreased Interest 0 0 0  Down, Depressed, Hopeless 0 0 0  PHQ - 2 Score 0 0 0  Altered sleeping 0 1 1  Tired, decreased energy 1 0 0  Change in appetite 1 0 0  Feeling bad or failure about yourself  1 0 0  Trouble concentrating 0 0 0  Moving slowly or fidgety/restless 0 0 0  Suicidal thoughts 0 0 0  PHQ-9 Score 3 1 1   Difficult doing work/chores Not difficult at all Not difficult at all Not difficult at all    BP Readings from Last 3 Encounters:  11/29/22 128/68  11/02/22 124/80  11/01/22 126/64    Physical Exam Vitals and nursing note reviewed.  Constitutional:      General: She is not in acute distress.    Appearance: Normal appearance. She is well-developed.  HENT:     Head: Normocephalic and atraumatic.  Cardiovascular:     Rate and Rhythm: Normal rate and regular rhythm.  Pulmonary:     Effort: Pulmonary effort is normal. No respiratory  distress.     Breath sounds: No wheezing or rhonchi.  Musculoskeletal:     Cervical back: Spasms present. No crepitus. Decreased range of motion.  Skin:    General: Skin is warm and dry.     Findings: Rash present.     Comments: Dry eczematous rash on left neck  Neurological:     Mental Status: She is alert and oriented to person, place, and time.     Sensory: Sensory deficit (mild decreased sensation left hand) present.     Motor: Motor function is intact.     Coordination: Coordination is intact.  Psychiatric:        Mood and Affect: Mood normal.        Behavior: Behavior normal.     Wt Readings from Last 3 Encounters:  11/29/22 157 lb 9.6 oz (71.5 kg)  11/02/22 155 lb 9.6 oz (70.6 kg)  11/01/22 155 lb (70.3 kg)    BP 128/68   Pulse 75   Ht 5\' 2"  (1.575 m)   Wt 157 lb 9.6 oz (71.5 kg)   SpO2 97%   BMI 28.83 kg/m   Assessment and Plan:  Problem List Items Addressed This Visit       Unprioritized   Foraminal stenosis of cervical region - Primary    Continues on gabapentin and Robaxin MRI in 2022 showed disc disease and considered ESI Still having left sided symptoms and persistent headaches Will refer back to Pain management      Relevant Medications   gabapentin (NEURONTIN) 100 MG capsule   Other Relevant Orders   Ambulatory referral to Pain Clinic   Other Visit Diagnoses     Rash and nonspecific skin eruption       continue moisturizers and cortisone as needed       No follow-ups on file.    Reubin Milan, MD [facility] [place]

## 2022-11-29 NOTE — Assessment & Plan Note (Signed)
Continues on gabapentin and Robaxin MRI in 2022 showed disc disease and considered ESI Still having left sided symptoms and persistent headaches Will refer back to Pain management

## 2022-12-06 ENCOUNTER — Other Ambulatory Visit: Payer: Self-pay | Admitting: Internal Medicine

## 2022-12-06 DIAGNOSIS — M5136 Other intervertebral disc degeneration, lumbar region: Secondary | ICD-10-CM

## 2022-12-13 DIAGNOSIS — B351 Tinea unguium: Secondary | ICD-10-CM | POA: Diagnosis not present

## 2022-12-13 DIAGNOSIS — M79674 Pain in right toe(s): Secondary | ICD-10-CM | POA: Diagnosis not present

## 2022-12-13 DIAGNOSIS — M79675 Pain in left toe(s): Secondary | ICD-10-CM | POA: Diagnosis not present

## 2022-12-22 ENCOUNTER — Ambulatory Visit: Payer: HMO | Admitting: Internal Medicine

## 2022-12-26 ENCOUNTER — Telehealth: Payer: Self-pay | Admitting: Endocrinology

## 2022-12-26 DIAGNOSIS — E1165 Type 2 diabetes mellitus with hyperglycemia: Secondary | ICD-10-CM

## 2022-12-26 MED ORDER — V-GO 20 20 UNIT/24HR KIT
1.0000 | PACK | 3 refills | Status: DC
Start: 2022-12-26 — End: 2023-06-21

## 2022-12-26 NOTE — Telephone Encounter (Signed)
Patient called and gave message about medication refill being sent in.

## 2022-12-26 NOTE — Telephone Encounter (Signed)
Patient advising that she needs a refill on her Vigo20 pump she has to replace it today and Pharmacy advising she has no refills. Please advise.( Please leave a message)

## 2022-12-26 NOTE — Telephone Encounter (Signed)
LMTCB and sent mychart message.Prescription has been sent

## 2023-01-02 ENCOUNTER — Encounter: Payer: Self-pay | Admitting: Internal Medicine

## 2023-01-02 ENCOUNTER — Ambulatory Visit (INDEPENDENT_AMBULATORY_CARE_PROVIDER_SITE_OTHER): Payer: HMO | Admitting: Internal Medicine

## 2023-01-02 VITALS — BP 124/70 | HR 80 | Temp 98.1°F | Resp 14 | Ht 62.0 in | Wt 155.2 lb

## 2023-01-02 DIAGNOSIS — Z23 Encounter for immunization: Secondary | ICD-10-CM

## 2023-01-02 DIAGNOSIS — G4733 Obstructive sleep apnea (adult) (pediatric): Secondary | ICD-10-CM | POA: Diagnosis not present

## 2023-01-02 DIAGNOSIS — Z794 Long term (current) use of insulin: Secondary | ICD-10-CM

## 2023-01-02 DIAGNOSIS — I10 Essential (primary) hypertension: Secondary | ICD-10-CM

## 2023-01-02 DIAGNOSIS — E118 Type 2 diabetes mellitus with unspecified complications: Secondary | ICD-10-CM | POA: Diagnosis not present

## 2023-01-02 NOTE — Assessment & Plan Note (Signed)
BS improved under Endo management. She is using [employer] but is frustrated by the need to keep her phone and the sensor in short range. Lab Results  Component Value Date   HGBA1C 7.6 11/02/2022

## 2023-01-02 NOTE — Assessment & Plan Note (Signed)
Has not used CPAP for years. Now wants to try again - recommend using her current machine and purchasing nasal pillows from [employer] to see if she can tolerate. If needed, can order a new sleep study in order to get a new machine.

## 2023-01-02 NOTE — Assessment & Plan Note (Signed)
Normal exam with stable BP on losartan and amlodipine . No concerns or side effects to current medication. No change in regimen; continue low sodium diet.

## 2023-01-02 NOTE — Patient Instructions (Addendum)
Dr. Edward Jolly [facility] pain management. [facility] at [facility] [address], [address], [place], [place] [address] [phone]

## 2023-01-02 NOTE — Progress Notes (Signed)
Date:  01/02/2023   Name:  Melinda Ford   DOB:  1946/02/14   MRN:  [id number]   Chief Complaint: Follow-up (HTN)  Hypertension This is a chronic problem. The problem is controlled. Associated symptoms include headaches and neck pain. Pertinent negatives include no chest pain, palpitations or shortness of breath. Past treatments include angiotensin blockers and calcium channel blockers. The current treatment provides significant improvement.  Migraine  This is a recurrent problem. The problem has been gradually improving. The pain is located in the Bilateral region. The pain radiates to the left neck and right neck. The pain quality is similar to prior headaches. Associated symptoms include neck pain. Pertinent negatives include no abdominal pain, coughing, dizziness or weakness. Her past medical history is significant for hypertension.  Diabetes She presents for her follow-up diabetic visit. She has type 2 diabetes mellitus. Her disease course has been improving. Hypoglycemia symptoms include headaches. Pertinent negatives for hypoglycemia include no dizziness or nervousness/anxiousness. Pertinent negatives for diabetes include no chest pain, no fatigue and no weakness.  She was referred to pain management but has not been able to connect.   OSA - she was diagnosed about 5 years ago but never got used to using the machine.  Now she is thinking that she needs to resume using it and does not know how to get what she needs.  Lab Results  Component Value Date   NA 140 11/02/2022   K 4.4 11/02/2022   CO2 28 11/02/2022   GLUCOSE 88 11/02/2022   BUN 21 11/02/2022   CREATININE 0.74 11/02/2022   CALCIUM 10.4 11/02/2022   EGFR 82 07/01/2021   GFRNONAA 98 09/10/2015   Lab Results  Component Value Date   CHOL 178 11/02/2022   HDL 70.70 11/02/2022   LDLCALC 96 11/02/2022   TRIG 56.0 11/02/2022   CHOLHDL 3 11/02/2022   Lab Results  Component Value Date   TSH 1.650 12/28/2020   Lab  Results  Component Value Date   HGBA1C 7.6 11/02/2022   Lab Results  Component Value Date   WBC 5.2 07/01/2021   HGB 14.5 07/01/2021   HCT 44.4 07/01/2021   MCV 93 07/01/2021   PLT 284 07/01/2021   Lab Results  Component Value Date   ALT 35 11/02/2022   AST 30 11/02/2022   ALKPHOS 83 11/02/2022   BILITOT 0.6 11/02/2022   Lab Results  Component Value Date   VD25OH 34.0 12/28/2020     Review of Systems  Constitutional:  Negative for chills, fatigue and unexpected weight change.  HENT:  Negative for nosebleeds.   Eyes:  Negative for visual disturbance.  Respiratory:  Negative for cough, chest tightness, shortness of breath and wheezing.   Cardiovascular:  Negative for chest pain, palpitations and leg swelling.  Gastrointestinal:  Negative for abdominal pain, constipation and diarrhea.  Musculoskeletal:  Positive for neck pain.  Neurological:  Positive for headaches. Negative for dizziness, weakness and light-headedness.  Psychiatric/Behavioral:  Negative for dysphoric mood and sleep disturbance. The patient is not nervous/anxious.     Patient Active Problem List   Diagnosis Date Noted   Trigger ring finger of right hand 07/23/2022   Cervical radicular pain (left C5,6) 03/17/2021   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/2021   Chronic pain syndrome 03/17/2021   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/2021   Cervical paraspinal muscle spasm 02/10/2021   Gastroesophageal reflux disease 12/28/2020   History of colonic polyps    Polyp of sigmoid  colon    Abdominal pain, epigastric    Patient noncompliant with statin medication 09/12/2019   Type II diabetes mellitus with complication (HCC) 06/04/2019   Hyperlipidemia associated with type 2 diabetes mellitus (HCC) 05/17/2018   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 02/22/2018   Chronic midline thoracic back pain 02/22/2018   Tinea versicolor 09/20/2016   OSA (obstructive sleep apnea) 08/23/2016    Allergic rhinitis 07/13/2016   Overweight (BMI 25.0-29.9) 10/01/2015   Vitamin D deficiency 09/18/2014   Onychomycosis 09/16/2014   Degenerative disc disease, lumbar 09/16/2014   Benign essential HTN 10/10/2010   Osteoporosis 10/10/2010    Allergies  Allergen Reactions   Ace Inhibitors Cough   Wheat Hives   Gramineae Pollens     Past Surgical History:  Procedure Laterality Date   ABDOMINAL HYSTERECTOMY     CATARACT EXTRACTION Right 2020   COLONOSCOPY WITH PROPOFOL N/A 10/10/2019   Procedure: COLONOSCOPY WITH PROPOFOL;  Surgeon: Midge Minium, MD;  Location: [facility];  Service: Endoscopy;  Laterality: N/A;   ESOPHAGOGASTRODUODENOSCOPY (EGD) WITH PROPOFOL N/A 10/10/2019   Procedure: ESOPHAGOGASTRODUODENOSCOPY (EGD) WITH PROPOFOL;  Surgeon: Midge Minium, MD;  Location: [facility];  Service: Endoscopy;  Laterality: N/A;  Diabetic - insulin pump and oral meds sleep apnea   POLYPECTOMY  10/10/2019   Procedure: POLYPECTOMY;  Surgeon: Midge Minium, MD;  Location: [facility];  Service: Endoscopy;;    Social History   Tobacco Use   Smoking status: Never   Smokeless tobacco: Never  Vaping Use   Vaping status: Never Used  Substance Use Topics   Alcohol use: Not Currently    Alcohol/week: 1.0 standard drink of alcohol    Types: 1 Standard drinks or equivalent per week   Drug use: Never     Medication list has been reviewed and updated.  Current Meds  Medication Sig   acetaminophen (TYLENOL) 500 MG tablet Take 1,000 mg by mouth 2 (two) times daily. As needed   amLODipine (NORVASC) 10 MG tablet Take 1 tablet by mouth once daily   Calcium Carbonate-Vit D-Min (CALCIUM 1200 PO) Take 2 each by mouth daily.   Cholecalciferol (VITAMIN D) 2000 UNITS CAPS Take 1 capsule (2,000 Units total) by mouth daily.   clotrimazole-betamethasone (LOTRISONE) cream Apply 1 Application topically daily. To feet and rash under breasts   Continuous Glucose Receiver (FREESTYLE  LIBRE 3 READER) DEVI Use to monitor glucose levels   Continuous Glucose Sensor (FREESTYLE LIBRE 3 SENSOR) MISC Place 1 sensor on the skin every 14 days. Use to check glucose continuously   gabapentin (NEURONTIN) 100 MG capsule Take 1-3 capsules (100-300 mg total) by mouth at bedtime. Follow written titration schedule.   GINKGO BILOBA COMPLEX PO Take 1 capsule by mouth daily.   glucose blood (ONETOUCH VERIO) test strip USE 1 STRIP TO CHECK GLUCOSE 4 TIMES DAILY   insulin aspart (NOVOLOG) 100 UNIT/ML injection USE A MAX OF 90 UNITS DAILY VIA INSULIN PUMP   Insulin Disposable Pump (V-GO 20) 20 UNIT/24HR KIT 1 each by Other route as directed. 1  ONCE DAILY   Insulin Pen Needle (PEN NEEDLES) 32G X 5 MM MISC Please   losartan (COZAAR) 100 MG tablet Take 1 tablet by mouth once daily   meloxicam (MOBIC) 15 MG tablet Take 1 tablet by mouth once daily   NON FORMULARY CPAP @@ 9 cm H2O nightly   nystatin cream (MYCOSTATIN) Apply 1 Application topically 2 (two) times daily.   pravastatin (PRAVACHOL) 20 MG  tablet Take 1 tablet by mouth once daily   Semaglutide, 2 MG/DOSE, (OZEMPIC, 2 MG/DOSE,) 8 MG/3ML SOPN Inject 2mg  weekly   triamcinolone cream (KENALOG) 0.5 % Apply 1 Application topically 3 (three) times daily. To rash on neck       01/02/2023    2:51 PM 11/29/2022    8:47 AM 11/01/2022   11:27 AM 10/26/2022   10:27 AM  GAD 7 : Generalized Anxiety Score  Nervous, Anxious, on Edge 0 1 0 0  Control/stop worrying 1 0 0 0  Worry too much - different things 0 1 0 1  Trouble relaxing 0 0 0 0  Restless 0 0 0 0  Easily annoyed or irritable 0 0 0 0  Afraid - awful might happen 0 0 0 0  Total GAD 7 Score 1 2 0 1  Anxiety Difficulty Not difficult at all Not difficult at all Not difficult at all Not difficult at all       01/02/2023    2:51 PM 11/29/2022    8:47 AM 11/01/2022   11:27 AM  Depression screen PHQ 2/9  Decreased Interest 0 0 0  Down, Depressed, Hopeless 0 0 0  PHQ - 2 Score 0 0 0  Altered  sleeping 0 0 1  Tired, decreased energy 1 1 0  Change in appetite 1 1 0  Feeling bad or failure about yourself  0 1 0  Trouble concentrating 1 0 0  Moving slowly or fidgety/restless 1 0 0  Suicidal thoughts 0 0 0  PHQ-9 Score 4 3 1   Difficult doing work/chores Not difficult at all Not difficult at all Not difficult at all    BP Readings from Last 3 Encounters:  01/02/23 124/70  11/29/22 128/68  11/02/22 124/80    Physical Exam Vitals and nursing note reviewed.  Constitutional:      General: She is not in acute distress.    Appearance: Normal appearance. She is well-developed.  HENT:     Head: Normocephalic and atraumatic.  Neck:     Vascular: No carotid bruit.  Cardiovascular:     Rate and Rhythm: Normal rate and regular rhythm.     Heart sounds: No murmur heard. Pulmonary:     Effort: Pulmonary effort is normal. No respiratory distress.     Breath sounds: No wheezing or rhonchi.  Musculoskeletal:     Cervical back: Normal range of motion.     Right lower leg: No edema.     Left lower leg: No edema.  Lymphadenopathy:     Cervical: No cervical adenopathy.  Skin:    General: Skin is warm and dry.     Findings: No rash.  Neurological:     General: No focal deficit present.     Mental Status: She is alert and oriented to person, place, and time.  Psychiatric:        Mood and Affect: Mood normal.        Behavior: Behavior normal.     Wt Readings from Last 3 Encounters:  01/02/23 155 lb 3.2 oz (70.4 kg)  11/29/22 157 lb 9.6 oz (71.5 kg)  11/02/22 155 lb 9.6 oz (70.6 kg)    BP 124/70 (BP Location: Right Arm, Patient Position: Sitting, Cuff Size: Normal)   Pulse 80   Temp 98.1 F (36.7 C) (Oral)   Resp 14   Ht 5\' 2"  (1.575 m) Comment: per patient  Wt 155 lb 3.2 oz (70.4 kg)   SpO2 98%  BMI 28.39 kg/m   Assessment and Plan:  Problem List Items Addressed This Visit       Unprioritized   Benign essential HTN - Primary (Chronic)    Normal exam with  stable BP on losartan and amlodipine. No concerns or side effects to current medication. No change in regimen; continue low sodium diet.       OSA (obstructive sleep apnea) (Chronic)    Has not used CPAP for years. Now wants to try again - recommend using her current machine and purchasing nasal pillows from [employer] to see if she can tolerate. If needed, can order a new sleep study in order to get a new machine.      Type II diabetes mellitus with complication (HCC) (Chronic)    BS improved under Endo management. She is using [employer] but is frustrated by the need to keep her phone and the sensor in short range. Lab Results  Component Value Date   HGBA1C 7.6 11/02/2022         Other Visit Diagnoses     Need for immunization against influenza       Relevant Orders   Flu Vaccine Trivalent High Dose (Fluad) (Completed)   Long-term insulin use (HCC)           Return in about 6 months (around 07/03/2023) for HTN, CPX.    Reubin Milan, MD [facility] [place]

## 2023-01-16 ENCOUNTER — Ambulatory Visit
Payer: PPO | Attending: Student in an Organized Health Care Education/Training Program | Admitting: Student in an Organized Health Care Education/Training Program

## 2023-01-16 ENCOUNTER — Encounter: Payer: Self-pay | Admitting: Student in an Organized Health Care Education/Training Program

## 2023-01-16 VITALS — BP 183/72 | HR 57 | Temp 97.1°F | Ht 62.0 in | Wt 155.0 lb

## 2023-01-16 DIAGNOSIS — G894 Chronic pain syndrome: Secondary | ICD-10-CM | POA: Insufficient documentation

## 2023-01-16 DIAGNOSIS — G8929 Other chronic pain: Secondary | ICD-10-CM | POA: Diagnosis not present

## 2023-01-16 DIAGNOSIS — M5441 Lumbago with sciatica, right side: Secondary | ICD-10-CM | POA: Insufficient documentation

## 2023-01-16 DIAGNOSIS — M25551 Pain in right hip: Secondary | ICD-10-CM | POA: Insufficient documentation

## 2023-01-16 DIAGNOSIS — M5412 Radiculopathy, cervical region: Secondary | ICD-10-CM | POA: Diagnosis not present

## 2023-01-16 DIAGNOSIS — M4802 Spinal stenosis, cervical region: Secondary | ICD-10-CM | POA: Diagnosis not present

## 2023-01-16 DIAGNOSIS — M5416 Radiculopathy, lumbar region: Secondary | ICD-10-CM | POA: Diagnosis not present

## 2023-01-16 NOTE — Progress Notes (Signed)
PROVIDER NOTE: Information contained herein reflects review and annotations entered in association with encounter. Interpretation of such information and data should be left to medically-trained personnel. Information provided to patient can be located elsewhere in the medical record under "Patient Instructions". Document created using STT-dictation technology, any transcriptional errors that may result from process are unintentional.    Patient: Melinda Ford  Service Category: E/M  Provider: Edward Jolly, MD  DOB: [birth year]  DOS: 01/16/2023  Referring Provider: Reubin Milan, MD  MRN: [id number]  Specialty: Interventional Pain Management  PCP: Reubin Milan, MD  Type: Established Patient  Setting: Ambulatory outpatient    Location: Office  Delivery: Face-to-face     HPI  [name], a 77 y.o. year old female, is here today because of her Chronic right-sided low back pain with right-sided sciatica [M54.41, G89.29]. Melinda Ford primary complain today is Hip Pain (right)  Pertinent problems: Melinda Ford has Cervical spondylosis with radiculopathy; Cervical paraspinal muscle spasm; Cervical radicular pain (left C5,6); Foraminal stenosis of cervical region; and Chronic pain syndrome on their pertinent problem list. Pain Assessment: Severity of Chronic pain is reported as a 3 /10. Location: Hip Right/"sometimes my right leg gets weak". Onset: More than a month ago. Quality: Aching. Timing: Constant. Modifying factor(s): meds. Vitals:  height is 5\' 2"  (1.575 m) and weight is 155 lb (70.3 kg). Her temperature is 97.1 F (36.2 C) (abnormal). Her blood pressure is 183/72 (abnormal) and her pulse is 57 (abnormal). Her oxygen saturation is 100%.  BMI: Estimated body mass index is 28.35 kg/m as calculated from the following:   Height as of this encounter: 5\' 2"  (1.575 m).   Weight as of this encounter: 155 lb (70.3 kg).   Reason for encounter:   Patient's last visit with me was on  08/02/2021.  At that time she was having increased left arm and left hand pain.  She was noted to have cervical spondylosis with impingement at C4-C5, C3-C4.  She also has an accentuated central cord signal at C4-C5 that could be contributing to her radicular symptoms.  She states that those symptoms were better.  I am glad to hear that.  She does deal with occasional tension headaches.  She is endorsing right hip pain as well as weakness in her right leg.  This has been going on months.  She utilizes Tylenol and meloxicam to manage her pain.  Her prior hip xray was negative for osteoarthritis but she continues to have persistentright  hip pain worse with weight bearing.  At this point, I would like to further evaluate her hip with a right hip MRI to evaluate her bursa, tendons and muscle as potential pain generators for her.  For her right leg weakness, I would like to obtain a lumbar MRI.   ROS  Constitutional: Denies any fever or chills Gastrointestinal: No reported hemesis, hematochezia, vomiting, or acute GI distress Musculoskeletal:  Right hip pain with weightbearing Neurological:  Right leg weakness  Medication Review  Apoaequorin, Calcium Carbonate-Vit D-Min, FreeStyle Libre 3 Reader, FreeStyle Libre 3 Sensor, Ginkgo Biloba, NON FORMULARY, Pen Needles, Semaglutide (2 MG/DOSE), V-Go 20, Vitamin D, acetaminophen, amLODipine, clotrimazole-betamethasone, gabapentin, glucose blood, insulin aspart, losartan, meloxicam, nystatin cream, pravastatin, and triamcinolone cream  History Review  Allergy: Melinda Ford is allergic to ace inhibitors, wheat, and gramineae pollens. Drug: Melinda Ford  reports no history of drug use. Alcohol:  reports that she does not currently use alcohol after a past usage of about  1.0 standard drink of alcohol per week. Tobacco:  reports that she has never smoked. She has never used smokeless tobacco. Social: Melinda Ford  reports that she has never smoked. She has never  used smokeless tobacco. She reports that she does not currently use alcohol after a past usage of about 1.0 standard drink of alcohol per week. She reports that she does not use drugs. Medical:  has a past medical history of Allergy, Arthritis, Cataract, Diabetes mellitus without complication (HCC), Hyperlipidemia, Hypertension, Motion sickness, OSA (obstructive sleep apnea) (08/23/2016), Osteoporosis, Presence of dental prosthetic device, Radiculopathy of lumbosacral region (06/22/2016), Right lateral epicondylitis (06/04/2017), Right sided sciatica (03/09/2016), and Sleep apnea. Surgical: Melinda Ford  has a past surgical history that includes Abdominal hysterectomy; Cataract extraction (Right, 2020); Colonoscopy with propofol (N/A, 10/10/2019); Esophagogastroduodenoscopy (egd) with propofol (N/A, 10/10/2019); and polypectomy (10/10/2019). Family: family history includes Alcohol abuse in her father; Asthma in her brother; Breast cancer (age of onset: 40) in an other family member; Breast cancer (age of onset: 22) in her cousin; Diabetes in her brother, maternal aunt, maternal uncle, and sister; Early death in her maternal uncle; Heart disease in her maternal aunt; Mental illness in her father; Stroke in her father.  Laboratory Chemistry Profile   Renal Lab Results  Component Value Date   BUN 21 11/02/2022   CREATININE 0.74 11/02/2022   BCR 25 07/01/2021   GFR 77.65 11/02/2022   GFRAA 113 09/10/2015   GFRNONAA 98 09/10/2015    Hepatic Lab Results  Component Value Date   AST 30 11/02/2022   ALT 35 11/02/2022   ALBUMIN 4.1 11/02/2022   ALKPHOS 83 11/02/2022   LIPASE 27 07/01/2021    Electrolytes Lab Results  Component Value Date   NA 140 11/02/2022   K 4.4 11/02/2022   CL 106 11/02/2022   CALCIUM 10.4 11/02/2022    Bone Lab Results  Component Value Date   VD25OH 34.0 12/28/2020    Inflammation (CRP: Acute Phase) (ESR: Chronic Phase) Lab Results  Component Value Date   ESRSEDRATE 12  01/15/2015         Note: Above Lab results reviewed.  Recent Imaging Review  MM 3D SCREENING MAMMOGRAM BILATERAL BREAST CLINICAL DATA:  Screening.  EXAM: DIGITAL SCREENING BILATERAL MAMMOGRAM WITH TOMOSYNTHESIS AND CAD  TECHNIQUE: Bilateral screening digital craniocaudal and mediolateral oblique mammograms were obtained. Bilateral screening digital breast tomosynthesis was performed. The images were evaluated with computer-aided detection.  COMPARISON:  Previous exam(s).  ACR Breast Density Category b: There are scattered areas of fibroglandular density.  FINDINGS: There are no findings suspicious for malignancy.  IMPRESSION: No mammographic evidence of malignancy. A result letter of this screening mammogram will be mailed directly to the patient.  RECOMMENDATION: Screening mammogram in one year. (Code:SM-B-01Y)  BI-RADS CATEGORY  1: Negative.  Electronically Signed   By: Sherian Rein M.D.   On: 10/17/2022 13:29 Note: Reviewed        Physical Exam  General appearance: Well nourished, well developed, and well hydrated. In no apparent acute distress Mental status: Alert, oriented x 3 (person, place, & time)       Respiratory: No evidence of acute respiratory distress Eyes: PERLA Vitals: BP (!) 183/72   Pulse (!) 57   Temp (!) 97.1 F (36.2 C)   Ht 5\' 2"  (1.575 m)   Wt 155 lb (70.3 kg)   SpO2 100%   BMI 28.35 kg/m  BMI: Estimated body mass index is 28.35 kg/m as calculated from the  following:   Height as of this encounter: 5\' 2"  (1.575 m).   Weight as of this encounter: 155 lb (70.3 kg). Ideal: Ideal body weight: 50.1 kg (110 lb 7.2 oz) Adjusted ideal body weight: 58.2 kg (128 lb 4.3 oz)  Assessment   Diagnosis Status  1. Chronic right-sided low back pain with right-sided sciatica   2. Right hip pain   3. Foraminal stenosis of cervical region   4. Cervical radicular pain (left C5,6)   5. Chronic pain syndrome   6. Lumbar radicular pain    Having a  Flare-up Having a Flare-up Having a Flare-up     Plan of Care  1. Chronic right-sided low back pain with right-sided sciatica - MR LUMBAR SPINE WO CONTRAST; Future - DG Lumbar Spine Complete W/Bend; Future  2. Right hip pain - MR HIP RIGHT WO CONTRAST; Future  3. Foraminal stenosis of cervical region  4. Cervical radicular pain (left C5,6)  5. Chronic pain syndrome - MR HIP RIGHT WO CONTRAST; Future - MR LUMBAR SPINE WO CONTRAST; Future - DG Lumbar Spine Complete W/Bend; Future  6. Lumbar radicular pain - MR LUMBAR SPINE WO CONTRAST; Future - DG Lumbar Spine Complete W/Bend; Future   Orders:  Orders Placed This Encounter  Procedures   MR HIP RIGHT WO CONTRAST    Standing Status:   Future    Standing Expiration Date:   02/16/2023    Scheduling Instructions:     Please make sure that the patient understands that this needs to be done as soon as possible. Never have the patient do the imaging "just before the next appointment". Inform patient that having the imaging done within the [facility] Network will expedite the availability of the results and will provide      imaging availability to the requesting physician. In addition inform the patient that the imaging order has an expiration date and will not be renewed if not done within the active period.    Order Specific Question:   What is the patient's sedation requirement?    Answer:   No Sedation    Order Specific Question:   Does the patient have a pacemaker or implanted devices?    Answer:   No    Order Specific Question:   Preferred imaging location?    Answer:   [facility] (table limit-350lbs)    Order Specific Question:   Call Results- Best Contact Number?    Answer:   [phone] [facility])    Order Specific Question:   Radiology Contrast Protocol - do NOT remove file path    Answer:   \\[email or web address]\Radiant\[email or web address].PDF   MR LUMBAR SPINE WO CONTRAST    Patient presents with axial pain  with possible radicular component. Please assist [place] in identifying specific level(s) and laterality of any additional findings such as: 1. Facet (Zygapophyseal) joint DJD (Hypertrophy, space narrowing, subchondral sclerosis, and/or osteophyte formation) 2. DDD and/or IVDD (Loss of disc height, desiccation, gas patterns, osteophytes, endplate sclerosis, or "Black disc disease") 3. Pars defects 4. Spondylolisthesis, spondylosis, and/or spondyloarthropathies (include Degree/Grade of displacement in mm) (stability) 5. Vertebral body Fractures (acute/chronic) (state percentage of collapse) 6. Demineralization (osteopenia/osteoporotic) 7. Bone pathology 8. Foraminal narrowing  9. Surgical changes 10. Central, Lateral Recess, and/or Foraminal Stenosis (include AP diameter of stenosis in mm) 11. Surgical changes (hardware type, status, and presence of fibrosis) 12. Modic Type Changes (MRI only) 13. IVDD (Disc bulge, protrusion, herniation, extrusion) (Level, laterality, extent)    Standing Status:  Future    Standing Expiration Date:   02/16/2023    Scheduling Instructions:     Please make sure that the patient understands that this needs to be done as soon as possible. Never have the patient do the imaging "just before the next appointment". Inform patient that having the imaging done within the [facility] will expedite the availability of the results and will provide      imaging availability to the requesting physician. In addition inform the patient that the imaging order has an expiration date and will not be renewed if not done within the active period.    Order Specific Question:   What is the patient's sedation requirement?    Answer:   No Sedation    Order Specific Question:   Does the patient have a pacemaker or implanted devices?    Answer:   No    Order Specific Question:   Preferred imaging location?    Answer:   [facility] (table limit-350lbs)    Order Specific Question:    Call Results- Best Contact Number?    Answer:   [phone] [facility])    Order Specific Question:   Radiology Contrast Protocol - do NOT remove file path    Answer:   \\[email or web address]\Radiant\[email or web address].PDF   DG Lumbar Spine Complete W/Bend    Patient presents with axial pain with possible radicular component. Please assist [place] in identifying specific level(s) and laterality of any additional findings such as: 1. Facet (Zygapophyseal) joint DJD (Hypertrophy, space narrowing, subchondral sclerosis, and/or osteophyte formation) 2. DDD and/or IVDD (Loss of disc height, desiccation, gas patterns, osteophytes, endplate sclerosis, or "Black disc disease") 3. Pars defects 4. Spondylolisthesis, spondylosis, and/or spondyloarthropathies (include Degree/Grade of displacement in mm) (stability) 5. Vertebral body Fractures (acute/chronic) (state percentage of collapse) 6. Demineralization (osteopenia/osteoporotic) 7. Bone pathology 8. Foraminal narrowing  9. Surgical changes    Standing Status:   Future    Standing Expiration Date:   02/16/2023    Scheduling Instructions:     Please make sure that the patient understands that this needs to be done as soon as possible. Never have the patient do the imaging "just before the next appointment". Inform patient that having the imaging done within the [facility] will expedite the availability of the results and will provide      imaging availability to the requesting physician. In addition inform the patient that the imaging order has an expiration date and will not be renewed if not done within the active period.    Order Specific Question:   Reason for Exam (SYMPTOM  OR DIAGNOSIS REQUIRED)    Answer:   Low back pain    Order Specific Question:   Preferred imaging location?    Answer:   [facility]    Order Specific Question:   Call Results- Best Contact Number?    Answer:   [phone] [facility])    Order Specific  Question:   Radiology Contrast Protocol - do NOT remove file path    Answer:   \\[email or web address]\[email or web address].pdf    Order Specific Question:   Release to patient    Answer:   Immediate   Follow-up plan:   Return in about 30 days (around 02/15/2023) for review lumbar MRI, hip MRI, and flexion/extension xrays.     Recent Visits No visits were found meeting these conditions. Showing recent visits within past 90 days and meeting all other requirements Today's Visits Date Type Provider Dept  01/16/23 Office  Visit Edward Jolly, MD [facility]  Showing today's visits and meeting all other requirements Future Appointments No visits were found meeting these conditions. Showing future appointments within next 90 days and meeting all other requirements  I discussed the assessment and treatment plan with the patient. The patient was provided an opportunity to ask questions and all were answered. The patient agreed with the plan and demonstrated an understanding of the instructions.  Patient advised to call back or seek an in-person evaluation if the symptoms or condition worsens.  Duration of encounter: .  Total time on encounter, as per AMA guidelines included both the face-to-face and non-face-to-face time personally spent by the physician and/or other qualified health care professional(s) on the day of the encounter (includes time in activities that require the physician or other qualified health care professional and does not include time in activities normally performed by clinical staff). Physician's time may include the following activities when performed: Preparing to see the patient (e.g., pre-charting review of records, searching for previously ordered imaging, lab work, and nerve conduction tests) Review of prior analgesic pharmacotherapies. Reviewing PMP Interpreting ordered tests (e.g., lab work, imaging, nerve conduction tests) Performing  post-procedure evaluations, including interpretation of diagnostic procedures Obtaining and/or reviewing separately obtained history Performing a medically appropriate examination and/or evaluation Counseling and educating the patient/family/caregiver Ordering medications, tests, or procedures Referring and communicating with other health care professionals (when not separately reported) Documenting clinical information in the electronic or other health record Independently interpreting results (not separately reported) and communicating results to the patient/ family/caregiver Care coordination (not separately reported)  Note by: Edward Jolly, MD Date: 01/16/2023; Time: 1:05 PM

## 2023-01-16 NOTE — Progress Notes (Signed)
Safety precautions to be maintained throughout the outpatient stay will include: orient to surroundings, keep bed in low position, maintain call bell within reach at all times, provide assistance with transfer out of bed and ambulation.  

## 2023-01-26 ENCOUNTER — Other Ambulatory Visit: Payer: Self-pay | Admitting: Internal Medicine

## 2023-01-26 DIAGNOSIS — I10 Essential (primary) hypertension: Secondary | ICD-10-CM

## 2023-01-29 ENCOUNTER — Ambulatory Visit
Admission: RE | Admit: 2023-01-29 | Discharge: 2023-01-29 | Disposition: A | Discharge status: Home / Self Care | Payer: PPO | Source: Ambulatory Visit | Attending: Student in an Organized Health Care Education/Training Program | Admitting: Student in an Organized Health Care Education/Training Program

## 2023-01-29 DIAGNOSIS — G894 Chronic pain syndrome: Secondary | ICD-10-CM | POA: Insufficient documentation

## 2023-01-29 DIAGNOSIS — M5416 Radiculopathy, lumbar region: Secondary | ICD-10-CM | POA: Diagnosis not present

## 2023-01-29 DIAGNOSIS — M4726 Other spondylosis with radiculopathy, lumbar region: Secondary | ICD-10-CM | POA: Diagnosis not present

## 2023-01-29 DIAGNOSIS — M25551 Pain in right hip: Secondary | ICD-10-CM | POA: Insufficient documentation

## 2023-01-29 DIAGNOSIS — G8929 Other chronic pain: Secondary | ICD-10-CM

## 2023-01-29 DIAGNOSIS — M160 Bilateral primary osteoarthritis of hip: Secondary | ICD-10-CM | POA: Diagnosis not present

## 2023-01-29 DIAGNOSIS — M4856XA Collapsed vertebra, not elsewhere classified, lumbar region, initial encounter for fracture: Secondary | ICD-10-CM | POA: Diagnosis not present

## 2023-01-29 DIAGNOSIS — M5441 Lumbago with sciatica, right side: Secondary | ICD-10-CM | POA: Diagnosis not present

## 2023-01-29 DIAGNOSIS — M5127 Other intervertebral disc displacement, lumbosacral region: Secondary | ICD-10-CM | POA: Diagnosis not present

## 2023-01-29 DIAGNOSIS — M5116 Intervertebral disc disorders with radiculopathy, lumbar region: Secondary | ICD-10-CM | POA: Diagnosis not present

## 2023-01-29 DIAGNOSIS — M948X5 Other specified disorders of cartilage, thigh: Secondary | ICD-10-CM | POA: Diagnosis not present

## 2023-02-06 ENCOUNTER — Telehealth: Payer: Self-pay | Admitting: Endocrinology

## 2023-02-06 ENCOUNTER — Other Ambulatory Visit: Payer: Self-pay

## 2023-02-06 DIAGNOSIS — E1165 Type 2 diabetes mellitus with hyperglycemia: Secondary | ICD-10-CM

## 2023-02-06 MED ORDER — FREESTYLE LIBRE 3 SENSOR MISC
3 refills | Status: AC
Start: 2023-02-06 — End: ?

## 2023-02-06 NOTE — Telephone Encounter (Signed)
MEDICATION:  FreeStyle Libre 2 Sensor Continuous Glucose Sensor (FREESTYLE LIBRE 2 SENSOR)   PHARMACY:    [id number] - [place], [place] - [address] (Ph: [phone])    HAS THE PATIENT CONTACTED THEIR PHARMACY?  Yes  IS THIS A 90 DAY SUPPLY : Yes  IS PATIENT OUT OF MEDICATION: No  IF NOT; HOW MUCH IS LEFT: 8 days  LAST APPOINTMENT DATE: @8 /04/2022  NEXT APPOINTMENT DATE:@11 /03/2023  DO WE HAVE YOUR PERMISSION TO LEAVE A DETAILED MESSAGE?:Yes  OTHER COMMENTS:    **Let patient know to contact pharmacy at the end of the day to make sure medication is ready. **  ** Please notify patient to allow 48-72 hours to process**  **Encourage patient to contact the pharmacy for refills or they can request refills through [facility]**

## 2023-02-09 ENCOUNTER — Ambulatory Visit: Payer: PPO | Admitting: Endocrinology

## 2023-02-11 DIAGNOSIS — R55 Syncope and collapse: Secondary | ICD-10-CM | POA: Diagnosis not present

## 2023-02-11 DIAGNOSIS — R9431 Abnormal electrocardiogram [ECG] [EKG]: Secondary | ICD-10-CM | POA: Diagnosis not present

## 2023-02-11 DIAGNOSIS — R918 Other nonspecific abnormal finding of lung field: Secondary | ICD-10-CM | POA: Diagnosis not present

## 2023-02-11 DIAGNOSIS — W19XXXA Unspecified fall, initial encounter: Secondary | ICD-10-CM | POA: Diagnosis not present

## 2023-02-11 DIAGNOSIS — R059 Cough, unspecified: Secondary | ICD-10-CM | POA: Diagnosis not present

## 2023-02-11 DIAGNOSIS — W1830XA Fall on same level, unspecified, initial encounter: Secondary | ICD-10-CM | POA: Diagnosis not present

## 2023-02-11 DIAGNOSIS — Z20822 Contact with and (suspected) exposure to covid-19: Secondary | ICD-10-CM | POA: Diagnosis not present

## 2023-02-11 DIAGNOSIS — Z452 Encounter for adjustment and management of vascular access device: Secondary | ICD-10-CM | POA: Diagnosis not present

## 2023-02-11 DIAGNOSIS — I10 Essential (primary) hypertension: Secondary | ICD-10-CM | POA: Diagnosis not present

## 2023-02-11 DIAGNOSIS — R410 Disorientation, unspecified: Secondary | ICD-10-CM | POA: Diagnosis not present

## 2023-02-11 DIAGNOSIS — D72829 Elevated white blood cell count, unspecified: Secondary | ICD-10-CM | POA: Diagnosis not present

## 2023-02-11 DIAGNOSIS — J3489 Other specified disorders of nose and nasal sinuses: Secondary | ICD-10-CM | POA: Diagnosis not present

## 2023-02-11 DIAGNOSIS — R509 Fever, unspecified: Secondary | ICD-10-CM | POA: Diagnosis not present

## 2023-02-11 DIAGNOSIS — I119 Hypertensive heart disease without heart failure: Secondary | ICD-10-CM | POA: Diagnosis not present

## 2023-02-11 DIAGNOSIS — S0219XA Other fracture of base of skull, initial encounter for closed fracture: Secondary | ICD-10-CM | POA: Diagnosis not present

## 2023-02-12 ENCOUNTER — Telehealth: Payer: Self-pay

## 2023-02-12 NOTE — Transitions of Care (Post Inpatient/ED Visit) (Signed)
   02/12/2023  Name: Melinda Ford MRN: [id number] DOB: 1945/12/02  Today's TOC FU Call Status: Today's TOC FU Call Status:: Unsuccessful Call (1st Attempt) Unsuccessful Call (1st Attempt) Date: 02/12/23  Attempted to reach the patient regarding the most recent Inpatient/ED visit.  Follow Up Plan: Additional outreach attempts will be made to reach the patient to complete the Transitions of Care (Post Inpatient/ED visit) call.   Signature Karena Addison, LPN [facility] Nurse Health Advisor Direct Dial [phone]

## 2023-02-12 NOTE — Transitions of Care (Post Inpatient/ED Visit) (Signed)
02/12/2023  Name: Melinda Ford MRN: [id number] DOB: 1945/12/01  Today's TOC FU Call Status: Today's TOC FU Call Status:: Successful TOC FU Call Completed Unsuccessful Call (1st Attempt) Date: 02/12/23 [facility] FU Call Complete Date: 02/12/23 Patient's Name and Date of Birth confirmed.  Transition Care Management Follow-up Telephone Call Date of Discharge: 02/11/23 Discharge Facility: Other [facility]) Name of Other ([facility]) Discharge Facility: [facility] Type of Discharge: Inpatient Admission Primary Inpatient Discharge Diagnosis:: syncope How have you been since you were released from the hospital?: Better Any questions or concerns?: No  Items Reviewed: Did you receive and understand the discharge instructions provided?: Yes Medications obtained,verified, and reconciled?: Yes (Medications Reviewed) Any new allergies since your discharge?: No Dietary orders reviewed?: Yes Do you have support at home?: Yes People in Home: spouse  Medications Reviewed Today: Medications Reviewed Today     Reviewed by Karena Addison, LPN (Licensed Practical Nurse) on 02/12/23 at 1113  Med List Status: <None>   Medication Order Taking? Sig Documenting Provider Last Dose Status Informant  acetaminophen (TYLENOL) 500 MG tablet [id number] No Take 1,000 mg by mouth 2 (two) times daily. As needed [provider] Taking Active   amLODipine (NORVASC) 10 MG tablet [id number] No Take 1 tablet by mouth once daily Reubin Milan, MD Taking Active   Apoaequorin (PREVAGEN PO) [id number] No Take by mouth. [provider] Taking Active   Calcium Carbonate-Vit D-Min (CALCIUM 1200 PO) [id number] No Take 2 each by mouth daily. [provider] Taking Active   Cholecalciferol (VITAMIN D) 2000 UNITS CAPS [id number] No Take 1 capsule (2,000 Units total) by mouth daily. Schuyler Amor, MD Taking Active   clotrimazole-betamethasone (LOTRISONE) cream [id number] No Apply 1 Application topically  daily. To feet and rash under breasts Judithann Graves Nyoka Cowden, MD Taking Active   Continuous Glucose Receiver (FREESTYLE LIBRE 3 READER) DEVI [id number] No Use to monitor glucose levels Reather Littler, MD Taking Active   Continuous Glucose Sensor (FREESTYLE LIBRE 3 SENSOR) [place] [id number]  Place 1 sensor on the skin every 14 days. Use to check glucose continuously Thapa, [place], MD  Active   gabapentin (NEURONTIN) 100 MG capsule [id number] No Take 1-3 capsules (100-300 mg total) by mouth at bedtime. Follow written titration schedule. Reubin Milan, MD Taking Active   [facility] BILOBA COMPLEX PO [id number] No Take 1 capsule by mouth daily.  Patient not taking: Reported on 01/16/2023   [provider] Not Taking Active   glucose blood [facility] VERIO) test strip [id number] No USE 1 STRIP TO CHECK GLUCOSE 4 TIMES DAILY Reubin Milan, MD Taking Active   insulin aspart (NOVOLOG) 100 UNIT/ML injection [id number] No USE A MAX OF 90 UNITS DAILY VIA INSULIN PUMP Reather Littler, MD Taking Active   Insulin Disposable Pump (V-GO 20) 20 UNIT/24HR KIT [id number] No 1 each by Other route as directed. 1  ONCE DAILY Thapa, [place], MD Taking Active   Insulin Pen Needle (PEN NEEDLES) 32G X 5 MM MISC [id number] No Please Reather Littler, MD Taking Active   losartan (COZAAR) 100 MG tablet [id number]  Take 1 tablet by mouth once daily Reubin Milan, MD  Active   meloxicam [facility]) 15 MG tablet [id number] No Take 1 tablet by mouth once daily Reubin Milan, MD Taking Active   NON FORMULARY [id number] No CPAP @@ 9 cm H2O nightly [provider] Taking Active   nystatin cream (MYCOSTATIN) [id number] No Apply 1 Application topically 2 (two) times daily. [provider] Taking Active  pravastatin (PRAVACHOL) 20 MG tablet [id number] No Take 1 tablet by mouth once daily Reather Littler, MD Taking Active   Semaglutide, 2 MG/DOSE, (OZEMPIC, 2 MG/DOSE,) 8 MG/3ML SOPN [id number] No Inject 2mg  weekly Reather Littler, MD Taking Active    triamcinolone cream (KENALOG) 0.5 % [id number] No Apply 1 Application topically 3 (three) times daily. To rash on neck Reubin Milan, MD Taking Active             Home Care and Equipment/Supplies: Were Home Health Services Ordered?: NA Any new equipment or medical supplies ordered?: NA  Functional Questionnaire: Do you need assistance with bathing/showering or dressing?: No Do you need assistance with meal preparation?: No Do you need assistance with eating?: No Do you have difficulty maintaining continence: No Do you need assistance with getting out of bed/getting out of a chair/moving?: No Do you have difficulty managing or taking your medications?: No  Follow up appointments reviewed: PCP Follow-up appointment confirmed?: Yes Date of PCP follow-up appointment?: 02/19/23 Follow-up Provider: berglund Specialist [facility] Follow-up appointment confirmed?: Yes Date of Specialist follow-up appointment?: 02/13/23 Follow-Up Specialty Provider:: endo Do you need transportation to your follow-up appointment?: No Do you understand care options if your condition(s) worsen?: Yes-patient verbalized understanding    SIGNATURE Karena Addison, LPN [facility] Nurse Health Advisor Direct Dial [phone]

## 2023-02-13 ENCOUNTER — Ambulatory Visit (INDEPENDENT_AMBULATORY_CARE_PROVIDER_SITE_OTHER): Payer: PPO | Admitting: Endocrinology

## 2023-02-13 ENCOUNTER — Encounter: Payer: Self-pay | Admitting: Endocrinology

## 2023-02-13 VITALS — BP 118/60 | HR 68 | Resp 20 | Ht 62.0 in | Wt 158.2 lb

## 2023-02-13 DIAGNOSIS — E1165 Type 2 diabetes mellitus with hyperglycemia: Secondary | ICD-10-CM

## 2023-02-13 DIAGNOSIS — Z794 Long term (current) use of insulin: Secondary | ICD-10-CM | POA: Diagnosis not present

## 2023-02-13 LAB — POCT GLYCOSYLATED HEMOGLOBIN (HGB A1C): Hemoglobin A1C: 7.4 % — AB (ref 4.0–5.6)

## 2023-02-13 MED ORDER — GVOKE HYPOPEN 1-PACK 1 MG/0.2ML ~~LOC~~ SOAJ: 1.0000 mg | SUBCUTANEOUS | 2 refills | Status: AC | PRN

## 2023-02-13 MED ORDER — FREESTYLE LIBRE 3 PLUS SENSOR MISC: 1.0000 | 3 refills | Status: AC

## 2023-02-13 NOTE — Progress Notes (Unsigned)
Outpatient Endocrinology Note [place] Termaine Roupp, MD  02/14/23  Patient's Name: Melinda Ford    DOB: [birth year]    MRN: [id number]                                                    REASON OF VISIT: Follow up of type 2 diabetes mellitus  PCP: Reubin Milan, MD  HISTORY OF PRESENT ILLNESS:   Melinda Ford is a 77 y.o. old female with past medical history listed below, is here for follow up for type 2 diabetes mellitus.   Pertinent Diabetes History: Patient was diagnosed with type 2 diabetes mellitus around 2004.  Insulin regimen was started in 2016.  Chronic Diabetes Complications : Retinopathy: no. Last ophthalmology exam was done on annually, reportedly, following with ophthalmology regularly.  Nephropathy: no, on ACE/ARB / losartan Peripheral neuropathy: yes, on gabapentin Coronary artery disease: no Stroke: no  Relevant comorbidities and cardiovascular risk factors: Obesity: no Body mass index is 28.94 kg/m.  Hypertension: Yes  Hyperlipidemia : Yes, on statin   Current / Home Diabetic regimen includes: V-Go 20 units pump since September 2017.  Boluses 0-4 clicks with meals. Ozempic 1 mg weekly.  Prior diabetic medications: Januvia, metformin and Avandia in the past.  Lantus in the past.  Nausea and diarrhea with metformin 1000 mg.  Glycemic data:    CONTINUOUS GLUCOSE MONITORING SYSTEM (CGMS) INTERPRETATION: At today's visit, we reviewed CGM downloads. The full report is scanned in the media. Reviewing the CGM trends, blood glucose are as follows:  FreeStyle Libre 2+ CGM-  Sensor Download (Sensor download was reviewed and summarized below.) Dates: October 30 to February 13, 2023, 14 days Sensor Average: 150  Glucose Management Indicator: 6.9% Glucose Variability: 47.5%  % data captured: 58%  Glycemic Trends:  <54: 0% 54-70: 6% 71-180: 63% 181-250: 22% 251-400: 9%   Interpretation: -Patient had mostly acceptable blood sugar with occasional hypoglycemia  in the early morning with blood sugar in 50 - 60s, sometimes hyperglycemia around bedtime after supper.  She had occasional hyperglycemia related with meals with blood sugar up to 250s.  Most of the other times and in between the meals acceptable blood sugar.  Hypoglycemia: Patient has minor hypoglycemic episodes. Patient has hypoglycemia awareness.  Factors modifying glucose control: 1.  Diabetic diet assessment: 3 meals a day.  2.  Staying active or exercising: No formal exercise.  3.  Medication compliance: compliant most of the time.  # OSTEOPOROSIS: Followed by PCP, lowest T score was- 2.6 at the spine compared to -3.0 before. She was previously taking alendronate. Also has had longstanding vitamin D deficiency.   Has been taking 4000 units of vitamin D3   Interval history  CGM and diabetes regimen as reviewed above.  She is concerned about continue weight loss and muscle loss with muscle weakness does not want to continue Ozempic anymore.  She does not want to lose anymore weight.  No other complaints today.  REVIEW OF SYSTEMS As per history of present illness.   PAST MEDICAL HISTORY: Past Medical History:  Diagnosis Date   Allergy    Arthritis    Cataract    Diabetes mellitus without complication (HCC)    Hyperlipidemia    Hypertension    Motion sickness    planes   OSA (obstructive sleep apnea) 08/23/2016  CPAP   Osteoporosis    Presence of dental prosthetic device    implants   Radiculopathy of lumbosacral region 06/22/2016   Right lateral epicondylitis 06/04/2017   Right sided sciatica 03/09/2016   Sleep apnea     PAST SURGICAL HISTORY: Past Surgical History:  Procedure Laterality Date   ABDOMINAL HYSTERECTOMY     CATARACT EXTRACTION Right 2020   COLONOSCOPY WITH PROPOFOL N/A 10/10/2019   Procedure: COLONOSCOPY WITH PROPOFOL;  Surgeon: Midge Minium, MD;  [facility];  Service: Endoscopy;  Laterality: N/A;   ESOPHAGOGASTRODUODENOSCOPY (EGD) WITH  PROPOFOL N/A 10/10/2019   Procedure: ESOPHAGOGASTRODUODENOSCOPY (EGD) WITH PROPOFOL;  Surgeon: Midge Minium, MD;  Location: [facility];  Service: Endoscopy;  Laterality: N/A;  Diabetic - insulin pump and oral meds sleep apnea   POLYPECTOMY  10/10/2019   Procedure: POLYPECTOMY;  Surgeon: Midge Minium, MD;  Location: [facility];  Service: Endoscopy;;    ALLERGIES: Allergies  Allergen Reactions   Ace Inhibitors Cough   Wheat Hives   Gramineae Pollens     FAMILY HISTORY:  Family History  Problem Relation Age of Onset   Breast cancer Other 40   Alcohol abuse Father    Mental illness Father    Stroke Father    Diabetes Sister    Asthma Brother    Diabetes Brother    Diabetes Maternal Aunt    Heart disease Maternal Aunt    Diabetes Maternal Uncle    Early death Maternal Uncle    Breast cancer Cousin 54       mat cousin    SOCIAL HISTORY: Social History   Socioeconomic History   Marital status: Married    Spouse name: Keslynn Colombini   Number of children: 1   Years of education: 20   Highest education level: Doctorate  Occupational History   Not on file  Tobacco Use   Smoking status: Never   Smokeless tobacco: Never  Vaping Use   Vaping status: Never Used  Substance and Sexual Activity   Alcohol use: Not Currently    Alcohol/week: 1.0 standard drink of alcohol    Types: 1 Standard drinks or equivalent per week   Drug use: Never   Sexual activity: Not Currently    Partners: Male  Other Topics Concern   Not on file  Social History Narrative   Not on file   Social Determinants of Health   Financial Resource Strain: Low Risk  (08/17/2022)   Overall Financial Resource Strain (CARDIA)    Difficulty of Paying Living Expenses: Not hard at all  Food Insecurity: No Food Insecurity (08/17/2022)   Hunger Vital Sign    Worried About Running Out of Food in the Last Year: Never true    Ran Out of Food in the Last Year: Never true  Transportation Needs: No  Transportation Needs (08/17/2022)   PRAPARE - Administrator, Civil Service (Medical): No    Lack of Transportation (Non-Medical): No  Physical Activity: Insufficiently Active (08/17/2022)   Exercise Vital Sign    Days of Exercise per Week: 2 days    Minutes of Exercise per Session: 30 min  Stress: No Stress Concern Present (08/17/2022)   [employer] of Occupational Health - Occupational Stress Questionnaire    Feeling of Stress : Only a little  Social Connections: Socially Integrated (08/17/2022)   Social Connection and Isolation Panel [NHANES]    Frequency of Communication with Friends and Family: More than three times a week  Frequency of Social Gatherings with Friends and Family: Never    Attends Religious Services: More than 4 times per year    Active Member of Clubs or Organizations: Yes    Attends Engineer, structural: More than 4 times per year    Marital Status: Married    MEDICATIONS:  Current Outpatient Medications  Medication Sig Dispense Refill   acetaminophen (TYLENOL) 500 MG tablet Take 1,000 mg by mouth 2 (two) times daily. As needed     amLODipine (NORVASC) 10 MG tablet Take 1 tablet by mouth once daily 90 tablet 1   Apoaequorin (PREVAGEN PO) Take by mouth.     Calcium Carbonate-Vit D-Min (CALCIUM 1200 PO) Take 2 each by mouth daily.     Cholecalciferol (VITAMIN D) 2000 UNITS CAPS Take 1 capsule (2,000 Units total) by mouth daily. 30 capsule    clotrimazole-betamethasone (LOTRISONE) cream Apply 1 Application topically daily. To feet and rash under breasts 45 g 3   Continuous Glucose Receiver (FREESTYLE LIBRE 3 READER) DEVI Use to monitor glucose levels 1 each 0   Continuous Glucose Sensor (FREESTYLE LIBRE 3 PLUS SENSOR) MISC 1 each by Does not apply route continuous. Change every 15 days. 6 each 3   Continuous Glucose Sensor (FREESTYLE LIBRE 3 SENSOR) MISC Place 1 sensor on the skin every 14 days. Use to check glucose continuously 6 each 3    gabapentin (NEURONTIN) 100 MG capsule Take 1-3 capsules (100-300 mg total) by mouth at bedtime. Follow written titration schedule. 90 capsule 0   GINKGO BILOBA COMPLEX PO Take 1 capsule by mouth daily.     Glucagon (GVOKE HYPOPEN 1-PACK) 1 MG/0.2ML SOAJ Inject 1 mg into the skin as needed (low blood sugar with impaired consciousness). 0.4 mL 2   glucose blood (ONETOUCH VERIO) test strip USE 1 STRIP TO CHECK GLUCOSE 4 TIMES DAILY 300 each 0   insulin aspart (NOVOLOG) 100 UNIT/ML injection USE A MAX OF 90 UNITS DAILY VIA INSULIN PUMP 30 mL 5   Insulin Disposable Pump (V-GO 20) 20 UNIT/24HR KIT 1 each by Other route as directed. 1  ONCE DAILY 30 kit 3   Insulin Pen Needle (PEN NEEDLES) 32G X 5 MM MISC Please 50 each 0   losartan (COZAAR) 100 MG tablet Take 1 tablet by mouth once daily 90 tablet 3   meloxicam (MOBIC) 15 MG tablet Take 1 tablet by mouth once daily 30 tablet 0   NON FORMULARY CPAP @@ 9 cm H2O nightly     nystatin cream (MYCOSTATIN) Apply 1 Application topically 2 (two) times daily.     pravastatin (PRAVACHOL) 20 MG tablet Take 1 tablet by mouth once daily 90 tablet 2   triamcinolone cream (KENALOG) 0.5 % Apply 1 Application topically 3 (three) times daily. To rash on neck 30 g 0   No current facility-administered medications for this visit.    PHYSICAL EXAM: Vitals:   02/13/23 1112  BP: 118/60  Pulse: 68  Resp: 20  SpO2: 97%  Weight: 158 lb 3.2 oz (71.8 kg)  Height: 5\' 2"  (1.575 m)   Body mass index is 28.94 kg/m.  Wt Readings from Last 3 Encounters:  02/13/23 158 lb 3.2 oz (71.8 kg)  01/16/23 155 lb (70.3 kg)  01/02/23 155 lb 3.2 oz (70.4 kg)    General: Well developed, well nourished female in no apparent distress.  HEENT: AT/[place], no external lesions.  Eyes: Conjunctiva clear and no icterus. Neck: Neck supple  Lungs: Respirations not labored Neurologic:  Alert, oriented, normal speech Extremities / Skin: Dry. No sores or rashes noted.  Psychiatric: Does not appear  depressed or anxious  Diabetic Foot Exam - Simple   No data filed    LABS Reviewed Lab Results  Component Value Date   HGBA1C 7.4 (A) 02/13/2023   HGBA1C 7.6 11/02/2022   HGBA1C 7.6 (A) 08/29/2022   Lab Results  Component Value Date   FRUCTOSAMINE 281 11/02/2017   Lab Results  Component Value Date   CHOL 178 11/02/2022   HDL 70.70 11/02/2022   LDLCALC 96 11/02/2022   TRIG 56.0 11/02/2022   CHOLHDL 3 11/02/2022   Lab Results  Component Value Date   MICRALBCREAT 0.7 11/02/2022   MICRALBCREAT 1.4 11/22/2021   Lab Results  Component Value Date   CREATININE 0.74 11/02/2022   Lab Results  Component Value Date   GFR 77.65 11/02/2022    ASSESSMENT / PLAN  1. Uncontrolled type 2 diabetes mellitus with hyperglycemia, with long-term current use of insulin (HCC)     Diabetes Mellitus type 2, complicated by diabetic neuropathy. - Diabetic status / severity: Uncontrolled/fair control.  Lab Results  Component Value Date   HGBA1C 7.4 (A) 02/13/2023    - Hemoglobin A1c goal : <7.5%  Patient does not want to continue Ozempic anymore.  She is currently on Ozempic 1 mg weekly.  - Medications:  -Continue V-Go pump 20 units basal  - Click on V-go pump 0-2 clicks for breakfast, 0-2 clicks for lunch and 3-4 clicks for supper.  - Stop Ozempic.    Take glucose tablet over the counter to correct low glucose.   - Home glucose testing: Continue CGM Freestyle libre 2+.  And check as needed. - Discussed/ Gave Hypoglycemia treatment plan.  Glucagon Emergency Kit prescribed.  Advised to use glucose tablet to correct hypoglycemia.  # Consult : not required at this time.   # Annual urine for microalbuminuria/ creatinine ratio, no microalbuminuria currently, continue ACE/ARB /losartan. Last  Lab Results  Component Value Date   MICRALBCREAT 0.7 11/02/2022    # Foot check nightly / neuropathy, continue gabapentin.  # Annual dilated diabetic eye exams.   - Diet: Make healthy  diabetic food choices - Life style / activity / exercise: Discussed.  2. Blood pressure  -  BP Readings from Last 1 Encounters:  02/13/23 118/60    - Control is in target.  - No change in current plans.  3. Lipid status / Hyperlipidemia - Last  Lab Results  Component Value Date   LDLCALC 96 11/02/2022   - Continue pravastatin.  Diagnoses and all orders for this visit:  Uncontrolled type 2 diabetes mellitus with hyperglycemia, with long-term current use of insulin (HCC) -     POCT glycosylated hemoglobin (Hb A1C)  Other orders -     Glucagon (GVOKE HYPOPEN 1-PACK) 1 MG/0.2ML SOAJ; Inject 1 mg into the skin as needed (low blood sugar with impaired consciousness). -     Continuous Glucose Sensor (FREESTYLE LIBRE 3 PLUS SENSOR) MISC; 1 each by Does not apply route continuous. Change every 15 days.    DISPOSITION Follow up in clinic in 3  months suggested.   All questions answered and patient verbalized understanding of the plan.  Iraq Kazuo Durnil, MD [facility] [address] [place], [address] [place], [place] [address] Phone # [phone]  At least part of this note was generated using voice recognition software. Inadvertent word errors may have occurred, which were not  recognized during the proofreading process.

## 2023-02-13 NOTE — Patient Instructions (Addendum)
Click on V-go pump 0-2 clicks for breakfast, 0-2 clicks for lunch and 3-4 clicks for supper.  Stop Ozempic.    Take glucose tablet over the counter to correct low glucose.

## 2023-02-14 ENCOUNTER — Encounter: Payer: Self-pay | Admitting: Endocrinology

## 2023-02-14 ENCOUNTER — Telehealth: Payer: Self-pay | Admitting: Endocrinology

## 2023-02-14 ENCOUNTER — Other Ambulatory Visit: Payer: Self-pay

## 2023-02-14 NOTE — Telephone Encounter (Signed)
Patient left a message on the Night service advising she is out of sensors. Please advise patient when done

## 2023-02-14 NOTE — Telephone Encounter (Signed)
Patient call returned to clarify which sensors needed for refill

## 2023-02-15 ENCOUNTER — Other Ambulatory Visit: Payer: Self-pay

## 2023-02-15 DIAGNOSIS — E1165 Type 2 diabetes mellitus with hyperglycemia: Secondary | ICD-10-CM

## 2023-02-15 MED ORDER — FREESTYLE LIBRE 2 SENSOR MISC: 2 refills | Status: DC

## 2023-02-16 ENCOUNTER — Inpatient Hospital Stay: Payer: HMO | Admitting: Internal Medicine

## 2023-02-16 ENCOUNTER — Telehealth: Payer: Self-pay | Admitting: Internal Medicine

## 2023-02-16 NOTE — Telephone Encounter (Signed)
Can a referral be placed?  KP

## 2023-02-16 NOTE — Telephone Encounter (Signed)
Copied from CRM [id number]. Topic: Referral - Request for Referral >> Feb 16, 2023  8:56 AM Everette C wrote: Did the patient discuss referral with their provider in the last year? No (If No - schedule appointment) (If Yes - send message)  Appointment offered? No  Type of order/referral and detailed reason for visit: Endocrinology / Diabetic concern   Preference of office, provider, location: Dr. Verdis Frederickson / [facility]   If referral order, have you been seen by this specialty before? Yes (If Yes, this issue or another issue? When? Where?  Can we respond through MyChart? No

## 2023-02-16 NOTE — Telephone Encounter (Signed)
Tried calling patient to ask why she is wanting to see a different Endo. No VM, could not leave message.

## 2023-02-19 ENCOUNTER — Ambulatory Visit (INDEPENDENT_AMBULATORY_CARE_PROVIDER_SITE_OTHER): Payer: HMO | Admitting: Internal Medicine

## 2023-02-19 ENCOUNTER — Encounter: Payer: Self-pay | Admitting: Internal Medicine

## 2023-02-19 ENCOUNTER — Telehealth: Payer: Self-pay | Admitting: Internal Medicine

## 2023-02-19 VITALS — BP 128/74 | HR 69 | Ht 62.0 in | Wt 156.2 lb

## 2023-02-19 DIAGNOSIS — S0219XD Other fracture of base of skull, subsequent encounter for fracture with routine healing: Secondary | ICD-10-CM

## 2023-02-19 DIAGNOSIS — R55 Syncope and collapse: Secondary | ICD-10-CM

## 2023-02-19 NOTE — Telephone Encounter (Signed)
Copied from CRM [id number]. Topic: Referral - Status >> Feb 19, 2023  3:13 PM Franchot Heidelberg wrote: Reason for CRM: Pt called following up on request from Friday. Please advise   Communication Did the patient discuss referral with their provider in the last year? No Appointment offered? No Type of order/referral and detailed reason for visit: Endocrinology / Diabetic concern Preference of office, provider, location: Dr. Verdis Frederickson / [facility] If referral order, have you been seen by this specialty before? Yes Can we respond through MyChart? No >> Feb 19, 2023  3:18 PM Franchot Heidelberg wrote: Pt states she "does not want to drive to [place] when she does not have to"

## 2023-02-19 NOTE — Progress Notes (Signed)
Date:  02/19/2023   Name:  Melinda Ford   DOB:  1945-07-17   MRN:  [id number]   Chief Complaint: Hospitalization Follow-up ED follow up from syncope at [facility]  02/11/23.  History of Present Illness HPI Patient is a 77 year old female with a history of diabetes, HLD, GERD presenting following ground-level fall. Patient's husband and patient report that earlier today patient had been experiencing flulike symptoms including fevers, rhinorrhea, congestion, and cough. They went to bed and patient's husband woke up to the sound of a loud thud and saw his wife on the ground next to their bed. Patient was unresponsive despite multiple attempts to stimulate the patient. EMS was then called in as EMS was coming to the home patient began moaning and became increasingly responsive. By EMS arrival patient was confused although able to sit up. Patient is still experiencing some amount of confusion now, does not recall having fallen, nor does she recall the series of events prior to the fall. Patient denies any recent chest pain, difficulty breathing, lightheadedness, palpitations, nausea, vomiting, abdominal pain.    CT Cervical spine: IMPRESSION:  1.  No acute fracture or traumatic listhesis of the cervical spine.  2.  Redemonstrated nondisplaced fracture of the squamous portion of the  right temporal bone extending into the right temporomandibular joint.  3.  Moderate to advanced multilevel cervical spondylosis resulting in at  least moderate spinal canal stenosis at C4-C5 and multilevel moderate to  severe neuroforaminal stenoses.   CT Brain: IMPRESSION:  1.  No acute intracranial injury.  2.  Age-indeterminate nondisplaced fracture of the squamous portion of the  right temporal bone extending into the right temporomandibular joint.  Correlate with exam.   EKG: Normal sinus rhythm  Left atrial enlargement  Left ventricular hypertrophy  Nonspecific T wave abnormalities, lateral leads   Abnormal ECG   ECHO: CONCLUSION -------------------------------------------------------------------------------  NORMAL LEFT VENTRICULAR SYSTOLIC FUNCTION WITH MODERATE LVH  ESTIMATED EF: >55%  NORMAL LA PRESSURES WITH NORMAL DIASTOLIC FUNCTION  NORMAL RIGHT VENTRICULAR SYSTOLIC FUNCTION  VALVULAR REGURGITATION: No AR, TRIVIAL MR, No PR, TRIVIAL TR  NO VALVULAR STENOSIS   Holter monitor placed.  Results still pending.   Headache  This is a new problem. The current episode started in the past 7 days. The pain is located in the Right unilateral region. The pain does not radiate. The quality of the pain is described as aching. The pain is mild. Pertinent negatives include no fever.   She has sent in the monitor but apparently not resulted.  I will likely not be able to see the report so [facility] should be calling her.    Review of Systems  Constitutional:  Negative for chills, fatigue and fever.  Respiratory:  Negative for chest tightness, shortness of breath and wheezing.   Cardiovascular:  Negative for chest pain and palpitations.  Neurological:  Positive for headaches.  Psychiatric/Behavioral:  Negative for dysphoric mood and sleep disturbance. The patient is not nervous/anxious.      Lab Results  Component Value Date   NA 140 11/02/2022   K 4.4 11/02/2022   CO2 28 11/02/2022   GLUCOSE 88 11/02/2022   BUN 21 11/02/2022   CREATININE 0.74 11/02/2022   CALCIUM 10.4 11/02/2022   EGFR 82 07/01/2021   GFRNONAA 98 09/10/2015   Lab Results  Component Value Date   CHOL 178 11/02/2022   HDL 70.70 11/02/2022   LDLCALC 96 11/02/2022   TRIG 56.0 11/02/2022   CHOLHDL 3  11/02/2022   Lab Results  Component Value Date   TSH 1.650 12/28/2020   Lab Results  Component Value Date   HGBA1C 7.4 (A) 02/13/2023   Lab Results  Component Value Date   WBC 5.2 07/01/2021   HGB 14.5 07/01/2021   HCT 44.4 07/01/2021   MCV 93 07/01/2021   PLT 284 07/01/2021   Lab Results  Component  Value Date   ALT 35 11/02/2022   AST 30 11/02/2022   ALKPHOS 83 11/02/2022   BILITOT 0.6 11/02/2022   Lab Results  Component Value Date   VD25OH 34.0 12/28/2020     Patient Active Problem List   Diagnosis Date Noted   Trigger ring finger of right hand 07/23/2022   Cervical radicular pain (left C5,6) 03/17/2021   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/2021   Chronic pain syndrome 03/17/2021   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/2021   Cervical paraspinal muscle spasm 02/10/2021   Gastroesophageal reflux disease 12/28/2020   History of colonic polyps    Polyp of sigmoid colon    Abdominal pain, epigastric    Patient noncompliant with statin medication 09/12/2019   Type II diabetes mellitus with complication (HCC) 06/04/2019   Hyperlipidemia associated with type 2 diabetes mellitus (HCC) 05/17/2018   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 02/22/2018   Chronic midline thoracic back pain 02/22/2018   Tinea versicolor 09/20/2016   OSA (obstructive sleep apnea) 08/23/2016   Allergic rhinitis 07/13/2016   Overweight (BMI 25.0-29.9) 10/01/2015   Vitamin D deficiency 09/18/2014   Onychomycosis 09/16/2014   Degenerative disc disease, lumbar 09/16/2014   Benign essential HTN 10/10/2010   Osteoporosis 10/10/2010    Allergies  Allergen Reactions   Ace Inhibitors Cough   Wheat Hives   Gramineae Pollens     Past Surgical History:  Procedure Laterality Date   ABDOMINAL HYSTERECTOMY     CATARACT EXTRACTION Right 2020   COLONOSCOPY WITH PROPOFOL N/A 10/10/2019   Procedure: COLONOSCOPY WITH PROPOFOL;  Surgeon: Midge Minium, MD;  Location: [facility];  Service: Endoscopy;  Laterality: N/A;   ESOPHAGOGASTRODUODENOSCOPY (EGD) WITH PROPOFOL N/A 10/10/2019   Procedure: ESOPHAGOGASTRODUODENOSCOPY (EGD) WITH PROPOFOL;  Surgeon: Midge Minium, MD;  Location: [facility];  Service: Endoscopy;  Laterality: N/A;  Diabetic - insulin pump and oral  meds sleep apnea   POLYPECTOMY  10/10/2019   Procedure: POLYPECTOMY;  Surgeon: Midge Minium, MD;  Location: [facility];  Service: Endoscopy;;    Social History   Tobacco Use   Smoking status: Never   Smokeless tobacco: Never  Vaping Use   Vaping status: Never Used  Substance Use Topics   Alcohol use: Not Currently    Alcohol/week: 1.0 standard drink of alcohol    Types: 1 Standard drinks or equivalent per week   Drug use: Never     Medication list has been reviewed and updated.  Current Meds  Medication Sig   acetaminophen (TYLENOL) 500 MG tablet Take 1,000 mg by mouth 2 (two) times daily. As needed   amLODipine (NORVASC) 10 MG tablet Take 1 tablet by mouth once daily   Apoaequorin (PREVAGEN PO) Take by mouth.   Calcium Carbonate-Vit D-Min (CALCIUM 1200 PO) Take 2 each by mouth daily.   Cholecalciferol (VITAMIN D) 2000 UNITS CAPS Take 1 capsule (2,000 Units total) by mouth daily.   clotrimazole-betamethasone (LOTRISONE) cream Apply 1 Application topically daily. To feet and rash under breasts   Continuous Glucose Receiver (FREESTYLE LIBRE 3 READER) DEVI Use to  monitor glucose levels   Continuous Glucose Sensor (FREESTYLE LIBRE 2 SENSOR) MISC Place 1 sensor on the skin every 14 days. Use to check glucose continuously   Continuous Glucose Sensor (FREESTYLE LIBRE 3 PLUS SENSOR) MISC 1 each by Does not apply route continuous. Change every 15 days.   Continuous Glucose Sensor (FREESTYLE LIBRE 3 SENSOR) MISC Place 1 sensor on the skin every 14 days. Use to check glucose continuously   gabapentin (NEURONTIN) 100 MG capsule Take 1-3 capsules (100-300 mg total) by mouth at bedtime. Follow written titration schedule.   GINKGO BILOBA COMPLEX PO Take 1 capsule by mouth daily.   Glucagon (GVOKE HYPOPEN 1-PACK) 1 MG/0.2ML SOAJ Inject 1 mg into the skin as needed (low blood sugar with impaired consciousness).   glucose blood (ONETOUCH VERIO) test strip USE 1 STRIP TO CHECK GLUCOSE 4  TIMES DAILY   insulin aspart (NOVOLOG) 100 UNIT/ML injection USE A MAX OF 90 UNITS DAILY VIA INSULIN PUMP   Insulin Disposable Pump (V-GO 20) 20 UNIT/24HR KIT 1 each by Other route as directed. 1  ONCE DAILY   Insulin Pen Needle (PEN NEEDLES) 32G X 5 MM MISC Please   losartan (COZAAR) 100 MG tablet Take 1 tablet by mouth once daily   meloxicam (MOBIC) 15 MG tablet Take 1 tablet by mouth once daily   NON FORMULARY CPAP @@ 9 cm H2O nightly   nystatin cream (MYCOSTATIN) Apply 1 Application topically 2 (two) times daily.   pravastatin (PRAVACHOL) 20 MG tablet Take 1 tablet by mouth once daily   triamcinolone cream (KENALOG) 0.5 % Apply 1 Application topically 3 (three) times daily. To rash on neck       02/19/2023    2:08 PM 01/02/2023    2:51 PM 11/29/2022    8:47 AM 11/01/2022   11:27 AM  GAD 7 : Generalized Anxiety Score  Nervous, Anxious, on Edge 0 0 1 0  Control/stop worrying 0 1 0 0  Worry too much - different things 1 0 1 0  Trouble relaxing 0 0 0 0  Restless 0 0 0 0  Easily annoyed or irritable 0 0 0 0  Afraid - awful might happen 0 0 0 0  Total GAD 7 Score 1 1 2  0  Anxiety Difficulty Not difficult at all Not difficult at all Not difficult at all Not difficult at all       02/19/2023    2:08 PM 01/16/2023   11:39 AM 01/02/2023    2:51 PM  Depression screen PHQ 2/9  Decreased Interest 0 0 0  Down, Depressed, Hopeless 0 0 0  PHQ - 2 Score 0 0 0  Altered sleeping 0  0  Tired, decreased energy 1  1  Change in appetite 0  1  Feeling bad or failure about yourself  0  0  Trouble concentrating 1  1  Moving slowly or fidgety/restless 0  1  Suicidal thoughts 0  0  PHQ-9 Score 2  4  Difficult doing work/chores Not difficult at all  Not difficult at all    BP Readings from Last 3 Encounters:  02/19/23 128/74  02/13/23 118/60  01/16/23 (!) 183/72    Physical Exam Vitals and nursing note reviewed.  Constitutional:      General: She is not in acute distress.     Appearance: Normal appearance. She is well-developed.  HENT:     Head: Normocephalic and atraumatic.      Comments: Tenderness, no deformity or swelling  Cardiovascular:     Rate and Rhythm: Normal rate and regular rhythm.  Pulmonary:     Effort: Pulmonary effort is normal. No respiratory distress.     Breath sounds: No wheezing or rhonchi.  Musculoskeletal:     Cervical back: Normal range of motion.  Lymphadenopathy:     Cervical: No cervical adenopathy.  Skin:    General: Skin is warm and dry.     Findings: No rash.  Neurological:     Mental Status: She is alert and oriented to person, place, and time.  Psychiatric:        Mood and Affect: Mood normal.        Behavior: Behavior normal.     Wt Readings from Last 3 Encounters:  02/19/23 156 lb 3.2 oz (70.9 kg)  02/13/23 158 lb 3.2 oz (71.8 kg)  01/16/23 155 lb (70.3 kg)    BP 128/74   Pulse 69   Ht 5\' 2"  (1.575 m)   Wt 156 lb 3.2 oz (70.9 kg)   SpO2 97%   BMI 28.57 kg/m   Assessment and Plan:  Problem List Items Addressed This Visit   None Visit Diagnoses     Vasovagal syncope    -  Primary   secondary to acute viral syndrome viral symptoms now resolved gradually increase activity level as tolerated   Closed fracture of temporal bone with routine healing, subsequent encounter       patient reassured - should resolve without sequelae       No follow-ups on file.    Reubin Milan, MD [facility] [place]

## 2023-02-19 NOTE — Telephone Encounter (Signed)
Tried calling patient back. NO VM and could not leave message. Dr Judithann Graves would like to know why patient is wanting to switch Endo doctors when she is already established with one.  - Melinda Ford

## 2023-02-20 ENCOUNTER — Ambulatory Visit: Payer: PPO | Admitting: Student in an Organized Health Care Education/Training Program

## 2023-02-20 DIAGNOSIS — R55 Syncope and collapse: Secondary | ICD-10-CM | POA: Diagnosis not present

## 2023-02-21 ENCOUNTER — Other Ambulatory Visit: Payer: Self-pay

## 2023-02-21 DIAGNOSIS — E118 Type 2 diabetes mellitus with unspecified complications: Secondary | ICD-10-CM

## 2023-02-21 NOTE — Telephone Encounter (Signed)
Patient called the office back and spoke with me. Stated her ENDO doctor in [place] has left the practice and she would like a closer Endo to home.   Referred patient to [facility] as she requested.  - Verdella Laidlaw

## 2023-02-22 ENCOUNTER — Ambulatory Visit
Payer: PPO | Attending: Student in an Organized Health Care Education/Training Program | Admitting: Student in an Organized Health Care Education/Training Program

## 2023-02-22 ENCOUNTER — Encounter: Payer: Self-pay | Admitting: Student in an Organized Health Care Education/Training Program

## 2023-02-22 VITALS — BP 167/84 | HR 57 | Temp 97.2°F | Resp 16 | Ht 62.0 in | Wt 158.0 lb

## 2023-02-22 DIAGNOSIS — M5416 Radiculopathy, lumbar region: Secondary | ICD-10-CM | POA: Diagnosis not present

## 2023-02-22 DIAGNOSIS — M5441 Lumbago with sciatica, right side: Secondary | ICD-10-CM | POA: Insufficient documentation

## 2023-02-22 DIAGNOSIS — M5116 Intervertebral disc disorders with radiculopathy, lumbar region: Secondary | ICD-10-CM | POA: Diagnosis not present

## 2023-02-22 DIAGNOSIS — G8929 Other chronic pain: Secondary | ICD-10-CM | POA: Diagnosis not present

## 2023-02-22 NOTE — Progress Notes (Signed)
Safety precautions to be maintained throughout the outpatient stay will include: orient to surroundings, keep bed in low position, maintain call bell within reach at all times, provide assistance with transfer out of bed and ambulation.  

## 2023-02-22 NOTE — Patient Instructions (Signed)
  ______________________________________________________________________    Procedure instructions  Stop blood-thinners  Do not eat or drink fluids (other than water) for 6 hours before your procedure  No water for 2 hours before your procedure  Take your blood pressure medicine with a sip of water  Arrive 30 minutes before your appointment  If sedation is planned, bring suitable driver. Pennie Banter, Benedetto Goad, & public transportation are NOT APPROVED)  Carefully read the "Preparing for your procedure" detailed instructions  If you have questions call us at [phone]  ______________________________________________________________________

## 2023-02-22 NOTE — Progress Notes (Signed)
PROVIDER NOTE: Information contained herein reflects review and annotations entered in association with encounter. Interpretation of such information and data should be left to medically-trained personnel. Information provided to patient can be located elsewhere in the medical record under "Patient Instructions". Document created using STT-dictation technology, any transcriptional errors that may result from process are unintentional.    Patient: Melinda Ford  Service Category: E/M  Provider: Edward Jolly, MD  DOB: 1946/03/09  DOS: 02/22/2023  Referring Provider: Reubin Milan, MD  MRN: [id number]  Specialty: Interventional Pain Management  PCP: Reubin Milan, MD  Type: Established Patient  Setting: Ambulatory outpatient    Location: Office  Delivery: Face-to-face     HPI  [name], a 77 y.o. 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 Melinda Ford primary complain today is Back Pain (Lumbar right ), Hip Pain (Right ), and Hand Pain (Left hand? Coming from neck )  Pertinent problems: Ms. Feather has Cervical spondylosis with radiculopathy; Cervical paraspinal muscle spasm; Cervical radicular pain (left C5,6); Foraminal stenosis of cervical region; and Chronic pain syndrome on their pertinent problem list. Pain Assessment: Severity of Chronic pain is reported as a 2 /10. Location: Back (right hip) Lower, Right/sometimes right leg gets weak. Onset: More than a month ago. Quality: Aching, Constant, Discomfort. Timing: Constant. Modifying factor(s): tylenol, meloxicam, gabapentin. Vitals:  height is 5\' 2"  (1.575 m) and weight is 158 lb (71.7 kg). Her temporal temperature is 97.2 F (36.2 C) (abnormal). Her blood pressure is 167/84 (abnormal) and her pulse is 57 (abnormal). Her respiration is 16 and oxygen saturation is 100%.  BMI: Estimated body mass index is 28.9 kg/m as calculated from the following:   Height as of this  encounter: 5\' 2"  (1.575 m).   Weight as of this encounter: 158 lb (71.7 kg). Last encounter: 01/16/2023. Last procedure: Visit date not found.  Reason for encounter:  Discussed the use of AI scribe software for clinical note transcription with the patient, who gave verbal consent to proceed.  History of Present Illness   The patient, with a history of diabetes and back pain, presents with concerns following a recent hospital admission due to a fainting episode. The event occurred on November 9th. The patient reports confusion regarding the cause of the fainting spell, as the heart monitor results have not been discussed. The patient's spouse witnessed the event, describing the patient as unresponsive and unconscious on the floor. The patient gradually regained consciousness with paramedic assistance. The patient also reported feeling unwell and having a slight fever on the day of the fainting episode.  The patient continues to experience daily pain in the lower back and right hip, which is managed with Tylenol and Meloxicam. The pain is located in the lower back, extending to the right hip, and is described as being at the point 'where the butt almost separates.' The patient reports that the pain can be alleviated with Tylenol. An MRI of the right hip revealed tendonitis and some degeneration, but no tears. The patient's pain is believed to be originating from the spine, specifically at L4, L5 where there is a disc bulge impacting the right L5 nerve root. This is causing pain to radiate down the right leg and into the foot, described as an 'electrical sensation.'  The patient also expresses concern about her gait, stating that she has to be more careful when walking due to the pain. The patient is considering an  epidural injection for pain management but is currently managing the pain with Gabapentin, which she plans to take more consistently.  The patient also has diabetes and is on insulin. She  expresses concern about the risk of a silent myocardial infarction, given her diabetic status. The patient is not currently on a daily aspirin regimen but is taking Pravastatin as recommended by her endocrinologist.        ROS  Constitutional: Denies any fever or chills Gastrointestinal: No reported hemesis, hematochezia, vomiting, or acute GI distress Musculoskeletal:  As above Neurological: No reported episodes of acute onset apraxia, aphasia, dysarthria, agnosia, amnesia, paralysis, loss of coordination, or loss of consciousness  Medication Review  Apoaequorin, Calcium Carbonate-Vit D-Min, FreeStyle Libre 2 Sensor, FreeStyle Libre 3 Plus Sensor, [employer] 3 Reader, [employer] 3 Sensor, Ginkgo Biloba, Glucagon, NON FORMULARY, Pen Needles, V-Go 20, Vitamin D, acetaminophen, amLODipine, clotrimazole-betamethasone, gabapentin, glucose blood, insulin aspart, losartan, meloxicam, nystatin cream, pravastatin, and triamcinolone cream  History Review  Allergy: Melinda Ford is allergic to ace inhibitors, wheat, and gramineae pollens. Drug: Melinda Ford  reports no history of drug use. Alcohol:  reports that she does not currently use alcohol after a past usage of about 1.0 standard drink of alcohol per week. Tobacco:  reports that she has never smoked. She has never used smokeless tobacco. Social: Melinda Ford  reports that she has never smoked. She has never used smokeless tobacco. She reports that she does not currently use alcohol after a past usage of about 1.0 standard drink of alcohol per week. She reports that she does not use drugs. Medical:  has a past medical history of Allergy, Arthritis, Cataract, Diabetes mellitus without complication (HCC), Hyperlipidemia, Hypertension, Motion sickness, OSA (obstructive sleep apnea) (08/23/2016), Osteoporosis, Presence of dental prosthetic device, Radiculopathy of lumbosacral region (06/22/2016), Right lateral epicondylitis (06/04/2017), Right sided  sciatica (03/09/2016), and Sleep apnea. Surgical: Melinda Ford  has a past surgical history that includes Abdominal hysterectomy; Cataract extraction (Right, 2020); Colonoscopy with propofol (N/A, 10/10/2019); Esophagogastroduodenoscopy (egd) with propofol (N/A, 10/10/2019); and polypectomy (10/10/2019). Family: family history includes Alcohol abuse in her father; Asthma in her brother; Breast cancer (age of onset: 60) in an other family member; Breast cancer (age of onset: 82) in her cousin; Diabetes in her brother, maternal aunt, maternal uncle, and sister; Early death in her maternal uncle; Heart disease in her maternal aunt; Mental illness in her father; Stroke in her father.  Laboratory Chemistry Profile   Renal Lab Results  Component Value Date   BUN 21 11/02/2022   CREATININE 0.74 11/02/2022   BCR 25 07/01/2021   GFR 77.65 11/02/2022   GFRAA 113 09/10/2015   GFRNONAA 98 09/10/2015    Hepatic Lab Results  Component Value Date   AST 30 11/02/2022   ALT 35 11/02/2022   ALBUMIN 4.1 11/02/2022   ALKPHOS 83 11/02/2022   LIPASE 27 07/01/2021    Electrolytes Lab Results  Component Value Date   NA 140 11/02/2022   K 4.4 11/02/2022   CL 106 11/02/2022   CALCIUM 10.4 11/02/2022    Bone Lab Results  Component Value Date   VD25OH 34.0 12/28/2020    Inflammation (CRP: Acute Phase) (ESR: Chronic Phase) Lab Results  Component Value Date   ESRSEDRATE 12 01/15/2015         Note: Above Lab results reviewed.  Recent Imaging Review  MR HIP RIGHT WO CONTRAST CLINICAL DATA:  Chronic right hip pain.  EXAM: MR OF THE RIGHT HIP WITHOUT  CONTRAST  TECHNIQUE: Multiplanar, multisequence MR imaging was performed. No intravenous contrast was administered.  COMPARISON:  Radiographs 03/13/2022  FINDINGS: Both hips are normally located. No stress fracture or AVN. Mild age related degenerative changes with cartilage thinning, joint space narrowing and mild spurring.  No joint effusion. No  periarticular fluid collections to suggest a paralabral cyst. Labral degenerative changes without obvious tear.  The pubic symphysis and SI joints are intact. No pelvic fractures or bone lesions.  Mild bilateral peritrochanteric tendinosis, right greater than left. No tendon tear or trochanteric bursitis. The hip and pelvic musculature are unremarkable. No muscle tear, myositis or mass. The hamstring tendons are intact. No significant tendinopathy.  No significant intrapelvic abnormalities. No pelvic mass or adenopathy. No inguinal adenopathy or hernia.  IMPRESSION: 1. Mild age related degenerative changes involving both hips. No stress fracture or AVN. 2. Labral degenerative changes without obvious tear. 3. Mild bilateral peritrochanteric tendinosis, right greater than left. No tendon tear or trochanteric bursitis. 4. No significant intrapelvic abnormalities.  Electronically Signed   By: Rudie Meyer M.D.   On: 02/21/2023 10:45 Note: Reviewed        Physical Exam  General appearance: Well nourished, well developed, and well hydrated. In no apparent acute distress Mental status: Alert, oriented x 3 (person, place, & time)       Respiratory: No evidence of acute respiratory distress Eyes: PERLA Vitals: BP (!) 167/84 (BP Location: Right Arm, Patient Position: Sitting, Cuff Size: Normal) Comment: did not take BP medicine this morning  Pulse (!) 57   Temp (!) 97.2 F (36.2 C) (Temporal)   Resp 16   Ht 5\' 2"  (1.575 m)   Wt 158 lb (71.7 kg)   SpO2 100%   BMI 28.90 kg/m  BMI: Estimated body mass index is 28.9 kg/m as calculated from the following:   Height as of this encounter: 5\' 2"  (1.575 m).   Weight as of this encounter: 158 lb (71.7 kg). Ideal: Ideal body weight: 50.1 kg (110 lb 7.2 oz) Adjusted ideal body weight: 58.7 kg (129 lb 7.5 oz)  Low back pain with radiation into the right hip and right buttock   Assessment   Diagnosis Status  1. Lumbar disc herniation  with radiculopathy (RIGHT L5 NERVE)   2. Chronic right-sided low back pain with right-sided sciatica   3. Lumbar radiculopathy    Controlled Controlled Controlled   Updated Problems: Problem  Lumbar Radiculopathy  Chronic Right-Sided Low Back Pain With Right-Sided Sciatica   IMO SNOMED Dx Update Oct 2024     Plan of Care  Problem-specific:  Assessment and Plan    Lumbar Disc Bulge with Sciatica   A disc bulge at L4-L5 impacts the right L5 nerve root, causing pain radiating down the right leg, consistent with sciatica, including electrical sensations and pain affecting gait and mobility. We will recommend an epidural steroid injection along the right L5 nerve to reduce pain and improve mobility, although the first injection may not guarantee complete relief. If gabapentin 100-200 mg at night does not effectively manage the pain by the end of December, we will schedule an epidural injection. Alternative treatments include physical therapy.  Right Hip Tendonitis and Degeneration   She exhibits chronic pain in the right hip and lower back, exacerbated by movement, with MRI showing tendonitis and degeneration without tears, consistent with age-related wear and tear. We will continue managing pain with Tylenol and meloxicam.  Syncope   She experienced a fainting episode on November  9th, resulting in a fall and brief unresponsiveness. Possible contributing factors include a slight fever and illness on the day of the event, with orthostatic hypotension as a differential diagnosis, especially given her age and potential delayed vascular response. We await heart monitor results and advise taking time when moving from sitting to standing to prevent orthostatic hypotension.  Diabetes Mellitus   As a diabetic on insulin, she expressed concern about silent myocardial infarction due to diabetes. We advised daily baby aspirin to prevent stroke and heart attacks and to continue pravastatin as prescribed  by her endocrinologist.  General Health Maintenance   Concerned about overall health and the potential for silent myocardial infarction due to diabetes, we advised regular follow-up with her primary care physician and monitoring for any chest pain, seeking immediate medical attention if it occurs.  Follow-up   She should call the clinic to schedule an epidural injection if needed, follow up with her primary care physician for regular check-ups, and monitor and report any new or worsening symptoms.       Melinda Ford has a current medication list which includes the following long-term medication(s): amlodipine, calcium carbonate-vit d-min, gabapentin, insulin aspart, losartan, and pravastatin.  Pharmacotherapy (Medications Ordered): No orders of the defined types were placed in this encounter.  Orders:  Orders Placed This Encounter  Procedures   Lumbar Transforaminal Epidural    Standing Status:   Future    Standing Expiration Date:   05/25/2023    Scheduling Instructions:     Laterality: RIGHT     Level(s): L5            Sedation: Patient's choice.     Timeframe: ASAP    Order Specific Question:   Where will this procedure be performed?    Answer:   [facility]   Follow-up plan:   Return for PRN RIGHT L5 TF ESI- patient will call us to schedule.      Recent Visits Date Type Provider Dept  02/20/23 Appointment Edward Jolly, MD [facility]  01/16/23 Office Visit Edward Jolly, MD [facility]  Showing recent visits within past 90 days and meeting all other requirements Today's Visits Date Type Provider Dept  02/22/23 Office Visit Edward Jolly, MD [facility]  Showing today's visits and meeting all other requirements Future Appointments No visits were found meeting these conditions. Showing future appointments within next 90 days and meeting all other requirements  I discussed the assessment and treatment plan with the patient.  The patient was provided an opportunity to ask questions and all were answered. The patient agreed with the plan and demonstrated an understanding of the instructions.  Patient advised to call back or seek an in-person evaluation if the symptoms or condition worsens.  Duration of encounter: .  Total time on encounter, as per AMA guidelines included both the face-to-face and non-face-to-face time personally spent by the physician and/or other qualified health care professional(s) on the day of the encounter (includes time in activities that require the physician or other qualified health care professional and does not include time in activities normally performed by clinical staff). Physician's time may include the following activities when performed: Preparing to see the patient (e.g., pre-charting review of records, searching for previously ordered imaging, lab work, and nerve conduction tests) Review of prior analgesic pharmacotherapies. Reviewing PMP Interpreting ordered tests (e.g., lab work, imaging, nerve conduction tests) Performing post-procedure evaluations, including interpretation of diagnostic procedures Obtaining and/or reviewing separately obtained  history Performing a medically appropriate examination and/or evaluation Counseling and educating the patient/family/caregiver Ordering medications, tests, or procedures Referring and communicating with other health care professionals (when not separately reported) Documenting clinical information in the electronic or other health record Independently interpreting results (not separately reported) and communicating results to the patient/ family/caregiver Care coordination (not separately reported)  Note by: Edward Jolly, MD Date: 02/22/2023; Time: 11:35 AM

## 2023-02-28 ENCOUNTER — Telehealth: Payer: Self-pay | Admitting: Internal Medicine

## 2023-02-28 NOTE — Telephone Encounter (Signed)
Copied from CRM [id number]. Topic: General - Inquiry >> Feb 28, 2023  1:41 PM De Blanch wrote: Reason for CRM: Pt stated she wanted to ask if Dr. Judithann Graves had received her results reading of her heart monitor. Stated when she last saw PCP, she advised her she had not received the readings, and she is wondering if she has received them.  Please advise.

## 2023-03-05 DIAGNOSIS — B351 Tinea unguium: Secondary | ICD-10-CM | POA: Diagnosis not present

## 2023-03-05 DIAGNOSIS — B353 Tinea pedis: Secondary | ICD-10-CM | POA: Diagnosis not present

## 2023-03-05 DIAGNOSIS — M79675 Pain in left toe(s): Secondary | ICD-10-CM | POA: Diagnosis not present

## 2023-03-05 DIAGNOSIS — M79674 Pain in right toe(s): Secondary | ICD-10-CM | POA: Diagnosis not present

## 2023-03-13 ENCOUNTER — Encounter: Payer: Self-pay | Admitting: Internal Medicine

## 2023-03-13 ENCOUNTER — Ambulatory Visit (INDEPENDENT_AMBULATORY_CARE_PROVIDER_SITE_OTHER): Payer: HMO | Admitting: Internal Medicine

## 2023-03-13 DIAGNOSIS — I10 Essential (primary) hypertension: Secondary | ICD-10-CM | POA: Diagnosis not present

## 2023-03-13 NOTE — Progress Notes (Signed)
Date:  03/13/2023   Name:  Melinda Ford   DOB:  1945/11/25   MRN:  [id number]   Chief Complaint: Hypertension  Hypertension This is a chronic (has been elevated at home and having headaches) problem. The problem is controlled. Associated symptoms include headaches. Pertinent negatives include no chest pain or shortness of breath. Past treatments include calcium channel blockers and angiotensin blockers. The current treatment provides significant improvement. There is no history of kidney disease, CAD/MI or CVA.    Review of Systems  Constitutional:  Negative for chills, fatigue and fever.  Respiratory:  Negative for chest tightness and shortness of breath.   Cardiovascular:  Negative for chest pain and leg swelling.  Gastrointestinal:  Negative for constipation.  Neurological:  Positive for headaches. Negative for dizziness, syncope, speech difficulty and numbness.     Lab Results  Component Value Date   NA 140 11/02/2022   K 4.4 11/02/2022   CO2 28 11/02/2022   GLUCOSE 88 11/02/2022   BUN 21 11/02/2022   CREATININE 0.74 11/02/2022   CALCIUM 10.4 11/02/2022   EGFR 82 07/01/2021   GFRNONAA 98 09/10/2015   Lab Results  Component Value Date   CHOL 178 11/02/2022   HDL 70.70 11/02/2022   LDLCALC 96 11/02/2022   TRIG 56.0 11/02/2022   CHOLHDL 3 11/02/2022   Lab Results  Component Value Date   TSH 1.650 12/28/2020   Lab Results  Component Value Date   HGBA1C 7.4 (A) 02/13/2023   Lab Results  Component Value Date   WBC 5.2 07/01/2021   HGB 14.5 07/01/2021   HCT 44.4 07/01/2021   MCV 93 07/01/2021   PLT 284 07/01/2021   Lab Results  Component Value Date   ALT 35 11/02/2022   AST 30 11/02/2022   ALKPHOS 83 11/02/2022   BILITOT 0.6 11/02/2022   Lab Results  Component Value Date   VD25OH 34.0 12/28/2020     Patient Active Problem List   Diagnosis Date Noted   Lumbar radiculopathy 02/22/2023   Trigger ring finger of right hand 07/23/2022   Cervical  radicular pain (left C5,6) 03/17/2021   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/2021   Chronic pain syndrome 03/17/2021   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/2021   Cervical paraspinal muscle spasm 02/10/2021   Gastroesophageal reflux disease 12/28/2020   History of colonic polyps    Polyp of sigmoid colon    Abdominal pain, epigastric    Patient noncompliant with statin medication 09/12/2019   Type II diabetes mellitus with complication (HCC) 06/04/2019   Hyperlipidemia associated with type 2 diabetes mellitus (HCC) 05/17/2018   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 02/22/2018   Chronic midline thoracic back pain 02/22/2018   Tinea versicolor 09/20/2016   OSA (obstructive sleep apnea) 08/23/2016   Allergic rhinitis 07/13/2016   Overweight (BMI 25.0-29.9) 10/01/2015   Vitamin D deficiency 09/18/2014   Onychomycosis 09/16/2014   Degenerative disc disease, lumbar 09/16/2014   Benign essential HTN 10/10/2010   Chronic right-sided low back pain with right-sided sciatica 10/10/2010   Osteoporosis 10/10/2010    Allergies  Allergen Reactions   Ace Inhibitors Cough   Wheat Hives   Gramineae Pollens     Past Surgical History:  Procedure Laterality Date   ABDOMINAL HYSTERECTOMY     CATARACT EXTRACTION Right 2020   COLONOSCOPY WITH PROPOFOL N/A 10/10/2019   Procedure: COLONOSCOPY WITH PROPOFOL;  Surgeon: Midge Minium, MD;  Location: [facility];  Service: Endoscopy;  Laterality: N/A;   ESOPHAGOGASTRODUODENOSCOPY (EGD) WITH PROPOFOL N/A 10/10/2019   Procedure: ESOPHAGOGASTRODUODENOSCOPY (EGD) WITH PROPOFOL;  Surgeon: Midge Minium, MD;  Location: [facility];  Service: Endoscopy;  Laterality: N/A;  Diabetic - insulin pump and oral meds sleep apnea   POLYPECTOMY  10/10/2019   Procedure: POLYPECTOMY;  Surgeon: Midge Minium, MD;  Location: [facility];  Service: Endoscopy;;    Social History   Tobacco Use   Smoking status: Never    Smokeless tobacco: Never  Vaping Use   Vaping status: Never Used  Substance Use Topics   Alcohol use: Not Currently    Alcohol/week: 1.0 standard drink of alcohol    Types: 1 Standard drinks or equivalent per week   Drug use: Never     Medication list has been reviewed and updated.  Current Meds  Medication Sig   acetaminophen (TYLENOL) 500 MG tablet Take 1,000 mg by mouth 2 (two) times daily. As needed   amLODipine (NORVASC) 10 MG tablet Take 1 tablet by mouth once daily   Apoaequorin (PREVAGEN PO) Take by mouth.   Calcium Carbonate-Vit D-Min (CALCIUM 1200 PO) Take 2 each by mouth daily.   Cholecalciferol (VITAMIN D) 2000 UNITS CAPS Take 1 capsule (2,000 Units total) by mouth daily.   clotrimazole-betamethasone (LOTRISONE) cream Apply 1 Application topically daily. To feet and rash under breasts   Continuous Glucose Receiver (FREESTYLE LIBRE 3 READER) DEVI Use to monitor glucose levels   Continuous Glucose Sensor (FREESTYLE LIBRE 2 SENSOR) MISC Place 1 sensor on the skin every 14 days. Use to check glucose continuously   Continuous Glucose Sensor (FREESTYLE LIBRE 3 PLUS SENSOR) MISC 1 each by Does not apply route continuous. Change every 15 days.   Continuous Glucose Sensor (FREESTYLE LIBRE 3 SENSOR) MISC Place 1 sensor on the skin every 14 days. Use to check glucose continuously   gabapentin (NEURONTIN) 100 MG capsule Take 1-3 capsules (100-300 mg total) by mouth at bedtime. Follow written titration schedule. (Patient taking differently: Take 100-300 mg by mouth at bedtime. Patient is taking prn for hand pain/numbness)   GINKGO BILOBA COMPLEX PO Take 1 capsule by mouth daily.   Glucagon (GVOKE HYPOPEN 1-PACK) 1 MG/0.2ML SOAJ Inject 1 mg into the skin as needed (low blood sugar with impaired consciousness).   glucose blood (ONETOUCH VERIO) test strip USE 1 STRIP TO CHECK GLUCOSE 4 TIMES DAILY   insulin aspart (NOVOLOG) 100 UNIT/ML injection USE A MAX OF 90 UNITS DAILY VIA INSULIN PUMP    Insulin Disposable Pump (V-GO 20) 20 UNIT/24HR KIT 1 each by Other route as directed. 1  ONCE DAILY   Insulin Pen Needle (PEN NEEDLES) 32G X 5 MM MISC Please   losartan (COZAAR) 100 MG tablet Take 1 tablet by mouth once daily   meloxicam (MOBIC) 15 MG tablet Take 1 tablet by mouth once daily   NON FORMULARY CPAP @@ 9 cm H2O nightly   nystatin cream (MYCOSTATIN) Apply 1 Application topically 2 (two) times daily.   pravastatin (PRAVACHOL) 20 MG tablet Take 1 tablet by mouth once daily   triamcinolone cream (KENALOG) 0.5 % Apply 1 Application topically 3 (three) times daily. To rash on neck       02/19/2023    2:08 PM 01/02/2023    2:51 PM 11/29/2022    8:47 AM 11/01/2022   11:27 AM  GAD 7 : Generalized Anxiety Score  Nervous, Anxious, on Edge 0 0 1 0  Control/stop worrying 0 1 0 0  Worry  too much - different things 1 0 1 0  Trouble relaxing 0 0 0 0  Restless 0 0 0 0  Easily annoyed or irritable 0 0 0 0  Afraid - awful might happen 0 0 0 0  Total GAD 7 Score 1 1 2  0  Anxiety Difficulty Not difficult at all Not difficult at all Not difficult at all Not difficult at all       02/19/2023    2:08 PM 01/16/2023   11:39 AM 01/02/2023    2:51 PM  Depression screen PHQ 2/9  Decreased Interest 0 0 0  Down, Depressed, Hopeless 0 0 0  PHQ - 2 Score 0 0 0  Altered sleeping 0  0  Tired, decreased energy 1  1  Change in appetite 0  1  Feeling bad or failure about yourself  0  0  Trouble concentrating 1  1  Moving slowly or fidgety/restless 0  1  Suicidal thoughts 0  0  PHQ-9 Score 2  4  Difficult doing work/chores Not difficult at all  Not difficult at all    BP Readings from Last 3 Encounters:  03/13/23 128/62  02/22/23 (!) 167/84  02/19/23 128/74    Physical Exam Vitals and nursing note reviewed.  Constitutional:      General: She is not in acute distress.    Appearance: Normal appearance. She is well-developed.  HENT:     Head: Normocephalic and atraumatic.   Cardiovascular:     Rate and Rhythm: Normal rate and regular rhythm.     Pulses: Normal pulses.     Heart sounds: No murmur heard. Pulmonary:     Effort: Pulmonary effort is normal. No respiratory distress.     Breath sounds: No wheezing or rhonchi.  Musculoskeletal:     Cervical back: Normal range of motion.     Right lower leg: No edema.     Left lower leg: No edema.  Lymphadenopathy:     Cervical: No cervical adenopathy.  Skin:    General: Skin is warm and dry.     Findings: No rash.  Neurological:     Mental Status: She is alert and oriented to person, place, and time.  Psychiatric:        Mood and Affect: Mood normal.        Behavior: Behavior normal.     Wt Readings from Last 3 Encounters:  03/13/23 159 lb (72.1 kg)  02/22/23 158 lb (71.7 kg)  02/19/23 156 lb 3.2 oz (70.9 kg)    BP 128/62   Pulse 73   Ht 5\' 2"  (1.575 m)   Wt 159 lb (72.1 kg)   SpO2 98%   BMI 29.08 kg/m   Assessment and Plan:  Problem List Items Addressed This Visit       Unprioritized   Benign essential HTN (Chronic)    BP has been high at other offices and she continues to have an intermittent front headache.  It responds to tylenol or ASA. She became concerned that the HA may be from elevated BP so over the past few days has doubled her losartan and amlodipine. BP is normal today.  Headache improvement is questionable. Will double amlodipine for the next 2 weeks; continue losartan 100 mg once a day Return in 2 weeks for re-evaluation       Return in about 2 weeks (around 03/27/2023) for HTN.    Reubin Milan, MD [facility] [place]

## 2023-03-13 NOTE — Patient Instructions (Signed)
Continue Losartan 100 mg once a day only.  Take amlodipine 10 mg twice a day.  Check BP at home and record once a day some time after noon.  Return in 2 weeks with your cuff and BP readings to review.

## 2023-03-13 NOTE — Assessment & Plan Note (Signed)
BP has been high at other offices and she continues to have an intermittent front headache.  It responds to tylenol or ASA. She became concerned that the HA may be from elevated BP so over the past few days has doubled her losartan and amlodipine. BP is normal today.  Headache improvement is questionable. Will double amlodipine for the next 2 weeks; continue losartan 100 mg once a day Return in 2 weeks for re-evaluation

## 2023-03-22 ENCOUNTER — Ambulatory Visit: Payer: Self-pay | Admitting: *Deleted

## 2023-03-22 ENCOUNTER — Other Ambulatory Visit: Payer: Self-pay

## 2023-03-22 ENCOUNTER — Encounter: Payer: Self-pay | Admitting: Internal Medicine

## 2023-03-22 ENCOUNTER — Ambulatory Visit (INDEPENDENT_AMBULATORY_CARE_PROVIDER_SITE_OTHER): Payer: HMO | Admitting: Internal Medicine

## 2023-03-22 VITALS — BP 122/74 | HR 72 | Ht 62.0 in | Wt 159.0 lb

## 2023-03-22 DIAGNOSIS — R002 Palpitations: Secondary | ICD-10-CM | POA: Diagnosis not present

## 2023-03-22 DIAGNOSIS — Z23 Encounter for immunization: Secondary | ICD-10-CM | POA: Diagnosis not present

## 2023-03-22 DIAGNOSIS — I10 Essential (primary) hypertension: Secondary | ICD-10-CM

## 2023-03-22 MED ORDER — AMLODIPINE BESYLATE 10 MG PO TABS: 10.0000 mg | ORAL_TABLET | Freq: Two times a day (BID) | ORAL | 1 refills | Status: DC

## 2023-03-22 NOTE — Patient Instructions (Signed)
IMPRESSION:  *The predominant rhythm was Sinus. *The Maximum Heart Rate recorded was 120 bpm, 11/10 15:18:48, the Minimum Heart Rate recorded was 32 bpm, 11/11 02:18:35, and the Average Heart Rate was 68 bpm. *There were 6 VE beats with a burden of <1 %. *There were  78 SVE beats with a burden of <1 %. There was 1 occurrence of Supraventricular Tachycardia with the Fastest episode 118 bpm, 11/11 20:46:34, and the Longest episode 3 beats, 11/11 20:46:34. *There were 0 Patient Triggers.  I have reviewed the ECG  tracings and report, amended them as necessary and agree with the descriptions/interpretations enumerated on this summary page.

## 2023-03-22 NOTE — Telephone Encounter (Signed)
  Reason for Disposition  [1] Caller has URGENT medicine question about med that PCP or specialist prescribed AND [2] triager unable to answer question  Answer Assessment - Initial Assessment Questions 1. NAME of MEDICINE: "What medicine(s) are you calling about?"     Amlodipine 10 2. QUESTION: "What is your question?" (e.g., double dose of medicine, side effect)     Tina with [facility] in [place] [phone]. Needing clarification on dosing of Amlodipine.   Does she want it to be 10 mg twice a day?   It's usually once a day.    It's mentioned in Dr. Karn Cassis note that she is holding Losartan. 3. PRESCRIBER: "Who prescribed the medicine?" Reason: if prescribed by specialist, call should be referred to that group.     Dr. Judithann Graves 4. SYMPTOMS: "Do you have any symptoms?" If Yes, ask: "What symptoms are you having?"  "How bad are the symptoms (e.g., mild, moderate, severe)     N/A 5. PREGNANCY:  "Is there any chance that you are pregnant?" "When was your last menstrual period?"     N/A  Protocols used: Medication Question Call-A-AH

## 2023-03-22 NOTE — Assessment & Plan Note (Addendum)
BP well controlled on Amlodipine bid. Headache has resolved and she denies side effects. Will continue current therapy.  Hold Losartan for now.

## 2023-03-22 NOTE — Telephone Encounter (Signed)
Ok to fill for 2 times a day, refill sent in with note to pharmacy to fill the medication.  KP

## 2023-03-22 NOTE — Assessment & Plan Note (Signed)
Rare SVEs seen on Holter done for syncope evaluation. No malignant rhythms and no recurrent syncope

## 2023-03-22 NOTE — Progress Notes (Signed)
Date:  03/22/2023   Name:  Melinda Ford   DOB:  Aug 24, 1945   MRN:  [id number]   Chief Complaint: Hypertension  Hypertension This is a chronic problem. The problem has been waxing and waning since onset. The problem is controlled. Pertinent negatives include no chest pain, headaches or shortness of breath. Past treatments include calcium channel blockers and angiotensin blockers. The current treatment provides significant improvement.  Headache  This is a recurrent problem. The pain is located in the Occipital region. The pain does not radiate. The pain quality is similar to prior headaches. The quality of the pain is described as aching. The pain is mild. Pertinent negatives include no coughing, dizziness or fever. Her past medical history is significant for hypertension.  Syncope - she was seen at [facility] a month ago.  Had Holter monitor placed but she has not heard back. IMPRESSION:  *The predominant rhythm was Sinus. *The Maximum Heart Rate recorded was 120 bpm, 11/10 15:18:48, the Minimum Heart Rate recorded was 32 bpm, 11/11 02:18:35, and the Average Heart Rate was 68 bpm. *There were 6 VE beats with a burden of <1 %. *There were  78 SVE beats with a burden of <1 %. There was 1 occurrence of Supraventricular Tachycardia with the Fastest episode 118 bpm, 11/11 20:46:34, and the Longest episode 3 beats, 11/11 20:46:34. *There were 0 Patient Triggers.  I have reviewed the ECG  tracings and report, amended them as necessary and agree with the descriptions/interpretations enumerated on this summary page.   Review of Systems  Constitutional:  Negative for chills, fatigue and fever.  Respiratory:  Negative for cough, chest tightness and shortness of breath.   Cardiovascular:  Negative for chest pain and leg swelling.  Gastrointestinal:  Positive for constipation (managed with stool softener).  Neurological:  Negative for dizziness and headaches.  Psychiatric/Behavioral:  Negative for  dysphoric mood and sleep disturbance. The patient is not nervous/anxious.      Lab Results  Component Value Date   NA 140 11/02/2022   K 4.4 11/02/2022   CO2 28 11/02/2022   GLUCOSE 88 11/02/2022   BUN 21 11/02/2022   CREATININE 0.74 11/02/2022   CALCIUM 10.4 11/02/2022   EGFR 82 07/01/2021   GFRNONAA 98 09/10/2015   Lab Results  Component Value Date   CHOL 178 11/02/2022   HDL 70.70 11/02/2022   LDLCALC 96 11/02/2022   TRIG 56.0 11/02/2022   CHOLHDL 3 11/02/2022   Lab Results  Component Value Date   TSH 1.650 12/28/2020   Lab Results  Component Value Date   HGBA1C 7.4 (A) 02/13/2023   Lab Results  Component Value Date   WBC 5.2 07/01/2021   HGB 14.5 07/01/2021   HCT 44.4 07/01/2021   MCV 93 07/01/2021   PLT 284 07/01/2021   Lab Results  Component Value Date   ALT 35 11/02/2022   AST 30 11/02/2022   ALKPHOS 83 11/02/2022   BILITOT 0.6 11/02/2022   Lab Results  Component Value Date   VD25OH 34.0 12/28/2020     Patient Active Problem List   Diagnosis Date Noted   Palpitations 03/22/2023   Lumbar radiculopathy 02/22/2023   Trigger ring finger of right hand 07/23/2022   Cervical radicular pain (left C5,6) 03/17/2021   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/2021   Chronic pain syndrome 03/17/2021   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/2021   Cervical paraspinal muscle spasm 02/10/2021   Gastroesophageal reflux disease 12/28/2020   History  of colonic polyps    Polyp of sigmoid colon    Abdominal pain, epigastric    Patient noncompliant with statin medication 09/12/2019   Type II diabetes mellitus with complication (HCC) 06/04/2019   Hyperlipidemia associated with type 2 diabetes mellitus (HCC) 05/17/2018   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 02/22/2018   Chronic midline thoracic back pain 02/22/2018   Tinea versicolor 09/20/2016   OSA (obstructive sleep apnea) 08/23/2016   Allergic rhinitis 07/13/2016   Overweight  (BMI 25.0-29.9) 10/01/2015   Vitamin D deficiency 09/18/2014   Onychomycosis 09/16/2014   Degenerative disc disease, lumbar 09/16/2014   Benign essential HTN 10/10/2010   Chronic right-sided low back pain with right-sided sciatica 10/10/2010   Osteoporosis 10/10/2010    Allergies  Allergen Reactions   Ace Inhibitors Cough   Wheat Hives   Gramineae Pollens     Past Surgical History:  Procedure Laterality Date   ABDOMINAL HYSTERECTOMY     CATARACT EXTRACTION Right 2020   COLONOSCOPY WITH PROPOFOL N/A 10/10/2019   Procedure: COLONOSCOPY WITH PROPOFOL;  Surgeon: Midge Minium, MD;  Location: [facility];  Service: Endoscopy;  Laterality: N/A;   ESOPHAGOGASTRODUODENOSCOPY (EGD) WITH PROPOFOL N/A 10/10/2019   Procedure: ESOPHAGOGASTRODUODENOSCOPY (EGD) WITH PROPOFOL;  Surgeon: Midge Minium, MD;  Location: [facility];  Service: Endoscopy;  Laterality: N/A;  Diabetic - insulin pump and oral meds sleep apnea   POLYPECTOMY  10/10/2019   Procedure: POLYPECTOMY;  Surgeon: Midge Minium, MD;  Location: [facility];  Service: Endoscopy;;    Social History   Tobacco Use   Smoking status: Never   Smokeless tobacco: Never  Vaping Use   Vaping status: Never Used  Substance Use Topics   Alcohol use: Not Currently    Alcohol/week: 1.0 standard drink of alcohol    Types: 1 Standard drinks or equivalent per week   Drug use: Never     Medication list has been reviewed and updated.  Current Meds  Medication Sig   acetaminophen (TYLENOL) 500 MG tablet Take 1,000 mg by mouth 2 (two) times daily. As needed   Apoaequorin (PREVAGEN PO) Take by mouth.   Calcium Carbonate-Vit D-Min (CALCIUM 1200 PO) Take 2 each by mouth daily.   Cholecalciferol (VITAMIN D) 2000 UNITS CAPS Take 1 capsule (2,000 Units total) by mouth daily.   clotrimazole-betamethasone (LOTRISONE) cream Apply 1 Application topically daily. To feet and rash under breasts   Continuous Glucose Receiver (FREESTYLE  LIBRE 3 READER) DEVI Use to monitor glucose levels   Continuous Glucose Sensor (FREESTYLE LIBRE 2 SENSOR) MISC Place 1 sensor on the skin every 14 days. Use to check glucose continuously   Continuous Glucose Sensor (FREESTYLE LIBRE 3 PLUS SENSOR) MISC 1 each by Does not apply route continuous. Change every 15 days.   Continuous Glucose Sensor (FREESTYLE LIBRE 3 SENSOR) MISC Place 1 sensor on the skin every 14 days. Use to check glucose continuously   gabapentin (NEURONTIN) 100 MG capsule Take 1-3 capsules (100-300 mg total) by mouth at bedtime. Follow written titration schedule. (Patient taking differently: Take 100-300 mg by mouth at bedtime. Patient is taking prn for hand pain/numbness)   GINKGO BILOBA COMPLEX PO Take 1 capsule by mouth daily.   Glucagon (GVOKE HYPOPEN 1-PACK) 1 MG/0.2ML SOAJ Inject 1 mg into the skin as needed (low blood sugar with impaired consciousness).   glucose blood (ONETOUCH VERIO) test strip USE 1 STRIP TO CHECK GLUCOSE 4 TIMES DAILY   insulin aspart (NOVOLOG) 100 UNIT/ML injection  USE A MAX OF 90 UNITS DAILY VIA INSULIN PUMP   Insulin Disposable Pump (V-GO 20) 20 UNIT/24HR KIT 1 each by Other route as directed. 1  ONCE DAILY   Insulin Pen Needle (PEN NEEDLES) 32G X 5 MM MISC Please   losartan (COZAAR) 100 MG tablet Take 1 tablet by mouth once daily   meloxicam (MOBIC) 15 MG tablet Take 1 tablet by mouth once daily   NON FORMULARY CPAP @@ 9 cm H2O nightly   nystatin cream (MYCOSTATIN) Apply 1 Application topically 2 (two) times daily.   pravastatin (PRAVACHOL) 20 MG tablet Take 1 tablet by mouth once daily   triamcinolone cream (KENALOG) 0.5 % Apply 1 Application topically 3 (three) times daily. To rash on neck   [DISCONTINUED] amLODipine (NORVASC) 10 MG tablet Take 1 tablet by mouth once daily       03/22/2023    8:15 AM 02/19/2023    2:08 PM 01/02/2023    2:51 PM 11/29/2022    8:47 AM  GAD 7 : Generalized Anxiety Score  Nervous, Anxious, on Edge 0 0 0 1   Control/stop worrying 1 0 1 0  Worry too much - different things 1 1 0 1  Trouble relaxing 1 0 0 0  Restless 0 0 0 0  Easily annoyed or irritable 0 0 0 0  Afraid - awful might happen 0 0 0 0  Total GAD 7 Score 3 1 1 2   Anxiety Difficulty Not difficult at all Not difficult at all Not difficult at all Not difficult at all       03/22/2023    8:15 AM 02/19/2023    2:08 PM 01/16/2023   11:39 AM  Depression screen PHQ 2/9  Decreased Interest 0 0 0  Down, Depressed, Hopeless 0 0 0  PHQ - 2 Score 0 0 0  Altered sleeping 0 0   Tired, decreased energy 1 1   Change in appetite 1 0   Feeling bad or failure about yourself  1 0   Trouble concentrating 0 1   Moving slowly or fidgety/restless 0 0   Suicidal thoughts 0 0   PHQ-9 Score 3 2   Difficult doing work/chores Not difficult at all Not difficult at all     BP Readings from Last 3 Encounters:  03/22/23 122/74  03/13/23 128/62  02/22/23 (!) 167/84    Physical Exam Vitals and nursing note reviewed.  Constitutional:      General: She is not in acute distress.    Appearance: Normal appearance. She is well-developed.  HENT:     Head: Normocephalic and atraumatic.  Cardiovascular:     Rate and Rhythm: Normal rate and regular rhythm.  Pulmonary:     Effort: Pulmonary effort is normal. No respiratory distress.     Breath sounds: No wheezing or rhonchi.  Musculoskeletal:     Cervical back: Normal range of motion.     Right lower leg: No edema.     Left lower leg: No edema.  Lymphadenopathy:     Cervical: No cervical adenopathy.  Skin:    General: Skin is warm and dry.     Findings: No rash.  Neurological:     General: No focal deficit present.     Mental Status: She is alert and oriented to person, place, and time.  Psychiatric:        Mood and Affect: Mood normal.        Behavior: Behavior normal.  Wt Readings from Last 3 Encounters:  03/22/23 159 lb (72.1 kg)  03/13/23 159 lb (72.1 kg)  02/22/23 158 lb (71.7  kg)    BP 122/74   Pulse 72   Ht 5\' 2"  (1.575 m)   Wt 159 lb (72.1 kg)   SpO2 95%   BMI 29.08 kg/m   Assessment and Plan:  Problem List Items Addressed This Visit       Unprioritized   Benign essential HTN - Primary (Chronic)   BP well controlled on Amlodipine bid. Headache has resolved and she denies side effects. Will continue current therapy.  Hold Losartan for now.      Relevant Medications   amLODipine (NORVASC) 10 MG tablet   Palpitations   Rare SVEs seen on Holter done for syncope evaluation. No malignant rhythms and no recurrent syncope      Other Visit Diagnoses       Need for vaccination for pneumococcus       Relevant Orders   Pneumococcal conjugate vaccine 20-valent (Completed)       No follow-ups on file.    Reubin Milan, MD [facility] [place]

## 2023-03-22 NOTE — Telephone Encounter (Signed)
Please review.  KP

## 2023-03-22 NOTE — Telephone Encounter (Signed)
  Chief Complaint: Melinda Ford at [facility] pharmacy in [place] needing Amlodipine 10 mg dosing clarification..    Does she want it taken 10 mg twice a day?   It's usually once a day. Symptoms: N/A Frequency: N/A Pertinent Negatives: Patient denies N/A Disposition: [] ED /[] Urgent Care (no appt availability in office) / [] Appointment(In office/virtual)/ []  [place] [facility]/ [] Home Care/ [] Refused Recommended Disposition /[]  Mobile Bus/ [x]  Follow-up with PCP Additional Notes: Please notify [facility] in [place] [phone].   Melinda Ford is who called.

## 2023-04-02 ENCOUNTER — Telehealth: Payer: Self-pay | Admitting: Endocrinology

## 2023-04-02 NOTE — Telephone Encounter (Signed)
MEDICATION:  insulin lispro (HUMALOG) 100 UNIT/ML injection    PHARMACY:    [id number] - [address], [place] - [address] (Ph: [phone])    HAS THE PATIENT CONTACTED THEIR PHARMACY?  Yes  IS THIS A 90 DAY SUPPLY : Yes  IS PATIENT OUT OF MEDICATION: Yes  IF NOT; HOW MUCH IS LEFT:   LAST APPOINTMENT DATE: @11 /03/2023  NEXT APPOINTMENT DATE:@2 /02/2024  DO WE HAVE YOUR PERMISSION TO LEAVE A DETAILED MESSAGE?: Yes  OTHER COMMENTS:    **Let patient know to contact pharmacy at the end of the day to make sure medication is ready. **  ** Please notify patient to allow 48-72 hours to process**  **Encourage patient to contact the pharmacy for refills or they can request refills through [facility]**

## 2023-04-03 ENCOUNTER — Other Ambulatory Visit: Payer: Self-pay

## 2023-04-03 NOTE — Telephone Encounter (Signed)
 Patient is stating that she has been on the Insulin Lispro for a while and not the insulin Aspart. Is it okay to send the Humalog prescription instead of the Novolog. Patient is out of medication.

## 2023-04-05 MED ORDER — INSULIN LISPRO 100 UNIT/ML IJ SOLN: INTRAMUSCULAR | 2 refills | Status: DC

## 2023-04-05 NOTE — Telephone Encounter (Signed)
 Humalog has been sent to pharmacy

## 2023-04-05 NOTE — Addendum Note (Signed)
 Addended by: Lisabeth Pick on: 04/05/2023 08:14 AM   Modules accepted: Orders

## 2023-04-06 ENCOUNTER — Other Ambulatory Visit (HOSPITAL_COMMUNITY): Payer: Self-pay

## 2023-04-23 ENCOUNTER — Ambulatory Visit (INDEPENDENT_AMBULATORY_CARE_PROVIDER_SITE_OTHER): Payer: HMO | Admitting: Family Medicine

## 2023-04-23 ENCOUNTER — Other Ambulatory Visit (INDEPENDENT_AMBULATORY_CARE_PROVIDER_SITE_OTHER): Payer: HMO | Admitting: Radiology

## 2023-04-23 ENCOUNTER — Encounter: Payer: Self-pay | Admitting: Family Medicine

## 2023-04-23 VITALS — BP 130/74 | HR 73 | Ht 62.0 in | Wt 161.2 lb

## 2023-04-23 DIAGNOSIS — M65341 Trigger finger, right ring finger: Secondary | ICD-10-CM

## 2023-04-23 MED ORDER — TRIAMCINOLONE ACETONIDE 40 MG/ML IJ SUSP
40.0000 mg | Freq: Once | INTRAMUSCULAR | Status: AC
  Administered 2023-04-23: 40 mg via INTRAMUSCULAR

## 2023-04-23 NOTE — Patient Instructions (Addendum)
You have just been given a cortisone injection to reduce pain and inflammation. After the injection you may notice immediate relief of pain as a result of the Lidocaine. It is important to rest the area of the injection for 24 to 48 hours after the injection. There is a possibility of some temporary increased discomfort and swelling for up to 72 hours until the cortisone begins to work. If you do have pain, simply rest the joint and use ice. If you can tolerate over the counter medications, you can try Tylenol, Aleve, or Advil for added relief per package instructions. Plan for Trigger Finger (Ring Finger):  - Treatment Administered: Please follow the postinjection care instructions above. - Home Exercises: Begin exercises to improve tendon flexibility once the injection takes effect. - Follow-up: If symptoms persist or recur, contact us.  Otherwise follow-up as needed.

## 2023-04-23 NOTE — Assessment & Plan Note (Signed)
History of Present Illness Miss Posa, a right-handed patient, presents with a recurring issue in her right ring finger. She reports that the finger sometimes locks up and she has difficulty holding things, which is interfering with her daily activities, including driving and using a computer. The problem started to reoccur about a month ago and has been progressively getting worse. She had a similar issue in the past which resolved after a cortisone shot from 08/14/2022, but she was unsure if it would clear up this time. She has been managing the locking episodes by working with her finger to unlock it.  Assessment and Plan Trigger Finger (right Ring Finger) Recurrent locking and decreased grip strength. Discussed the pathophysiology of trigger finger and the role of cortisone injections in reducing inflammation and swelling of the tendon. Also discussed the potential need for surgery if symptoms persist despite conservative management. -Administer cortisone injection today under ultrasound guidance. -Start home exercises after the cortisone injection takes effect to improve tendon flexibility. -Plan for follow-up if symptoms persist or recur, can repeat corticosteroid, did discuss surgical management options with patient.

## 2023-04-23 NOTE — Progress Notes (Signed)
     Primary Care / Sports Medicine Office Visit  Patient Information:  Patient ID: Melinda Ford, female DOB: Oct 24, 1945 Age: 78 y.o. MRN: [id number]   [name] is a pleasant 78 y.o. female presenting with the following:  Chief Complaint  Patient presents with   Hand Pain    Trigger ring finger of right hand    Vitals:   04/23/23 1112 04/23/23 1120  BP: (!) 124/90 130/74  Pulse: 73   SpO2: 96%    Vitals:   04/23/23 1112  Weight: 161 lb 3.2 oz (73.1 kg)  Height: 5\' 2"  (1.575 m)   Body mass index is 29.48 kg/m.  No results found.   Independent interpretation of notes and tests performed by another provider:   None  Procedures performed:   Procedure:  Injection of right fourth digit trigger finger under ultrasound guidance. Ultrasound guidance utilized for in-plane approach to right fourth digit flexor tendon at the distal palmar crease, tendon thickening noted at the area of pain Samsung HS60 device utilized with permanent recording / reporting. Verbal informed consent obtained and verified. Skin prepped in a sterile fashion. Ethyl chloride for topical local analgesia.  Completed without difficulty and tolerated well. Medication: triamcinolone acetonide 40 mg/mL suspension for injection 0.5 mL total and 0.5 mL lidocaine 1% without epinephrine utilized for needle placement anesthetic Advised to contact for fevers/chills, erythema, induration, drainage, or persistent bleeding.   Pertinent History, Exam, Impression, and Recommendations:   Problem List Items Addressed This Visit     Trigger ring finger of right hand - Primary   History of Present Illness [name], a right-handed patient, presents with a recurring issue in her right ring finger. She reports that the finger sometimes locks up and she has difficulty holding things, which is interfering with her daily activities, including driving and using a computer. The problem started to reoccur about a month  ago and has been progressively getting worse. She had a similar issue in the past which resolved after a cortisone shot from 08/14/2022, but she was unsure if it would clear up this time. She has been managing the locking episodes by working with her finger to unlock it.  Assessment and Plan Trigger Finger (right Ring Finger) Recurrent locking and decreased grip strength. Discussed the pathophysiology of trigger finger and the role of cortisone injections in reducing inflammation and swelling of the tendon. Also discussed the potential need for surgery if symptoms persist despite conservative management. -Administer cortisone injection today under ultrasound guidance. -Start home exercises after the cortisone injection takes effect to improve tendon flexibility. -Plan for follow-up if symptoms persist or recur, can repeat corticosteroid, did discuss surgical management options with patient.      Relevant Orders   [place] LIMITED JOINT SPACE STRUCTURES UP RIGHT     Orders & Medications Medications:  Meds ordered this encounter  Medications   triamcinolone acetonide (KENALOG-40) injection 40 mg   Orders Placed This Encounter  Procedures   [place] LIMITED JOINT SPACE STRUCTURES UP RIGHT     No follow-ups on file.     Jerrol Banana, MD, [facility]   Primary Care Sports Medicine Primary Care and Sports Medicine at [facility]

## 2023-05-04 DIAGNOSIS — E785 Hyperlipidemia, unspecified: Secondary | ICD-10-CM | POA: Diagnosis not present

## 2023-05-04 DIAGNOSIS — I152 Hypertension secondary to endocrine disorders: Secondary | ICD-10-CM | POA: Diagnosis not present

## 2023-05-04 DIAGNOSIS — E11649 Type 2 diabetes mellitus with hypoglycemia without coma: Secondary | ICD-10-CM | POA: Diagnosis not present

## 2023-05-04 DIAGNOSIS — E1159 Type 2 diabetes mellitus with other circulatory complications: Secondary | ICD-10-CM | POA: Diagnosis not present

## 2023-05-04 DIAGNOSIS — Z794 Long term (current) use of insulin: Secondary | ICD-10-CM | POA: Diagnosis not present

## 2023-05-04 DIAGNOSIS — E1169 Type 2 diabetes mellitus with other specified complication: Secondary | ICD-10-CM | POA: Diagnosis not present

## 2023-05-07 ENCOUNTER — Ambulatory Visit: Payer: PPO | Admitting: Physician Assistant

## 2023-05-07 DIAGNOSIS — H6123 Impacted cerumen, bilateral: Secondary | ICD-10-CM | POA: Diagnosis not present

## 2023-05-07 DIAGNOSIS — H900 Conductive hearing loss, bilateral: Secondary | ICD-10-CM | POA: Diagnosis not present

## 2023-05-15 ENCOUNTER — Ambulatory Visit: Payer: PPO | Admitting: Endocrinology

## 2023-06-04 DIAGNOSIS — M79674 Pain in right toe(s): Secondary | ICD-10-CM | POA: Diagnosis not present

## 2023-06-04 DIAGNOSIS — M79675 Pain in left toe(s): Secondary | ICD-10-CM | POA: Diagnosis not present

## 2023-06-04 DIAGNOSIS — B351 Tinea unguium: Secondary | ICD-10-CM | POA: Diagnosis not present

## 2023-06-04 DIAGNOSIS — B353 Tinea pedis: Secondary | ICD-10-CM | POA: Diagnosis not present

## 2023-06-04 DIAGNOSIS — E1142 Type 2 diabetes mellitus with diabetic polyneuropathy: Secondary | ICD-10-CM | POA: Diagnosis not present

## 2023-06-21 ENCOUNTER — Ambulatory Visit (INDEPENDENT_AMBULATORY_CARE_PROVIDER_SITE_OTHER): Payer: PPO | Admitting: Endocrinology

## 2023-06-21 ENCOUNTER — Encounter: Payer: Self-pay | Admitting: Endocrinology

## 2023-06-21 VITALS — BP 128/64 | HR 68 | Resp 16 | Ht 62.0 in | Wt 156.6 lb

## 2023-06-21 DIAGNOSIS — Z7985 Long-term (current) use of injectable non-insulin antidiabetic drugs: Secondary | ICD-10-CM

## 2023-06-21 DIAGNOSIS — E1165 Type 2 diabetes mellitus with hyperglycemia: Secondary | ICD-10-CM | POA: Diagnosis not present

## 2023-06-21 DIAGNOSIS — Z7984 Long term (current) use of oral hypoglycemic drugs: Secondary | ICD-10-CM | POA: Diagnosis not present

## 2023-06-21 NOTE — Progress Notes (Signed)
 Outpatient Endocrinology Note [place] Rory Xiang, MD  06/21/23  Patient's Name: Melinda Ford    DOB: 1945/04/28    MRN: [id number]                                                    REASON OF VISIT: Follow up of type 2 diabetes mellitus  PCP: Reubin Milan, MD  HISTORY OF PRESENT ILLNESS:   Melinda Ford is a 78 y.o. old female with past medical history listed below, is here for follow up for type 2 diabetes mellitus.   Pertinent Diabetes History: Patient was diagnosed with type 2 diabetes mellitus around 2004.  Insulin regimen was started in 2016.  Chronic Diabetes Complications : Retinopathy: no. Last ophthalmology exam was done on annually, reportedly, following with ophthalmology regularly.  Nephropathy: no, on ACE/ARB / losartan Peripheral neuropathy: yes, on gabapentin Coronary artery disease: no Stroke: no  Relevant comorbidities and cardiovascular risk factors: Obesity: no Body mass index is 28.64 kg/m.  Hypertension: Yes  Hyperlipidemia : Yes, on statin   Current / Home Diabetic regimen includes:  Ozempic ? 0.5 mg weekly. Glipizide extended release 5 mg daily. Metformin 500 mg 2 times a day.  Prior diabetic medications: Januvia, metformin and Avandia in the past.  Lantus in the past.  Nausea and diarrhea with metformin 1000 mg. No longer on V-Go pump.  Glycemic data:   Melinda Ford has freestyle libre CGM, not downloaded in the clinic today.  Reviewed from her reader, Melinda Ford is mostly having hyperglycemia with blood sugar 1 82-50 range and sometime up to 300, variable blood sugar in the morning mostly in the low 100 range.  No noted hypoglycemia.  Hypoglycemia: Patient has no hypoglycemic episodes. Patient has hypoglycemia awareness.  Factors modifying glucose control: 1.  Diabetic diet assessment: 3 meals a day.  2.  Staying active or exercising: No formal exercise.  3.  Medication compliance: compliant most of the time.  # OSTEOPOROSIS: Followed by PCP, lowest T  score was- 2.6 at the spine compared to -3.0 before. Melinda Ford was previously taking alendronate. Also has had longstanding vitamin D deficiency. Has been taking 4000 units of vitamin D3   Interval history  Patient had seen endocrinologist at [facility] on May 04, 2023 Dr. Gershon Crane for diabetes care, for her convenience and closer to her home.  Melinda Ford had changes in on her diabetes regimen.  Melinda Ford presented today in this clinic for the second opinion regarding her recent diabetic regimen change.  Due to overnight hypoglycemia or V-Go pump was stopped prior to that Melinda Ford was on V-Go pump 20 units delivery device, restarted Ozempic, metformin and added glipizide.  Ozempic was stopped in November 2024 due to patient concern and choice regarding unwanted weight loss and muscle mass loss and muscle weakness.  Patient has no other complaints today.  Patient reports he has follow-up with endocrinologist at [facility] tomorrow.  Melinda Ford declined to do hemoglobin A1c today in the clinic.  REVIEW OF SYSTEMS As per history of present illness.   PAST MEDICAL HISTORY: Past Medical History:  Diagnosis Date   Allergy    Arthritis    Cataract    Diabetes mellitus without complication (HCC)    Hyperlipidemia    Hypertension    Motion sickness    planes   OSA (  obstructive sleep apnea) 08/23/2016   CPAP   Osteoporosis    Presence of dental prosthetic device    implants   Radiculopathy of lumbosacral region 06/22/2016   Right lateral epicondylitis 06/04/2017   Right sided sciatica 03/09/2016   Sleep apnea     PAST SURGICAL HISTORY: Past Surgical History:  Procedure Laterality Date   ABDOMINAL HYSTERECTOMY     CATARACT EXTRACTION Right 2020   COLONOSCOPY WITH PROPOFOL N/A 10/10/2019   Procedure: COLONOSCOPY WITH PROPOFOL;  Surgeon: Midge Minium, MD;  Location: [facility];  Service: Endoscopy;  Laterality: N/A;   ESOPHAGOGASTRODUODENOSCOPY (EGD) WITH PROPOFOL N/A 10/10/2019    Procedure: ESOPHAGOGASTRODUODENOSCOPY (EGD) WITH PROPOFOL;  Surgeon: Midge Minium, MD;  Location: [facility];  Service: Endoscopy;  Laterality: N/A;  Diabetic - insulin pump and oral meds sleep apnea   POLYPECTOMY  10/10/2019   Procedure: POLYPECTOMY;  Surgeon: Midge Minium, MD;  Location: [facility];  Service: Endoscopy;;    ALLERGIES: Allergies  Allergen Reactions   Ace Inhibitors Cough   Wheat Hives   Gramineae Pollens     FAMILY HISTORY:  Family History  Problem Relation Age of Onset   Breast cancer Other 40   Alcohol abuse Father    Mental illness Father    Stroke Father    Diabetes Sister    Asthma Brother    Diabetes Brother    Diabetes Maternal Aunt    Heart disease Maternal Aunt    Diabetes Maternal Uncle    Early death Maternal Uncle    Breast cancer Cousin 54       mat cousin    SOCIAL HISTORY: Social History   Socioeconomic History   Marital status: Married    Spouse name: Aquinnah Devin   Number of children: 1   Years of education: 20   Highest education level: Doctorate  Occupational History   Not on file  Tobacco Use   Smoking status: Never   Smokeless tobacco: Never  Vaping Use   Vaping status: Never Used  Substance and Sexual Activity   Alcohol use: Not Currently    Alcohol/week: 1.0 standard drink of alcohol    Types: 1 Standard drinks or equivalent per week   Drug use: Never   Sexual activity: Not Currently    Partners: Male  Other Topics Concern   Not on file  Social History Narrative   Not on file   Social Drivers of Health   Financial Resource Strain: Low Risk  (06/04/2023)   Received from [facility]   Overall Financial Resource Strain (CARDIA)    Difficulty of Paying Living Expenses: Not hard at all  Food Insecurity: No Food Insecurity (06/04/2023)   [facility]   Hunger Vital Sign    Worried About Running Out of Food in the Last Year: Never true    Ran Out  of Food in the Last Year: Never true  Transportation Needs: No Transportation Needs (06/04/2023)   Received from [facility] - Transportation    In the past 12 months, has lack of transportation kept you from medical appointments or from getting medications?: No    Lack of Transportation (Non-Medical): No  Physical Activity: Insufficiently Active (08/17/2022)   Exercise Vital Sign    Days of Exercise per Week: 2 days    Minutes of Exercise per Session: 30 min  Stress: No Stress Concern Present (08/17/2022)   [employer] of Occupational Health -  Occupational Stress Questionnaire    Feeling of Stress : Only a little  Social Connections: Socially Integrated (08/17/2022)   Social Connection and Isolation Panel [NHANES]    Frequency of Communication with Friends and Family: More than three times a week    Frequency of Social Gatherings with Friends and Family: Never    Attends Religious Services: More than 4 times per year    Active Member of Clubs or Organizations: Yes    Attends Engineer, structural: More than 4 times per year    Marital Status: Married    MEDICATIONS:  Current Outpatient Medications  Medication Sig Dispense Refill   acetaminophen (TYLENOL) 500 MG tablet Take 1,000 mg by mouth 2 (two) times daily. As needed     amLODipine (NORVASC) 10 MG tablet Take 1 tablet (10 mg total) by mouth 2 (two) times daily. 180 tablet 1   Apoaequorin (PREVAGEN PO) Take by mouth.     Calcium Carbonate-Vit D-Min (CALCIUM 1200 PO) Take 2 each by mouth daily.     Cholecalciferol (VITAMIN D) 2000 UNITS CAPS Take 1 capsule (2,000 Units total) by mouth daily. 30 capsule    clotrimazole-betamethasone (LOTRISONE) cream Apply 1 Application topically daily. To feet and rash under breasts 45 g 3   Continuous Glucose Receiver (FREESTYLE LIBRE 3 READER) DEVI Use to monitor glucose levels 1 each 0   Continuous Glucose Sensor (FREESTYLE LIBRE 2 SENSOR) MISC Place 1  sensor on the skin every 14 days. Use to check glucose continuously 6 each 2   Continuous Glucose Sensor (FREESTYLE LIBRE 3 PLUS SENSOR) MISC 1 each by Does not apply route continuous. Change every 15 days. 6 each 3   Continuous Glucose Sensor (FREESTYLE LIBRE 3 SENSOR) MISC Place 1 sensor on the skin every 14 days. Use to check glucose continuously 6 each 3   GINKGO BILOBA COMPLEX PO Take 1 capsule by mouth daily.     glipiZIDE (GLUCOTROL XL) 5 MG 24 hr tablet Take 5 mg by mouth daily.     Glucagon (GVOKE HYPOPEN 1-PACK) 1 MG/0.2ML SOAJ Inject 1 mg into the skin as needed (low blood sugar with impaired consciousness). 0.4 mL 2   glucose blood (ONETOUCH VERIO) test strip USE 1 STRIP TO CHECK GLUCOSE 4 TIMES DAILY 300 each 0   Insulin Pen Needle (PEN NEEDLES) 32G X 5 MM MISC Please 50 each 0   meloxicam (MOBIC) 15 MG tablet Take 1 tablet by mouth once daily 30 tablet 0   metFORMIN (GLUCOPHAGE-XR) 500 MG 24 hr tablet Take 1 tablet by mouth 2 (two) times daily.     NON FORMULARY CPAP @@ 9 cm H2O nightly     nystatin cream (MYCOSTATIN) Apply 1 Application topically 2 (two) times daily.     pravastatin (PRAVACHOL) 20 MG tablet Take 1 tablet by mouth once daily 90 tablet 2   Semaglutide (OZEMPIC, 1 MG/DOSE, [place]) Inject into the skin.     triamcinolone cream (KENALOG) 0.5 % Apply 1 Application topically 3 (three) times daily. To rash on neck 30 g 0   gabapentin (NEURONTIN) 100 MG capsule Take 1-3 capsules (100-300 mg total) by mouth at bedtime. Follow written titration schedule. (Patient taking differently: Take 100-300 mg by mouth at bedtime. Patient is taking prn for hand pain/numbness) 90 capsule 0   No current facility-administered medications for this visit.    PHYSICAL EXAM: Vitals:   06/21/23 1615  BP: 128/64  Pulse: 68  Resp: 16  SpO2: 97%  Weight:  156 lb 9.6 oz (71 kg)  Height: 5\' 2"  (1.575 m)    Body mass index is 28.64 kg/m.  Wt Readings from Last 3 Encounters:  06/21/23 156 lb  9.6 oz (71 kg)  04/23/23 161 lb 3.2 oz (73.1 kg)  03/22/23 159 lb (72.1 kg)    General: Well developed, well nourished female in no apparent distress.  HEENT: AT/[place], no external lesions.  Eyes: Conjunctiva clear and no icterus. Neck: Neck supple  Lungs: Respirations not labored Neurologic: Alert, oriented, normal speech Extremities / Skin: Dry. No sores or rashes noted.  Psychiatric: Does not appear depressed or anxious  Diabetic Foot Exam - Simple   No data filed    LABS Reviewed Lab Results  Component Value Date   HGBA1C 7.4 (A) 02/13/2023   HGBA1C 7.6 11/02/2022   HGBA1C 7.6 (A) 08/29/2022   Lab Results  Component Value Date   FRUCTOSAMINE 281 11/02/2017   Lab Results  Component Value Date   CHOL 178 11/02/2022   HDL 70.70 11/02/2022   LDLCALC 96 11/02/2022   TRIG 56.0 11/02/2022   CHOLHDL 3 11/02/2022   Lab Results  Component Value Date   MICRALBCREAT 0.7 11/02/2022   MICRALBCREAT 1.4 11/22/2021   Lab Results  Component Value Date   CREATININE 0.74 11/02/2022   Lab Results  Component Value Date   GFR 77.65 11/02/2022    ASSESSMENT / PLAN  1. Uncontrolled type 2 diabetes mellitus with hyperglycemia (HCC)    Diabetes Mellitus type 2, complicated by diabetic neuropathy. - Diabetic status / severity: Uncontrolled/fair control.  Lab Results  Component Value Date   HGBA1C 7.4 (A) 02/13/2023    - Hemoglobin A1c goal : <7.0%  Patient has established diabetes care at [facility] , Melinda Ford had initial visit in May 04, 2023.  Patient presented today for the second opinion regarding her diabetes regimen since on that visit.  I discussed with the patient that I do agree with taking off V-Go pump 20, due to overnight hypoglycemia.  CGM data is not downloaded and reviewed however reviewing through the reader Melinda Ford still has hyperglycemia with blood sugar in the range of 180-250 and sometime up to 300 however fasting blood sugar seems to be  acceptable in low 100 range.  Melinda Ford is currently on Ozempic ?  0.5 mg weekly, patient not sure about the dose.  Melinda Ford is also on glipizide XR  5 mg extended release daily and metformin 500 mg 2 times a day.  Discussed that her current medications doses can be adjusted based on her blood sugar and diabetes control, if needed basal insulin injectable can be added.  Patient will continue to follow-up with her endocrinologist at [facility]  Melinda Ford said Melinda Ford has appointment tomorrow.  No diabetes regimen changes made today.  - Home glucose testing: Continue CGM Freestyle libre 2+.  And check as needed.  - Discussed/ Gave Hypoglycemia treatment plan.  Glucagon Emergency Kit prescribed.  Advised to use glucose tablet to correct hypoglycemia.  # Consult : not required at this time.   # Annual urine for microalbuminuria/ creatinine ratio, no microalbuminuria currently, continue ACE/ARB /losartan. Last  Lab Results  Component Value Date   MICRALBCREAT 0.7 11/02/2022    # Foot check nightly / neuropathy, continue gabapentin.  # Annual dilated diabetic eye exams.   - Diet: Make healthy diabetic food choices - Life style / activity / exercise: Discussed.  2. Blood pressure  -  BP Readings from Last 1  Encounters:  06/21/23 128/64    - Control is in target.  - No change in current plans.  3. Lipid status / Hyperlipidemia - Last  Lab Results  Component Value Date   LDLCALC 96 11/02/2022   - Continue pravastatin.  Rosary was seen today for medical management of chronic issues.  Diagnoses and all orders for this visit:  Uncontrolled type 2 diabetes mellitus with hyperglycemia (HCC)    DISPOSITION Patient will follow up with outside endocrinologist at [facility].  Encouraged to call our clinic with any questions.   All questions answered and patient verbalized understanding of the plan.  [place] Mikko Lewellen, MD [facility] Endocrinology [facility] [address] [place], [address] [place],  [place] [address] Phone # [phone]  At least part of this note was generated using voice recognition software. Inadvertent word errors may have occurred, which were not recognized during the proofreading process.

## 2023-06-22 DIAGNOSIS — E11649 Type 2 diabetes mellitus with hypoglycemia without coma: Secondary | ICD-10-CM | POA: Diagnosis not present

## 2023-06-22 DIAGNOSIS — E1169 Type 2 diabetes mellitus with other specified complication: Secondary | ICD-10-CM | POA: Diagnosis not present

## 2023-06-22 DIAGNOSIS — I152 Hypertension secondary to endocrine disorders: Secondary | ICD-10-CM | POA: Diagnosis not present

## 2023-06-22 DIAGNOSIS — Z794 Long term (current) use of insulin: Secondary | ICD-10-CM | POA: Diagnosis not present

## 2023-06-22 DIAGNOSIS — E785 Hyperlipidemia, unspecified: Secondary | ICD-10-CM | POA: Diagnosis not present

## 2023-06-22 DIAGNOSIS — E1159 Type 2 diabetes mellitus with other circulatory complications: Secondary | ICD-10-CM | POA: Diagnosis not present

## 2023-07-03 ENCOUNTER — Encounter: Payer: Self-pay | Admitting: Internal Medicine

## 2023-07-12 ENCOUNTER — Ambulatory Visit: Payer: Self-pay

## 2023-07-12 ENCOUNTER — Telehealth: Payer: Self-pay | Admitting: Internal Medicine

## 2023-07-12 NOTE — Telephone Encounter (Signed)
 Patient called in requesting Diflucan called into pharmacy - [facility] on [address]. Patient was unable to talk and do full assessment but states she is just having vaginal itching for two weeks. Patient has not been on abx recently but has changed off of metformin. Patient states if it is required for her to come into office to please contact her directly.    Reason for Disposition  All other urine symptoms  Answer Assessment - Initial Assessment Questions 1. SYMPTOM: "What's the main symptom you're concerned about?" (e.g., frequency, incontinence)     Vaginal itching 2. ONSET: "When did the  symptoms  start?"     Two weeks 3. PAIN: "Is there any pain?" If Yes, ask: "How bad is it?" (Scale: 1-10; mild, moderate, severe)     No 4. CAUSE: "What do you think is causing the symptoms?"     Yeast Infection 5. OTHER SYMPTOMS: "Do you have any other symptoms?" (e.g., blood in urine, fever, flank pain, pain with urination)     Itching  Protocols used: Urinary Symptoms-A-AH

## 2023-07-12 NOTE — Telephone Encounter (Signed)
 Copied from CRM [id number]. Topic: Clinical - Medication Refill >> Jul 12, 2023  5:00 PM Priscille Loveless wrote: Most Recent Primary Care Visit:  Provider: Jerrol Banana  Department: [facility]  Visit Type: OFFICE VISIT  Date: 04/23/2023  Medication: fluconazole (DIFLUCAN) 100 MG tablet  Has the patient contacted their pharmacy? No   Is this the correct pharmacy for this prescription? Yes If no, delete pharmacy and type the correct one.  This is the patient's preferred pharmacy:  [facility] [address] [place] - [address] [place] [address] Phone: [phone] Fax: [phone]     Has the prescription been filled recently? No  Is the patient out of the medication? Yes  Has the patient been seen for an appointment in the last year OR does the patient have an upcoming appointment? Yes  Can we respond through MyChart? Yes  Agent: Please be advised that Rx refills may take up to 3 business days. We ask that you follow-up with your pharmacy.

## 2023-07-12 NOTE — Telephone Encounter (Signed)
 Patient is being triaged for symptoms, see nurse triage encounter.

## 2023-07-13 NOTE — Telephone Encounter (Signed)
Please call pt to schedule an appointment.  KP

## 2023-07-13 NOTE — Telephone Encounter (Signed)
 Pt PCP is Dr. Judithann Graves. Front desk will call pt to schedule a appointment.  KP

## 2023-07-16 ENCOUNTER — Ambulatory Visit: Admitting: Internal Medicine

## 2023-07-18 ENCOUNTER — Other Ambulatory Visit: Payer: Self-pay | Admitting: Internal Medicine

## 2023-07-18 ENCOUNTER — Other Ambulatory Visit (HOSPITAL_COMMUNITY)
Admission: RE | Admit: 2023-07-18 | Discharge: 2023-07-18 | Disposition: A | Discharge status: Home / Self Care | Source: Ambulatory Visit | Attending: Internal Medicine | Admitting: Internal Medicine

## 2023-07-18 ENCOUNTER — Ambulatory Visit (INDEPENDENT_AMBULATORY_CARE_PROVIDER_SITE_OTHER): Admitting: Internal Medicine

## 2023-07-18 ENCOUNTER — Encounter: Payer: Self-pay | Admitting: Internal Medicine

## 2023-07-18 VITALS — BP 114/70 | HR 66 | Ht 62.0 in | Wt 153.2 lb

## 2023-07-18 DIAGNOSIS — B351 Tinea unguium: Secondary | ICD-10-CM

## 2023-07-18 DIAGNOSIS — E118 Type 2 diabetes mellitus with unspecified complications: Secondary | ICD-10-CM | POA: Diagnosis not present

## 2023-07-18 DIAGNOSIS — Z7984 Long term (current) use of oral hypoglycemic drugs: Secondary | ICD-10-CM | POA: Diagnosis not present

## 2023-07-18 DIAGNOSIS — Z0000 Encounter for general adult medical examination without abnormal findings: Secondary | ICD-10-CM | POA: Diagnosis not present

## 2023-07-18 DIAGNOSIS — Z1231 Encounter for screening mammogram for malignant neoplasm of breast: Secondary | ICD-10-CM | POA: Diagnosis not present

## 2023-07-18 DIAGNOSIS — N761 Subacute and chronic vaginitis: Secondary | ICD-10-CM | POA: Insufficient documentation

## 2023-07-18 DIAGNOSIS — K219 Gastro-esophageal reflux disease without esophagitis: Secondary | ICD-10-CM | POA: Diagnosis not present

## 2023-07-18 DIAGNOSIS — E785 Hyperlipidemia, unspecified: Secondary | ICD-10-CM | POA: Diagnosis not present

## 2023-07-18 DIAGNOSIS — I10 Essential (primary) hypertension: Secondary | ICD-10-CM | POA: Diagnosis not present

## 2023-07-18 DIAGNOSIS — G8929 Other chronic pain: Secondary | ICD-10-CM | POA: Diagnosis not present

## 2023-07-18 DIAGNOSIS — M5441 Lumbago with sciatica, right side: Secondary | ICD-10-CM | POA: Diagnosis not present

## 2023-07-18 DIAGNOSIS — E1169 Type 2 diabetes mellitus with other specified complication: Secondary | ICD-10-CM

## 2023-07-18 DIAGNOSIS — G4733 Obstructive sleep apnea (adult) (pediatric): Secondary | ICD-10-CM

## 2023-07-18 NOTE — Assessment & Plan Note (Signed)
 LDL is  Lab Results  Component Value Date   LDLCALC 96 11/02/2022   Current regimen is pravastatin.  No medication side effects noted. Goal LDL is <70.

## 2023-07-18 NOTE — Assessment & Plan Note (Signed)
 Reflux symptoms are controlled with diet changes.  Not taking omeprazole prescribed. Patient denies red flag symptoms - no melena, weight loss, dysphagia.

## 2023-07-18 NOTE — Assessment & Plan Note (Addendum)
 Blood pressure is well controlled.  Current medications are amlodipine 10 mg daily. Will continue same regimen along with efforts to limit dietary sodium.

## 2023-07-18 NOTE — Assessment & Plan Note (Addendum)
 Seen by Dr. Rhesa Celeste in Nov 2024 - MRI done but she never had follow up. Recommend follow up to discuss injections.

## 2023-07-18 NOTE — Assessment & Plan Note (Signed)
 She is very bothered by her dystrophic nails and peeling skin on both feet. Podiatry is willing to treat her with oral antifungals but wants her off of statins for 30 days prior -  She can stop pravachol three times per week and the begin medications at next Podiatry visit in one month

## 2023-07-18 NOTE — Assessment & Plan Note (Signed)
 On CPAP nightly with good compliance and response to treatment.

## 2023-07-18 NOTE — Assessment & Plan Note (Addendum)
 Blood sugars have been increasing.  No recent hypoglycemic events requiring assistance. Currently medications are glimepiride and Ozempic is being titrated up by Endo. Lab Results  Component Value Date   HGBA1C 7.4 (A) 02/13/2023   Last visit metformin was stopped due to GI SE and glucoses have risen.  Recommend Melinda Ford consult Endo for recommendations.

## 2023-07-18 NOTE — Patient Instructions (Signed)
 Team Member Role and Visual merchandiser Info Address Start End Comments  Cephus Collin, MD Consulting Physician (Pain Medicine) Phone: [phone] Fax: [phone] [address] [place] [address] 07/18/2023 - -    Call [facility] to schedule your mammogram at [phone].

## 2023-07-18 NOTE — Progress Notes (Signed)
 Date:  07/18/2023   Name:  Melinda Ford   DOB:  [birth year]   MRN:  [id number]   Chief Complaint: Annual Exam and Vaginal Itching Melinda Ford is a 78 y.o. female who presents today for her Complete Annual Exam. She feels well. She reports exercising goes to the gym twice a week. She reports she is sleeping fairly well. Breast complaints none.  Health Maintenance  Topic Date Due   Medicare Annual Wellness Visit  08/17/2023   COVID-19 Vaccine (5 - 2024-25 season) 08/03/2023*   Hemoglobin A1C  08/13/2023   Eye exam for diabetics  09/05/2023   Mammogram  10/16/2023   Yearly kidney function blood test for diabetes  11/02/2023   Yearly kidney health urinalysis for diabetes  11/02/2023   Flu Shot  11/02/2023   Complete foot exam   07/17/2024   DTaP/Tdap/Td vaccine (2 - Td or Tdap) 01/23/2027   Pneumonia Vaccine  Completed   DEXA scan (bone density measurement)  Completed   Hepatitis C Screening  Completed   Zoster (Shingles) Vaccine  Completed   HPV Vaccine  Aged Out   Meningitis B Vaccine  Aged Out   Colon Cancer Screening  Discontinued  *Topic was postponed. The date shown is not the original due date.    Diabetes Pertinent negatives for hypoglycemia include no dizziness, headaches or nervousness/anxiousness. Pertinent negatives for diabetes include no chest pain, no fatigue and no weakness.  Hyperlipidemia Pertinent negatives include no chest pain, myalgias or shortness of breath.  Hypertension Pertinent negatives include no chest pain, headaches, palpitations or shortness of breath.  Hip Pain  There was no injury mechanism. The pain is present in the right hip. The quality of the pain is described as aching. The pain is moderate. Associated symptoms include tingling. Pertinent negatives include no loss of motion or loss of sensation. She has tried NSAIDs (MRI by pain management but never tried the injections recommended.) for the symptoms.    Review of Systems   Constitutional:  Negative for fatigue and unexpected weight change.  HENT:  Negative for trouble swallowing.   Eyes:  Negative for visual disturbance.  Respiratory:  Negative for cough, chest tightness, shortness of breath and wheezing.   Cardiovascular:  Negative for chest pain, palpitations and leg swelling.  Gastrointestinal:  Positive for constipation. Negative for abdominal pain and diarrhea.  Genitourinary:  Positive for vaginal discharge (and itching).  Musculoskeletal:  Negative for arthralgias and myalgias.  Neurological:  Positive for tingling. Negative for dizziness, weakness, light-headedness and headaches.  Psychiatric/Behavioral:  Negative for dysphoric mood and sleep disturbance. The patient is not nervous/anxious.      Lab Results  Component Value Date   NA 140 11/02/2022   K 4.4 11/02/2022   CO2 28 11/02/2022   GLUCOSE 88 11/02/2022   BUN 21 11/02/2022   CREATININE 0.74 11/02/2022   CALCIUM 10.4 11/02/2022   EGFR 82 07/01/2021   GFRNONAA 98 09/10/2015   Lab Results  Component Value Date   CHOL 178 11/02/2022   HDL 70.70 11/02/2022   LDLCALC 96 11/02/2022   TRIG 56.0 11/02/2022   CHOLHDL 3 11/02/2022   Lab Results  Component Value Date   TSH 1.650 12/28/2020   Lab Results  Component Value Date   HGBA1C 7.4 (A) 02/13/2023   Lab Results  Component Value Date   WBC 5.2 07/01/2021   HGB 14.5 07/01/2021   HCT 44.4 07/01/2021   MCV 93 07/01/2021   PLT 284  07/01/2021   Lab Results  Component Value Date   ALT 35 11/02/2022   AST 30 11/02/2022   ALKPHOS 83 11/02/2022   BILITOT 0.6 11/02/2022   Lab Results  Component Value Date   VD25OH 34.0 12/28/2020     Patient Active Problem List   Diagnosis Date Noted   Palpitations 03/22/2023   Lumbar radiculopathy 02/22/2023   Trigger ring finger of right hand 07/23/2022   Cervical radicular pain (left C5,6) 03/17/2021   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/2021   Chronic pain syndrome  03/17/2021   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/2021   Cervical paraspinal muscle spasm 02/10/2021   Gastroesophageal reflux disease 12/28/2020   History of colonic polyps    Polyp of sigmoid colon    Abdominal pain, epigastric    Patient noncompliant with statin medication 09/12/2019   Type II diabetes mellitus with complication (HCC) 06/04/2019   Hyperlipidemia associated with type 2 diabetes mellitus (HCC) 05/17/2018   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 02/22/2018   Chronic midline thoracic back pain 02/22/2018   Tinea versicolor 09/20/2016   OSA (obstructive sleep apnea) 08/23/2016   Allergic rhinitis 07/13/2016   Overweight (BMI 25.0-29.9) 10/01/2015   Vitamin D deficiency 09/18/2014   Onychomycosis 09/16/2014   Degenerative disc disease, lumbar 09/16/2014   Benign essential HTN 10/10/2010   Chronic right-sided low back pain with right-sided sciatica 10/10/2010   Osteoporosis 10/10/2010    Allergies  Allergen Reactions   Ace Inhibitors Cough   Wheat Hives   Gramineae Pollens     Past Surgical History:  Procedure Laterality Date   ABDOMINAL HYSTERECTOMY     CATARACT EXTRACTION Right 2020   COLONOSCOPY WITH PROPOFOL N/A 10/10/2019   Procedure: COLONOSCOPY WITH PROPOFOL;  Surgeon: Marnee Sink, MD;  Location: [facility];  Service: Endoscopy;  Laterality: N/A;   ESOPHAGOGASTRODUODENOSCOPY (EGD) WITH PROPOFOL N/A 10/10/2019   Procedure: ESOPHAGOGASTRODUODENOSCOPY (EGD) WITH PROPOFOL;  Surgeon: Marnee Sink, MD;  Location: [facility];  Service: Endoscopy;  Laterality: N/A;  Diabetic - insulin pump and oral meds sleep apnea   POLYPECTOMY  10/10/2019   Procedure: POLYPECTOMY;  Surgeon: Marnee Sink, MD;  Location: [facility];  Service: Endoscopy;;    Social History   Tobacco Use   Smoking status: Never   Smokeless tobacco: Never  Vaping Use   Vaping status: Never Used  Substance Use Topics   Alcohol use: Not  Currently    Alcohol/week: 1.0 standard drink of alcohol    Types: 1 Standard drinks or equivalent per week   Drug use: Never     Medication list has been reviewed and updated.  Current Meds  Medication Sig   acetaminophen (TYLENOL) 500 MG tablet Take 1,000 mg by mouth 2 (two) times daily. As needed   amLODipine (NORVASC) 10 MG tablet Take 1 tablet (10 mg total) by mouth 2 (two) times daily.   Apoaequorin (PREVAGEN PO) Take by mouth.   Calcium Carbonate-Vit D-Min (CALCIUM 1200 PO) Take 2 each by mouth daily.   Cholecalciferol (VITAMIN D) 2000 UNITS CAPS Take 1 capsule (2,000 Units total) by mouth daily.   clotrimazole-betamethasone (LOTRISONE) cream Apply 1 Application topically daily. To feet and rash under breasts   Continuous Glucose Receiver (FREESTYLE LIBRE 3 READER) DEVI Use to monitor glucose levels   Continuous Glucose Sensor (FREESTYLE LIBRE 2 SENSOR) MISC Place 1 sensor on the skin every 14 days. Use to check glucose continuously   Continuous Glucose Sensor (FREESTYLE LIBRE 3  PLUS SENSOR) MISC 1 each by Does not apply route continuous. Change every 15 days.   Continuous Glucose Sensor (FREESTYLE LIBRE 3 SENSOR) MISC Place 1 sensor on the skin every 14 days. Use to check glucose continuously   gabapentin (NEURONTIN) 100 MG capsule Take 1-3 capsules (100-300 mg total) by mouth at bedtime. Follow written titration schedule. (Patient taking differently: Take 100-300 mg by mouth at bedtime. Patient is taking prn for hand pain/numbness)   GINKGO BILOBA COMPLEX PO Take 1 capsule by mouth daily.   glipiZIDE (GLUCOTROL) 5 MG tablet Take 10 mg by mouth daily before breakfast.   Glucagon (GVOKE HYPOPEN 1-PACK) 1 MG/0.2ML SOAJ Inject 1 mg into the skin as needed (low blood sugar with impaired consciousness).   glucose blood (ONETOUCH VERIO) test strip USE 1 STRIP TO CHECK GLUCOSE 4 TIMES DAILY   Insulin Pen Needle (PEN NEEDLES) 32G X 5 MM MISC Please   meloxicam (MOBIC) 15 MG tablet Take 1  tablet by mouth once daily (Patient taking differently: Take 15 mg by mouth as needed.)   NON FORMULARY CPAP @@ 9 cm H2O nightly   nystatin cream (MYCOSTATIN) Apply 1 Application topically 2 (two) times daily.   pravastatin (PRAVACHOL) 20 MG tablet Take 1 tablet by mouth once daily (Patient taking differently: Take 20 mg by mouth 3 (three) times a week.)   Semaglutide (OZEMPIC, 1 MG/DOSE, [place]) Inject into the skin.   triamcinolone cream (KENALOG) 0.5 % Apply 1 Application topically 3 (three) times daily. To rash on neck   [DISCONTINUED] glipiZIDE (GLUCOTROL XL) 5 MG 24 hr tablet Take 5 mg by mouth daily.   [DISCONTINUED] metFORMIN (GLUCOPHAGE-XR) 500 MG 24 hr tablet Take 1 tablet by mouth 2 (two) times daily.       03/22/2023    8:15 AM 02/19/2023    2:08 PM 01/02/2023    2:51 PM 11/29/2022    8:47 AM  GAD 7 : Generalized Anxiety Score  Nervous, Anxious, on Edge 0 0 0 1  Control/stop worrying 1 0 1 0  Worry too much - different things 1 1 0 1  Trouble relaxing 1 0 0 0  Restless 0 0 0 0  Easily annoyed or irritable 0 0 0 0  Afraid - awful might happen 0 0 0 0  Total GAD 7 Score 3 1 1 2   Anxiety Difficulty Not difficult at all Not difficult at all Not difficult at all Not difficult at all       03/22/2023    8:15 AM 02/19/2023    2:08 PM 01/16/2023   11:39 AM  Depression screen PHQ 2/9  Decreased Interest 0 0 0  Down, Depressed, Hopeless 0 0 0  PHQ - 2 Score 0 0 0  Altered sleeping 0 0   Tired, decreased energy 1 1   Change in appetite 1 0   Feeling bad or failure about yourself  1 0   Trouble concentrating 0 1   Moving slowly or fidgety/restless 0 0   Suicidal thoughts 0 0   PHQ-9 Score 3 2   Difficult doing work/chores Not difficult at all Not difficult at all     BP Readings from Last 3 Encounters:  07/18/23 114/70  06/21/23 128/64  04/23/23 130/74    Physical Exam Vitals and nursing note reviewed.  Constitutional:      General: She is not in acute distress.     Appearance: She is well-developed.  HENT:     Head: Normocephalic and  atraumatic.     Right Ear: Tympanic membrane and ear canal normal.     Left Ear: Tympanic membrane and ear canal normal.     Nose:     Right Sinus: No maxillary sinus tenderness.     Left Sinus: No maxillary sinus tenderness.  Eyes:     General: No scleral icterus.       Right eye: No discharge.        Left eye: No discharge.     Conjunctiva/sclera: Conjunctivae normal.  Neck:     Thyroid: No thyromegaly.     Vascular: No carotid bruit.  Cardiovascular:     Rate and Rhythm: Normal rate and regular rhythm.     Pulses: Normal pulses.     Heart sounds: Normal heart sounds.  Pulmonary:     Effort: Pulmonary effort is normal. No respiratory distress.     Breath sounds: No wheezing.  Abdominal:     General: Bowel sounds are normal.     Palpations: Abdomen is soft.     Tenderness: There is no abdominal tenderness.  Musculoskeletal:     Cervical back: Normal range of motion. No erythema.     Right lower leg: No edema.     Left lower leg: No edema.  Lymphadenopathy:     Cervical: No cervical adenopathy.  Skin:    General: Skin is warm and dry.     Findings: No rash.  Neurological:     Mental Status: She is alert and oriented to person, place, and time.     Cranial Nerves: No cranial nerve deficit.     Sensory: No sensory deficit.     Deep Tendon Reflexes: Reflexes are normal and symmetric.  Psychiatric:        Attention and Perception: Attention normal.        Mood and Affect: Mood normal.    Diabetic Foot Exam - Simple   Simple Foot Form Diabetic Foot exam was performed with the following findings: Yes 07/18/2023  3:15 PM  Visual Inspection No deformities, no ulcerations, no other skin breakdown bilaterally: Yes Sensation Testing Intact to touch and monofilament testing bilaterally: Yes Pulse Check Posterior Tibialis and Dorsalis pulse intact bilaterally: Yes Comments Thick dystrophic nails.       Wt Readings from Last 3 Encounters:  07/18/23 153 lb 4 oz (69.5 kg)  06/21/23 156 lb 9.6 oz (71 kg)  04/23/23 161 lb 3.2 oz (73.1 kg)    BP 114/70   Pulse 66   Ht 5\' 2"  (1.575 m)   Wt 153 lb 4 oz (69.5 kg)   SpO2 95%   BMI 28.03 kg/m   Assessment and Plan:  Problem List Items Addressed This Visit       Unprioritized   Benign essential HTN (Chronic)   Blood pressure is well controlled.  Current medications are amlodipine 10 mg daily. Will continue same regimen along with efforts to limit dietary sodium.       Relevant Orders   CBC with Differential/Platelet   Comprehensive metabolic panel with GFR   TSH   Chronic right-sided low back pain with right-sided sciatica   Seen by Dr. Rhesa Celeste in Nov 2024 - MRI done but she never had follow up. Recommend follow up to discuss injections.      Onychomycosis   She is very bothered by her dystrophic nails and peeling skin on both feet. Podiatry is willing to treat her with oral antifungals but wants her off of statins for  30 days prior -  She can stop pravachol three times per week and the begin medications at next Podiatry visit in one month      OSA (obstructive sleep apnea) (Chronic)   On CPAP nightly with good compliance and response to treatment.      Hyperlipidemia associated with type 2 diabetes mellitus (HCC) (Chronic)   LDL is  Lab Results  Component Value Date   LDLCALC 96 11/02/2022   Current regimen is pravastatin.  No medication side effects noted. Goal LDL is <70.       Relevant Medications   glipiZIDE (GLUCOTROL) 5 MG tablet   Other Relevant Orders   Lipid panel   Type II diabetes mellitus with complication (HCC) (Chronic)   Blood sugars have been increasing.  No recent hypoglycemic events requiring assistance. Currently medications are glimepiride and Ozempic is being titrated up by Endo. Lab Results  Component Value Date   HGBA1C 7.4 (A) 02/13/2023   Last visit metformin was stopped due to  GI SE and glucoses have risen.  Recommend she consult Endo for recommendations.       Relevant Medications   glipiZIDE (GLUCOTROL) 5 MG tablet   Other Relevant Orders   Comprehensive metabolic panel with GFR   TSH   Microalbumin / creatinine urine ratio   Gastroesophageal reflux disease (Chronic)   Reflux symptoms are controlled with diet changes.  Not taking omeprazole prescribed. Patient denies red flag symptoms - no melena, weight loss, dysphagia.       Other Visit Diagnoses       Annual physical exam    -  Primary     Encounter for screening mammogram for breast cancer       Relevant Orders   MM 3D SCREENING MAMMOGRAM BILATERAL BREAST     Subacute vaginitis       Relevant Orders   Cervicovaginal ancillary only     Long term current use of oral hypoglycemic drug           Return in about 6 months (around 01/17/2024) for HTN.    Sheron Dixons, MD [facility] [place]

## 2023-07-19 ENCOUNTER — Encounter: Payer: Self-pay | Admitting: Internal Medicine

## 2023-07-19 LAB — LIPID PANEL
Chol/HDL Ratio: 2.3 ratio (ref 0.0–4.4)
Cholesterol, Total: 199 mg/dL (ref 100–199)
HDL: 86 mg/dL (ref >39)
LDL Chol Calc (NIH): 97 mg/dL (ref 0–99)
Triglycerides: 94 mg/dL (ref 0–149)
VLDL Cholesterol Cal: 16 mg/dL (ref 5–40)

## 2023-07-19 LAB — COMPREHENSIVE METABOLIC PANEL WITH GFR
ALT: 31 IU/L (ref 0–32)
AST: 21 IU/L (ref 0–40)
Albumin: 4.3 g/dL (ref 3.8–4.8)
Alkaline Phosphatase: 98 IU/L (ref 44–121)
BUN/Creatinine Ratio: 30 — ABNORMAL HIGH (ref 12–28)
BUN: 21 mg/dL (ref 8–27)
Bilirubin Total: 0.5 mg/dL (ref 0.0–1.2)
CO2: 23 mmol/L (ref 20–29)
Calcium: 10.7 mg/dL — ABNORMAL HIGH (ref 8.7–10.3)
Chloride: 101 mmol/L (ref 96–106)
Creatinine, Ser: 0.69 mg/dL (ref 0.57–1.00)
Globulin, Total: 2.8 g/dL (ref 1.5–4.5)
Glucose: 230 mg/dL — ABNORMAL HIGH (ref 70–99)
Potassium: 4.3 mmol/L (ref 3.5–5.2)
Sodium: 138 mmol/L (ref 134–144)
Total Protein: 7.1 g/dL (ref 6.0–8.5)
eGFR: 89 mL/min/{1.73_m2} (ref >59)

## 2023-07-19 LAB — CBC WITH DIFFERENTIAL/PLATELET
Basophils Absolute: 0 10*3/uL (ref 0.0–0.2)
Basos: 0 %
EOS (ABSOLUTE): 0 10*3/uL (ref 0.0–0.4)
Eos: 1 %
Hematocrit: 48.7 % — ABNORMAL HIGH (ref 34.0–46.6)
Hemoglobin: 15.8 g/dL (ref 11.1–15.9)
Immature Grans (Abs): 0 10*3/uL (ref 0.0–0.1)
Immature Granulocytes: 0 %
Lymphocytes Absolute: 2.1 10*3/uL (ref 0.7–3.1)
Lymphs: 38 %
MCH: 30.4 pg (ref 26.6–33.0)
MCHC: 32.4 g/dL (ref 31.5–35.7)
MCV: 94 fL (ref 79–97)
Monocytes Absolute: 0.4 10*3/uL (ref 0.1–0.9)
Monocytes: 7 %
Neutrophils Absolute: 2.9 10*3/uL (ref 1.4–7.0)
Neutrophils: 54 %
Platelets: 274 10*3/uL (ref 150–450)
RBC: 5.2 x10E6/uL (ref 3.77–5.28)
RDW: 12.1 % (ref 11.7–15.4)
WBC: 5.4 10*3/uL (ref 3.4–10.8)

## 2023-07-19 LAB — MICROALBUMIN / CREATININE URINE RATIO
Creatinine, Urine: 50.8 mg/dL
Microalb/Creat Ratio: 29 mg/g{creat} (ref 0–29)
Microalbumin, Urine: 14.9 ug/mL

## 2023-07-19 LAB — TSH: TSH: 1.51 u[IU]/mL (ref 0.450–4.500)

## 2023-07-20 LAB — CERVICOVAGINAL ANCILLARY ONLY
Bacterial Vaginitis (gardnerella): NEGATIVE
Candida Glabrata: NEGATIVE
Candida Vaginitis: NEGATIVE
Chlamydia: NEGATIVE
Comment: NEGATIVE
Comment: NEGATIVE
Comment: NEGATIVE
Comment: NEGATIVE
Comment: NEGATIVE
Comment: NORMAL
Neisseria Gonorrhea: NEGATIVE
Trichomonas: NEGATIVE

## 2023-08-16 ENCOUNTER — Encounter: Payer: Self-pay | Admitting: Internal Medicine

## 2023-08-16 ENCOUNTER — Ambulatory Visit: Payer: Self-pay

## 2023-08-16 ENCOUNTER — Ambulatory Visit (INDEPENDENT_AMBULATORY_CARE_PROVIDER_SITE_OTHER): Admitting: Internal Medicine

## 2023-08-16 VITALS — BP 124/66 | HR 69 | Ht 62.0 in | Wt 153.2 lb

## 2023-08-16 DIAGNOSIS — M533 Sacrococcygeal disorders, not elsewhere classified: Secondary | ICD-10-CM

## 2023-08-16 MED ORDER — MELOXICAM 15 MG PO TABS
15.0000 mg | ORAL_TABLET | Freq: Every day | ORAL | 0 refills | Status: DC
Start: 2023-08-16 — End: 2024-01-25

## 2023-08-16 NOTE — Telephone Encounter (Signed)
 Noted  Pt has a appt.  KP

## 2023-08-16 NOTE — Progress Notes (Signed)
 Date:  08/16/2023   Name:  Melinda Ford   DOB:  [birth year]   MRN:  [id number]   Chief Complaint: Hip Pain (Patient said she is having sharp pain in her hip, left side, no longer attending the pain clinic.)  Hip Pain  The incident occurred 3 to 5 days ago. The incident occurred at home. There was no injury mechanism. The pain is present in the left hip (and sacroiliac region). The quality of the pain is described as stabbing. The pain is moderate. The pain has been Fluctuating since onset. She reports no foreign bodies present. She has tried acetaminophen  for the symptoms. The treatment provided no relief.    Review of Systems  Constitutional:  Negative for chills, fatigue and fever.  Respiratory:  Negative for chest tightness and shortness of breath.   Cardiovascular:  Negative for chest pain and leg swelling.  Musculoskeletal:  Positive for arthralgias and back pain. Negative for joint swelling.  Neurological:  Negative for dizziness and headaches.  Psychiatric/Behavioral:  Negative for sleep disturbance. The patient is not nervous/anxious.      Lab Results  Component Value Date   NA 138 07/18/2023   K 4.3 07/18/2023   CO2 23 07/18/2023   GLUCOSE 230 (H) 07/18/2023   BUN 21 07/18/2023   CREATININE 0.69 07/18/2023   CALCIUM 10.7 (H) 07/18/2023   EGFR 89 07/18/2023   GFRNONAA 98 09/10/2015   Lab Results  Component Value Date   CHOL 199 07/18/2023   HDL 86 07/18/2023   LDLCALC 97 07/18/2023   TRIG 94 07/18/2023   CHOLHDL 2.3 07/18/2023   Lab Results  Component Value Date   TSH 1.510 07/18/2023   Lab Results  Component Value Date   HGBA1C 7.4 (A) 02/13/2023   Lab Results  Component Value Date   WBC 5.4 07/18/2023   HGB 15.8 07/18/2023   HCT 48.7 (H) 07/18/2023   MCV 94 07/18/2023   PLT 274 07/18/2023   Lab Results  Component Value Date   ALT 31 07/18/2023   AST 21 07/18/2023   ALKPHOS 98 07/18/2023   BILITOT 0.5 07/18/2023   Lab Results  Component  Value Date   VD25OH 34.0 12/28/2020     Patient Active Problem List   Diagnosis Date Noted   Palpitations 03/22/2023   Lumbar radiculopathy 02/22/2023   Trigger ring finger of right hand 07/23/2022   Cervical radicular pain (left C5,6) 03/17/2021   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/2021   Chronic pain syndrome 03/17/2021   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/2021   Cervical paraspinal muscle spasm 02/10/2021   Gastroesophageal reflux disease 12/28/2020   History of colonic polyps    Polyp of sigmoid colon    Abdominal pain, epigastric    Patient noncompliant with statin medication 09/12/2019   Type II diabetes mellitus with complication (HCC) 06/04/2019   Hyperlipidemia associated with type 2 diabetes mellitus (HCC) 05/17/2018   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 02/22/2018   Chronic midline thoracic back pain 02/22/2018   Tinea versicolor 09/20/2016   OSA (obstructive sleep apnea) 08/23/2016   Allergic rhinitis 07/13/2016   Overweight (BMI 25.0-29.9) 10/01/2015   Vitamin D  deficiency 09/18/2014   Onychomycosis 09/16/2014   Degenerative disc disease, lumbar 09/16/2014   Benign essential HTN 10/10/2010   Chronic right-sided low back pain with right-sided sciatica 10/10/2010   Osteoporosis 10/10/2010    Allergies  Allergen Reactions   Ace Inhibitors Cough   Wheat Hives  Gramineae Pollens     Past Surgical History:  Procedure Laterality Date   ABDOMINAL HYSTERECTOMY     CATARACT EXTRACTION Right 2020   COLONOSCOPY WITH PROPOFOL  N/A 10/10/2019   Procedure: COLONOSCOPY WITH PROPOFOL ;  Surgeon: Marnee Sink, MD;  Location: [facility];  Service: Endoscopy;  Laterality: N/A;   ESOPHAGOGASTRODUODENOSCOPY (EGD) WITH PROPOFOL  N/A 10/10/2019   Procedure: ESOPHAGOGASTRODUODENOSCOPY (EGD) WITH PROPOFOL ;  Surgeon: Marnee Sink, MD;  Location: [facility];  Service: Endoscopy;  Laterality: N/A;  Diabetic - insulin  pump and oral  meds sleep apnea   POLYPECTOMY  10/10/2019   Procedure: POLYPECTOMY;  Surgeon: Marnee Sink, MD;  Location: [facility];  Service: Endoscopy;;    Social History   Tobacco Use   Smoking status: Never   Smokeless tobacco: Never  Vaping Use   Vaping status: Never Used  Substance Use Topics   Alcohol use: Not Currently    Alcohol/week: 1.0 standard drink of alcohol    Types: 1 Standard drinks or equivalent per week   Drug use: Never     Medication list has been reviewed and updated.  Current Meds  Medication Sig   acetaminophen  (TYLENOL ) 500 MG tablet Take 1,000 mg by mouth 2 (two) times daily. As needed   amLODipine  (NORVASC ) 10 MG tablet Take 1 tablet (10 mg total) by mouth 2 (two) times daily.   Apoaequorin (PREVAGEN PO) Take by mouth.   Calcium Carbonate-Vit D-Min (CALCIUM 1200 PO) Take 2 each by mouth daily.   Cholecalciferol (VITAMIN D ) 2000 UNITS CAPS Take 1 capsule (2,000 Units total) by mouth daily.   clotrimazole -betamethasone  (LOTRISONE ) cream Apply 1 Application topically daily. To feet and rash under breasts   Continuous Glucose Receiver (FREESTYLE LIBRE 3 READER) DEVI Use to monitor glucose levels   Continuous Glucose Sensor (FREESTYLE LIBRE 2 SENSOR) MISC Place 1 sensor on the skin every 14 days. Use to check glucose continuously   Continuous Glucose Sensor (FREESTYLE LIBRE 3 PLUS SENSOR) MISC 1 each by Does not apply route continuous. Change every 15 days.   Continuous Glucose Sensor (FREESTYLE LIBRE 3 SENSOR) MISC Place 1 sensor on the skin every 14 days. Use to check glucose continuously   glucose blood (ONETOUCH VERIO) test strip USE 1 STRIP TO CHECK GLUCOSE 4 TIMES DAILY   insulin  lispro (HUMALOG ) 100 UNIT/ML injection Up to 90 units daily in pump   Insulin  Pen Needle (PEN NEEDLES) 32G X 5 MM MISC Please   NON FORMULARY CPAP @@ 9 cm H2O nightly   nystatin cream (MYCOSTATIN) Apply 1 Application topically 2 (two) times daily.   pravastatin  (PRAVACHOL ) 20 MG  tablet Take 1 tablet by mouth once daily (Patient taking differently: Take 20 mg by mouth 3 (three) times a week.)   Semaglutide  (OZEMPIC , 1 MG/DOSE, [place]) Inject into the skin.   triamcinolone  cream (KENALOG ) 0.5 % Apply 1 Application topically 3 (three) times daily. To rash on neck       08/16/2023    9:20 AM 03/22/2023    8:15 AM 02/19/2023    2:08 PM 01/02/2023    2:51 PM  GAD 7 : Generalized Anxiety Score  Nervous, Anxious, on Edge 0 0 0 0  Control/stop worrying 1 1 0 1  Worry too much - different things 1 1 1  0  Trouble relaxing 1 1 0 0  Restless 0 0 0 0  Easily annoyed or irritable 0 0 0 0  Afraid - awful might happen 0 0 0 0  Total  GAD 7 Score 3 3 1 1   Anxiety Difficulty Not difficult at all Not difficult at all Not difficult at all Not difficult at all       08/16/2023    9:19 AM 03/22/2023    8:15 AM 02/19/2023    2:08 PM  Depression screen PHQ 2/9  Decreased Interest 0 0 0  Down, Depressed, Hopeless 0 0 0  PHQ - 2 Score 0 0 0  Altered sleeping 0 0 0  Tired, decreased energy 1 1 1   Change in appetite 0 1 0  Feeling bad or failure about yourself  0 1 0  Trouble concentrating 0 0 1  Moving slowly or fidgety/restless 0 0 0  Suicidal thoughts 0 0 0  PHQ-9 Score 1 3 2   Difficult doing work/chores Not difficult at all Not difficult at all Not difficult at all    BP Readings from Last 3 Encounters:  08/16/23 124/66  07/18/23 114/70  06/21/23 128/64    Physical Exam Vitals and nursing note reviewed.  Constitutional:      General: She is not in acute distress.    Appearance: Normal appearance. She is well-developed.  HENT:     Head: Normocephalic and atraumatic.  Cardiovascular:     Rate and Rhythm: Normal rate and regular rhythm.  Pulmonary:     Effort: Pulmonary effort is normal. No respiratory distress.     Breath sounds: No wheezing or rhonchi.  Musculoskeletal:     Lumbar back: Tenderness (over left SI region) present. Decreased range of motion. Negative  right straight leg raise test and negative left straight leg raise test.  Skin:    General: Skin is warm and dry.     Findings: No rash.  Neurological:     Mental Status: She is alert and oriented to person, place, and time.     Gait: Gait is intact.     Deep Tendon Reflexes:     Reflex Scores:      Patellar reflexes are 2+ on the right side and 2+ on the left side. Psychiatric:        Mood and Affect: Mood normal.        Behavior: Behavior normal.     Wt Readings from Last 3 Encounters:  08/16/23 153 lb 4 oz (69.5 kg)  07/18/23 153 lb 4 oz (69.5 kg)  06/21/23 156 lb 9.6 oz (71 kg)    BP 124/66   Pulse 69   Ht 5\' 2"  (1.575 m)   Wt 153 lb 4 oz (69.5 kg)   SpO2 97%   BMI 28.03 kg/m   Assessment and Plan:  Problem List Items Addressed This Visit       Unprioritized   Degenerative disc disease, lumbar   Other Visit Diagnoses       Sacroiliac joint pain    -  Primary   Resume Mobid 15 mg daily use heat three times per day can augment with Tylenol  tid if needed   Relevant Medications   meloxicam  (MOBIC ) 15 MG tablet       No follow-ups on file.    Sheron Dixons, MD [facility] [place]

## 2023-08-16 NOTE — Telephone Encounter (Signed)
 Copied from CRM [id number]. Topic: Clinical - Red Word Triage >> Aug 16, 2023  8:21 AM Elle L wrote: Red Word that prompted transfer to Nurse Triage: The patient is having pain in her left hip that is causing her to be unable to walk.   Chief Complaint: Hip Pain, Left  Symptoms: Hip Pain, Tingling  Frequency: Acute  Pertinent Negatives: Patient denies redness,  Disposition: [] ED /[] Urgent Care (no appt availability in office) / [x] Appointment(In office/virtual)/ []  [facility]/ [] Home Care/ [] Refused Recommended Disposition /[] [place]/ []  Follow-up with PCP Additional Notes: IC Is being triaged for left hip pain that is described as sharp. The patient denies the pain running down her hip and leg, but describes a somewhat tingling sensation in her ankle and foot. The patient states the pain causes her to limp sometimes, and is relievable by Tylenol . In office appointment made for this morning.   Reason for Disposition  [1] MODERATE pain (e.g., interferes with normal activities, limping) AND [2] present > 3 days  Answer Assessment - Initial Assessment Questions 1. LOCATION and RADIATION: "Where is the pain located?"      Left Hip  2. QUALITY: "What does the pain feel like?"  (e.g., sharp, dull, aching, burning)     Sharp  3. SEVERITY: "How bad is the pain?" "What does it keep you from doing?"   (Scale 1-10; or mild, moderate, severe)   -  MILD (1-3): doesn't interfere with normal activities    -  MODERATE (4-7): interferes with normal activities (e.g., work or school) or awakens from sleep, limping    -  SEVERE (8-10): excruciating pain, unable to do any normal activities, unable to walk     Moderate  4. ONSET: "When did the pain start?" "Does it come and go, or is it there all the time?"     This Week  5. WORK OR EXERCISE: "Has there been any recent work or exercise that involved this part of the body?"      No  6. CAUSE: "What do you think is causing the  hip pain?"      Unsure  7. AGGRAVATING FACTORS: "What makes the hip pain worse?" (e.g., walking, climbing stairs, running)     Unsure  8. OTHER SYMPTOMS: "Do you have any other symptoms?" (e.g., back pain, pain shooting down leg,  fever, rash)     Tingling  Protocols used: Hip Pain-A-AH

## 2023-08-21 IMAGING — MR MR CERVICAL SPINE W/O CM
5 series · 43 of 48 positions shown · non-contrast
Comparison: Radiographs 02/10/2021

CLINICAL DATA: Chronic neck pain and cervicalgia. Left arm pain
since February 2021 with numbness and tingling in the left arm.

EXAM:
MRI CERVICAL SPINE WITHOUT CONTRAST
TECHNIQUE: Multiplanar, multisequence MR imaging of the cervical spine was
performed. No intravenous contrast was administered.

[Series 2: STIR · sagittal · 3.0mm · 0.69mm/px · 6 of 13 slices shown]
[im 1/13]
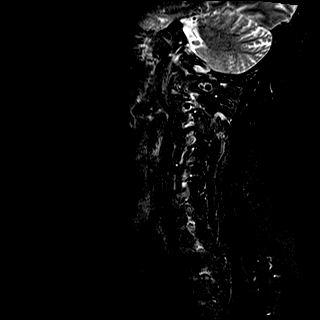
[im 3/13]
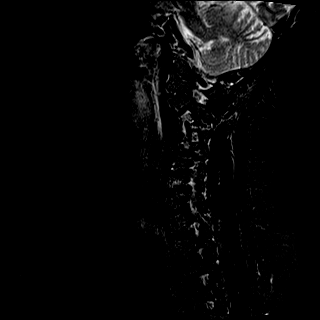
[im 5/13]
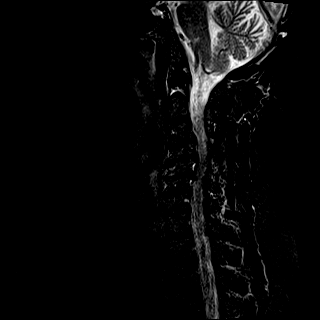
[im 8/13]
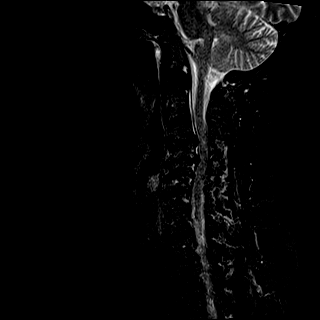
[im 10/13]
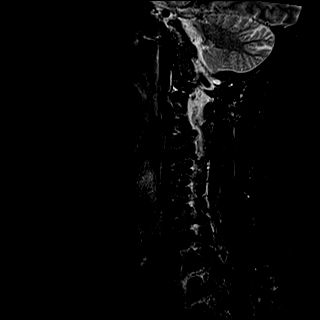
[im 13/13]
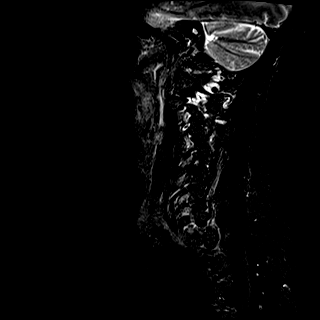

[Series 3: T2 · sagittal · 3.0mm · 0.86mm/px · 7 of 13 slices shown (1 of 2)]
[im 1/13]
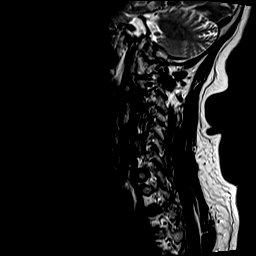
[im 3/13]
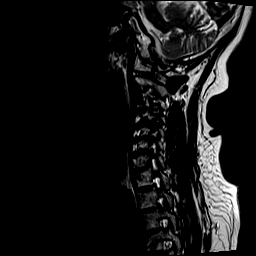
[im 5/13]
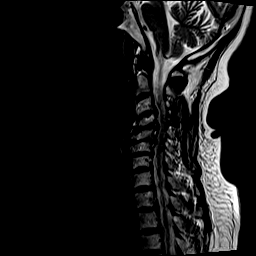
[im 7/13]
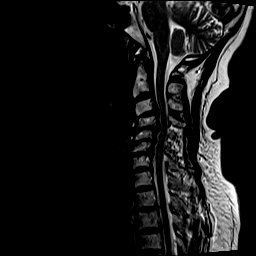
[im 9/13]
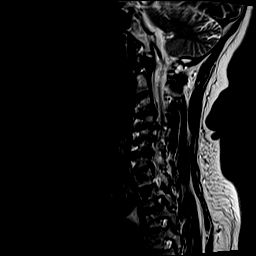
[im 11/13]
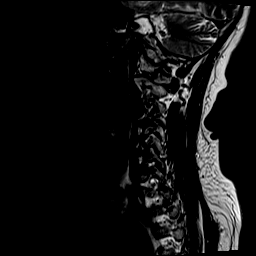
[im 13/13]
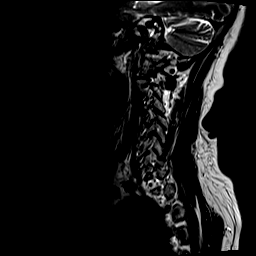

[Series 4: T1 · sagittal · 3.0mm · 0.86mm/px · 7 of 13 slices shown]
[im 1/13]
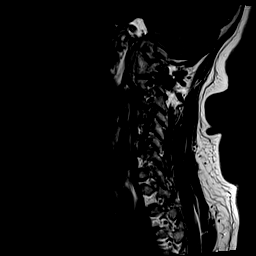
[im 3/13]
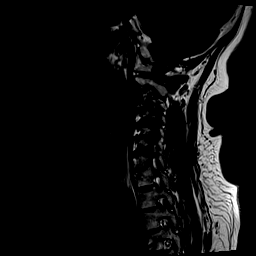
[im 5/13]
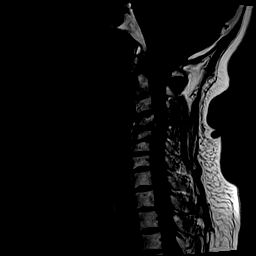
[im 7/13]
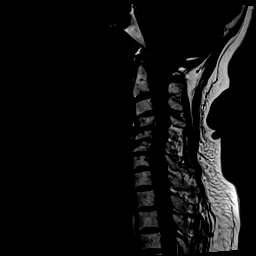
[im 9/13]
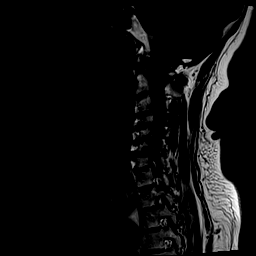
[im 11/13]
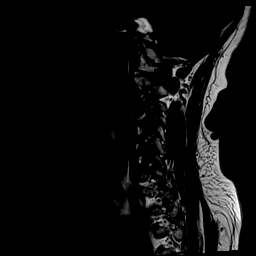
[im 13/13]
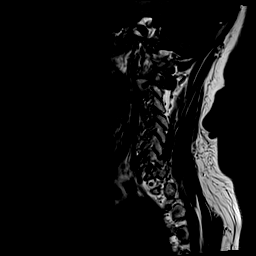

[Series 5: T2 · axial · 3.0mm · 0.78mm/px · z∈[-109,-13]mm · 14 of 27 slices shown (2 of 2)]
[im 1/27]
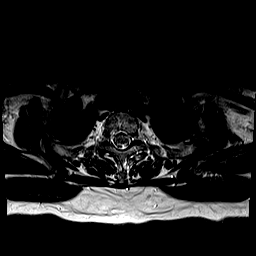
[im 3/27]
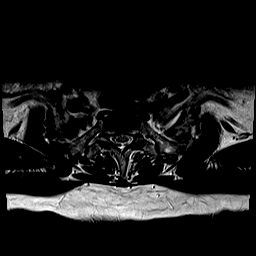
[im 5/27]
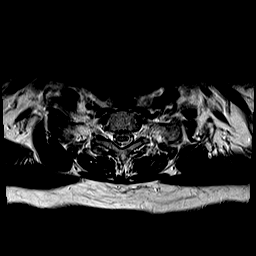
[im 7/27]
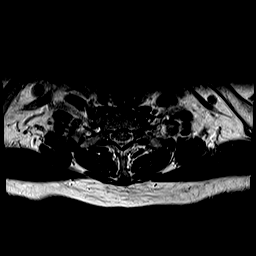
[im 9/27]
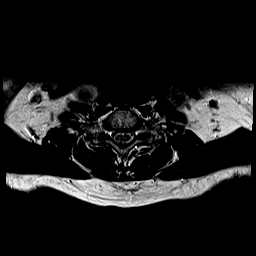
[im 11/27]
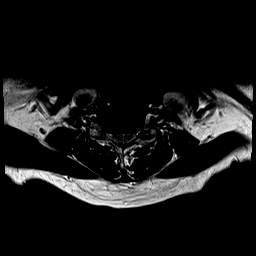
[im 13/27]
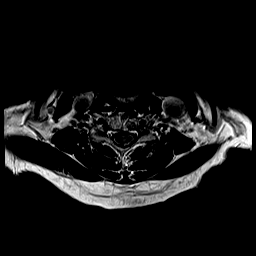
[im 15/27]
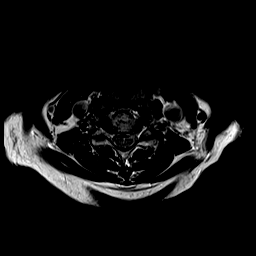
[im 17/27]
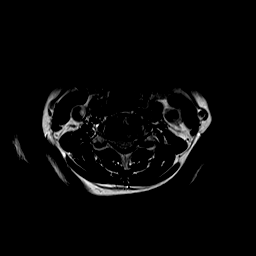
[im 19/27]
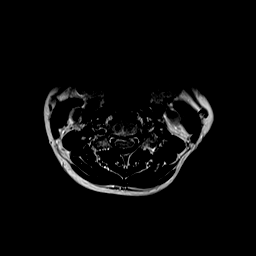
[im 21/27]
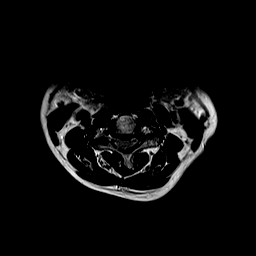
[im 23/27]
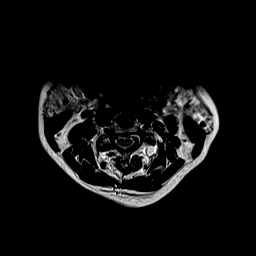
[im 25/27]
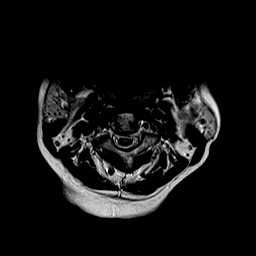
[im 27/27]
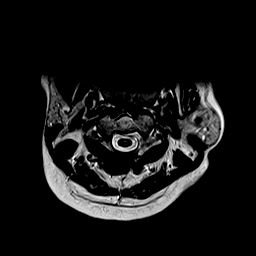

[Series 6: mpgr ax · axial · 3.0mm · 0.35mm/px · z∈[-99,-3]mm · 9 of 27 slices shown]
[im 1/27]
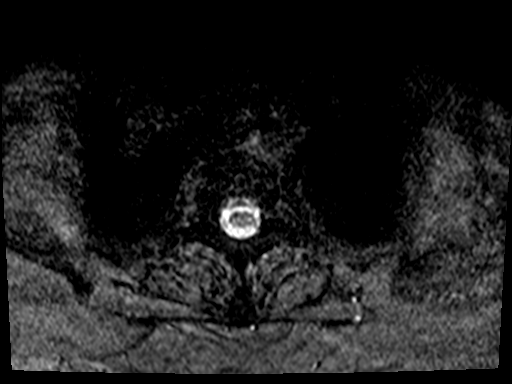
[im 3/27]
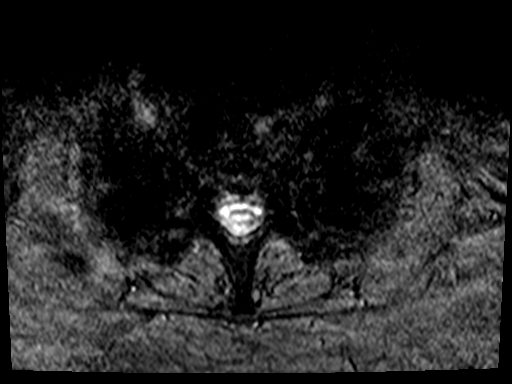
[im 5/27]
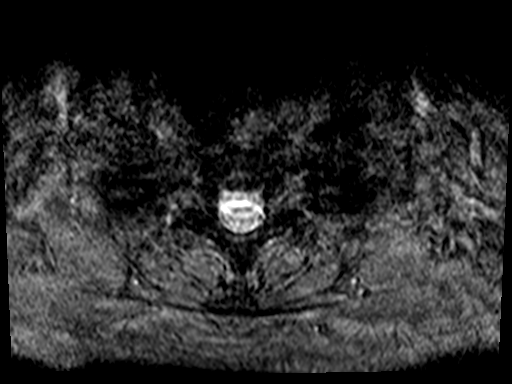
[im 9/27]
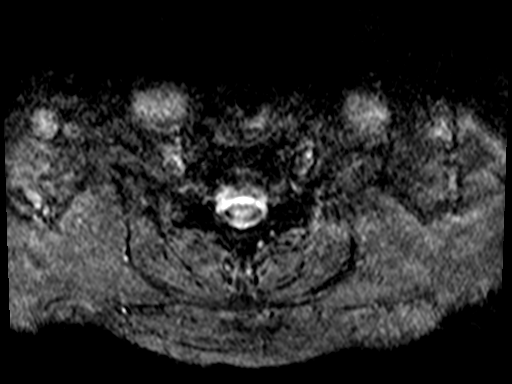
[im 13/27]
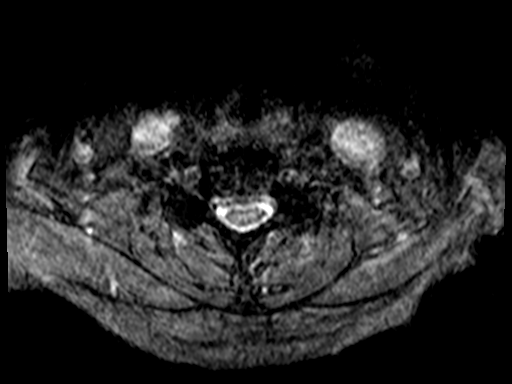
[im 15/27]
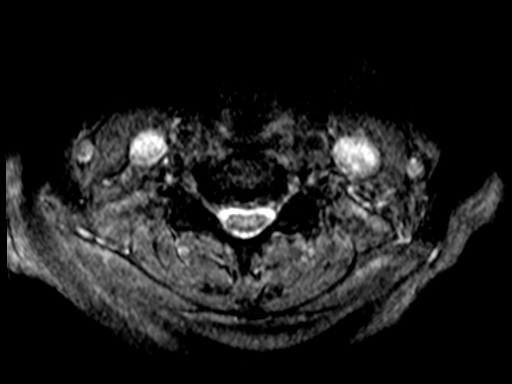
[im 19/27]
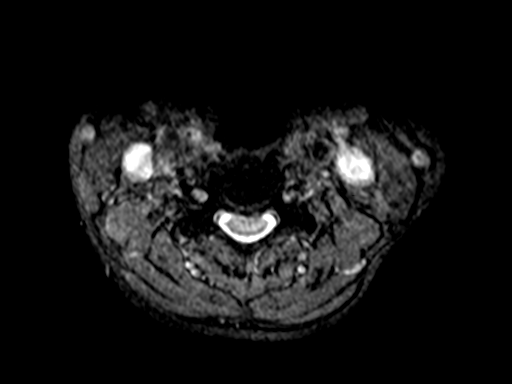
[im 23/27]
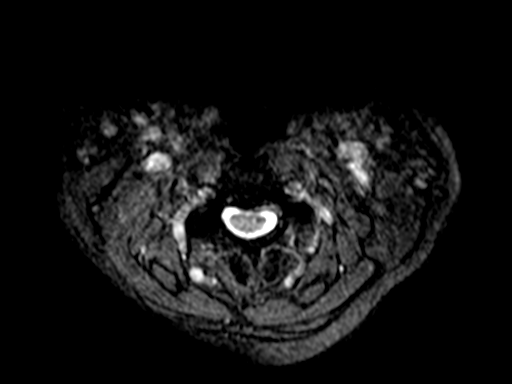
[im 27/27]
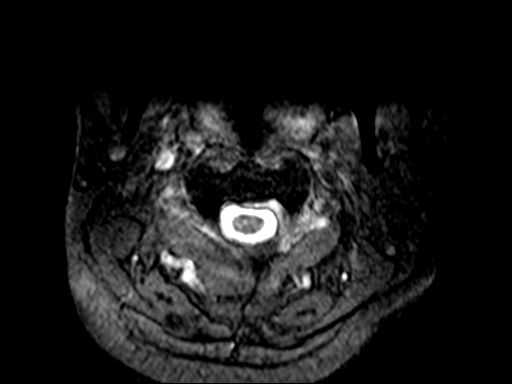

[43 of 48 positions shown; findings below may reference images not displayed]

FINDINGS: Despite efforts by the technologist and patient, motion artifact is
present on today's exam and could not be eliminated. This reduces
exam sensitivity and specificity.

Alignment: 2 mm degenerative retrolisthesis at C4-5. Mild reversal
of the normal cervical lordosis centered at the C4 level.

Vertebrae: Disc desiccation is present throughout the cervical spine
with loss of disc height particularly at C4-5 and C5-6. Degenerative
endplate findings primarily at C4-5 and C5-6.

Cord: Equivocal accentuated central cord signal at the C4-5 level on
inversion recovery weighted image 7 series 2, although not confirmed
on the dedicated T2 weighted images.

Posterior Fossa, vertebral arteries, paraspinal tissues:
Unremarkable

Disc levels:

C2-3: Borderline central narrowing of the thecal sac due to a small
central disc protrusion.

C3-4: Moderate central narrowing of the thecal sac due to short
pedicles, disc bulge, and intervertebral spurring. Left vertebral
artery loop extends substantially into the left neural foramen on
image 7 of series 5, this can occasionally cause impingement related
symptoms.

C4-5: Prominent right and moderate to prominent left foraminal
stenosis along with moderate central narrowing of the thecal sac due
to subluxation, intervertebral spurring, and disc bulge along with
uncinate spurring.

C5-6: Moderate bilateral foraminal stenosis and moderate central
narrowing of the thecal sac due to disc osteophyte complex, uncinate
spurring, and facet arthropathy.

C6-7: Mild bilateral foraminal stenosis and mild central narrowing
of the thecal sac due to disc bulge and uncinate spurring.

C7-T1: No impingement.  Mild disc bulge.
IMPRESSION: 1. Cervical spondylosis and degenerative disc disease, causing
prominent impingement at C4-5; moderate impingement at C3-4 and
C5-6; and mild impingement at C6-7.
2. Among the findings at the C3-4 level, there is a left vertebral
artery loop which extends substantially into the left neural
foramen. This can occasionally cause impingement related symptoms.
3. Equivocal accentuated central cord signal at the C4-5 level on
sagittal inversion recovery weighted images but not confirmed on the
T2 weighted images, difficult to exclude subtle central
myelomalacia.

## 2023-08-28 ENCOUNTER — Encounter: Payer: Self-pay | Admitting: Internal Medicine

## 2023-08-28 ENCOUNTER — Other Ambulatory Visit: Payer: Self-pay | Admitting: Internal Medicine

## 2023-08-28 ENCOUNTER — Ambulatory Visit (INDEPENDENT_AMBULATORY_CARE_PROVIDER_SITE_OTHER): Admitting: Internal Medicine

## 2023-08-28 VITALS — BP 132/70 | HR 79 | Temp 97.5°F | Ht 62.0 in | Wt 152.1 lb

## 2023-08-28 DIAGNOSIS — R197 Diarrhea, unspecified: Secondary | ICD-10-CM

## 2023-08-28 NOTE — Progress Notes (Unsigned)
 Date:  08/28/2023   Name:  Melinda Ford   DOB:  [birth year]   MRN:  [id number]   Chief Complaint: No chief complaint on file.  HPI  Review of Systems   Lab Results  Component Value Date   NA 138 07/18/2023   K 4.3 07/18/2023   CO2 23 07/18/2023   GLUCOSE 230 (H) 07/18/2023   BUN 21 07/18/2023   CREATININE 0.69 07/18/2023   CALCIUM 10.7 (H) 07/18/2023   EGFR 89 07/18/2023   GFRNONAA 98 09/10/2015   Lab Results  Component Value Date   CHOL 199 07/18/2023   HDL 86 07/18/2023   LDLCALC 97 07/18/2023   TRIG 94 07/18/2023   CHOLHDL 2.3 07/18/2023   Lab Results  Component Value Date   TSH 1.510 07/18/2023   Lab Results  Component Value Date   HGBA1C 7.4 (A) 02/13/2023   Lab Results  Component Value Date   WBC 5.4 07/18/2023   HGB 15.8 07/18/2023   HCT 48.7 (H) 07/18/2023   MCV 94 07/18/2023   PLT 274 07/18/2023   Lab Results  Component Value Date   ALT 31 07/18/2023   AST 21 07/18/2023   ALKPHOS 98 07/18/2023   BILITOT 0.5 07/18/2023   Lab Results  Component Value Date   VD25OH 34.0 12/28/2020     Patient Active Problem List   Diagnosis Date Noted   Palpitations 03/22/2023   Lumbar radiculopathy 02/22/2023   Trigger ring finger of right hand 07/23/2022   Cervical radicular pain (left C5,6) 03/17/2021   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/2021   Chronic pain syndrome 03/17/2021   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/2021   Cervical paraspinal muscle spasm 02/10/2021   Gastroesophageal reflux disease 12/28/2020   History of colonic polyps    Polyp of sigmoid colon    Abdominal pain, epigastric    Patient noncompliant with statin medication 09/12/2019   Type II diabetes mellitus with complication (HCC) 06/04/2019   Hyperlipidemia associated with type 2 diabetes mellitus (HCC) 05/17/2018   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 02/22/2018   Chronic midline thoracic back pain 02/22/2018   Tinea versicolor  09/20/2016   OSA (obstructive sleep apnea) 08/23/2016   Allergic rhinitis 07/13/2016   Overweight (BMI 25.0-29.9) 10/01/2015   Vitamin D  deficiency 09/18/2014   Onychomycosis 09/16/2014   Degenerative disc disease, lumbar 09/16/2014   Benign essential HTN 10/10/2010   Chronic right-sided low back pain with right-sided sciatica 10/10/2010   Osteoporosis 10/10/2010    Allergies  Allergen Reactions   Ace Inhibitors Cough   Wheat Hives   Gramineae Pollens     Past Surgical History:  Procedure Laterality Date   ABDOMINAL HYSTERECTOMY     CATARACT EXTRACTION Right 2020   COLONOSCOPY WITH PROPOFOL  N/A 10/10/2019   Procedure: COLONOSCOPY WITH PROPOFOL ;  Surgeon: Marnee Sink, MD;  Location: [facility];  Service: Endoscopy;  Laterality: N/A;   ESOPHAGOGASTRODUODENOSCOPY (EGD) WITH PROPOFOL  N/A 10/10/2019   Procedure: ESOPHAGOGASTRODUODENOSCOPY (EGD) WITH PROPOFOL ;  Surgeon: Marnee Sink, MD;  Location: [facility];  Service: Endoscopy;  Laterality: N/A;  Diabetic - insulin  pump and oral meds sleep apnea   POLYPECTOMY  10/10/2019   Procedure: POLYPECTOMY;  Surgeon: Marnee Sink, MD;  Location: [facility];  Service: Endoscopy;;    Social History   Tobacco Use   Smoking status: Never   Smokeless tobacco: Never  Vaping Use   Vaping status: Never Used  Substance Use Topics   Alcohol  use: Not Currently    Alcohol/week: 1.0 standard drink of alcohol    Types: 1 Standard drinks or equivalent per week   Drug use: Never     Medication list has been reviewed and updated.  No outpatient medications have been marked as taking for the 08/28/23 encounter (Orders Only) with Sheron Dixons, MD.       08/16/2023    9:20 AM 03/22/2023    8:15 AM 02/19/2023    2:08 PM 01/02/2023    2:51 PM  GAD 7 : Generalized Anxiety Score  Nervous, Anxious, on Edge 0 0 0 0  Control/stop worrying 1 1 0 1  Worry too much - different things 1 1 1  0  Trouble relaxing 1 1 0 0   Restless 0 0 0 0  Easily annoyed or irritable 0 0 0 0  Afraid - awful might happen 0 0 0 0  Total GAD 7 Score 3 3 1 1   Anxiety Difficulty Not difficult at all Not difficult at all Not difficult at all Not difficult at all       08/16/2023    9:19 AM 03/22/2023    8:15 AM 02/19/2023    2:08 PM  Depression screen PHQ 2/9  Decreased Interest 0 0 0  Down, Depressed, Hopeless 0 0 0  PHQ - 2 Score 0 0 0  Altered sleeping 0 0 0  Tired, decreased energy 1 1 1   Change in appetite 0 1 0  Feeling bad or failure about yourself  0 1 0  Trouble concentrating 0 0 1  Moving slowly or fidgety/restless 0 0 0  Suicidal thoughts 0 0 0  PHQ-9 Score 1 3 2   Difficult doing work/chores Not difficult at all Not difficult at all Not difficult at all    BP Readings from Last 3 Encounters:  08/16/23 124/66  07/18/23 114/70  06/21/23 128/64    Physical Exam  Wt Readings from Last 3 Encounters:  08/16/23 153 lb 4 oz (69.5 kg)  07/18/23 153 lb 4 oz (69.5 kg)  06/21/23 156 lb 9.6 oz (71 kg)    There were no vitals taken for this visit.  Assessment and Plan:  Problem List Items Addressed This Visit   None   No follow-ups on file.    Sheron Dixons, MD [facility] [place]

## 2023-08-28 NOTE — Progress Notes (Signed)
 Date:  08/28/2023   Name:  Melinda Ford   DOB:  1945-06-10   MRN:  [id number]   Chief Complaint: Diarrhea (Since Sunday night, diarrhea, x 5 times a day, not much abdominal pain, no fevers, tired OTC medication but no relief ) and Vomiting (Monday morning, has only vomited once )  Diarrhea  This is a new problem. Episode onset: 2 days ago. The problem occurs 5 to 10 times per day. The stool consistency is described as Watery. Pertinent negatives include no abdominal pain, chills or fever.    Review of Systems  Constitutional:  Negative for chills, fatigue and fever.  HENT:  Negative for trouble swallowing.   Respiratory:  Negative for chest tightness and shortness of breath.   Cardiovascular:  Negative for chest pain and palpitations.  Gastrointestinal:  Positive for diarrhea. Negative for abdominal pain and blood in stool.  Psychiatric/Behavioral:  Negative for sleep disturbance.      Lab Results  Component Value Date   NA 138 07/18/2023   K 4.3 07/18/2023   CO2 23 07/18/2023   GLUCOSE 230 (H) 07/18/2023   BUN 21 07/18/2023   CREATININE 0.69 07/18/2023   CALCIUM 10.7 (H) 07/18/2023   EGFR 89 07/18/2023   GFRNONAA 98 09/10/2015   Lab Results  Component Value Date   CHOL 199 07/18/2023   HDL 86 07/18/2023   LDLCALC 97 07/18/2023   TRIG 94 07/18/2023   CHOLHDL 2.3 07/18/2023   Lab Results  Component Value Date   TSH 1.510 07/18/2023   Lab Results  Component Value Date   HGBA1C 7.4 (A) 02/13/2023   Lab Results  Component Value Date   WBC 5.4 07/18/2023   HGB 15.8 07/18/2023   HCT 48.7 (H) 07/18/2023   MCV 94 07/18/2023   PLT 274 07/18/2023   Lab Results  Component Value Date   ALT 31 07/18/2023   AST 21 07/18/2023   ALKPHOS 98 07/18/2023   BILITOT 0.5 07/18/2023   Lab Results  Component Value Date   VD25OH 34.0 12/28/2020     Patient Active Problem List   Diagnosis Date Noted   Palpitations 03/22/2023   Lumbar radiculopathy 02/22/2023    Trigger ring finger of right hand 07/23/2022   Cervical radicular pain (left C5,6) 03/17/2021   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/2021   Chronic pain syndrome 03/17/2021   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/2021   Cervical paraspinal muscle spasm 02/10/2021   Gastroesophageal reflux disease 12/28/2020   History of colonic polyps    Polyp of sigmoid colon    Abdominal pain, epigastric    Patient noncompliant with statin medication 09/12/2019   Type II diabetes mellitus with complication (HCC) 06/04/2019   Hyperlipidemia associated with type 2 diabetes mellitus (HCC) 05/17/2018   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 02/22/2018   Chronic midline thoracic back pain 02/22/2018   Tinea versicolor 09/20/2016   OSA (obstructive sleep apnea) 08/23/2016   Allergic rhinitis 07/13/2016   Overweight (BMI 25.0-29.9) 10/01/2015   Vitamin D  deficiency 09/18/2014   Onychomycosis 09/16/2014   Degenerative disc disease, lumbar 09/16/2014   Benign essential HTN 10/10/2010   Chronic right-sided low back pain with right-sided sciatica 10/10/2010   Osteoporosis 10/10/2010    Allergies  Allergen Reactions   Ace Inhibitors Cough   Wheat Hives   Gramineae Pollens     Past Surgical History:  Procedure Laterality Date   ABDOMINAL HYSTERECTOMY     CATARACT EXTRACTION Right 2020  COLONOSCOPY WITH PROPOFOL  N/A 10/10/2019   Procedure: COLONOSCOPY WITH PROPOFOL ;  Surgeon: Marnee Sink, MD;  Location: [facility];  Service: Endoscopy;  Laterality: N/A;   ESOPHAGOGASTRODUODENOSCOPY (EGD) WITH PROPOFOL  N/A 10/10/2019   Procedure: ESOPHAGOGASTRODUODENOSCOPY (EGD) WITH PROPOFOL ;  Surgeon: Marnee Sink, MD;  Location: [facility];  Service: Endoscopy;  Laterality: N/A;  Diabetic - insulin  pump and oral meds sleep apnea   POLYPECTOMY  10/10/2019   Procedure: POLYPECTOMY;  Surgeon: Marnee Sink, MD;  Location: [facility];  Service: Endoscopy;;     Social History   Tobacco Use   Smoking status: Never   Smokeless tobacco: Never  Vaping Use   Vaping status: Never Used  Substance Use Topics   Alcohol use: Not Currently    Alcohol/week: 1.0 standard drink of alcohol    Types: 1 Standard drinks or equivalent per week   Drug use: Never     Medication list has been reviewed and updated.  Current Meds  Medication Sig   acetaminophen  (TYLENOL ) 500 MG tablet Take 1,000 mg by mouth 2 (two) times daily. As needed   amLODipine  (NORVASC ) 10 MG tablet Take 1 tablet (10 mg total) by mouth 2 (two) times daily.   Apoaequorin (PREVAGEN PO) Take by mouth.   Calcium Carbonate-Vit D-Min (CALCIUM 1200 PO) Take 2 each by mouth daily.   Cholecalciferol (VITAMIN D ) 2000 UNITS CAPS Take 1 capsule (2,000 Units total) by mouth daily.   clotrimazole -betamethasone  (LOTRISONE ) cream Apply 1 Application topically daily. To feet and rash under breasts   Continuous Glucose Receiver (FREESTYLE LIBRE 3 READER) DEVI Use to monitor glucose levels   Continuous Glucose Sensor (FREESTYLE LIBRE 2 SENSOR) MISC Place 1 sensor on the skin every 14 days. Use to check glucose continuously   Continuous Glucose Sensor (FREESTYLE LIBRE 3 PLUS SENSOR) MISC 1 each by Does not apply route continuous. Change every 15 days.   Continuous Glucose Sensor (FREESTYLE LIBRE 3 SENSOR) MISC Place 1 sensor on the skin every 14 days. Use to check glucose continuously   glipiZIDE (GLUCOTROL) 5 MG tablet Take 10 mg by mouth daily before breakfast.   Glucagon  (GVOKE HYPOPEN  1-PACK) 1 MG/0.2ML SOAJ Inject 1 mg into the skin as needed (low blood sugar with impaired consciousness).   glucose blood (ONETOUCH VERIO) test strip USE 1 STRIP TO CHECK GLUCOSE 4 TIMES DAILY   insulin  lispro (HUMALOG ) 100 UNIT/ML injection Up to 90 units daily in pump   Insulin  Pen Needle (PEN NEEDLES) 32G X 5 MM MISC Please   meloxicam  (MOBIC ) 15 MG tablet Take 1 tablet (15 mg total) by mouth daily.   NON  FORMULARY CPAP @@ 9 cm H2O nightly   nystatin cream (MYCOSTATIN) Apply 1 Application topically 2 (two) times daily.   pravastatin  (PRAVACHOL ) 20 MG tablet Take 1 tablet by mouth once daily (Patient taking differently: Take 20 mg by mouth 3 (three) times a week.)   Semaglutide  (OZEMPIC , 1 MG/DOSE, [place]) Inject into the skin.   triamcinolone  cream (KENALOG ) 0.5 % Apply 1 Application topically 3 (three) times daily. To rash on neck       08/16/2023    9:20 AM 03/22/2023    8:15 AM 02/19/2023    2:08 PM 01/02/2023    2:51 PM  GAD 7 : Generalized Anxiety Score  Nervous, Anxious, on Edge 0 0 0 0  Control/stop worrying 1 1 0 1  Worry too much - different things 1 1 1  0  Trouble relaxing 1 1 0 0  Restless 0 0 0 0  Easily annoyed or irritable 0 0 0 0  Afraid - awful might happen 0 0 0 0  Total GAD 7 Score 3 3 1 1   Anxiety Difficulty Not difficult at all Not difficult at all Not difficult at all Not difficult at all       08/16/2023    9:19 AM 03/22/2023    8:15 AM 02/19/2023    2:08 PM  Depression screen PHQ 2/9  Decreased Interest 0 0 0  Down, Depressed, Hopeless 0 0 0  PHQ - 2 Score 0 0 0  Altered sleeping 0 0 0  Tired, decreased energy 1 1 1   Change in appetite 0 1 0  Feeling bad or failure about yourself  0 1 0  Trouble concentrating 0 0 1  Moving slowly or fidgety/restless 0 0 0  Suicidal thoughts 0 0 0  PHQ-9 Score 1 3 2   Difficult doing work/chores Not difficult at all Not difficult at all Not difficult at all    BP Readings from Last 3 Encounters:  08/28/23 132/70  08/16/23 124/66  07/18/23 114/70    Physical Exam Vitals and nursing note reviewed.  Constitutional:      General: She is not in acute distress.    Appearance: Normal appearance. She is well-developed. She is not ill-appearing.  HENT:     Head: Normocephalic and atraumatic.  Cardiovascular:     Rate and Rhythm: Normal rate and regular rhythm.     Heart sounds: No murmur heard. Pulmonary:     Effort:  Pulmonary effort is normal. No respiratory distress.  Abdominal:     General: Abdomen is flat. There is no distension.     Palpations: Abdomen is soft. There is no mass.     Tenderness: There is no abdominal tenderness.  Skin:    General: Skin is warm and dry.     Findings: No rash.  Neurological:     Mental Status: She is alert and oriented to person, place, and time.  Psychiatric:        Mood and Affect: Mood normal.        Behavior: Behavior normal.     Wt Readings from Last 3 Encounters:  08/28/23 152 lb 2 oz (69 kg)  08/16/23 153 lb 4 oz (69.5 kg)  07/18/23 153 lb 4 oz (69.5 kg)    BP 132/70   Pulse 79   Temp (!) 97.5 F (36.4 C)   Ht 5\' 2"  (1.575 m)   Wt 152 lb 2 oz (69 kg)   SpO2 98%   BMI 27.82 kg/m   Assessment and Plan:  Problem List Items Addressed This Visit   None Visit Diagnoses       Diarrhea of presumed infectious origin    -  Primary   likely viral enteritis from family gathering no red flag or worrisome symptoms or findings on exam Push fluids, use imodium tid prn.       No follow-ups on file.    Sheron Dixons, MD [facility] [place]

## 2023-08-28 NOTE — Patient Instructions (Signed)
 Imodium 2 mg - take up to three times per day.

## 2023-08-30 ENCOUNTER — Ambulatory Visit

## 2023-08-30 VITALS — Ht 62.0 in | Wt 154.0 lb

## 2023-08-30 DIAGNOSIS — F439 Reaction to severe stress, unspecified: Secondary | ICD-10-CM | POA: Diagnosis not present

## 2023-08-30 DIAGNOSIS — Z0000 Encounter for general adult medical examination without abnormal findings: Secondary | ICD-10-CM | POA: Diagnosis not present

## 2023-08-30 DIAGNOSIS — E119 Type 2 diabetes mellitus without complications: Secondary | ICD-10-CM

## 2023-08-30 DIAGNOSIS — Z794 Long term (current) use of insulin: Secondary | ICD-10-CM

## 2023-08-30 NOTE — Progress Notes (Signed)
 Subjective:   Melinda Ford is a 78 y.o. who presents for a Medicare Wellness preventive visit.  As a reminder, Annual Wellness Visits don't include a physical exam, and some assessments may be limited, especially if this visit is performed virtually. We may recommend an in-person follow-up visit with your provider if needed.  Visit Complete: Virtual I connected with  Micael Adas on 08/30/23 by a audio enabled telemedicine application and verified that I am speaking with the correct person using two identifiers.  Patient Location: Home  Provider Location: Home Office  I discussed the limitations of evaluation and management by telemedicine. The patient expressed understanding and agreed to proceed.  Vital Signs: Because this visit was a virtual/telehealth visit, some criteria may be missing or patient reported. Any vitals not documented were not able to be obtained and vitals that have been documented are patient reported.  VideoDeclined- This patient declined Librarian, academic. Therefore the visit was completed with audio only.  Persons Participating in Visit: Patient.  AWV Questionnaire: No: Patient Medicare AWV questionnaire was not completed prior to this visit.  Cardiac Risk Factors include: advanced age (>45men, >89 women);hypertension;diabetes mellitus;dyslipidemia;Other (see comment), Risk factor comments: OSA (no cpap)     Objective:     Today's Vitals   08/30/23 1351  Weight: 154 lb (69.9 kg)  Height: 5\' 2"  (1.575 m)   Body mass index is 28.17 kg/m.     08/30/2023    2:16 PM 02/22/2023    9:43 AM 01/16/2023   11:39 AM 08/17/2022   10:58 AM 03/12/2022   12:28 PM 08/15/2021   12:02 PM 08/02/2021    9:47 AM  Advanced Directives  Does Patient Have a Medical Advance Directive? Yes Yes Yes No No Yes No  Type of Estate agent of [place];Living will Healthcare Power of [place];Living will    Healthcare Power of  [place];Living will   Does patient want to make changes to medical advance directive? No - Patient declined  No - Patient declined      Copy of Healthcare Power of Attorney in Chart? No - copy requested     No - copy requested   Would patient like information on creating a medical advance directive?    No - Patient declined   No - Patient declined    Current Medications (verified) Outpatient Encounter Medications as of 08/30/2023  Medication Sig   acetaminophen  (TYLENOL ) 500 MG tablet Take 1,000 mg by mouth 2 (two) times daily. As needed   amLODipine  (NORVASC ) 10 MG tablet Take 1 tablet (10 mg total) by mouth 2 (two) times daily.   Apoaequorin (PREVAGEN PO) Take by mouth.   Calcium Carbonate-Vit D-Min (CALCIUM 1200 PO) Take 2 each by mouth daily.   Cholecalciferol (VITAMIN D ) 2000 UNITS CAPS Take 1 capsule (2,000 Units total) by mouth daily.   clotrimazole -betamethasone  (LOTRISONE ) cream Apply 1 Application topically daily. To feet and rash under breasts   Continuous Glucose Receiver (FREESTYLE LIBRE 3 READER) DEVI Use to monitor glucose levels   Continuous Glucose Sensor (FREESTYLE LIBRE 3 PLUS SENSOR) MISC 1 each by Does not apply route continuous. Change every 15 days.   insulin  lispro (HUMALOG ) 100 UNIT/ML injection Up to 90 units daily in pump   Insulin  Pen Needle (PEN NEEDLES) 32G X 5 MM MISC Please   meloxicam  (MOBIC ) 15 MG tablet Take 1 tablet (15 mg total) by mouth daily.   pravastatin  (PRAVACHOL ) 20 MG tablet Take 1 tablet  by mouth once daily (Patient taking differently: Take 20 mg by mouth 3 (three) times a week.)   Semaglutide  (OZEMPIC , 1 MG/DOSE, [place]) Inject into the skin.   triamcinolone  cream (KENALOG ) 0.5 % Apply 1 Application topically 3 (three) times daily. To rash on neck   Continuous Glucose Sensor (FREESTYLE LIBRE 2 SENSOR) MISC Place 1 sensor on the skin every 14 days. Use to check glucose continuously (Patient not taking: Reported on 08/30/2023)   Continuous Glucose  Sensor (FREESTYLE LIBRE 3 SENSOR) MISC Place 1 sensor on the skin every 14 days. Use to check glucose continuously (Patient not taking: Reported on 08/30/2023)   glipiZIDE (GLUCOTROL) 5 MG tablet Take 10 mg by mouth daily before breakfast. (Patient not taking: Reported on 08/30/2023)   Glucagon  (GVOKE HYPOPEN  1-PACK) 1 MG/0.2ML SOAJ Inject 1 mg into the skin as needed (low blood sugar with impaired consciousness). (Patient not taking: Reported on 08/30/2023)   glucose blood (ONETOUCH VERIO) test strip USE 1 STRIP TO CHECK GLUCOSE 4 TIMES DAILY (Patient not taking: Reported on 08/30/2023)   NON FORMULARY CPAP @@ 9 cm H2O nightly (Patient not taking: Reported on 08/30/2023)   nystatin cream (MYCOSTATIN) Apply 1 Application topically 2 (two) times daily. (Patient not taking: Reported on 08/30/2023)   No facility-administered encounter medications on file as of 08/30/2023.    Allergies (verified) Ace inhibitors, Wheat, and Gramineae pollens   History: Past Medical History:  Diagnosis Date   Allergy    Arthritis    Cataract    Diabetes mellitus without complication (HCC)    Hyperlipidemia    Hypertension    Motion sickness    planes   OSA (obstructive sleep apnea) 08/23/2016   CPAP   Osteoporosis    Presence of dental prosthetic device    implants   Radiculopathy of lumbosacral region 06/22/2016   Right lateral epicondylitis 06/04/2017   Right sided sciatica 03/09/2016   Sleep apnea    Past Surgical History:  Procedure Laterality Date   ABDOMINAL HYSTERECTOMY     CATARACT EXTRACTION Right 2020   COLONOSCOPY WITH PROPOFOL  N/A 10/10/2019   Procedure: COLONOSCOPY WITH PROPOFOL ;  Surgeon: Marnee Sink, MD;  Location: [facility];  Service: Endoscopy;  Laterality: N/A;   ESOPHAGOGASTRODUODENOSCOPY (EGD) WITH PROPOFOL  N/A 10/10/2019   Procedure: ESOPHAGOGASTRODUODENOSCOPY (EGD) WITH PROPOFOL ;  Surgeon: Marnee Sink, MD;  Location: [facility];  Service: Endoscopy;  Laterality: N/A;   Diabetic - insulin  pump and oral meds sleep apnea   POLYPECTOMY  10/10/2019   Procedure: POLYPECTOMY;  Surgeon: Marnee Sink, MD;  Location: [facility];  Service: Endoscopy;;   Family History  Problem Relation Age of Onset   Breast cancer Other 40   Alcohol abuse Father    Mental illness Father    Stroke Father    Diabetes Sister    Asthma Brother    Diabetes Brother    Diabetes Maternal Aunt    Heart disease Maternal Aunt    Diabetes Maternal Uncle    Early death Maternal Uncle    Breast cancer Cousin 47       mat cousin   Social History   Socioeconomic History   Marital status: Married    Spouse name: Analysia Dungee   Number of children: 1   Years of education: 20   Highest education level: Doctorate  Occupational History   Not on file  Tobacco Use   Smoking status: Never    Passive exposure: Past   Smokeless tobacco: Never  Vaping Use   Vaping status: Never Used  Substance and Sexual Activity   Alcohol use: Not Currently    Alcohol/week: 1.0 standard drink of alcohol    Types: 1 Standard drinks or equivalent per week   Drug use: Never   Sexual activity: Not Currently    Partners: Male  Other Topics Concern   Not on file  Social History Narrative   Not on file   Social Drivers of Health   Financial Resource Strain: Low Risk  (08/30/2023)   Overall Financial Resource Strain (CARDIA)    Difficulty of Paying Living Expenses: Not hard at all  Food Insecurity: No Food Insecurity (08/30/2023)   Hunger Vital Sign    Worried About Running Out of Food in the Last Year: Never true    Ran Out of Food in the Last Year: Never true  Transportation Needs: No Transportation Needs (08/30/2023)   PRAPARE - Administrator, Civil Service (Medical): No    Lack of Transportation (Non-Medical): No  Physical Activity: Insufficiently Active (08/30/2023)   Exercise Vital Sign    Days of Exercise per Week: 2 days    Minutes of Exercise per Session: 40 min   Stress: Stress Concern Present (08/30/2023)   [employer] of Occupational Health - Occupational Stress Questionnaire    Feeling of Stress : Rather much  Social Connections: Socially Integrated (08/30/2023)   Social Connection and Isolation Panel [NHANES]    Frequency of Communication with Friends and Family: More than three times a week    Frequency of Social Gatherings with Friends and Family: Three times a week    Attends Religious Services: More than 4 times per year    Active Member of Clubs or Organizations: Yes    Attends Engineer, structural: More than 4 times per year    Marital Status: Married    Tobacco Counseling Counseling given: Not Answered    Clinical Intake:  Pre-visit preparation completed: Yes  Pain : No/denies pain     BMI - recorded: 28.17 Nutritional Status: BMI 25 -29 Overweight Nutritional Risks: Nausea/ vomitting/ diarrhea (this week) Diabetes: Yes CBG done?: No Did pt. bring in CBG monitor from home?: No  Lab Results  Component Value Date   HGBA1C 7.4 (A) 02/13/2023   HGBA1C 7.6 11/02/2022   HGBA1C 7.6 (A) 08/29/2022     How often do you need to have someone help you when you read instructions, pamphlets, or other written materials from your doctor or pharmacy?: 1 - Never  Interpreter Needed?: No  Information entered by :: Jaunita Messier, CMA   Activities of Daily Living     08/30/2023    1:56 PM 01/02/2023    2:51 PM  In your present state of health, do you have any difficulty performing the following activities:  Hearing? 0 0  Vision? 0 0  Difficulty concentrating or making decisions? 0 0  Walking or climbing stairs? 1 1  Comment due to hip pain   Dressing or bathing? 0 0  Doing errands, shopping? 0 0  Preparing Food and eating ? N   Using the Toilet? N   In the past six months, have you accidently leaked urine? N   Do you have problems with loss of bowel control? N   Managing your Medications? N   Managing your  Finances? N   Housekeeping or managing your Housekeeping? N     Patient Care Team: Sheron Dixons, MD as PCP - General (  Internal Medicine) Anell Baptist, DPM as Referring Physician (Podiatry) Mellody Sprout, MD (Otolaryngology) Antonette Batters, MD as Consulting Physician (Cardiology) [facility] (Ophthalmology) Th[place], [place], MD as Consulting Physician (Endocrinology) Cephus Collin, MD as Consulting Physician (Pain Medicine)  Indicate any recent Medical Services you may have received from other than [facility] providers in the past year (date may be approximate).     Assessment:    This is a routine wellness examination for [place].  Hearing/Vision screen Hearing Screening - Comments:: Denies hearing loss Vision Screening - Comments:: Gets DM eye exams, Dr. Shann Darnel, [place]   Goals Addressed             This Visit's Progress    Patient Stated       Do more social activities       Depression Screen     08/30/2023    2:13 PM 08/16/2023    9:19 AM 03/22/2023    8:15 AM 02/19/2023    2:08 PM 01/16/2023   11:39 AM 01/02/2023    2:51 PM 11/29/2022    8:47 AM  PHQ 2/9 Scores  PHQ - 2 Score 0 0 0 0 0 0 0  PHQ- 9 Score 3 1 3 2  4 3     Fall Risk     08/30/2023    2:17 PM 08/16/2023    9:19 AM 07/18/2023    2:47 PM 03/22/2023    8:15 AM 02/22/2023    9:39 AM  Fall Risk   Falls in the past year? 1 0 0 0 1  Number falls in past yr: 1 0 0 0 0  Comment     fell in the bathroom, husband says she fainted  Injury with Fall? 0 0 0 0 1  Comment     called 911, she did have a righ side facial fx.  Risk for fall due to : History of fall(s);Orthopedic patient;Impaired balance/gait No Fall Risks No Fall Risks No Fall Risks History of fall(s)  Follow up Falls evaluation completed;Education provided Falls evaluation completed Falls evaluation completed Falls evaluation completed Falls evaluation completed;Education provided    MEDICARE RISK AT HOME:  Medicare Risk at  Home Any stairs in or around the home?: Yes If so, are there any without handrails?: No Home free of loose throw rugs in walkways, pet beds, electrical cords, etc?: Yes Adequate lighting in your home to reduce risk of falls?: Yes Life alert?: No Use of a cane, walker or w/c?: No Grab bars in the bathroom?: Yes Shower chair or bench in shower?: No Elevated toilet seat or a handicapped toilet?: Yes  TIMED UP AND GO:  Was the test performed?  No  Cognitive Function: 6CIT completed    07/26/2015    5:00 PM  MMSE - Mini Mental State Exam  Orientation to time 5  Orientation to Place 4  Registration 3  Attention/ Calculation 5  Recall 3  Language- name 2 objects 2  Language- repeat 1  Language- follow 3 step command 3  Language- read & follow direction 1  Write a sentence 1  Copy design 1  Total score 29        08/30/2023    2:20 PM 08/17/2022   11:04 AM 08/11/2020   11:42 AM 09/12/2019    2:40 PM 05/15/2018    9:39 AM  6CIT Screen  What Year? 0 points 0 points 0 points 0 points 0 points  What month? 0 points 0 points 0 points  0 points 0 points  What time? 0 points 0 points 0 points 0 points 0 points  Count back from 20 0 points 2 points 0 points 0 points 0 points  Months in reverse 0 points 0 points 0 points 0 points 0 points  Repeat phrase 2 points 0 points 0 points 2 points 0 points  Total Score 2 points 2 points 0 points 2 points 0 points    Immunizations Immunization History  Administered Date(s) Administered   Fluad Quad(high Dose 65+) 12/09/2019, 12/28/2020   Fluad Trivalent(High Dose 65+) 01/02/2023   Influenza, High Dose Seasonal PF 01/25/2016, 01/22/2017, 01/18/2018   Influenza,inj,Quad PF,6+ Mos 02/24/2014, 01/15/2015   Influenza-Unspecified 01/09/2011, 12/15/2011   Moderna Sars-Covid-2 Vaccination 05/09/2019, 06/06/2019, 02/10/2020, 03/25/2021   PNEUMOCOCCAL CONJUGATE-20 03/22/2023   Pneumococcal Conjugate-13 04/16/2014   Pneumococcal Polysaccharide-23  05/06/2010   Respiratory Syncytial Virus Vaccine,Recomb Aduvanted(Arexvy) 09/20/2022   Tdap 01/22/2017   Zoster Recombinant(Shingrix) 01/18/2018, 11/12/2018   Zoster, Live 10/25/2010    Screening Tests Health Maintenance  Topic Date Due   HEMOGLOBIN A1C  08/13/2023   COVID-19 Vaccine (5 - 2024-25 season) 09/01/2023 (Originally 12/03/2022)   OPHTHALMOLOGY EXAM  09/05/2023   MAMMOGRAM  10/16/2023   INFLUENZA VACCINE  11/02/2023   Diabetic kidney evaluation - eGFR measurement  07/17/2024   Diabetic kidney evaluation - Urine ACR  07/17/2024   FOOT EXAM  07/17/2024   Medicare Annual Wellness (AWV)  08/29/2024   DEXA SCAN  10/15/2025   DTaP/Tdap/Td (2 - Td or Tdap) 01/23/2027   Pneumonia Vaccine 80+ Years old  Completed   Hepatitis C Screening  Completed   Zoster Vaccines- Shingrix  Completed   HPV VACCINES  Aged Out   Meningococcal B Vaccine  Aged Out   Colonoscopy  Discontinued    Health Maintenance  Health Maintenance Due  Topic Date Due   HEMOGLOBIN A1C  08/13/2023   Health Maintenance Items Addressed: See Nurse Notes  Additional Screening:  Vision Screening: Recommended annual ophthalmology exams for early detection of glaucoma and other disorders of the eye.  Dental Screening: Recommended annual dental exams for proper oral hygiene  Community Resource Referral / Chronic Care Management: CRR required this visit?  Yes   CCM required this visit?  No   Plan:    I have personally reviewed and noted the following in the patient's chart:   Medical and social history Use of alcohol, tobacco or illicit drugs  Current medications and supplements including opioid prescriptions. Patient is not currently taking opioid prescriptions. Functional ability and status Nutritional status Physical activity Advanced directives List of other physicians Hospitalizations, surgeries, and ER visits in previous 12 months Vitals Screenings to include cognitive, depression, and  falls Referrals and appointments  In addition, I have reviewed and discussed with patient certain preventive protocols, quality metrics, and best practice recommendations. A written personalized care plan for preventive services as well as general preventive health recommendations were provided to patient.   Jaunita Messier, CMA   08/30/2023   After Visit Summary: (MyChart) Due to this being a telephonic visit, the after visit summary with patients personalized plan was offered to patient via MyChart   Notes: Please refer to Routing Comments.

## 2023-08-30 NOTE — Patient Instructions (Signed)
 [name] , Thank you for taking time out of your busy schedule to complete your Annual Wellness Visit with me. I enjoyed our conversation and look forward to speaking with you again next year. I, as well as your care team,  appreciate your ongoing commitment to your health goals. Please review the following plan we discussed and let me know if I can assist you in the future. Your Game plan/ To Do List    Referrals: I have placed a referral to diabetes and nutrition education. Someone will call you to schedule. I have placed a referral for a social worker to contact you about stress management.  Follow up Visits: Next Medicare AWV with our clinical staff: Declined to schedule at this time.   Have you seen your provider in the last 6 months (3 months if uncontrolled diabetes)? Yes Next Office Visit with your provider: 01/17/24 @ 10:00 am with Dr. Gala Jubilee  Clinician Recommendations: Call [phone] to schedule a mammogram at your convenience. Get a Covid booster in the fall.  Aim for 30 minutes of exercise or brisk walking, 6-8 glasses of water , and 5 servings of fruits and vegetables each day.       This is a list of the screening recommended for you and due dates:  Health Maintenance  Topic Date Due   Hemoglobin A1C  08/13/2023   COVID-19 Vaccine (5 - 2024-25 season) 09/01/2023*   Eye exam for diabetics  09/05/2023   Mammogram  10/16/2023   Flu Shot  11/02/2023   Yearly kidney function blood test for diabetes  07/17/2024   Yearly kidney health urinalysis for diabetes  07/17/2024   Complete foot exam   07/17/2024   Medicare Annual Wellness Visit  08/29/2024   DEXA scan (bone density measurement)  10/15/2025   DTaP/Tdap/Td vaccine (2 - Td or Tdap) 01/23/2027   Pneumonia Vaccine  Completed   Hepatitis C Screening  Completed   Zoster (Shingles) Vaccine  Completed   HPV Vaccine  Aged Out   Meningitis B Vaccine  Aged Out   Colon Cancer Screening  Discontinued  *Topic was postponed.  The date shown is not the original due date.    Advanced directives: (Copy Requested) Please bring a copy of your health care power of attorney and living will to the office to be added to your chart at your convenience. You can mail to [facility] [address] [place], [place] [address] or email to [email or web address] Advance Care Planning is important because it:  [x]  Makes sure you receive the medical care that is consistent with your values, goals, and preferences  [x]  It provides guidance to your family and loved ones and reduces their decisional burden about whether or not they are making the right decisions based on your wishes.  Follow the link provided in your after visit summary or read over the paperwork we have mailed to you to help you started getting your Advance Directives in place. If you need assistance in completing these, please reach out to us  so that we can help you!  See attachments for Preventive Care and Fall Prevention Tips.   Fall Prevention in the Home, Adult Falls can cause injuries and affect people of all ages. There are many simple things that you can do to make your home safe and to help prevent falls. If you need it, ask for help making these changes. What actions can I take to prevent falls? General information Use good lighting in  all rooms. Make sure to: Replace any light bulbs that burn out. Turn on lights if it is dark and use night-lights. Keep items that you use often in easy-to-reach places. Lower the shelves around your home if needed. Move furniture so that there are clear paths around it. Do not keep throw rugs or other things on the floor that can make you trip. If any of your floors are uneven, fix them. Add color or contrast paint or tape to clearly mark and help you see: Grab bars or handrails. First and last steps of staircases. Where the edge of each step is. If you use a ladder or stepladder: Make sure that  it is fully opened. Do not climb a closed ladder. Make sure the sides of the ladder are locked in place. Have someone hold the ladder while you use it. Know where your pets are as you move through your home. What can I do in the bathroom?     Keep the floor dry. Clean up any water  that is on the floor right away. Remove soap buildup in the bathtub or shower. Buildup makes bathtubs and showers slippery. Use non-skid mats or decals on the floor of the bathtub or shower. Attach bath mats securely with double-sided, non-slip rug tape. If you need to sit down while you are in the shower, use a non-slip stool. Install grab bars by the toilet and in the bathtub and shower. Do not use towel bars as grab bars. What can I do in the bedroom? Make sure that you have a light by your bed that is easy to reach. Do not use any sheets or blankets on your bed that hang to the floor. Have a firm bench or chair with side arms that you can use for support when you get dressed. What can I do in the kitchen? Clean up any spills right away. If you need to reach something above you, use a sturdy step stool that has a grab bar. Keep electrical cables out of the way. Do not use floor polish or wax that makes floors slippery. What can I do with my stairs? Do not leave anything on the stairs. Make sure that you have a light switch at the top and the bottom of the stairs. Have them installed if you do not have them. Make sure that there are handrails on both sides of the stairs. Fix handrails that are broken or loose. Make sure that handrails are as long as the staircases. Install non-slip stair treads on all stairs in your home if they do not have carpet. Avoid having throw rugs at the top or bottom of stairs, or secure the rugs with carpet tape to prevent them from moving. Choose a carpet design that does not hide the edge of steps on the stairs. Make sure that carpet is firmly attached to the stairs. Fix any  carpet that is loose or worn. What can I do on the outside of my home? Use bright outdoor lighting. Repair the edges of walkways and driveways and fix any cracks. Clear paths of anything that can make you trip, such as tools or rocks. Add color or contrast paint or tape to clearly mark and help you see high doorway thresholds. Trim any bushes or trees on the main path into your home. Check that handrails are securely fastened and in good repair. Both sides of all steps should have handrails. Install guardrails along the edges of any raised decks or porches. Have  leaves, snow, and ice cleared regularly. Use sand, salt, or ice melt on walkways during winter months if you live where there is ice and snow. In the garage, clean up any spills right away, including grease or oil spills. What other actions can I take? Review your medicines with your health care provider. Some medicines can make you confused or feel dizzy. This can increase your chance of falling. Wear closed-toe shoes that fit well and support your feet. Wear shoes that have rubber soles and low heels. Use a cane, walker, scooter, or crutches that help you move around if needed. Talk with your provider about other ways that you can decrease your risk of falls. This may include seeing a physical therapist to learn to do exercises to improve movement and strength. Where to find more information Centers for Disease Control and Prevention, STEADI: TonerPromos.no [employer] on Aging: BaseRingTones.pl National Institute on Aging: BaseRingTones.pl Contact a health care provider if: You are afraid of falling at home. You feel weak, drowsy, or dizzy at home. You fall at home. Get help right away if you: Lose consciousness or have trouble moving after a fall. Have a fall that causes a head injury. These symptoms may be an emergency. Get help right away. Call 911. Do not wait to see if the symptoms will go away. Do not drive yourself to the  hospital. This information is not intended to replace advice given to you by your health care provider. Make sure you discuss any questions you have with your health care provider. Document Revised: 11/21/2021 Document Reviewed: 11/21/2021 Elsevier Patient Education  2024 [employer].

## 2023-08-31 ENCOUNTER — Telehealth: Payer: Self-pay

## 2023-08-31 NOTE — Progress Notes (Unsigned)
 Complex Care Management Note Care Guide Note  08/31/2023 Name: Melinda Ford MRN: [id number] DOB: [birth year]   Complex Care Management Outreach Attempts: An unsuccessful telephone outreach was attempted today to offer the patient information about available complex care management services.  Follow Up Plan:  Additional outreach attempts will be made to offer the patient complex care management information and services.   Encounter Outcome:  No Answer  Creola Doheny [facility], [facility] Guide  Direct Dial: [phone]  Fax [phone]

## 2023-09-04 ENCOUNTER — Ambulatory Visit: Payer: Self-pay

## 2023-09-04 ENCOUNTER — Ambulatory Visit
Admission: RE | Admit: 2023-09-04 | Discharge: 2023-09-04 | Disposition: A | Discharge status: Home / Self Care | Attending: Family Medicine | Admitting: Family Medicine

## 2023-09-04 ENCOUNTER — Ambulatory Visit
Admission: RE | Admit: 2023-09-04 | Discharge: 2023-09-04 | Disposition: A | Discharge status: Home / Self Care | Source: Ambulatory Visit | Attending: Family Medicine

## 2023-09-04 ENCOUNTER — Ambulatory Visit (INDEPENDENT_AMBULATORY_CARE_PROVIDER_SITE_OTHER): Admitting: Family Medicine

## 2023-09-04 ENCOUNTER — Encounter: Payer: Self-pay | Admitting: Family Medicine

## 2023-09-04 VITALS — BP 122/72 | HR 54 | Ht 62.0 in | Wt 152.0 lb

## 2023-09-04 DIAGNOSIS — M778 Other enthesopathies, not elsewhere classified: Secondary | ICD-10-CM | POA: Insufficient documentation

## 2023-09-04 DIAGNOSIS — S59902A Unspecified injury of left elbow, initial encounter: Secondary | ICD-10-CM | POA: Diagnosis not present

## 2023-09-04 DIAGNOSIS — M25532 Pain in left wrist: Secondary | ICD-10-CM

## 2023-09-04 DIAGNOSIS — S63502A Unspecified sprain of left wrist, initial encounter: Secondary | ICD-10-CM | POA: Diagnosis not present

## 2023-09-04 DIAGNOSIS — S6992XA Unspecified injury of left wrist, hand and finger(s), initial encounter: Secondary | ICD-10-CM | POA: Diagnosis not present

## 2023-09-04 NOTE — Progress Notes (Signed)
 Primary Care / Sports Medicine Office Visit  Patient Information:  Patient ID: MIKAIA JANVIER, female DOB: 1945/09/22 Age: 78 y.o. MRN: [id number]   FRAYA UEDA is a pleasant 78 y.o. female presenting with the following:  Chief Complaint  Patient presents with   Fall    Patient had a fall today. She has a bruise/ scrape on her nose and upper lip. Patient hurt her left wrist and left knee. She has swelling at the thumb and wrist of left hand.     Vitals:   09/04/23 1512  BP: 122/72  Pulse: (!) 54  SpO2: 98%   Vitals:   09/04/23 1512  Weight: 152 lb (68.9 kg)  Height: 5\' 2"  (1.575 m)   Body mass index is 27.8 kg/m.  No results found.   Independent interpretation of notes and tests performed by another provider:   See below  Procedures performed:   None  Pertinent History, Exam, Impression, and Recommendations:   Problem List Items Addressed This Visit     Tendinitis of left wrist - Primary   History of Present Illness Mckynlie V Coyle is a 78 year old female who presents with a fall resulting in wrist and facial injuries.  Earlier today, she fell on uneven pavement, resulting in injuries to her left wrist and face. Her left wrist is the most painful, with swelling present. Pain is primarily in the wrist, especially with movement, but there is no pain in the elbow during flexion or extension. She also sustained a laceration inside her upper lip, which bled initially but is not actively bleeding now.  Additionally, she has a scrape on her left knee with some swelling, but no other significant injuries were reported. She has Voltaren  gel available for pain management.  No pain in the elbow during flexion or extension, and no other significant injuries besides the wrist, face, and knee.  Physical Exam INSPECTION: Trace swelling at the radial aspect of the left wrist. No ecchymosis. PALPATION: Minimally tender at the left radial head. Mild tenderness at the radial  styloid dorsal aspect of the left wrist. RANGE OF MOTION: Limited range of motion at the left wrist with extension and radial deviation. Full range of motion at the left elbow. HEENT: Nose without gross malalignment. Small laceration without active bleeding in the oropharynx, upper lip. Dentition normal. 1 cm laceration inside upper lip. Maxillary and frontal sinuses nontender.  Results RADIOLOGY Left Wrist X-ray (09/04/2023): No fractures, mild tissue shadow swelling. Left Elbow X-ray (09/04/2023): No fractures, radial head intact.  Assessment and Plan Left wrist sprain Acute left wrist sprain with mild tenderness at radial styloid. No fracture on x-ray. Swelling from sprained ligaments and acutely stretched tendons. - Apply wrist brace for immobilization and support. - Advise ice application for 20 minutes frequently to reduce swelling. - Recommend Voltaren  gel on wrist up to four times daily. - Encourage gentle hand use to maintain tendon movement. - Advise wearing brace until weekend, with option to remove for family reunion if symptoms improve. - Instruct to message if symptoms persist beyond two weeks for further evaluation.  Knee abrasion Superficial abrasion on left knee with swelling. No deeper injury. - Recommend Voltaren  gel for pain relief. - Advise keeping area clean to prevent infection.  Laceration of upper lip Laceration of upper lip with no active bleeding. Risk of infection if not kept clean. - Advise rinsing mouth thoroughly after eating to prevent infection. - Recommend Tylenol  for pain control. -  Suggest applying ice directly to lip to reduce swelling.        Orders & Medications Medications: No orders of the defined types were placed in this encounter.  Orders Placed This Encounter  Procedures   DG Wrist Complete Left   DG Elbow 2 Views Left     No follow-ups on file.     Ma Saupe, MD, [facility]   Primary Care Sports Medicine Primary Care and  Sports Medicine at [facility]

## 2023-09-04 NOTE — Assessment & Plan Note (Signed)
 History of Present Illness Melinda Ford is a 78 year old female who presents with a fall resulting in wrist and facial injuries.  Earlier today, she fell on uneven pavement, resulting in injuries to her left wrist and face. Her left wrist is the most painful, with swelling present. Pain is primarily in the wrist, especially with movement, but there is no pain in the elbow during flexion or extension. She also sustained a laceration inside her upper lip, which bled initially but is not actively bleeding now.  Additionally, she has a scrape on her left knee with some swelling, but no other significant injuries were reported. She has Voltaren  gel available for pain management.  No pain in the elbow during flexion or extension, and no other significant injuries besides the wrist, face, and knee.  Physical Exam INSPECTION: Trace swelling at the radial aspect of the left wrist. No ecchymosis. PALPATION: Minimally tender at the left radial head. Mild tenderness at the radial styloid dorsal aspect of the left wrist. RANGE OF MOTION: Limited range of motion at the left wrist with extension and radial deviation. Full range of motion at the left elbow. HEENT: Nose without gross malalignment. Small laceration without active bleeding in the oropharynx, upper lip. Dentition normal. 1 cm laceration inside upper lip. Maxillary and frontal sinuses nontender.  Results RADIOLOGY Left Wrist X-ray (09/04/2023): No fractures, mild tissue shadow swelling. Left Elbow X-ray (09/04/2023): No fractures, radial head intact.  Assessment and Plan Left wrist sprain Acute left wrist sprain with mild tenderness at radial styloid. No fracture on x-ray. Swelling from sprained ligaments and acutely stretched tendons. - Apply wrist brace for immobilization and support. - Advise ice application for 20 minutes frequently to reduce swelling. - Recommend Voltaren  gel on wrist up to four times daily. - Encourage gentle hand use  to maintain tendon movement. - Advise wearing brace until weekend, with option to remove for family reunion if symptoms improve. - Instruct to message if symptoms persist beyond two weeks for further evaluation.  Knee abrasion Superficial abrasion on left knee with swelling. No deeper injury. - Recommend Voltaren  gel for pain relief. - Advise keeping area clean to prevent infection.  Laceration of upper lip Laceration of upper lip with no active bleeding. Risk of infection if not kept clean. - Advise rinsing mouth thoroughly after eating to prevent infection. - Recommend Tylenol  for pain control. - Suggest applying ice directly to lip to reduce swelling.

## 2023-09-04 NOTE — Telephone Encounter (Signed)
 Please review, patient is schedule for tomorrow.

## 2023-09-04 NOTE — Telephone Encounter (Signed)
 Copied from CRM [id number]. Topic: Clinical - Red Word Triage >> Sep 04, 2023  2:05 PM Oddis Bench wrote: Red Word that prompted transfer to Nurse Triage: Patient is calling about  falling hit face, swollen lips and left wrist is sore and swelling also scratched up her knee. Georgean Kindle on lin  Chief Complaint: falls Symptoms: tripped & fell: hit face: swelling lips & face, hit left wrist: severe pain,  Frequency: today Pertinent Negatives: Patient denies chest pain, numbness or tingling Disposition: [] ED /[] Urgent Care (no appt availability in office) / [x] Appointment(In office/virtual)/ []  [place] [facility]/ [] Home Care/ [] Refused Recommended Disposition /[] [place] [facility]/ []  Follow-up with PCP Additional Notes: in office appt made  Reason for Disposition  [1] No prior tetanus shots (or is not fully vaccinated) AND [2] any wound (e.g., cut or scrape)  Answer Assessment - Initial Assessment Questions 1. MECHANISM: "How did the fall happen?"     fall 2. DOMESTIC VIOLENCE AND ELDER ABUSE SCREENING: "Did you fall because someone pushed you or tried to hurt you?" If Yes, ask: "Are you safe now?"     no 3. ONSET: "When did the fall happen?" (e.g., minutes, hours, or days ago)     today 4. LOCATION: "What part of the body hit the ground?" (e.g., back, buttocks, head, hips, knees, hands, head, stomach)     Face: swelling lips, left wrist  5. INJURY: "Did you hurt (injure) yourself when you fell?" If Yes, ask: "What did you injure? Tell me more about this?" (e.g., body area; type of injury; pain severity)"     Scratches and swelling to face and severe pain in left wrist 6. PAIN: "Is there any pain?" If Yes, ask: "How bad is the pain?" (e.g., Scale 1-10; or mild,  moderate, severe)   - NONE (0): No pain   - MILD (1-3): Doesn't interfere with normal activities    - MODERATE (4-7): Interferes with normal activities or awakens from sleep    - SEVERE (8-10): Excruciating pain, unable  to do any normal activities      severe 7. SIZE: For cuts, bruises, or swelling, ask: "How large is it?" (e.g., inches or centimeters)      unknown 8. PREGNANCY: "Is there any chance you are pregnant?" "When was your last menstrual period?"     N/a 9. OTHER SYMPTOMS: "Do you have any other symptoms?" (e.g., dizziness, fever, weakness; new onset or worsening).      N/a 10. CAUSE: "What do you think caused the fall (or falling)?" (e.g., tripped, dizzy spell)       tripped  Protocols used: Falls and [facility]

## 2023-09-04 NOTE — Patient Instructions (Signed)
 Patient Action Plan  1. Left Wrist Sprain:    - Wear a wrist brace for support and immobilization.    - Apply ice to the wrist for 20 minutes at a time, frequently, to reduce swelling.    - Use Voltaren  gel on the wrist up to four times daily for pain relief.    - Use your hand gently to maintain movement in the tendons.    - Continue wearing the brace until the weekend, but you may remove it for a family reunion if symptoms improve.    - Contact us  if symptoms persist beyond two weeks for further evaluation.  2. Knee Abrasion:    - Apply Voltaren  gel to the knee for pain relief.    - Keep the area clean to prevent infection.  3. Laceration of Upper Lip:    - Rinse your mouth thoroughly after eating to prevent infection.    - Take Tylenol  for pain control as needed.    - Apply ice directly to your lip to reduce swelling.  Red Flags: If you notice increased pain, swelling, redness, or signs of infection (such as warmth or pus) in any affected area, contact your healthcare provider promptly.

## 2023-09-10 ENCOUNTER — Ambulatory Visit: Payer: Self-pay | Admitting: Family Medicine

## 2023-09-17 DIAGNOSIS — M79674 Pain in right toe(s): Secondary | ICD-10-CM | POA: Diagnosis not present

## 2023-09-17 DIAGNOSIS — M79675 Pain in left toe(s): Secondary | ICD-10-CM | POA: Diagnosis not present

## 2023-09-17 DIAGNOSIS — B351 Tinea unguium: Secondary | ICD-10-CM | POA: Diagnosis not present

## 2023-09-21 ENCOUNTER — Other Ambulatory Visit: Payer: Self-pay | Admitting: *Deleted

## 2023-09-21 DIAGNOSIS — E1159 Type 2 diabetes mellitus with other circulatory complications: Secondary | ICD-10-CM | POA: Diagnosis not present

## 2023-09-21 DIAGNOSIS — Z794 Long term (current) use of insulin: Secondary | ICD-10-CM | POA: Diagnosis not present

## 2023-09-21 DIAGNOSIS — E785 Hyperlipidemia, unspecified: Secondary | ICD-10-CM | POA: Diagnosis not present

## 2023-09-21 DIAGNOSIS — I152 Hypertension secondary to endocrine disorders: Secondary | ICD-10-CM | POA: Diagnosis not present

## 2023-09-21 DIAGNOSIS — E11649 Type 2 diabetes mellitus with hypoglycemia without coma: Secondary | ICD-10-CM | POA: Diagnosis not present

## 2023-09-21 DIAGNOSIS — E1169 Type 2 diabetes mellitus with other specified complication: Secondary | ICD-10-CM | POA: Diagnosis not present

## 2023-09-21 NOTE — Patient Instructions (Signed)
 Visit Information  Thank you for taking time to visit with me today. Please don't hesitate to contact me if I can be of assistance to you before our next scheduled appointment.  Please call the care guide team at [phone] if you need to cancel or reschedule your appointment.   Following is a copy of your care plan:   Goals Addressed             This Visit's Progress    COMPLETED: VBCI Social Work Care Plan-LCSW       Problems:   Disease Management support and education needs related to Stress at    CSW Clinical Goal(s):    Interventions:  Mental Health:  Evaluation of current treatment plan related to Stress at Stress related to life circumstances and demands on her time Active listening / Reflection utilized Behavioral Activation reviewed Emotional Support Provided Motivational Interviewing employed PHQ2/PHQ9 completed Solution-Focued Strategies employed: focusing on self care encouraged, time management discussed including reducing overall responsibilities, prioritizing focus items  Patient Goals/Self-Care Activities:  Patient to continue to take medications as prescribed              Patient to continue to prioritize self care             Patient to discuss referral to Neurology Plan:   No further follow up required: Patient to contact this social worker with any additional community resource needs.        Please call 911 if you are experiencing a Mental Health or Behavioral Health Crisis or need someone to talk to.  Patient verbalizes understanding of instructions and care plan provided today and agrees to view in MyChart. Active MyChart status and patient understanding of how to access instructions and care plan via MyChart confirmed with patient.     Auda Finfrock, LCSW [place]  [facility], [facility] Licensed Clinical Social Worker  Direct Dial: [phone]

## 2023-09-21 NOTE — Patient Outreach (Addendum)
 Complex Care Management   Visit Note  09/21/2023  Name:  Melinda Ford MRN: [id number] DOB: 04-04-1945  Situation: Referral received for Complex Care Management related to SDOH Barriers:  Stress  I obtained verbal consent from Patient.  Visit completed with patient  on the phone  Background:   Past Medical History:  Diagnosis Date   Allergy    Arthritis    Cataract    Diabetes mellitus without complication (HCC)    Hyperlipidemia    Hypertension    Motion sickness    planes   OSA (obstructive sleep apnea) 08/23/2016   CPAP   Osteoporosis    Presence of dental prosthetic device    implants   Radiculopathy of lumbosacral region 06/22/2016   Right lateral epicondylitis 06/04/2017   Right sided sciatica 03/09/2016   Sleep apnea     Assessment: Patient Reported Symptoms:  Cognitive Cognitive Status: Alert and oriented to person, place, and time, Normal speech and language skills, Insightful and able to interpret abstract concepts Cognitive/Intellectual Conditions Management [RPT]: None reported or documented in medical history or problem list      Neurological Neurological Review of Symptoms: No symptoms reported Neurological Comment: per patient, experiencing short term memory loss, will discuss with her provider during next visit  HEENT HEENT Symptoms Reported: No symptoms reported      Cardiovascular Cardiovascular Symptoms Reported: No symptoms reported Does patient have uncontrolled Hypertension?: No Cardiovascular Conditions: Hypertension Cardiovascular Management Strategies: Routine screening, Medication therapy Cardiovascular Self-Management Outcome: 4 (good)  Respiratory Respiratory Symptoms Reported: No symptoms reported    Endocrine Patient reports the following symptoms related to hypoglycemia or hyperglycemia : No symptoms reported Is patient diabetic?: Yes Is patient checking blood sugars at home?: Yes Endocrine Conditions: Diabetes Endocrine Management  Strategies: Diet modification, Medication therapy Endocrine Self-Management Outcome: 4 (good) Endocrine Comment: Followed by Endocronology, Dr. Wilbern Hancock  saw today, 09/21/23 Next appt in about 4 months  Gastrointestinal Gastrointestinal Symptoms Reported: No symptoms reported      Genitourinary Genitourinary Symptoms Reported: No symptoms reported    Integumentary Integumentary Symptoms Reported: Skin changes Additional Integumentary Details: break out around neck, thick fungus on toes Skin Management Strategies: Medication therapy  Musculoskeletal Musculoskelatal Symptoms Reviewed: No symptoms reported Musculoskeletal Conditions: Osteoporosis Musculoskeletal Management Strategies: Adequate rest, Medication therapy, Routine screening Falls in the past year?: Yes Number of falls in past year: 1 or less Was there an injury with Fall?: Yes Fall Risk Category Calculator: 2 Patient Fall Risk Level: Moderate Fall Risk Patient at Risk for Falls Due to: History of fall(s) Fall risk Follow up: Falls prevention discussed  Psychosocial Psychosocial Symptoms Reported: Other Other Psychosocial Conditions: stress Additional Psychological Details: Patient denies depression and anxiety symptoms-confirms pressure related to life circumstances and demands on her time Behavioral Health Conditions: Other Other Behavorial Health Conditions: stress Behavioral Management Strategies: Adequate rest, Coping strategies, Support system Major Change/Loss/Stressor/Fears (CP): Environment Techniques to Cope with Loss/Stress/Change: Diversional activities, Spiritual practice(s) Quality of Family Relationships: involved, helpful, supportive Do you feel physically threatened by others?: No      09/21/2023    1:16 PM  Depression screen PHQ 2/9  Decreased Interest 0  Down, Depressed, Hopeless 0  PHQ - 2 Score 0      09/21/2023    1:49 PM 09/21/2023    1:29 PM 08/16/2023    9:20 AM 03/22/2023    8:15 AM  GAD 7  : Generalized Anxiety Score  Nervous, Anxious, on Edge  0 0 0  Control/stop worrying 0 1 1 1   Worry too much - different things  1 1 1   Trouble relaxing  0 1 1  Restless  0 0 0  Easily annoyed or irritable  1 0 0  Afraid - awful might happen  0 0 0  Total GAD 7 Score  3 3 3   Anxiety Difficulty   Not difficult at all Not difficult at all      There were no vitals filed for this visit.  Medications Reviewed Today     Reviewed by Ave Leisure, LCSW (Social Worker) on 09/21/23 at 1315  Med List Status: <None>   Medication Order Taking? Sig Documenting Provider Last Dose Status Informant  acetaminophen  (TYLENOL ) 500 MG tablet [id number] Yes Take 1,000 mg by mouth 2 (two) times daily. As needed [provider]  Active   amLODipine  (NORVASC ) 10 MG tablet [id number] Yes Take 1 tablet (10 mg total) by mouth 2 (two) times daily. Sheron Dixons, MD  Active   Apoaequorin (PREVAGEN PO) [id number] Yes Take by mouth. [provider]  Active   Calcium Carbonate-Vit D-Min (CALCIUM 1200 PO) [id number] Yes Take 2 each by mouth daily. [provider]  Active   Cholecalciferol (VITAMIN D ) 2000 UNITS CAPS [id number] Yes Take 1 capsule (2,000 Units total) by mouth daily. Moody Appl, MD  Active   clotrimazole -betamethasone  (LOTRISONE ) cream [id number] Yes Apply 1 Application topically daily. To feet and rash under breasts Sheron Dixons, MD  Active   Continuous Glucose Receiver (FREESTYLE LIBRE 3 READER) [place]VI [id number] Yes Use to monitor glucose levels Lajean Pike, MD  Active   Continuous Glucose Sensor (FREESTYLE LIBRE 3 PLUS SENSOR) [place] [id number] Yes 1 each by Does not apply route continuous. Change every 15 days. [place], [place], MD  Active   Continuous Glucose Sensor (FREESTYLE LIBRE 3 SENSOR) [place] [id number] Yes Place 1 sensor on the skin every 14 days. Use to check glucose continuously Thapa, [place], MD  Active   Glucagon  (GVOKE HYPOPEN  1-PACK) 1 MG/0.2ML SOAJ [id number]   Inject 1 mg into the skin as needed (low blood sugar with impaired consciousness).  Patient not taking: Reported on 09/21/2023   Thapa, [place], MD  Active            Med Note Adan Holms, Jackie Mas Feb 22, 2023  9:35 AM) Has not used this yet   glucose blood [facility] VERIO) test strip [id number]  USE 1 STRIP TO CHECK GLUCOSE 4 TIMES DAILY  Patient not taking: Reported on 09/21/2023   Sheron Dixons, MD  Active   insulin  lispro (HUMALOG ) 100 UNIT/ML injection [id number] Yes Up to 90 units daily in pump [provider]  Active   Insulin  Pen Needle (PEN NEEDLES) 32G X 5 MM MISC [id number] Yes Please Lajean Pike, MD  Active   meloxicam  (MOBIC ) 15 MG tablet [id number] Yes Take 1 tablet (15 mg total) by mouth daily. Sheron Dixons, MD  Active            Med Note [facility], JESSENIA   Tue Sep 04, 2023  3:20 PM) PRN  NON FORMULARY [id number]  CPAP @@ 9 cm H2O nightly  Patient not taking: Reported on 09/21/2023   [provider]  Active   pravastatin  (PRAVACHOL ) 20 MG tablet [id number] Yes Take 1 tablet by mouth once daily  Patient taking differently: Take 20 mg by mouth 3 (three) times a week.   Lajean Pike, MD  Active   Semaglutide  (OZEMPIC ,  1 MG/DOSE, [place]) [id number] Yes Inject into the skin. [provider]  Active   triamcinolone  cream (KENALOG ) 0.5 % [id number] Yes Apply 1 Application topically 3 (three) times daily. To rash on neck Sheron Dixons, MD  Active            Med Note Andy Bannister, PAULA   Thu Aug 30, 2023  2:05 PM) prn            Recommendation:   PCP Follow-up Specialty provider follow-up as scheduled  Follow Up Plan:   Patient has met all care management goals. Care Management case will be closed. Patient has been provided contact information should new needs arise.   Aayden Cefalu, LCSW [place]  [facility], [facility] Licensed Clinical Social Worker  Direct Dial: [phone]

## 2023-09-21 NOTE — Progress Notes (Addendum)
 HPI: Melinda Ford returns today for a f/u for diabetes.   She is a 78 y.o. female who was diagnosed with diabetes around 2004.  She has previously seen Dr. Von in [place].  I first saw her in 3/25.  At that time,  I stopped the MTF due to stomach issues and increased her glipizide.  Now she is off of glipizide.  She is currently on Ozempic  1 mg,  Humalog  via V-go 20  (she take 4 clicks with supper).  She is off of  glipizide as above (due to restarting the V-go) and  MTF (due to stomach issues).   She uses the [facility] for glucose monitoring.  It was downloaded and reviewed.  The average was 190.  Her time in range was 43%.  She sometimes get lows during the night.   Review of her diet shows she avoids concentrated carbs.  Her weight is down 2 pounds since last visit.  She is concerned about her diabetes.  She reports some numbness in her feet.  ROS:  No chest pain.  No SOB.   Medical History: Past Medical History:  Diagnosis Date  . Allergy to wheat    wheat/grass - no anaphylaxis/has periodic itching to unknown cause  . Bradycardia   . Chronic hoarseness 10/10/2010  . Essential hypertension, benign   . GERD (gastroesophageal reflux disease)   . HLD (hyperlipidemia) 03/19/2012  . Lumbago   . Neuropathy    meralgia paresthetica  . OSTEOPOROSIS   . Osteoporosis, unspecified 10/10/2010  . Pain due to onychomycosis of toenail    all toenails  . Type II or unspecified type diabetes mellitus without mention of complication, uncontrolled   . Valvular heart disease     Surgical History: Past Surgical History:  Procedure Laterality Date  . HYSTERECTOMY  2001   FOR FIBROIDS  . COLONOSCOPY  03/08/2011  . Debridement of toenails    . REPAIR TENDONS FOOT     RIGHT FOOT    Social History:  reports that she has never smoked. She has never used smokeless tobacco. She reports that she does not drink alcohol and does not use drugs.  Family History: family history includes Breast  cancer in an other family member; Coronary Artery Disease (Blocked arteries around heart) in an other family member; Dementia in her mother; Diabetes in her brother, father, mother, and sister; Heart disease in her father; High blood pressure (Hypertension) in her father and mother; Hyperlipidemia (Elevated cholesterol) in her father; Stroke in her mother.  Medications: Current Outpatient Medications  Medication Sig Dispense Refill  . acetaminophen  (TYLENOL ) 500 MG tablet Take by mouth.    . amLODIPine  (NORVASC ) 10 MG tablet     . apoaequorin (PREVAGEN) capsule Take 1 capsule by mouth once daily    . blood-glucose meter,continuous (FREESTYLE LIBRE 3 READER) Misc Use 1 each as directed 1 each 0  . blood-glucose sensor (FREESTYLE LIBRE 3 PLUS SENSOR) Devi Use 1 each every 15 (fifteen) days 2 each 12  . cholecalciferol (VITAMIN D3) 2,000 unit capsule Take by mouth.    . clotrimazole -betamethasone  (LOTRISONE ) 1-0.05 % cream Apply topically 2 (two) times daily    . clotrimazole -betamethasone  (LOTRISONE ) 1-0.05 % cream Apply topically 2 (two) times daily 45 g 2  . gabapentin  (NEURONTIN ) 100 MG capsule TAKE 1 - 3 CAPSULES BY MOUTH AT BEDTIME FOLLOW WRITEN TITRATION SCHEDULE    . glipiZIDE (GLUCOTROL XL) 5 MG XL tablet Take 2 tablets (10 mg total) by mouth  once daily 60 tablet 6  . insulin  LISPRO (ADMELOG , HUMALOG ) injection (concentration 100 units/mL) Up to 90 units daily in pump 10 mL 3  . lancets Check blood sugar three times daily  E11.65 100 each 12  . meloxicam  (MOBIC ) 15 MG tablet Take 1 tablet by mouth once daily    . metFORMIN  (GLUCOPHAGE -XR) 500 MG XR tablet Take 1 tablet (500 mg total) by mouth 2 (two) times daily 60 tablet 11  . omeprazole  (PRILOSEC) 20 MG DR capsule     . ondansetron  (ZOFRAN ) 4 MG tablet Take 4 mg by mouth every 8 (eight) hours as needed    . ONETOUCH VERIO test strip     . pravastatin  (PRAVACHOL ) 20 MG tablet     . semaglutide  (OZEMPIC ) 1 mg/dose (4 mg/3 mL) pen  injector Inject 0.75 mLs (1 mg total) subcutaneously once a week 3 mL 11  . triamcinolone  0.5 % cream APPLY CREAM TOPICALLY THREE TIMES DAILY TO RASH ON NECK    . triamcinolone  0.5 % cream Apply topically 2 (two) times daily 30 g 3  . V-GO 20 Devi Inject 1 each subcutaneously daily 30 each 5  . blood glucose meter kit kit by Other route 3 (three) times daily. 1 each 0  . FREESTYLE LIBRE 2 SENSOR kit APPLY 1 KIT EVERY 14 DAYS TO MONITOR BLOOD SUGAR    . insulin  ASPART (NOVOLOG ) injection (concentration 100 units/mL) MAX OF 78 UNITS DAILY VIA INSULIN  PUMP (Patient not taking: Reported on 09/21/2023)    . losartan  (COZAAR ) 100 MG tablet Take 1 tablet (100 mg total) by mouth once daily. (Patient not taking: Reported on 09/21/2023) 90 tablet prn  . multivitamin tablet Take 1 tablet by mouth daily. From [facility] company (Patient not taking: Reported on 09/21/2023)    . semaglutide  (OZEMPIC ) 0.25 mg or 0.5 mg (2 mg/3 mL) pen injector Inject 0.75 mLs (0.5 mg total) subcutaneously once a week (Patient not taking: Reported on 09/21/2023) 3 mL 6   No current facility-administered medications for this visit.    Allergies: Allergies  Allergen Reactions  . Lisinopril Cough  . Wheat Hives  . Ace Inhibitors Cough  . Black Pepper Other (See Comments)    Sneezing  . Other Other (See Comments)    wheat/grass - no anaphylaxis/ pt has periodic itching to unknown cause    Physical Exam: Vitals:   09/21/23 0951  BP: 104/70  Pulse: 71  SpO2: 97%  Weight: 69.4 kg (153 lb)  Height: 157.5 cm (5' 2.01)    Body mass index is 27.98 kg/m. GENERAL: Pleasant, well-appearing female in no distress.   Physical exam otherwise deferred due to coronavirus precautions   Labs: 12/28/2020: TSH = 1.65. 04/12/2022 = 8.0 11/02/2022: A1c = 7.6.  K/Cr/Ca = 4.4/0.7/10.9.  MA= 0.7.  Cholesterol = 178/56/71/96.  TSH normal 02/13/23:  A1c= 7.4.   K/Cr/Ca= 3.6/0.8/10.4.  LFTs nl except for ALT/AST= 85/86.   06/22/2023:   A1c=8.7.  07/18/2023: K/Cr/Ca = 4.3/0.69/10.7, eGFR = 89.  MA = 29.   Chol = 199/94/86/97.   LFTs nl.  TSH = 1.510 09/21/2023: A1c = 8.3.   Assessment/Plan: 1.  Diabetes.  Her diabetes has been modestly uncontrolled.  Her A1c today is 8.3.  We discussed options.  Based on CGM and discussion,  I will increase her Ozempic  to 2 mg once a week.  I sent a script for the 2 mg.   I told her to call me when she is  getting close  to the end of the V-go devices.   Once she is out, I will send in a script for 15 units of glargine daily.  I explained how the directions would look on the insulin  but she is to start on 15 units daily.  Once we switch, I will plan for her to come for a nurse visit for her herlene download  two weeks after changing (to see if we will need to add the glipizide back).   I encouraged lifestyle modifications.   2.  Hypoglycemia.  She had several blood sugars into the lower 50s last visit. Lowest was 53.  This visit, there's less hypoglycemia, but she has had some and her lowest in the past 2 weeks was 56.      3.  HTN associated with diabetes .  BP today is good on only amlodipine .  4.  Hyperlipidemia associated with diabetes.  Cholesterol from 4/25 borderline on pravastatin  three times per week.    5.  ?neuropathy.  She reports numbness in her feet.  She is on gabapentin , but she says it is for back and neck pain, not foot pain/burning.    6.  Prophylaxis.  She sees Dr. Carolee for her eye care.  She saw podiatry in 6/25.    7.  RTC 4 months  09/21/23:  Pt called after she left.  Forgot to ask if ok to go off pravastatin  while podiatry has her on lamisil for toe nail fungus for 30 days.  Told her fine and to restart after finishes lamisil.  11/06/2023: Patient called.  Almost out of V-go.  Sent in glargine 15 units daily  01/03/24:   Pt called.  Stopped ozempic  due to constipation.  On 25 lantus  and sugars still over 200.  Will add back glipizide 5mg  daily.  Has f/u on 10/6 so will review  then.  This note is partially prepared by Earla Daria Messier, Scribe, in the presence of and acting as the scribe of Dr. Debby Breaker , MD.    [facility] [place], MD

## 2023-10-22 ENCOUNTER — Telehealth: Payer: Self-pay

## 2023-10-22 NOTE — Telephone Encounter (Signed)
 Tried to call patient. No answer and No VM set up.  JM

## 2023-10-22 NOTE — Telephone Encounter (Signed)
 Spoke with patient and informed her it would be okay to stop the pravastatin  for the month. She said the doctor wanted her to try Lamisil.  JM

## 2023-10-22 NOTE — Telephone Encounter (Signed)
 Copied from CRM [id number]. Topic: Clinical - Medication Question >> Oct 22, 2023  9:08 AM Everette C wrote: Reason for CRM: The patient would like to discuss a potential month long cessation from their pravastatin  (PRAVACHOL ) 20 MG tablet [[id number]] in order to treat toenail fungus concerns. Please contact the patient further if/when possible The patient has been instructed by their podiatrist to contact their PCP

## 2023-10-23 ENCOUNTER — Encounter: Attending: Internal Medicine | Admitting: Dietician

## 2023-10-23 ENCOUNTER — Ambulatory Visit: Admitting: Dietician

## 2023-10-23 ENCOUNTER — Encounter: Payer: Self-pay | Admitting: Dietician

## 2023-10-23 DIAGNOSIS — Z794 Long term (current) use of insulin: Secondary | ICD-10-CM | POA: Diagnosis not present

## 2023-10-23 DIAGNOSIS — E118 Type 2 diabetes mellitus with unspecified complications: Secondary | ICD-10-CM

## 2023-10-23 DIAGNOSIS — E119 Type 2 diabetes mellitus without complications: Secondary | ICD-10-CM | POA: Diagnosis not present

## 2023-10-23 DIAGNOSIS — Z713 Dietary counseling and surveillance: Secondary | ICD-10-CM | POA: Diagnosis not present

## 2023-10-23 NOTE — Progress Notes (Signed)
 Diabetes Self-Management Education  Visit Type: First/Initial  Appt. Start Time: 1035 Appt. End Time: 1050  10/23/2023  [name], identified by name and date of birth, is a 78 y.o. female with a diagnosis of Diabetes: Type 2.   ASSESSMENT Pt reports Ozempic  weekly since January, just moved up to 2 mg this past week, taking Humalog  via insulin  pump (V-go 20), does 2 clicks (4u) for breakfast, 3-4 clicks  (6-8u) for lunch and dinner, states they are forgetting to dose their mealtime insulin  before they eat at times Pt states they will be d/v V-go and be going to 15u of insulin  glargine daily in August. Pt reports using Freestyle Libre3 CGM, did not bring receiver to visit, states they don't like keeping it with them when they leave the house. Pt reports checking   Pt reports concern over memory loss, states they find themselves searching for words and confused more recently.   Diabetes Self-Management Education - 10/23/23 1054       Visit Information   Visit Type First/Initial      Initial Visit   Diabetes Type Type 2    Date Diagnosed 2000s    Are you currently following a meal plan? No    Are you taking your medications as prescribed? Yes      Health Coping   How would you rate your overall health? Good      Psychosocial Assessment   Patient Belief/Attitude about Diabetes Motivated to manage diabetes    What is the hardest part about your diabetes right now, causing you the most concern, or is the most worrisome to you about your diabetes?   Taking/obtaining medications;Other (comment)   dosing insulin  pump before meals   Self-care barriers None    Self-management support Doctor's office;Family;Friends    Other persons present Patient    Patient Concerns Nutrition/Meal planning;Glycemic Control;Monitoring    Special Needs None    Preferred Learning Style Visual;Hands on    Learning Readiness Contemplating    How often do you need to have someone help you when you read  instructions, pamphlets, or other written materials from your doctor or pharmacy? 1 - Never    What is the last grade level you completed in school? 12th grade      Pre-Education Assessment   Patient understands the diabetes disease and treatment process. Needs Review    Patient understands incorporating nutritional management into lifestyle. Needs Review    Patient undertands incorporating physical activity into lifestyle. Needs Review    Patient understands using medications safely. Needs Review    Patient understands monitoring blood glucose, interpreting and using results Needs Review    Patient understands prevention, detection, and treatment of acute complications. Needs Review    Patient understands prevention, detection, and treatment of chronic complications. Needs Review    Patient understands how to develop strategies to address psychosocial issues. Needs Review    Patient understands how to develop strategies to promote health/change behavior. Needs Review      Complications   Last HgB A1C per patient/outside source 8.3 %   09/21/2023   How often do you check your blood sugar? > 4 times/day   CGM   Number of hypoglycemic episodes per month 8    Can you tell when your blood sugar is low? Yes    What do you do if your blood sugar is low? drink juice or eat ice cream    Number of hyperglycemic episodes ( >200mg /dL): Daily  Can you tell when your blood sugar is high? Yes    Have you had a dilated eye exam in the past 12 months? Yes    Have you had a dental exam in the past 12 months? Yes    Are you checking your feet? Yes    How many days per week are you checking your feet? 3      Dietary Intake   Breakfast Pack of PB nabs    Lunch BBQ sandwich with cole slaw    Dinner [employer], peas, rice    Beverage(s) Tiwst      Activity / Exercise   Activity / Exercise Type ADL's    How many days per week do you exercise? 2    How many minutes per day do you exercise? 45    Total  minutes per week of exercise 90      Patient Education   Previous Diabetes Education Yes    Disease Pathophysiology Explored patient's options for treatment of their diabetes    Healthy Eating Carbohydrate counting;Role of diet in the treatment of diabetes and the relationship between the three main macronutrients and blood glucose level;Plate Method    Being Active Identified with patient nutritional and/or medication changes necessary with exercise.    Medications Taught/reviewed insulin /injectables, injection, site rotation, insulin /injectables storage and needle disposal.;Reviewed patients medication for diabetes, action, purpose, timing of dose and side effects.    Monitoring Taught/evaluated CGM (comment);Identified appropriate SMBG and/or A1C goals.    Acute complications Taught prevention, symptoms, and  treatment of hypoglycemia - the 15 rule.    Chronic complications Relationship between chronic complications and blood glucose control    Diabetes Stress and Support Identified and addressed patients feelings and concerns about diabetes;Worked with patient to identify barriers to care and solutions    Lifestyle and Health Coping Lifestyle issues that need to be addressed for better diabetes care      Individualized Goals (developed by patient)   Nutrition Follow meal plan discussed;Adjust meds/carbs with exercise as discussed    Physical Activity Exercise 1-2 times per week    Medications take my medication as prescribed    Monitoring  Consistenly use CGM;Test my blood glucose as discussed;Send in my blood glucose log as discussed    Problem Solving Medication consistency    Reducing Risk examine blood glucose patterns    Health Coping Ask for help with psychological, social, or emotional issues      Post-Education Assessment   Patient understands the diabetes disease and treatment process. Needs Review    Patient understands incorporating nutritional management into lifestyle. Needs  Review    Patient undertands incorporating physical activity into lifestyle. Needs Review    Patient understands using medications safely. Needs Review    Patient understands monitoring blood glucose, interpreting and using results Needs Review    Patient understands prevention, detection, and treatment of acute complications. Needs Review    Patient understands prevention, detection, and treatment of chronic complications. Needs Review    Patient understands how to develop strategies to address psychosocial issues. Needs Review    Patient understands how to develop strategies to promote health/change behavior. Needs Review      Outcomes   Expected Outcomes Demonstrated interest in learning but significant barriers to change    Future DMSE 2 months    Program Status Not Completed          Individualized Plan for Diabetes Self-Management Training:   Learning Objective:  Patient  will have a greater understanding of diabetes self-management. Patient education plan is to attend individual and/or group sessions per assessed needs and concerns.   Plan:   Patient Instructions  Keep your Libre3 receiver with you AT ALL TIMES. This will be very helpful to help your providers be able to assess your blood sugar control on a daily basis.  Continually check your FreeStyle Libre3 to check for changes in your blood sugar.  Look for your blood sugar to rise no more ~50-60 points after a meal.  Try KETO bread for your sandwiches!  Continue eating three meals a day, about 5-6 hours apart!  Begin to recognize carbohydrates, proteins, and non-starchy vegetables in your food choices!  Begin to build your meals using the proportions of the Balanced Plate. First, select your carb choice(s) for the meal. Make this 25% of your meal. Next, select your source of protein to pair with your carb choice(s). Make this another 25% of your meal. Finally, complete your meal with a variety of non-starchy  vegetables. Make this the remaining 50% of your meal.    Expected Outcomes:  Demonstrated interest in learning but significant barriers to change  Education material provided: My Plate, CGM Overview, CGM Nutrition Conversation  If problems or questions, patient to contact team via:  Phone and Email  Future DSME appointment: 2 months

## 2023-10-23 NOTE — Patient Instructions (Addendum)
 Keep your Liane receiver with you AT ALL TIMES. This will be very helpful to help your providers be able to assess your blood sugar control on a daily basis.  Continually check your FreeStyle Libre3 to check for changes in your blood sugar.  Look for your blood sugar to rise no more ~50-60 points after a meal.  Try KETO bread for your sandwiches!  Continue eating three meals a day, about 5-6 hours apart!  Begin to recognize carbohydrates, proteins, and non-starchy vegetables in your food choices!  Begin to build your meals using the proportions of the Balanced Plate. First, select your carb choice(s) for the meal. Make this 25% of your meal. Next, select your source of protein to pair with your carb choice(s). Make this another 25% of your meal. Finally, complete your meal with a variety of non-starchy vegetables. Make this the remaining 50% of your meal.

## 2023-11-21 ENCOUNTER — Other Ambulatory Visit: Admitting: Pharmacist

## 2023-11-21 DIAGNOSIS — E118 Type 2 diabetes mellitus with unspecified complications: Secondary | ICD-10-CM

## 2023-11-21 NOTE — Progress Notes (Unsigned)
 11/21/2023 Name: Melinda Ford MRN: [id number] DOB: [birth year]  Chief Complaint  Patient presents with   Medication Management    Melinda Ford is a 78 y.o. year old female who was referred to the pharmacist by a quality report for assistance in managing diabetes.   Conversation limited today as patient is not home.  Subjective:  Care Team: Primary Care Provider: Justus Leita DEL, MD; Next Scheduled Visit: 01/17/2024 Endocrinologist Cherilyn Debby Quivers, MD; Next Scheduled Visit: 01/07/2024 Dietitian: Katheleen Aleene ORN, RD; Next Scheduled Visit: 12/06/2023 Podiatrist: Ashley Eva Dawn, DPM; Next Scheduled Visit: 01/07/2024  Medication Access/Adherence  Current Pharmacy:  [facility] [address] [place] - [address] [place] [address] Phone: [phone] Fax: [phone]  [facility] [place], [place] - [address] [place] [address] [place] [address] Phone: [phone] Fax: [phone]  [id number] [place], [place] - [address] AT [facility] [address] [address] [place] [address] Phone: [phone] Fax: [phone]  [facility] [place], [place] - [address] [place] [address] Phone: [phone] Fax: [phone]  [facility] [address], [place] - [address] [place] [address] Phone: [phone] Fax: [phone]   Patient reports affordability concerns with their medications: No  Patient reports access/transportation concerns to their pharmacy: No  Patient reports adherence concerns with their medications:  No     Diabetes:  Patient followed by [facility] Endocrinology - Note at latest Office Visit with Dr. Cherilyn on 09/21/2023, provider advised patient:  Will increase her Ozempic  to 2 mg once a week.  Told patient to call when she is getting close to the end of the V-go devices. Will  send in a script for 15 units of glargine daily.  Once switched from V-go to glargine, plan for her to come for a nurse visit for her herlene download two weeks after changing (to see if we will need to add the glipizide back).  Current medications:  - Ozempic  2 mg weekly on Fridays - Lantus  15 units daily - started ~1 week ago  Medications tried in the past: Humalog  with V-go; glipizide  Reports using Freestyle Libre 3 Plus. Unable to connect via Libreview (uses reader device) Reports uses glucometer as back up to CGM  Recalls blood sugar reading this morning: 166 before breakfast (notes missed her dose of Lantus  the night before)   Patient reports felt 1 episode of hypoglycemia with reading ~70 last week.   Current physical activity: Goes to gym twice weekly  Statin therapy: holding pravastatin  while on terbinafine as prescribed by Podiatrist (PCP aware)   Objective:  Lab Results  Component Value Date   HGBA1C 7.4 (A) 02/13/2023  Per shared record from [facility], latest A1C was 8.3% on 09/21/2023  Lab Results  Component Value Date   CREATININE 0.69 07/18/2023   BUN 21 07/18/2023   NA 138 07/18/2023   K 4.3 07/18/2023   CL 101 07/18/2023   CO2 23 07/18/2023    Lab Results  Component Value Date   CHOL 199 07/18/2023   HDL 86 07/18/2023   LDLCALC 97 07/18/2023   TRIG 94 07/18/2023   CHOLHDL 2.3 07/18/2023    Medications Reviewed Today     Reviewed by Alana Sharyle LABOR, RPH-CPP (Pharmacist) on 11/21/23 at 1129  Med List Status: <None>   Medication Order Taking? Sig Documenting Provider Last Dose Status Informant  acetaminophen  (TYLENOL ) 500 MG tablet [id number]  Take 1,000 mg by mouth 2 (two) times daily. As needed [provider]  Active   amLODipine  (NORVASC ) 10 MG tablet [id number]  Take 1 tablet (10 mg total) by mouth 2 (two) times daily. Justus Leita DEL, MD  Active   Apoaequorin (PREVAGEN PO) [id number]  Take by mouth. [provider]  Active   Calcium Carbonate-Vit D-Min (CALCIUM 1200 PO) [id number]  Take 2 each by mouth daily. [provider]  Active   Cholecalciferol (VITAMIN D ) 2000 UNITS CAPS [id number]  Take 1 capsule (2,000 Units total) by mouth daily. Loria Fallow, MD  Active   clotrimazole -betamethasone  (LOTRISONE ) cream [id number]  Apply 1 Application topically daily. To feet and rash under breasts Justus Leita DEL, MD  Active   Continuous Glucose Receiver (FREESTYLE LIBRE 3 READER) DEVI [id number]  Use to monitor glucose levels Von [place], MD  Active   Continuous Glucose Sensor (FREESTYLE LIBRE 3 PLUS SENSOR) MISC [id number]  1 each by Does not apply route continuous. Change every 15 days. Thapa, [place], MD  Active   Continuous Glucose Sensor (FREESTYLE LIBRE 3 SENSOR) [place] [id number]  Place 1 sensor on the skin every 14 days. Use to check glucose continuously Thapa, [place], MD  Active   Glucagon  (GVOKE HYPOPEN  1-PACK) 1 MG/0.2ML SOAJ [id number]  Inject 1 mg into the skin as needed (low blood sugar with impaired consciousness).  Patient not taking: Reported on 09/21/2023   Thapa, [place], MD  Active            Med Note OCTAVIA, CHRISSIE KANDICE Schaumann Feb 22, 2023  9:35 AM) Has not used this yet   glucose blood (ONETOUCH VERIO) test strip [id number]  USE 1 STRIP TO CHECK GLUCOSE 4 TIMES DAILY  Patient not taking: Reported on 09/21/2023   Justus Leita DEL, MD  Active    Patient not taking:   Discontinued 11/21/23 1129 (Change in therapy)   Insulin  Pen Needle (PEN NEEDLES) 32G X 5 MM MISC [id number]  Please Von Pacific, MD  Active   LANTUS  SOLOSTAR 100 UNIT/ML Solostar Pen [id number] Yes Inject 15 Units into the skin daily. [provider]  Active   meloxicam  (MOBIC ) 15 MG tablet [id number]  Take 1 tablet (15 mg total) by mouth daily. Justus Leita DEL, MD  Active            Med Note [facility], JESSENIA   Tue Sep 04, 2023  3:20 PM) PRN  NON FORMULARY [id number]  CPAP @@ 9 cm H2O nightly  Patient not  taking: Reported on 09/21/2023   [provider]  Active   OZEMPIC , 2 MG/DOSE, 8 MG/3ML SOPN [id number] Yes Inject 2 mg into the skin once a week. [provider]  Active   pravastatin  (PRAVACHOL ) 20 MG tablet [id number]  Take 1 tablet by mouth once daily  Patient taking differently: Take 20 mg by mouth 3 (three) times a week.   Von Pacific, MD  Active     Discontinued 11/21/23 1118 (Dose change)   terbinafine (LAMISIL) 250 MG tablet [id number] Yes Take 250 mg by mouth daily. [provider]  Active   triamcinolone  cream (KENALOG ) 0.5 % [id number]  Apply 1 Application topically 3 (three) times daily. To rash on neck Justus Leita DEL, MD  Active            Med Note [place],  PAULA   Thu Aug 30, 2023  2:05 PM) prn              Assessment/Plan:   Patient to contact Endocrinology office today to schedule nurse visit for her [facility] download ~2 weeks after start of Lantus  (switched from V-go) as recommended by Dr Cherilyn.  Unable to complete medication review today as patient is not currently home, but agree to complete during next telephone appointment  Diabetes: - Currently uncontrolled based off of latest A1C result - Reviewed goal A1c, goal fasting, and goal 2 hour post prandial glucose - Reviewed importance of having regular well-balanced meals and snacks throughout the day, while controlling carbohydrate portion sizes - Counsel patient on s/s of low blood sugar and how to treat lows Review rules of 15s - importance of using 15 grams of sugar to treat low, recheck blood sugar in 15 minutes, treat again if remains low or, if back to normal, having meal if mealtime or snack  Review examples of sources of 15 grams of sugar Encourage patient to pick up and carry glucose tablets with him in case needed for low blood sugar - Recommend to check glucose, keep log of results and have this record to review at upcoming medical appointments. Patient to contact provider  office sooner if needed for readings outside of established parameters or symptoms   Follow Up Plan: Clinical Pharmacist will follow up with patient by telephone on 12/10/2023 at 1:30 PM   Sharyle Sia, PharmD, [facility] [phone]

## 2023-11-23 NOTE — Patient Instructions (Addendum)
 Please follow up with [facility] Endocrinology office as previously planned to schedule a nurse visit to have your [facility] Reader results downloaded as you have recently started the Lantus  insulin .  Cherilyn Debby Quivers, MD Endocrinology NPI: [id number] [address] [place] [address]   Phone: [phone]     Hypoglycemia is low blood glucose--or low blood sugar--that is below the healthy range. This is usually  when your blood glucose is less than 70 mg/dL, but you should also monitor for hypoglycemia based on symptoms.  Symptoms of hypoglycemia may include feeling: - Shaky - Sweaty - Dizziness - Confusion or difficulty speaking - Feeling weak or tired - Or you may have no symptoms at all   If low blood glucose is not treated, it can become severe and may cause you to pass out   Check your blood glucose right away if you have any symptoms of low blood sugar ( If you think your blood glucose is low but cannot check it at that time, treat anyway)  Treat by eating or drinking 15 grams of something high in sugar, such as:  4 ounces ( cup) of regular fruit juice (like orange, apple, or grape juice)  4 glucose tablets or 1 tube of glucose gel  1 tablespoon of sugar, honey, or corn syrup  4 ounces ( cup) of regular soda pop (not diet)  2 tablespoons of raisins  Wait 15 minutes and then check your blood glucose again - If it is still low, eat or drink something high in sugar again -  If your next meal is more than an hour away, eat a snack to keep your low blood glucose from coming back  Please contact your provider's office if you have concerns about your blood sugar, particularly if you have symptoms of low blood sugar.  Thank you!  Sharyle Sia, PharmD, [facility] [phone]

## 2023-12-06 ENCOUNTER — Encounter: Payer: Self-pay | Admitting: Dietician

## 2023-12-06 ENCOUNTER — Encounter: Attending: Internal Medicine | Admitting: Dietician

## 2023-12-06 DIAGNOSIS — E118 Type 2 diabetes mellitus with unspecified complications: Secondary | ICD-10-CM | POA: Insufficient documentation

## 2023-12-06 NOTE — Patient Instructions (Addendum)
 Try Halo Top ice cream for a lower calorie, lower sugar ice cream.  Look for higher protein, low carbohydrate snack option in the protein supplement section of your grocery store.  Take a 20-30 minute walk 2-3 times a week to help burn some fat!  Desired Ranges on GCM Report: Very Low (below 54 mg/dL): <8% Low (below 70 mg/dL): <5% Time in Range (70 - 180 mg/dL): >29% High (819 - 749 mg/dL): <74% Very High (above 250 mg/dL): <4%

## 2023-12-06 NOTE — Progress Notes (Signed)
 Diabetes Self-Management Education  Visit Type: Follow-up  Appt. Start Time: 0945 Appt. End Time: 1020  12/06/2023  [name], identified by name and date of birth, is a 78 y.o. female with a diagnosis of Diabetes:  .   ASSESSMENT Pt reports switching to Lantus  @15u  daily from V-go device last month, states they like it much better and is seeing improvement in glucose control since making the switch. Pt reports taking Ozempic  @2  mg now, reports constipation since dose increase (~1 month), states they would like to reduce their dose and eventually come off of this medication.  Pt brought FreeStyle Libre3 reader to visit: CGM Results from download:   Average glucose:   158 mg/dL for 90 days  Time in range (70-180 mg/dL):   72 %   (Goal >29%)  Time High (181-250 mg/dL):   28 %   (Goal < 74%)  Time Low (54-69 mg/dL):   0 %   (Goal <5%)   Pt report eating breakfast, snacks during the afternoon, and will cook dinner at home most nights. Pt reports noticing that eating sweets will run glucose very high, also notices the eating salad with protein keeps their glucose very even. Pt reports walking around neighborhood with husband in the evening for ~20 minutes sometimes   Diabetes Self-Management Education - 12/06/23 1156       Visit Information   Visit Type Follow-up      Pre-Education Assessment   Patient understands the diabetes disease and treatment process. Comprehends key points    Patient understands incorporating nutritional management into lifestyle. Needs Review    Patient undertands incorporating physical activity into lifestyle. Needs Review    Patient understands using medications safely. Comprehends key points    Patient understands monitoring blood glucose, interpreting and using results Comprehends key points    Patient understands prevention, detection, and treatment of acute complications. Comprehends key points    Patient understands prevention, detection, and  treatment of chronic complications. Compreheands key points    Patient understands how to develop strategies to address psychosocial issues. Needs Review    Patient understands how to develop strategies to promote health/change behavior. Needs Review      Complications   How often do you check your blood sugar? > 4 times/day    Fasting Blood glucose range (mg/dL) 29-870;869-820    Postprandial Blood glucose range (mg/dL) 819-799;869-820      Dietary Intake   Breakfast Oatmeal, 2 strips bacon    Lunch [employer] chicken sandwich, a couple fries, Twist    Dinner Hot dog, bread, popcorn    Beverage(s) Twist, water       Activity / Exercise   Activity / Exercise Type ADL's      Patient Education   Healthy Eating Information on hints to eating out and maintain blood glucose control.;Meal options for control of blood glucose level and chronic complications.    Medications Taught/reviewed insulin /injectables, injection, site rotation, insulin /injectables storage and needle disposal.;Reviewed patients medication for diabetes, action, purpose, timing of dose and side effects.    Monitoring Taught/evaluated CGM (comment)   TIR, Daily Patterns   Chronic complications Relationship between chronic complications and blood glucose control      Individualized Goals (developed by patient)   Nutrition Follow meal plan discussed    Medications take my medication as prescribed    Monitoring  Consistenly use CGM      Patient Self-Evaluation of Goals - Patient rates self as meeting previously set goals (% of  time)   Nutrition 50 - 75 % (half of the time)    Physical Activity 25 - 50% (sometimes)    Medications >75% (most of the time)    Monitoring >75% (most of the time)    Problem Solving and behavior change strategies  25 - 50% (sometimes)    Reducing Risk (treating acute and chronic complications) 50 - 75 % (half of the time)    Health Coping 25 - 50% (sometimes)      Post-Education Assessment    Patient understands the diabetes disease and treatment process. Comprehends key points    Patient understands incorporating nutritional management into lifestyle. Comprehends key points    Patient undertands incorporating physical activity into lifestyle. Comprehends key points    Patient understands using medications safely. Demonstrates understanding / competency    Patient understands monitoring blood glucose, interpreting and using results Comprehends key points    Patient understands prevention, detection, and treatment of acute complications. Comprehends key points    Patient understands prevention, detection, and treatment of chronic complications. Comprehends key points    Patient understands how to develop strategies to address psychosocial issues. Comprehends key points    Patient understands how to develop strategies to promote health/change behavior. Comprehends key points      Outcomes   Expected Outcomes Demonstrated interest in learning. Expect positive outcomes    Future DMSE 3-4 months    Program Status Not Completed      Subsequent Visit   Since your last visit have you continued or begun to take your medications as prescribed? Yes    Since your last visit have you had your blood pressure checked? Yes    Is your most recent blood pressure lower, unchanged, or higher since your last visit? Lower    Since your last visit have you experienced any weight changes? Loss    Since your last visit, are you checking your blood glucose at least once a day? Yes   CGM         Individualized Plan for Diabetes Self-Management Training:   Learning Objective:  Patient will have a greater understanding of diabetes self-management. Patient education plan is to attend individual and/or group sessions per assessed needs and concerns.   Plan:   Patient Instructions  Try Halo Top ice cream for a lower calorie, lower sugar ice cream.  Look for higher protein, low carbohydrate snack  option in the protein supplement section of your grocery store.  Take a 20-30 minute walk 2-3 times a week to help burn some fat!  Desired Ranges on GCM Report: Very Low (below 54 mg/dL): <8% Low (below 70 mg/dL): <5% Time in Range (70 - 180 mg/dL): >29% High (819 - 749 mg/dL): <74% Very High (above 250 mg/dL): <4%   Expected Outcomes:  Demonstrated interest in learning. Expect positive outcomes  If problems or questions, patient to contact team via:  Phone and Email  Future DSME appointment: 3-4 months

## 2023-12-10 ENCOUNTER — Other Ambulatory Visit: Payer: Self-pay | Admitting: Pharmacist

## 2023-12-10 ENCOUNTER — Telehealth: Payer: Self-pay | Admitting: Pharmacist

## 2023-12-10 DIAGNOSIS — E118 Type 2 diabetes mellitus with unspecified complications: Secondary | ICD-10-CM

## 2023-12-10 DIAGNOSIS — I10 Essential (primary) hypertension: Secondary | ICD-10-CM

## 2023-12-10 NOTE — Patient Instructions (Signed)
 Please follow up with [facility] Endocrinology office as previously planned to schedule a nurse visit to have your [facility] Reader results downloaded as you have recently started the Lantus  insulin .   Cherilyn Debby Quivers, MD Endocrinology NPI: [id number] [address] [place] [address]   Phone: [phone]         Hypoglycemia is low blood glucose--or low blood sugar--that is below the healthy range. This is usually  when your blood glucose is less than 70 mg/dL, but you should also monitor for hypoglycemia based on symptoms.   Symptoms of hypoglycemia may include feeling: - Shaky - Sweaty - Dizziness - Confusion or difficulty speaking - Feeling weak or tired - Or you may have no symptoms at all     If low blood glucose is not treated, it can become severe and may cause you to pass out     Check your blood glucose right away if you have any symptoms of low blood sugar ( If you think your blood glucose is low but cannot check it at that time, treat anyway)   Treat by eating or drinking 15 grams of something high in sugar, such as:  4 ounces ( cup) of regular fruit juice (like orange, apple, or grape juice)  4 glucose tablets or 1 tube of glucose gel  1 tablespoon of sugar, honey, or corn syrup  4 ounces ( cup) of regular soda pop (not diet)  2 tablespoons of raisins   Wait 15 minutes and then check your blood glucose again - If it is still low, eat or drink something high in sugar again -  If your next meal is more than an hour away, eat a snack to keep your low blood glucose from coming back   Please contact your provider's office if you have concerns about your blood sugar, particularly if you have symptoms of low blood sugar.    Check your blood pressure once weekly, and any time you have concerning symptoms like headache, chest pain, dizziness, shortness of breath, or vision changes.    To appropriately check your blood pressure, make  sure you do the following:  1) Avoid caffeine, exercise, or tobacco products for 30 minutes before checking. Empty your bladder. 2) Sit with your back supported in a flat-backed chair. Rest your arm on something flat (arm of the chair, table, etc). 3) Sit still with your feet flat on the floor, resting, for at least 5 minutes.  4) Check your blood pressure. Take 1-2 readings.  5) Write down these readings and bring with you to any provider appointments.  Bring your home blood pressure machine with you to a provider's office for accuracy comparison at least once a year.   Make sure you take your blood pressure medications before you come to any office visit, even if you were asked to fast for labs.   Thank you!   Sharyle Sia, PharmD, [facility] [phone]

## 2023-12-10 NOTE — Progress Notes (Signed)
 12/10/2023 Name: Melinda Ford MRN: [id number] DOB: [birth year]  Chief Complaint  Patient presents with   Medication Management    Melinda Ford is a 78 y.o. year old female who presented for a telephone visit.   They were referred to the pharmacist by a quality report for assistance in managing diabetes.    Subjective:  Care Team: Primary Care Provider: Justus Leita DEL, MD; Next Scheduled Visit: 01/17/2024 Endocrinologist Cherilyn Debby Quivers, MD; Next Scheduled Visit: 01/07/2024 Dietitian: Katheleen Aleene ORN, RD; Next Scheduled Visit: 03/06/2024 Podiatrist: Ashley Eva Dawn, DPM; Next Scheduled Visit: 01/07/2024    Medication Access/Adherence  Current Pharmacy:  [facility] [address], [place] - [address] [place] [address] Phone: [phone] Fax: [phone]  [facility] [place], [place] - [address] [place] [address] Phone: [phone] Fax: [phone]  [id number] [place], [place] - [address] AT [facility] [address] [place] [address] [place] [address] Phone: [phone] Fax: [phone]  [facility] [place], [place] - [address] [place] [address] Phone: [phone] Fax: [phone]  [facility] [address] [place] - [address] [place] [address] Phone: [phone] Fax: [phone]   Patient reports affordability concerns with their medications: No  Patient reports access/transportation concerns to their pharmacy: No  Patient reports adherence concerns with their medications:  No     Note at latest Office Visit with Dr. Cherilyn on 09/21/2023, provider advised patient:             Will increase her Ozempic  to 2 mg once a week.  Told patient to call when she is getting close to the end of the V-go devices. Will send in a script for 15 units of glargine daily.  Once switched  from V-go to glargine, plan for her to come for a nurse visit for her herlene download two weeks after changing (to see if we will need to add the glipizide back).  Today patient reports has not yet gone by Endocrinology for nurse visit for [facility] download   Diabetes:   Patient followed by [facility] End[facility]  Current medications:  - Ozempic  2 mg weekly on Fridays Reports tolerating, but has noticed increase in constipation. Reports managing constipation well with an over the counter laxative called Shaklee - Lantus  15 units daily - started ~1 week ago   Medications tried in the past: Humalog  with V-go; glipizide   Reports using Freestyle Libre 3 Plus. Unable to connect via Libreview (uses reader device) Reports uses glucometer as back up to CGM   Recalls blood sugar reading currently: 199 after lunch  Reviewed reader device and reports average blood sugar for past 14 days: 151 - Patient unable to navigate to other CGM data today, but will bring reader to Endocrinology office   Denies recent episodes of hypoglycemia   Current physical activity: Goes to gym twice weekly   Statin therapy: pravastatin  20 mg three times weekly    Hypertension:  Current medications: amlodipine  10 mg daily  Reports has a home BP monitor, but denies checking home BP recently  Patient denies hypotensive s/sx including dizziness, lightheadedness.    Current physical activity: Goes to gym twice weekly    Objective:  Lab Results  Component Value Date   HGBA1C 7.4 (A)  02/13/2023  Per shared record from [facility], latest A1C was 8.3% on 09/21/2023    Lab Results  Component Value Date   CREATININE 0.69 07/18/2023   BUN 21 07/18/2023   NA 138 07/18/2023   K 4.3 07/18/2023   CL 101 07/18/2023   CO2 23 07/18/2023    Lab Results  Component Value Date   CHOL 199 07/18/2023   HDL 86 07/18/2023   LDLCALC 97 07/18/2023   TRIG 94 07/18/2023   CHOLHDL 2.3  07/18/2023   BP Readings from Last 3 Encounters:  09/04/23 122/72  08/28/23 132/70  08/16/23 124/66   Pulse Readings from Last 3 Encounters:  09/04/23 (!) 54  08/28/23 79  08/16/23 69     Current Outpatient Medications on File Prior to Visit  Medication Sig Dispense Refill   amLODipine  (NORVASC ) 10 MG tablet Take 1 tablet (10 mg total) by mouth 2 (two) times daily. (Patient taking differently: Take 10 mg by mouth daily.) 180 tablet 1   LANTUS  SOLOSTAR 100 UNIT/ML Solostar Pen Inject 15 Units into the skin daily.     pravastatin  (PRAVACHOL ) 20 MG tablet Take 1 tablet by mouth once daily (Patient taking differently: Take 20 mg by mouth 3 (three) times a week.) 90 tablet 2   acetaminophen  (TYLENOL ) 500 MG tablet Take 1,000 mg by mouth 2 (two) times daily. As needed     Apoaequorin (PREVAGEN PO) Take by mouth.     Calcium Carbonate-Vit D-Min (CALCIUM 1200 PO) Take 2 each by mouth daily.     Cholecalciferol (VITAMIN D ) 2000 UNITS CAPS Take 1 capsule (2,000 Units total) by mouth daily. 30 capsule    clotrimazole -betamethasone  (LOTRISONE ) cream Apply 1 Application topically daily. To feet and rash under breasts 45 g 3   Continuous Glucose Receiver (FREESTYLE LIBRE 3 READER) DEVI Use to monitor glucose levels 1 each 0   Continuous Glucose Sensor (FREESTYLE LIBRE 3 PLUS SENSOR) MISC 1 each by Does not apply route continuous. Change every 15 days. 6 each 3   Continuous Glucose Sensor (FREESTYLE LIBRE 3 SENSOR) MISC Place 1 sensor on the skin every 14 days. Use to check glucose continuously 6 each 3   Glucagon  (GVOKE HYPOPEN  1-PACK) 1 MG/0.2ML SOAJ Inject 1 mg into the skin as needed (low blood sugar with impaired consciousness). (Patient not taking: Reported on 09/21/2023) 0.4 mL 2   glucose blood (ONETOUCH VERIO) test strip USE 1 STRIP TO CHECK GLUCOSE 4 TIMES DAILY (Patient not taking: Reported on 09/21/2023) 300 each 0   Insulin  Pen Needle (PEN NEEDLES) 32G X 5 MM MISC Please 50 each 0    meloxicam  (MOBIC ) 15 MG tablet Take 1 tablet (15 mg total) by mouth daily. 30 tablet 0   NON FORMULARY CPAP @@ 9 cm H2O nightly (Patient not taking: Reported on 09/21/2023)     OZEMPIC , 2 MG/DOSE, 8 MG/3ML SOPN Inject 2 mg into the skin once a week.     terbinafine (LAMISIL) 250 MG tablet Take 250 mg by mouth daily. (Patient not taking: Reported on 12/10/2023)     triamcinolone  cream (KENALOG ) 0.5 % Apply 1 Application topically 3 (three) times daily. To rash on neck 30 g 0   No current facility-administered medications on file prior to visit.        Assessment/Plan:   Patient to contact Endocrinology office today to schedule nurse visit for her [facility] download since start of Lantus  (switched from V-go) as recommended by Dr Cherilyn. - Plans to  discuss Ozempic  dose with Endocrinologist. Reports interested in reducing dose to see if improves her constipation   Diabetes: - Currently uncontrolled based off of latest A1C result - Reviewed goal A1c, goal fasting, and goal 2 hour post prandial glucose - Reviewed importance of having regular well-balanced meals and snacks throughout the day, while controlling carbohydrate portion sizes - Have counseled patient on s/s of low blood sugar and how to treat lows Encourage patient to pick up and carry glucose tablets with her in case needed for low blood sugar - Recommend to check glucose, keep log of results and have this record to review at upcoming medical appointments. Patient to contact provider office sooner if needed for readings outside of established parameters or symptoms    Hypertension: - Reviewed long term cardiovascular and renal outcomes of uncontrolled blood pressure - Recommend to monitor home blood pressure, keep log of results and have this record to review at upcoming medical appointments. Patient to contact provider office sooner if needed for readings outside of established parameters or symptoms     Follow Up Plan: Clinical  Pharmacist will follow up with patient by telephone on 02/13/2024 at 1:30 PM     Sharyle Sia, PharmD, [facility] [phone]

## 2023-12-10 NOTE — Progress Notes (Signed)
   Outreach Note  12/10/2023 Name: Melinda Ford MRN: [id number] DOB: 1945/10/10  Referred by: Justus Leita DEL, MD  Was unable to reach patient via telephone today and have left HIPAA compliant voicemail asking patient to return my call.    Sharyle Sia, PharmD, [facility] [phone]

## 2023-12-13 ENCOUNTER — Ambulatory Visit (INDEPENDENT_AMBULATORY_CARE_PROVIDER_SITE_OTHER): Admitting: Internal Medicine

## 2023-12-13 ENCOUNTER — Ambulatory Visit: Payer: Self-pay

## 2023-12-13 ENCOUNTER — Encounter: Payer: Self-pay | Admitting: Internal Medicine

## 2023-12-13 VITALS — BP 118/68 | HR 71 | Ht 62.0 in | Wt 151.0 lb

## 2023-12-13 DIAGNOSIS — K5732 Diverticulitis of large intestine without perforation or abscess without bleeding: Secondary | ICD-10-CM | POA: Diagnosis not present

## 2023-12-13 MED ORDER — AMOXICILLIN-POT CLAVULANATE 875-125 MG PO TABS: 1.0000 | ORAL_TABLET | Freq: Two times a day (BID) | ORAL | 0 refills | Status: AC

## 2023-12-13 NOTE — Telephone Encounter (Signed)
 FYI Only or Action Required?: FYI only for provider.  Patient was last seen in primary care on 09/04/2023 by Alvia Selinda PARAS, MD.  Called Nurse Triage reporting Abdominal Pain.  Symptoms began yesterday.  Interventions attempted: OTC medications: pepto.  Symptoms are: unchanged.  Triage Disposition: See HCP Within 4 Hours (Or PCP Triage)  Patient/caregiver understands and will follow disposition?: Yes       Copied from CRM #[id number]. Topic: Clinical - Red Word Triage >> Dec 13, 2023  8:03 AM Antwanette L wrote: Red Word that prompted transfer to Nurse Triage: Pt is reporting lower(left) intestine pain starting yesterday afternoon Reason for Disposition  [1] MILD-MODERATE pain AND [2] constant AND [3] age > 60 years  Answer Assessment - Initial Assessment Questions 1. LOCATION: Where does it hurt?      Lower abd/ lower intestine 2. RADIATION: Does the pain shoot anywhere else? (e.g., chest, back)     denies 3. ONSET: When did the pain begin? (e.g., minutes, hours or days ago)      yesterday 4. SUDDEN: Gradual or sudden onset?     sudden 5. PATTERN Does the pain come and go, or is it constant?     Constant, worse 6. SEVERITY: How bad is the pain?  (e.g., Scale 1-10; mild, moderate, or severe)     It varies 7. RECURRENT SYMPTOM: Have you ever had this type of stomach pain before? If Yes, ask: When was the last time? and What happened that time?      First time 8. CAUSE: What do you think is causing the stomach pain? (e.g., gallstones, recent abdominal surgery)     unknown 9. RELIEVING/AGGRAVATING FACTORS: What makes it better or worse? (e.g., antacids, bending or twisting motion, bowel movement)     Reports pepto bisomol but unknown if provided relief 10. OTHER SYMPTOMS: Do you have any other symptoms? (e.g., back pain, diarrhea, fever, urination pain, vomiting)       denies 11. PREGNANCY: Is there any chance you are pregnant? When was your  last menstrual period?       N/a  Protocols used: Abdominal Pain - Female-A-AH

## 2023-12-13 NOTE — Progress Notes (Signed)
 Date:  12/13/2023   Name:  Melinda Ford   DOB:  22-Aug-1945   MRN:  [id number]   Chief Complaint: Abdominal Pain  Abdominal Pain This is a new problem. Episode onset: X1 days. The onset quality is sudden. Episode frequency: comes and goes. The problem has been gradually worsening. The pain is located in the LLQ. The pain is mild. The quality of the pain is dull and sharp. The abdominal pain does not radiate. Associated symptoms include constipation. Pertinent negatives include no diarrhea, fever, nausea or vomiting. Nothing aggravates the pain. The pain is relieved by Nothing. She has tried acetaminophen  (pepto) for the symptoms. The treatment provided mild relief.    Medication list has been reviewed and updated.  Current Meds  Medication Sig   acetaminophen  (TYLENOL ) 500 MG tablet Take 1,000 mg by mouth 2 (two) times daily. As needed   amLODipine  (NORVASC ) 10 MG tablet Take 1 tablet (10 mg total) by mouth 2 (two) times daily. (Patient taking differently: Take 10 mg by mouth daily.)   amoxicillin -clavulanate (AUGMENTIN ) 875-125 MG tablet Take 1 tablet by mouth 2 (two) times daily for 10 days.   Apoaequorin (PREVAGEN PO) Take by mouth.   Calcium Carbonate-Vit D-Min (CALCIUM 1200 PO) Take 2 each by mouth daily.   Cholecalciferol (VITAMIN D ) 2000 UNITS CAPS Take 1 capsule (2,000 Units total) by mouth daily.   clotrimazole -betamethasone  (LOTRISONE ) cream Apply 1 Application topically daily. To feet and rash under breasts   Continuous Glucose Receiver (FREESTYLE LIBRE 3 READER) DEVI Use to monitor glucose levels   Continuous Glucose Sensor (FREESTYLE LIBRE 3 PLUS SENSOR) MISC 1 each by Does not apply route continuous. Change every 15 days.   Continuous Glucose Sensor (FREESTYLE LIBRE 3 SENSOR) MISC Place 1 sensor on the skin every 14 days. Use to check glucose continuously   Glucagon  (GVOKE HYPOPEN  1-PACK) 1 MG/0.2ML SOAJ Inject 1 mg into the skin as needed (low blood sugar with impaired  consciousness).   glucose blood (ONETOUCH VERIO) test strip USE 1 STRIP TO CHECK GLUCOSE 4 TIMES DAILY   Insulin  Pen Needle (PEN NEEDLES) 32G X 5 MM MISC Please   LANTUS  SOLOSTAR 100 UNIT/ML Solostar Pen Inject 15 Units into the skin daily.   meloxicam  (MOBIC ) 15 MG tablet Take 1 tablet (15 mg total) by mouth daily.   OZEMPIC , 2 MG/DOSE, 8 MG/3ML SOPN Inject 2 mg into the skin once a week.   pravastatin  (PRAVACHOL ) 20 MG tablet Take 1 tablet by mouth once daily   triamcinolone  cream (KENALOG ) 0.5 % Apply 1 Application topically 3 (three) times daily. To rash on neck   [DISCONTINUED] insulin  aspart (NOVOLOG ) 100 UNIT/ML injection Inject 100 mLs into the skin.     Review of Systems  Constitutional:  Negative for chills, fatigue and fever.  Respiratory:  Negative for chest tightness and shortness of breath.   Gastrointestinal:  Positive for abdominal pain and constipation. Negative for diarrhea, nausea and vomiting.  Psychiatric/Behavioral:  Negative for dysphoric mood and sleep disturbance. The patient is not nervous/anxious.     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   Chronic pain syndrome 03/17/2021   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/2021   Cervical paraspinal muscle spasm 02/10/2021  Gastroesophageal reflux disease 12/28/2020   History of colonic polyps    Polyp of sigmoid colon    Abdominal pain, epigastric    Patient noncompliant with statin medication 09/12/2019   Type II diabetes mellitus with complication (HCC) 06/04/2019   Hyperlipidemia associated with type 2 diabetes mellitus (HCC) 05/17/2018   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 02/22/2018   Chronic midline thoracic back pain  02/22/2018   Tinea versicolor 09/20/2016   OSA (obstructive sleep apnea) 08/23/2016   Allergic rhinitis 07/13/2016   Overweight (BMI 25.0-29.9) 10/01/2015   Vitamin D  deficiency 09/18/2014   Onychomycosis 09/16/2014   Degenerative disc disease, lumbar 09/16/2014   Benign essential HTN 10/10/2010   Chronic right-sided low back pain with right-sided sciatica 10/10/2010   Osteoporosis 10/10/2010    Allergies  Allergen Reactions   Ace Inhibitors Cough   Wheat Hives   Gramineae Pollens     Immunization History  Administered Date(s) Administered   Fluad Quad(high Dose 65+) 12/09/2019, 12/28/2020   Fluad Trivalent(High Dose 65+) 01/02/2023   INFLUENZA, HIGH DOSE SEASONAL PF 01/25/2016, 01/22/2017, 01/18/2018   Influenza,inj,Quad PF,6+ Mos 02/24/2014, 01/15/2015   Influenza-Unspecified 01/09/2011, 12/15/2011   Moderna Sars-Covid-2 Vaccination 05/09/2019, 06/06/2019, 02/10/2020, 03/25/2021   PNEUMOCOCCAL CONJUGATE-20 03/22/2023   Pneumococcal Conjugate-13 04/16/2014   Pneumococcal Polysaccharide-23 05/06/2010   Respiratory Syncytial Virus Vaccine,Recomb Aduvanted(Arexvy) 09/20/2022   Tdap 01/22/2017   Zoster Recombinant(Shingrix) 01/18/2018, 11/12/2018   Zoster, Live 10/25/2010    Past Surgical History:  Procedure Laterality Date   ABDOMINAL HYSTERECTOMY     CATARACT EXTRACTION Right 2020   COLONOSCOPY WITH PROPOFOL  N/A 10/10/2019   Procedure: COLONOSCOPY WITH PROPOFOL ;  Surgeon: Jinny Carmine, MD;  [facility];  Service: Endoscopy;  Laterality: N/A;   ESOPHAGOGASTRODUODENOSCOPY (EGD) WITH PROPOFOL  N/A 10/10/2019   Procedure: ESOPHAGOGASTRODUODENOSCOPY (EGD) WITH PROPOFOL ;  Surgeon: Jinny Carmine, MD;  Location: [facility];  Service: Endoscopy;  Laterality: N/A;  Diabetic - insulin  pump and oral meds sleep apnea   POLYPECTOMY  10/10/2019   Procedure: POLYPECTOMY;  Surgeon: Jinny Carmine, MD;  Location: [facility];  Service: Endoscopy;;    Social  History   Tobacco Use   Smoking status: Never    Passive exposure: Past   Smokeless tobacco: Never  Vaping Use   Vaping status: Never Used  Substance Use Topics   Alcohol use: Not Currently    Alcohol/week: 1.0 standard drink of alcohol    Types: 1 Standard drinks or equivalent per week   Drug use: Never    Family History  Problem Relation Age of Onset   Breast cancer Other 40   Alcohol abuse Father    Mental illness Father    Stroke Father    Diabetes Sister    Asthma Brother    Diabetes Brother    Diabetes Maternal Aunt    Heart disease Maternal Aunt    Diabetes Maternal Uncle    Early death Maternal Uncle    Breast cancer Cousin 54       mat cousin        12/13/2023   10:35 AM 09/21/2023    1:49 PM 09/21/2023    1:29 PM 08/16/2023    9:20 AM  GAD 7 : Generalized Anxiety Score  Nervous, Anxious, on Edge 0  0 0  Control/stop worrying 0 0 1 1  Worry too much - different things 0  1 1  Trouble relaxing 0  0 1  Restless 0  0 0  Easily  annoyed or irritable 0  1 0  Afraid - awful might happen 0  0 0  Total GAD 7 Score 0  3 3  Anxiety Difficulty Not difficult at all   Not difficult at all       12/13/2023   10:35 AM 10/23/2023   10:42 AM 09/21/2023    1:16 PM  Depression screen PHQ 2/9  Decreased Interest 0 0 0  Down, Depressed, Hopeless 0 0 0  PHQ - 2 Score 0 0 0    BP Readings from Last 3 Encounters:  12/13/23 118/68  09/04/23 122/72  08/28/23 132/70    Wt Readings from Last 3 Encounters:  12/13/23 151 lb (68.5 kg)  09/04/23 152 lb (68.9 kg)  08/30/23 154 lb (69.9 kg)    BP 118/68   Pulse 71   Ht 5' 2 (1.575 m)   Wt 151 lb (68.5 kg)   SpO2 95%   BMI 27.62 kg/m   Physical Exam Vitals and nursing note reviewed.  Constitutional:      General: She is not in acute distress.    Appearance: She is well-developed. She is not ill-appearing.  HENT:     Head: Normocephalic and atraumatic.  Cardiovascular:     Rate and Rhythm: Normal rate and  regular rhythm.  Pulmonary:     Effort: Pulmonary effort is normal. No respiratory distress.     Breath sounds: Normal breath sounds.  Abdominal:     General: Abdomen is flat. Bowel sounds are normal.     Palpations: Abdomen is soft.     Tenderness: There is abdominal tenderness in the left lower quadrant. There is no right CVA tenderness, left CVA tenderness, guarding or rebound.  Skin:    General: Skin is warm and dry.     Findings: No rash.  Neurological:     Mental Status: She is alert and oriented to person, place, and time.  Psychiatric:        Mood and Affect: Mood normal.        Behavior: Behavior normal.     Recent Labs     Component Value Date/Time   NA 138 07/18/2023 1539   NA 141 10/04/2012 0955   K 4.3 07/18/2023 1539   K 4.1 10/04/2012 0955   CL 101 07/18/2023 1539   CL 103 10/04/2012 0955   CO2 23 07/18/2023 1539   CO2 29 10/04/2012 0955   GLUCOSE 230 (H) 07/18/2023 1539   GLUCOSE 88 11/02/2022 1016   GLUCOSE 177 (H) 10/04/2012 0955   BUN 21 07/18/2023 1539   BUN 23 (H) 10/04/2012 0955   CREATININE 0.69 07/18/2023 1539   CREATININE 0.78 11/22/2012 0918   CALCIUM 10.7 (H) 07/18/2023 1539   CALCIUM 10.1 10/04/2012 0955   PROT 7.1 07/18/2023 1539   PROT 8.5 (H) 10/04/2012 0955   ALBUMIN 4.3 07/18/2023 1539   ALBUMIN 4.1 10/04/2012 0955   AST 21 07/18/2023 1539   AST 22 10/04/2012 0955   ALT 31 07/18/2023 1539   ALT 32 10/04/2012 0955   ALKPHOS 98 07/18/2023 1539   ALKPHOS 82 10/04/2012 0955   BILITOT 0.5 07/18/2023 1539   BILITOT 0.7 10/04/2012 0955   GFRNONAA 98 09/10/2015 1040   GFRNONAA >60 11/22/2012 0918   GFRAA 113 09/10/2015 1040   GFRAA >60 11/22/2012 0918    Lab Results  Component Value Date   WBC 5.4 07/18/2023   HGB 15.8 07/18/2023   HCT 48.7 (H) 07/18/2023   MCV 94 07/18/2023  PLT 274 07/18/2023   Lab Results  Component Value Date   HGBA1C 7.4 (A) 02/13/2023   HGBA1C 7.6 11/02/2022   HGBA1C 7.6 (A) 08/29/2022   Lab Results   Component Value Date   CHOL 199 07/18/2023   HDL 86 07/18/2023   LDLCALC 97 07/18/2023   TRIG 94 07/18/2023   CHOLHDL 2.3 07/18/2023   Lab Results  Component Value Date   TSH 1.510 07/18/2023    Assessment and Plan:  Diverticulitis of large intestine without perforation or abscess without bleeding -     Amoxicillin -Pot Clavulanate; Take 1 tablet by mouth 2 (two) times daily for 10 days.  Dispense: 20 tablet; Refill: 0  symptoms consistent with early diviticulitis Recommend bland diet, push fluids and treat with Augmentin . follow up if no improvement or worsening symptoms    No follow-ups on file.   Leita Adie, MD

## 2023-12-13 NOTE — Telephone Encounter (Signed)
 Noted  Pt had appt.  KP

## 2023-12-26 ENCOUNTER — Ambulatory Visit: Payer: Self-pay | Admitting: *Deleted

## 2023-12-26 ENCOUNTER — Other Ambulatory Visit: Payer: Self-pay

## 2023-12-26 MED ORDER — FLUCONAZOLE 150 MG PO TABS: 150.0000 mg | ORAL_TABLET | Freq: Once | ORAL | 0 refills | Status: AC

## 2023-12-26 NOTE — Telephone Encounter (Signed)
 Diflucan sent to pharmacy.

## 2023-12-26 NOTE — Telephone Encounter (Signed)
 Reason for Disposition  MODERATE-SEVERE itching (i.e., interferes with school, work, or sleep)    Pt requesting an rx be called in for vaginal itching.   I get this all the time.  Dr. Justus knows about it.    (I just triaged her husband for chest pain and referred him to the ED.   She is on the way to [facility] ED with him but wanted this request put in first).  Answer Assessment - Initial Assessment Questions 1. SYMPTOM: What's the main symptom you're concerned about? (e.g., pain, itching, dryness)     I'm having vaginal itching.   It started last week.     Will Dr. Justus send in the medicine for vaginal  [phone]. 2. LOCATION: Where is the  itching located? (e.g., inside/outside, left/right)     Vaginal itching.   No discharge.    No burning with urination.    3. ONSET: When did the  itching  start?     Last week.   I put a little alcohol down there for the itching. 4. PAIN: Is there any pain? If Yes, ask: How bad is it? (Scale: 1-10; mild, moderate, severe)     No burning 5. ITCHING: Is there any itching? If Yes, ask: How bad is it? (Scale: 1-10; mild, moderate, severe)     Yes 6. CAUSE: What do you think is causing the discharge? Have you had the same problem before? What happened then?     I get these all the time. 7. OTHER SYMPTOMS: Do you have any other symptoms? (e.g., fever, itching, vaginal bleeding, pain with urination, injury to genital area, vaginal foreign body)     No bleeding or abd pain. 8. PREGNANCY: Is there any chance you are pregnant? When was your last menstrual period?     N/A  Protocols used: Vaginal Symptoms-A-AH FYI Only or Action Required?: Action required by provider: clinical question for provider.  Patient was last seen in primary care on 12/13/2023 by Justus Leita DEL, MD.  Called Nurse Triage reporting Vaginal Discharge. Pt is having vaginal itching for the lat week.  No discharge.   I get this all the  time.   Will Dr. Justus call in the prescription for me to [facility]?   (Pt on the way to ED with husband for chest pain but she wanted this request submitted first).  Please call her on cell if Dr. Justus willing to send this in.  Her cell number is in the triage notes.   She did not want an appt at this time.  Symptoms began a week ago.  Interventions attempted: OTC medications: putting alcohol in vaginal area for itching.  Symptoms are: gradually worsening.  Triage Disposition: See Physician Within 24 Hours Pt did not want an appt.   Ask her and see if Dr. Justus will just call it in for me.   I get this all the time.   (Pt on way to [facility] ED with husband because he is having chest pain).   She could not remember the name of the medication.  Patient/caregiver understands and will follow disposition?: No   Requesting medication be called in.

## 2023-12-29 ENCOUNTER — Other Ambulatory Visit: Payer: Self-pay | Admitting: Internal Medicine

## 2024-01-01 NOTE — Telephone Encounter (Signed)
 Requested medication (s) are due for refill today: no  Requested medication (s) are on the active medication list: yes  Last refill:  12/26/23  Future visit scheduled: yes  Notes to clinic:  Medication not assigned to a protocol, review manually.      Requested Prescriptions  Pending Prescriptions Disp Refills   fluconazole  (DIFLUCAN ) 150 MG tablet [Pharmacy Med Name: Fluconazole  150 MG Oral Tablet] 1 tablet 0    Sig: TAKE ONE TABLET BY MOUTH AS A ONE-TIME DOSE     Off-Protocol Failed - 01/01/2024 12:02 PM      Failed - Medication not assigned to a protocol, review manually.      Passed - Valid encounter within last 12 months    Recent Outpatient Visits           2 weeks ago Diverticulitis of large intestine without perforation or abscess without bleeding   [place] [facility] at [facility], Leita DEL, MD   3 months ago Sprain of left wrist, initial encounter   [facility] at [facility], Selinda PARAS, MD   4 months ago Diarrhea of presumed infectious origin   [facility] at [facility], Leita DEL, MD   4 months ago Sacroiliac joint pain   [place] [facility] at [facility], Leita DEL, MD   5 months ago Annual physical exam   [facility] at [facility], Leita DEL, MD       Future Appointments             In 2 weeks Justus Leita DEL, MD [facility] at [facility], [phone] [place]

## 2024-01-03 ENCOUNTER — Other Ambulatory Visit: Payer: Self-pay | Admitting: Internal Medicine

## 2024-01-03 DIAGNOSIS — Z1231 Encounter for screening mammogram for malignant neoplasm of breast: Secondary | ICD-10-CM

## 2024-01-07 DIAGNOSIS — Z794 Long term (current) use of insulin: Secondary | ICD-10-CM | POA: Diagnosis not present

## 2024-01-07 DIAGNOSIS — M79675 Pain in left toe(s): Secondary | ICD-10-CM | POA: Diagnosis not present

## 2024-01-07 DIAGNOSIS — E1159 Type 2 diabetes mellitus with other circulatory complications: Secondary | ICD-10-CM | POA: Diagnosis not present

## 2024-01-07 DIAGNOSIS — E11649 Type 2 diabetes mellitus with hypoglycemia without coma: Secondary | ICD-10-CM | POA: Diagnosis not present

## 2024-01-07 DIAGNOSIS — E1169 Type 2 diabetes mellitus with other specified complication: Secondary | ICD-10-CM | POA: Diagnosis not present

## 2024-01-07 DIAGNOSIS — Z79899 Other long term (current) drug therapy: Secondary | ICD-10-CM | POA: Diagnosis not present

## 2024-01-07 DIAGNOSIS — M79674 Pain in right toe(s): Secondary | ICD-10-CM | POA: Diagnosis not present

## 2024-01-07 DIAGNOSIS — I152 Hypertension secondary to endocrine disorders: Secondary | ICD-10-CM | POA: Diagnosis not present

## 2024-01-07 DIAGNOSIS — E785 Hyperlipidemia, unspecified: Secondary | ICD-10-CM | POA: Diagnosis not present

## 2024-01-07 DIAGNOSIS — B351 Tinea unguium: Secondary | ICD-10-CM | POA: Diagnosis not present

## 2024-01-07 LAB — HEMOGLOBIN A1C: Hemoglobin A1C: 8.3

## 2024-01-17 ENCOUNTER — Ambulatory Visit: Admitting: Internal Medicine

## 2024-01-17 ENCOUNTER — Encounter: Payer: Self-pay | Admitting: Internal Medicine

## 2024-01-25 ENCOUNTER — Ambulatory Visit: Admitting: Family Medicine

## 2024-01-25 ENCOUNTER — Encounter: Payer: Self-pay | Admitting: Family Medicine

## 2024-01-25 ENCOUNTER — Ambulatory Visit (INDEPENDENT_AMBULATORY_CARE_PROVIDER_SITE_OTHER): Admitting: Family Medicine

## 2024-01-25 VITALS — BP 138/60 | HR 67 | Temp 97.4°F | Ht 62.0 in | Wt 151.0 lb

## 2024-01-25 DIAGNOSIS — M65341 Trigger finger, right ring finger: Secondary | ICD-10-CM | POA: Diagnosis not present

## 2024-01-25 MED ORDER — MELOXICAM 15 MG PO TABS: 15.0000 mg | ORAL_TABLET | Freq: Every day | ORAL | 0 refills | Status: AC

## 2024-01-25 NOTE — Progress Notes (Signed)
 Primary Care / Sports Medicine Office Visit  Patient Information:  Patient ID: Melinda Ford, female DOB: 16-May-1945 Age: 78 y.o. MRN: [id number]   Melinda Ford is a pleasant 78 y.o. female presenting with the following:  Chief Complaint  Patient presents with   Hand Pain    Trigger finger right ring finger. Patient noticed while driving she was not gripping the steering wheel as tight as she normal does. She would like to discuss other treatment before trying another injection.    Vitals:   01/25/24 1616  BP: 138/60  Pulse: 67  Temp: (!) 97.4 F (36.3 C)  SpO2: 98%   Vitals:   01/25/24 1616  Weight: 151 lb (68.5 kg)  Height: 5' 2 (1.575 m)   Body mass index is 27.62 kg/m.  No results found.   Discussed the use of AI scribe software for clinical note transcription with the patient, who gave verbal consent to proceed.   Independent interpretation of notes and tests performed by another provider:   None  Procedures performed:   None  Pertinent History, Exam, Impression, and Recommendations:   Problem List Items Addressed This Visit     Trigger ring finger of right hand - Primary   History of Present Illness Melinda Ford is a 78 year old female with a history of trigger finger who presents with recurrence of right ring finger trigger finger symptoms.  Right ring finger triggering symptoms - Recurrence of symptoms over the past 2-3 months (prior cortisone 04/23/2023) - Symptoms include soreness and occasional swelling - Difficulty gripping the steering wheel, initially noticed while driving - No complete locking of the finger - No recent use of oral medications - No recent use of meloxicam  or Voltaren  gel, though both are available at home  Functional impact and aggravating factors - Engages in frequent typing, which may contribute to symptom recurrence - Performs hand movements that may aggravate symptoms  Prior interventions for trigger finger -  Received a cortisone injection in January, which provided relief for more than three months - Previously used a brace last year, but does not currently have one  Physical Exam PALPATION: Tenderness to palpation at the right ring finger distal palmar crease. No overt locking. RANGE OF MOTION: Painful range of motion at the right ring finger.  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 Previous cortisone injection effective. Likely exacerbated by frequent typing. - Obtain and use trigger finger brace for 1-2 weeks. - Start daily meloxicam  with food for one week, then continue as needed. - Apply topical Voltaren  gel at least twice daily for one week, then continue as needed. - Begin and advance home exercises after one week, guided by symptoms. - Contact office if symptoms do not improve or worsen for cortisone injection discussion.      Relevant Medications   meloxicam  (MOBIC ) 15 MG tablet     Orders & Medications Medications:  Meds ordered this encounter  Medications   meloxicam  (MOBIC ) 15 MG tablet    Sig: Take 1 tablet (15 mg total) by mouth daily.    Dispense:  30 tablet    Refill:  0   No orders of the defined types were placed in this encounter.    No follow-ups on file.     Selinda JINNY Ku, MD, [facility]   Primary Care Sports Medicine Primary Care and Sports Medicine at [facility]

## 2024-01-25 NOTE — Assessment & Plan Note (Signed)
 History of Present Illness Melinda Ford is a 78 year old female with a history of trigger finger who presents with recurrence of right ring finger trigger finger symptoms.  Right ring finger triggering symptoms - Recurrence of symptoms over the past 2-3 months (prior cortisone 04/23/2023) - Symptoms include soreness and occasional swelling - Difficulty gripping the steering wheel, initially noticed while driving - No complete locking of the finger - No recent use of oral medications - No recent use of meloxicam  or Voltaren  gel, though both are available at home  Functional impact and aggravating factors - Engages in frequent typing, which may contribute to symptom recurrence - Performs hand movements that may aggravate symptoms  Prior interventions for trigger finger - Received a cortisone injection in January, which provided relief for more than three months - Previously used a brace last year, but does not currently have one  Physical Exam PALPATION: Tenderness to palpation at the right ring finger distal palmar crease. No overt locking. RANGE OF MOTION: Painful range of motion at the right ring finger.  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 Previous cortisone injection effective. Likely exacerbated by frequent typing. - Obtain and use trigger finger brace for 1-2 weeks. - Start daily meloxicam  with food for one week, then continue as needed. - Apply topical Voltaren  gel at least twice daily for one week, then continue as needed. - Begin and advance home exercises after one week, guided by symptoms. - Contact office if symptoms do not improve or worsen for cortisone injection discussion.

## 2024-01-25 NOTE — Patient Instructions (Addendum)
 VISIT SUMMARY:  Today, we discussed the recurrence of your right ring finger trigger finger symptoms. We reviewed your symptoms, their impact on your daily activities, and previous treatments.  YOUR PLAN:  TRIGGER FINGER, RIGHT RING FINGER: You have chronic trigger finger with inflammation of the tendon sheath in your right ring finger. This is likely made worse by frequent typing. -Obtain and use a trigger finger brace for 1-2 weeks. -Start taking meloxicam  daily with food for one week, then continue as needed. -Apply Voltaren  gel to the affected area at least twice daily for one week, then continue as needed. -Begin and advance home exercises after one week, based on how you feel. -Contact our office if your symptoms do not improve or if they worsen to discuss the possibility of another cortisone injection.

## 2024-02-13 ENCOUNTER — Other Ambulatory Visit: Payer: Self-pay | Admitting: Pharmacist

## 2024-02-14 ENCOUNTER — Ambulatory Visit
Admission: RE | Admit: 2024-02-14 | Discharge: 2024-02-14 | Disposition: A | Discharge status: Home / Self Care | Source: Ambulatory Visit | Attending: Internal Medicine | Admitting: Internal Medicine

## 2024-02-14 DIAGNOSIS — Z1231 Encounter for screening mammogram for malignant neoplasm of breast: Secondary | ICD-10-CM | POA: Diagnosis not present

## 2024-02-20 ENCOUNTER — Other Ambulatory Visit: Payer: Self-pay | Admitting: Pharmacist

## 2024-02-20 DIAGNOSIS — E118 Type 2 diabetes mellitus with unspecified complications: Secondary | ICD-10-CM

## 2024-02-20 NOTE — Patient Instructions (Signed)
 Goals Addressed             This Visit's Progress    Pharmacy Goals       The goal A1c is less than 7%. This is the best way to reduce the risk of the long term complications of diabetes, including heart disease, kidney disease, eye disease, strokes, and nerve damage. An A1c of less than 7% corresponds with fasting sugars less than 130 and 2 hour after meal sugars less than 180.   Our goal bad cholesterol, or LDL, is less than 70 . This is why it is important to continue taking your pravastatin .  Sharyle Sia, PharmD, [facility] [phone]

## 2024-02-20 NOTE — Progress Notes (Signed)
 02/20/2024 Name: Melinda Ford MRN: [id number] DOB: 1945-08-29  Chief Complaint  Patient presents with   Medication Management   Medication Adherence    Melinda Ford is a 78 y.o. year old female who presented for a telephone visit.   They were referred to the pharmacist by a quality report for assistance in managing diabetes.      Subjective:   Care Team: Primary Care Provider: Justus Leita DEL, Ford; Next Scheduled Visit: 03/12/2024 Endocrinologist Melinda Debby Quivers, Ford; Next Scheduled Visit: 05/23/2024 Dietitian: Melinda Ford; Next Scheduled Visit: 03/06/2024 Podiatrist: Melinda Ford; Next Scheduled Visit: 04/09/2024  Medication Access/Adherence  Current Pharmacy:  [facility] [address], [place] - [address] [place] [address] Phone: [phone] Fax: [phone]  [facility] [place], [place] - [address] [place] [address] [place] [address] Phone: [phone] Fax: [phone]  [id number] [place], [place] - [address] AT [facility] [address] [place] [address] [place] [address] Phone: [phone] Fax: [phone]  [facility] [place], [place] - [address] [place] [address] Phone: [phone] Fax: [phone]  [facility] [address], [place] - [address] [place] [address] Phone: [phone] Fax: [phone]   Patient reports affordability concerns with their medications: No  Patient reports access/transportation concerns to their pharmacy: No  Patient reports adherence concerns with their medications:  No     Note at latest Office Visit with Dr. Cherilyn on 09/21/2023, provider advised patient: Start back at 0.5 mg once a week. After a month or two, we can increase to 1mg  once a week if you want. Message me at that time if you want to go up and I'll send in a new script. We  won't go back to 2mg  however as that is where your constipation started  Take the glipizide, but take the whole pill with your first meal every day. If sugars are still mostly in the 200s in a week, increase the insulin  to 25 units daily    Diabetes:   Patient followed by [facility]   Current medications:  - Ozempic  0.5 mg weekly on Saturdays - restarted ~01/11/2024 Reports tolerating current dose.  - Lantus  15 units daily - started ~1 week ago - glipizide 5 mg daily ~30 minutes before lunch (biggest meal of the day)   Medications tried in the past: Humalog  with V-go; glipizide; Ozempic  2 mg dose - constipation   Reports using Freestyle Libre 3 Plus. Unable to connect via Libreview (uses reader device) - Reports morning blood sugar before breakfast was ~170  Reports recently felt kind of strange with a reading of 86 - Reports carries glucose tablets with her in case needed for hypoglycemia   Current physical activity: Goes to gym twice weekly   Statin therapy: pravastatin  20 mg three times weekly - Reports does not take daily as has concerns about statins    Objective:  Lab Results  Component Value Date   HGBA1C 7.4 (A) 02/13/2023  Per shared record from [facility], A1C was 8.3% on 01/07/2024  Lab Results  Component Value Date   CREATININE 0.69 07/18/2023   BUN 21 07/18/2023   NA 138 07/18/2023   K 4.3 07/18/2023   CL 101 07/18/2023   CO2 23 07/18/2023  Lab Results  Component Value Date   CHOL 199 07/18/2023   HDL 86 07/18/2023   LDLCALC 97 07/18/2023   TRIG 94 07/18/2023   CHOLHDL 2.3 07/18/2023    Medications Reviewed Today     Reviewed by Melinda Ford (Pharmacist) on 02/20/24 at 1353  Med List Status: <None>   Medication Order Taking? Sig Documenting Provider Last Dose Status Informant  acetaminophen  (TYLENOL ) 500 MG tablet [id number]  Take 1,000 mg by mouth 2 (two) times daily. As needed Provider,  Historical, Ford  Active   amLODipine  (NORVASC ) 10 MG tablet [id number]  Take 1 tablet (10 mg total) by mouth 2 (two) times daily.  Patient taking differently: Take 10 mg by mouth daily.   Melinda Ford  Active   Apoaequorin (PREVAGEN PO) [id number]  Take by mouth. Provider, Historical, Ford  Active   Calcium Carbonate-Vit D-Min (CALCIUM 1200 PO) [id number]  Take 2 each by mouth daily. Provider, Historical, Ford  Active   Cholecalciferol (VITAMIN D ) 2000 UNITS CAPS [id number]  Take 1 capsule (2,000 Units total) by mouth daily. Melinda Ford  Active   clotrimazole -betamethasone  (LOTRISONE ) cream [id number]  Apply 1 Application topically daily. To feet and rash under breasts Melinda Ford  Active   Continuous Glucose Receiver (FREESTYLE LIBRE 3 READER) DEVI [id number]  Use to monitor glucose levels Melinda Ford  Active   Continuous Glucose Sensor (FREESTYLE LIBRE 3 PLUS SENSOR) MISC [id number]  1 each by Does not apply route continuous. Change every 15 days. Melinda Ford  Active   Continuous Glucose Sensor (FREESTYLE LIBRE 3 SENSOR) [place] [id number]  Place 1 sensor on the skin every 14 days. Use to check glucose continuously Melinda Ford  Active   Glucagon  (GVOKE HYPOPEN  1-PACK) 1 MG/0.2ML SOAJ [id number]  Inject 1 mg into the skin as needed (low blood sugar with impaired consciousness). Melinda Ford  Active            Med Note Melinda Ford [place] Feb 22, 2023  9:35 AM) Has not used this yet   glucose blood Melinda Ford) test strip [id number]  USE 1 STRIP TO CHECK GLUCOSE 4 TIMES DAILY Melinda Ford  Active   Insulin  Pen Needle (PEN NEEDLES) 32G X 5 MM MISC [id number]  Please Melinda Ford  Active   LANTUS  SOLOSTAR 100 UNIT/ML Solostar Pen [id number]  Inject 15 Units into the skin daily. Provider, Historical, Ford  Active   meloxicam  (MOBIC ) 15 MG tablet [id number]  Take 1 tablet (15 mg total) by mouth daily. Alvia Selinda PARAS, Ford  Active   NON FORMULARY [id number]   CPAP @@ 9 cm H2O nightly Provider, Historical, Ford  Active   OZEMPIC , 2 MG/DOSE, 8 MG/3ML SOPN [id number]  Inject 2 mg into the skin once a week. Provider, Historical, Ford  Active   pravastatin  (PRAVACHOL ) 20 MG tablet [id number] Yes Take 1 tablet by mouth once daily  Patient taking differently: Take 20 mg by mouth 3 (three) times a week.   Melinda Ford  Active   triamcinolone  cream (KENALOG ) 0.5 % [id number]  Apply 1 Application topically 3 (three) times daily. To rash on neck Melinda Ford  Active            Med Note CATHY, PAULA   Thu Aug 30, 2023  2:05 PM) prn              Assessment/Plan:   Provide counseling  on adherence to statin/ASCVD risk reduction. Patient willing to consider increasing statin frequency if LDL remaining elevated with next lab work    Diabetes: - Currently uncontrolled based off of latest A1C result - Reviewed goal A1c, goal fasting, and goal 2 hour post prandial glucose - Reviewed importance of having regular well-balanced meals and snacks throughout the day, while controlling carbohydrate portion sizes  Advise patient against skipping meals - Remind patient to take her glipizide dose 30 minutes before the meal, rather than during the meal - Have counseled patient on s/s of low blood sugar and how to treat lows - Recommend to check glucose, keep log of results and have this record to review at upcoming medical appointments. Patient to contact provider office sooner if needed for readings outside of established parameters or symptoms      Follow Up Plan: Clinical Pharmacist will follow up with patient by telephone on 04/23/2024 at 1:30 PM    Sharyle Sia, PharmD, [facility] [phone]

## 2024-03-05 ENCOUNTER — Encounter: Attending: Internal Medicine | Admitting: Dietician

## 2024-03-05 DIAGNOSIS — E118 Type 2 diabetes mellitus with unspecified complications: Secondary | ICD-10-CM | POA: Diagnosis present

## 2024-03-05 NOTE — Patient Instructions (Addendum)
 If having baked beans or potato salad, have about a 1/2 cup serving size! Choose either   Try Halo Top ice cream!!  Use your ADA Handout to incorporate a day or two of exercises to build stability and strength to prepare for your trip in March!  Look into the [facility] free PT clinic for your hip pain! https://[email or web address]/  [phone]

## 2024-03-05 NOTE — Progress Notes (Signed)
 Diabetes Self-Management Education  Visit Type: Follow-up  Appt. Start Time: 1535 Appt. End Time: 1615  03/05/2024  [name], identified by name and date of birth, is a 78 y.o. female with a diagnosis of Diabetes:  .   ASSESSMENT Pt reports previously d/c Ozempic  @2  mg due to severe constipation, was restarted on Ozempic  @0 .5 mg weekly after last A1c draw and started on Glipizide @5  mg. Pt reports continuing Lantus  @15u . Pt brought FreeStyle Libre3 reader to visit: CGM Results from download:   Average glucose:   181 mg/dL for 90 days  Time in range (70-180 mg/dL):   55 %   (Goal >29%)  Time High (181-250 mg/dL):   45 %   (Goal < 74%)  Time Low (54-69 mg/dL):   0 %   (Goal <5%)  CGM history shows significant improvement in TIR (79% over last 30 days) since starting Glipizide and Ozempic . Pt reports their R hip has been bothering them more recently, states it seems more painful when it's cold/rainy. Pt states they have been walking less since the weather has gotten colder, but has been doing aerobics on Monday and Wednesday for 45 minutes.   Diabetes Self-Management Education - 03/05/24 1600       Visit Information   Visit Type Follow-up      Pre-Education Assessment   Patient understands the diabetes disease and treatment process. Comprehends key points    Patient understands incorporating nutritional management into lifestyle. Needs Review    Patient undertands incorporating physical activity into lifestyle. Comprehends key points    Patient understands using medications safely. Comprehends key points    Patient understands monitoring blood glucose, interpreting and using results Comprehends key points    Patient understands prevention, detection, and treatment of acute complications. Comprehends key points    Patient understands prevention, detection, and treatment of chronic complications. Compreheands key points    Patient understands how to develop strategies to address  psychosocial issues. Comprehends key points    Patient understands how to develop strategies to promote health/change behavior. Comprehends key points      Complications   Last HgB A1C per patient/outside source 8.3 %   01/07/2024   How often do you check your blood sugar? > 4 times/day    Fasting Blood glucose range (mg/dL) 869-820;29-870    Postprandial Blood glucose range (mg/dL) 819-799;>799;869-820    Number of hyperglycemic episodes ( >200mg /dL): Occasional    Can you tell when your blood sugar is high? No      Dietary Intake   Breakfast None    Lunch [employer]'s steak breakfast sandwich, water     Dinner Chicken, rice, broccoli (chinese takeout)    Beverage(s) Water , Twist      Activity / Exercise   Activity / Exercise Type ADL's;Moderate (swimming / aerobic walking)    How many days per week do you exercise? 2    How many minutes per day do you exercise? 45    Total minutes per week of exercise 90      Patient Education   Healthy Eating Meal options for control of blood glucose level and chronic complications.;Food label reading, portion sizes and measuring food.    Being Active Helped patient identify appropriate exercises in relation to his/her diabetes, diabetes complications and other health issue.    Medications Reviewed patients medication for diabetes, action, purpose, timing of dose and side effects.;Taught/reviewed insulin /injectables, injection, site rotation, insulin /injectables storage and needle disposal.    Monitoring Taught/evaluated CGM (  comment)   TIR, Daily graphs   Chronic complications Relationship between chronic complications and blood glucose control      Individualized Goals (developed by patient)   Nutrition Follow meal plan discussed    Physical Activity Exercise 3-5 times per week    Medications take my medication as prescribed    Monitoring  Consistenly use CGM    Problem Solving Eating Pattern    Reducing Risk examine blood glucose patterns       Patient Self-Evaluation of Goals - Patient rates self as meeting previously set goals (% of time)   Nutrition 50 - 75 % (half of the time)    Physical Activity 25 - 50% (sometimes)    Medications >75% (most of the time)    Monitoring >75% (most of the time)    Problem Solving and behavior change strategies  25 - 50% (sometimes)    Reducing Risk (treating acute and chronic complications) 25 - 50% (sometimes)    Health Coping 25 - 50% (sometimes)      Post-Education Assessment   Patient understands the diabetes disease and treatment process. Comprehends key points    Patient understands incorporating nutritional management into lifestyle. Comprehends key points    Patient undertands incorporating physical activity into lifestyle. Comprehends key points    Patient understands using medications safely. Comphrehends key points    Patient understands monitoring blood glucose, interpreting and using results Comprehends key points    Patient understands prevention, detection, and treatment of acute complications. Comprehends key points    Patient understands prevention, detection, and treatment of chronic complications. Comprehends key points    Patient understands how to develop strategies to address psychosocial issues. Comprehends key points    Patient understands how to develop strategies to promote health/change behavior. Comprehends key points      Outcomes   Expected Outcomes Demonstrated interest in learning. Expect positive outcomes    Future DMSE 3-4 months    Program Status Not Completed      Subsequent Visit   Since your last visit have you continued or begun to take your medications as prescribed? Yes    Since your last visit have you had your blood pressure checked? Yes    Is your most recent blood pressure lower, unchanged, or higher since your last visit? Unchanged    Since your last visit have you experienced any weight changes? No change    Since your last visit, are you  checking your blood glucose at least once a day? Yes          Individualized Plan for Diabetes Self-Management Training:   Learning Objective:  Patient will have a greater understanding of diabetes self-management. Patient education plan is to attend individual and/or group sessions per assessed needs and concerns.   Plan:   Patient Instructions  If having baked beans or potato salad, have about a 1/2 cup serving size! Choose either   Try Halo Top ice cream!!  Use your ADA Handout to incorporate a day or two of exercises to build stability and strength to prepare for your trip in March!  Look into the [place] free PT clinic for your hip pain! https://[email or web address]/  [phone]  Expected Outcomes:  Demonstrated interest in learning. Expect positive outcomes  Education material provided: ADA Seated Exercises  If problems or questions, patient to contact team via:  Phone and Email  Future DSME appointment: 3-4 months

## 2024-03-06 ENCOUNTER — Ambulatory Visit: Admitting: Dietician

## 2024-03-12 ENCOUNTER — Ambulatory Visit: Admitting: Internal Medicine

## 2024-03-12 ENCOUNTER — Encounter: Payer: Self-pay | Admitting: Internal Medicine

## 2024-03-13 ENCOUNTER — Telehealth: Payer: Self-pay

## 2024-03-13 ENCOUNTER — Encounter: Payer: Self-pay | Admitting: Internal Medicine

## 2024-03-13 ENCOUNTER — Ambulatory Visit (INDEPENDENT_AMBULATORY_CARE_PROVIDER_SITE_OTHER): Admitting: Internal Medicine

## 2024-03-13 VITALS — BP 122/78 | HR 67 | Ht 62.0 in | Wt 156.0 lb

## 2024-03-13 DIAGNOSIS — M4722 Other spondylosis with radiculopathy, cervical region: Secondary | ICD-10-CM | POA: Diagnosis not present

## 2024-03-13 DIAGNOSIS — E118 Type 2 diabetes mellitus with unspecified complications: Secondary | ICD-10-CM

## 2024-03-13 DIAGNOSIS — Z7984 Long term (current) use of oral hypoglycemic drugs: Secondary | ICD-10-CM | POA: Diagnosis not present

## 2024-03-13 DIAGNOSIS — I10 Essential (primary) hypertension: Secondary | ICD-10-CM | POA: Diagnosis not present

## 2024-03-13 MED ORDER — AMLODIPINE BESYLATE 10 MG PO TABS: 10.0000 mg | ORAL_TABLET | Freq: Every day | ORAL | 3 refills | Status: AC

## 2024-03-13 NOTE — Assessment & Plan Note (Signed)
 Well controlled blood pressure today. Current regimen is amlodipine  once a day. No medication side effects noted.

## 2024-03-13 NOTE — Assessment & Plan Note (Signed)
 Chronic neck and shoulder pain - taking tylenol  and gabapentin 

## 2024-03-13 NOTE — Telephone Encounter (Signed)
 Patient is scheduled to see you Tuesday for a procedure for finger injections. Is there anything she can take before the procedure because the pain was so bad last time? Please advise and send back to clinical pool with response (just in case I'm out of office)   Thank you!

## 2024-03-13 NOTE — Progress Notes (Signed)
 Date:  03/13/2024   Name:  Melinda Ford   DOB:  [birth year]   MRN:  [id number]   Chief Complaint: Hypertension  Hypertension This is a chronic problem. The problem is controlled. Pertinent negatives include no chest pain, headaches, palpitations or shortness of breath. Past treatments include calcium channel blockers. The current treatment provides significant improvement.    Review of Systems  Constitutional:  Negative for fatigue and unexpected weight change.  HENT:  Negative for trouble swallowing.   Eyes:  Negative for visual disturbance.  Respiratory:  Negative for cough, chest tightness, shortness of breath and wheezing.   Cardiovascular:  Negative for chest pain, palpitations and leg swelling.  Gastrointestinal:  Negative for abdominal pain, constipation and diarrhea.  Musculoskeletal:  Negative for arthralgias and myalgias.  Neurological:  Negative for dizziness, weakness, light-headedness and headaches.     Lab Results  Component Value Date   NA 138 07/18/2023   K 4.3 07/18/2023   CO2 23 07/18/2023   GLUCOSE 230 (H) 07/18/2023   BUN 21 07/18/2023   CREATININE 0.69 07/18/2023   CALCIUM 10.7 (H) 07/18/2023   EGFR 89 07/18/2023   GFRNONAA 98 09/10/2015   Lab Results  Component Value Date   CHOL 199 07/18/2023   HDL 86 07/18/2023   LDLCALC 97 07/18/2023   TRIG 94 07/18/2023   CHOLHDL 2.3 07/18/2023   Lab Results  Component Value Date   TSH 1.510 07/18/2023   Lab Results  Component Value Date   HGBA1C 8.3 01/07/2024   Lab Results  Component Value Date   WBC 5.4 07/18/2023   HGB 15.8 07/18/2023   HCT 48.7 (H) 07/18/2023   MCV 94 07/18/2023   PLT 274 07/18/2023   Lab Results  Component Value Date   ALT 31 07/18/2023   AST 21 07/18/2023   ALKPHOS 98 07/18/2023   BILITOT 0.5 07/18/2023   Lab Results  Component Value Date   VD25OH 34.0 12/28/2020     Patient Active Problem List   Diagnosis Date Noted   Tendinitis of left wrist 09/04/2023    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 08/29/2023   Palpitations 03/22/2023   Lumbar radiculopathy 02/22/2023   Trigger ring finger of right hand 07/23/2022   Cervical radicular pain (left C5,6) 03/17/2021   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/2021   Chronic pain syndrome 03/17/2021   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/2021   Cervical paraspinal muscle spasm 02/10/2021   Gastroesophageal reflux disease 12/28/2020   History of colonic polyps    Polyp of sigmoid colon    Abdominal pain, epigastric    Patient noncompliant with statin medication 09/12/2019   Type II diabetes mellitus with complication (HCC) 06/04/2019   Hyperlipidemia associated with type 2 diabetes mellitus (HCC) 05/17/2018   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 02/22/2018   Chronic midline thoracic back pain 02/22/2018   Tinea versicolor 09/20/2016   OSA (obstructive sleep apnea) 08/23/2016   Allergic rhinitis 07/13/2016   Overweight (BMI 25.0-29.9) 10/01/2015   Vitamin D  deficiency 09/18/2014   Onychomycosis 09/16/2014   Degenerative disc disease, lumbar 09/16/2014   Benign essential HTN 10/10/2010   Chronic right-sided low back pain with right-sided sciatica 10/10/2010   Osteoporosis 10/10/2010    Allergies[1]  Past Surgical History:  Procedure Laterality Date   ABDOMINAL HYSTERECTOMY     CATARACT EXTRACTION Right 2020   COLONOSCOPY WITH PROPOFOL  N/A 10/10/2019   Procedure: COLONOSCOPY WITH PROPOFOL ;  Surgeon: Jinny,  Darren, MD;  Location: [facility];  Service: Endoscopy;  Laterality: N/A;   ESOPHAGOGASTRODUODENOSCOPY (EGD) WITH PROPOFOL  N/A 10/10/2019   Procedure: ESOPHAGOGASTRODUODENOSCOPY (EGD) WITH PROPOFOL ;  Surgeon: Jinny Carmine, MD;  Location: [facility];  Service: Endoscopy;  Laterality: N/A;  Diabetic - insulin  pump and oral meds sleep apnea   POLYPECTOMY  10/10/2019   Procedure: POLYPECTOMY;  Surgeon: Jinny Carmine,  MD;  Location[facility];  Service: Endoscopy;;    Social History[2]   Medication list has been reviewed and updated.  Active Medications[3]     03/13/2024    9:07 AM 12/13/2023   10:35 AM 09/21/2023    1:49 PM 09/21/2023    1:29 PM  GAD 7 : Generalized Anxiety Score  Nervous, Anxious, on Edge 0 0  0  Control/stop worrying 0 0 0 1  Worry too much - different things 0 0  1  Trouble relaxing 0 0  0  Restless 0 0  0  Easily annoyed or irritable 0 0  1  Afraid - awful might happen 0 0  0  Total GAD 7 Score 0 0  3  Anxiety Difficulty Not difficult at all Not difficult at all         03/13/2024    9:07 AM 12/13/2023   10:35 AM 10/23/2023   10:42 AM  Depression screen PHQ 2/9  Decreased Interest 0 0 0  Down, Depressed, Hopeless 0 0 0  PHQ - 2 Score 0 0 0  Altered sleeping 0    Tired, decreased energy 0    Change in appetite 0    Feeling bad or failure about yourself  0    Trouble concentrating 0    Moving slowly or fidgety/restless 0    Suicidal thoughts 0    PHQ-9 Score 0    Difficult doing work/chores Not difficult at all      BP Readings from Last 3 Encounters:  03/13/24 122/78  01/25/24 138/60  12/13/23 118/68    Physical Exam Vitals and nursing note reviewed.  Constitutional:      General: She is not in acute distress.    Appearance: Normal appearance. She is well-developed.  HENT:     Head: Normocephalic and atraumatic.  Cardiovascular:     Rate and Rhythm: Normal rate and regular rhythm.     Heart sounds: No murmur heard. Pulmonary:     Effort: Pulmonary effort is normal. No respiratory distress.     Breath sounds: No wheezing or rhonchi.  Musculoskeletal:     Cervical back: Normal range of motion.  Skin:    General: Skin is warm and dry.     Capillary Refill: Capillary refill takes less than 2 seconds.     Findings: No rash.  Neurological:     Mental Status: She is alert and oriented to person, place, and time.  Psychiatric:         Mood and Affect: Mood normal.        Behavior: Behavior normal.     Wt Readings from Last 3 Encounters:  03/13/24 156 lb (70.8 kg)  01/25/24 151 lb (68.5 kg)  12/13/23 151 lb (68.5 kg)    BP 122/78   Pulse 67   Ht 5' 2 (1.575 m)   Wt 156 lb (70.8 kg)   SpO2 99%   BMI 28.53 kg/m   Assessment and Plan:  Problem List Items Addressed This Visit       Unprioritized   Benign essential  HTN - Primary (Chronic)   Well controlled blood pressure today. Current regimen is amlodipine  once a day. No medication side effects noted.        Relevant Medications   pravastatin  (PRAVACHOL ) 20 MG tablet   amLODipine  (NORVASC ) 10 MG tablet   Type II diabetes mellitus with complication (HCC) (Chronic)   She recently stopped Ozempic  2 mg due to constipation. Her A1C went up to 8.3 so she restarted Ozempic  at 0.5 mg. And continues insulin  and glimepiride.      Relevant Medications   pravastatin  (PRAVACHOL ) 20 MG tablet   Cervical spondylosis with radiculopathy   Chronic neck and shoulder pain - taking tylenol  and gabapentin       Other Visit Diagnoses       Long term current use of oral hypoglycemic drug           No follow-ups on file.    Leita HILARIO Adie, MD [facility] [place]           [1]  Allergies Allergen Reactions   Ace Inhibitors Cough   Wheat Hives   Gramineae Pollens   [2]  Social History Tobacco Use   Smoking status: Never    Passive exposure: Past   Smokeless tobacco: Never  Vaping Use   Vaping status: Never Used  Substance Use Topics   Alcohol use: Not Currently    Alcohol/week: 1.0 standard drink of alcohol    Types: 1 Standard drinks or equivalent per week   Drug use: Never  [3]  Current Meds  Medication Sig   acetaminophen  (TYLENOL ) 500 MG tablet Take 1,000 mg by mouth 2 (two) times daily. As needed   Apoaequorin (PREVAGEN PO) Take by mouth.   Calcium Carbonate-Vit D-Min (CALCIUM 1200 PO) Take 2 each  by mouth daily.   Cholecalciferol (VITAMIN D ) 2000 UNITS CAPS Take 1 capsule (2,000 Units total) by mouth daily.   clotrimazole -betamethasone  (LOTRISONE ) cream Apply 1 Application topically daily. To feet and rash under breasts   Continuous Glucose Sensor (FREESTYLE LIBRE 3 PLUS SENSOR) MISC 1 each by Does not apply route continuous. Change every 15 days.   Continuous Glucose Sensor (FREESTYLE LIBRE 3 SENSOR) MISC Place 1 sensor on the skin every 14 days. Use to check glucose continuously   glipiZIDE (GLUCOTROL) 5 MG tablet Take 5 mg by mouth daily before lunch.   Glucagon  (GVOKE HYPOPEN  1-PACK) 1 MG/0.2ML SOAJ Inject 1 mg into the skin as needed (low blood sugar with impaired consciousness).   glucose blood (ONETOUCH VERIO) test strip USE 1 STRIP TO CHECK GLUCOSE 4 TIMES DAILY   Insulin  Pen Needle (PEN NEEDLES) 32G X 5 MM MISC Please   LANTUS  SOLOSTAR 100 UNIT/ML Solostar Pen Inject 15 Units into the skin daily.   meloxicam  (MOBIC ) 15 MG tablet Take 1 tablet (15 mg total) by mouth daily.   Multiple Vitamin (MULTI-VITAMIN) tablet Take 1 tablet by mouth.   NON FORMULARY CPAP @@ 9 cm H2O nightly   OZEMPIC , 0.25 OR 0.5 MG/DOSE, 2 MG/3ML SOPN Inject 0.5 mg into the skin once a week.   pravastatin  (PRAVACHOL ) 20 MG tablet Take 20 mg by mouth 3 (three) times a week.   terbinafine (LAMISIL) 250 MG tablet Take 250 mg by mouth daily.   triamcinolone  cream (KENALOG ) 0.5 % Apply 1 Application topically 3 (three) times daily. To rash on neck   [DISCONTINUED] amLODipine  (NORVASC ) 10 MG tablet Take 1 tablet (10 mg total) by mouth 2 (two) times  daily. (Patient taking differently: Take 10 mg by mouth daily.)   [DISCONTINUED] Continuous Glucose Receiver (FREESTYLE LIBRE 3 READER) DEVI Use to monitor glucose levels   [DISCONTINUED] pravastatin  (PRAVACHOL ) 20 MG tablet Take 1 tablet by mouth once daily (Patient taking differently: Take 20 mg by mouth 3 (three) times a week.)

## 2024-03-13 NOTE — Assessment & Plan Note (Signed)
 She recently stopped Ozempic  2 mg due to constipation. Her A1C went up to 8.3 so she restarted Ozempic  at 0.5 mg. And continues insulin  and glimepiride.

## 2024-03-17 ENCOUNTER — Telehealth: Payer: Self-pay

## 2024-03-17 ENCOUNTER — Other Ambulatory Visit: Payer: Self-pay | Admitting: Family Medicine

## 2024-03-17 MED ORDER — OXYCODONE-ACETAMINOPHEN 2.5-325 MG PO TABS: ORAL_TABLET | ORAL | 0 refills | Status: DC

## 2024-03-17 NOTE — Telephone Encounter (Signed)
 Called patient to inform her Dr. Alvia sent her medications in for procedure tomorrow. No answer. No VM set up. E2C2 called while making encounter informed patient RX is at Pharmacy waiting to be picked up. She will pick up tomorrow.  JM

## 2024-03-17 NOTE — Telephone Encounter (Signed)
 Copied from CRM #[id number]. Topic: Appointments - Scheduling Inquiry for Clinic >> Mar 17, 2024  8:14 AM Melinda Ford wrote: Reason for CRM: Patient has appointment for cortisone injection tomorrow at 3:20 pm and would like to know if there is anything she can take beforehand. Pain was intense the last time and she wants to get ahead of it this time. Patient CB# [phone]. Patient prefers to be called on her cell as she does not always see mychart messages.

## 2024-03-17 NOTE — Telephone Encounter (Signed)
 Please send pain medication for procedure to [facility] [place]

## 2024-03-18 ENCOUNTER — Ambulatory Visit: Admitting: Family Medicine

## 2024-03-18 ENCOUNTER — Encounter: Payer: Self-pay | Admitting: Family Medicine

## 2024-03-18 ENCOUNTER — Other Ambulatory Visit: Payer: Self-pay | Admitting: Family Medicine

## 2024-03-18 VITALS — BP 130/60 | HR 61 | Ht 62.0 in

## 2024-03-18 DIAGNOSIS — Z758 Other problems related to medical facilities and other health care: Secondary | ICD-10-CM

## 2024-03-18 MED ORDER — OXYCODONE-ACETAMINOPHEN 5-325 MG PO TABS: ORAL_TABLET | ORAL | 0 refills | Status: AC

## 2024-03-18 NOTE — Telephone Encounter (Signed)
 Copied from CRM #[id number]. Topic: Clinical - Prescription Issue >> Mar 18, 2024 11:10 AM Avram MATSU wrote: Reason for CRM: patient medication oxycodone -acetaminophen  (PERCOCET) 2.5-325 MG tablet [[id number]] can't be filled. Leita stated they provider can send in 5-325mg  and can take 1/2 after 45 then repeat in 4 hours if need. Please [phone]   [facility] [address] [place] - [address] [place] [address] Phone: [phone] Fax: [phone]    Per [facility] they cannot get the 2.5-325 in time for patient to pick up prior to procedure. Please send in 5-325mg , 1 tab, Take 1/2 tablet PO 45 minutes prior to planned procedure. Can repeat in 4 hours as-needed.   Thank you!

## 2024-03-18 NOTE — Progress Notes (Signed)
 RESCHEDULED

## 2024-03-19 ENCOUNTER — Other Ambulatory Visit (INDEPENDENT_AMBULATORY_CARE_PROVIDER_SITE_OTHER): Payer: Self-pay | Admitting: Radiology

## 2024-03-19 ENCOUNTER — Ambulatory Visit: Admitting: Family Medicine

## 2024-03-19 ENCOUNTER — Encounter: Payer: Self-pay | Admitting: Family Medicine

## 2024-03-19 VITALS — BP 116/60 | HR 72 | Ht 62.0 in

## 2024-03-19 DIAGNOSIS — M65341 Trigger finger, right ring finger: Secondary | ICD-10-CM

## 2024-03-19 MED ORDER — METHYLPREDNISOLONE ACETATE 40 MG/ML IJ SUSP
20.0000 mg | Freq: Once | INTRAMUSCULAR | Status: AC
  Administered 2024-03-19: 12:00:00 20 mg via INTRA_ARTICULAR

## 2024-03-26 NOTE — Assessment & Plan Note (Signed)
 History of Present Illness Melinda Ford is a 78 year old female with type 2 diabetes who presents for management of recurrent right third finger trigger finger following corticosteroid injection.  Right third finger pain and triggering - Recurrent pain and soreness localized to the right third finger following corticosteroid injection - Symptoms returned after a period of improvement post-injection - Pain described as mild to moderate - Increased soreness noted yesterday with partial improvement since then - Able to perform hand exercises including making a fist, spreading the hand, and squeezing - History of significant manual activity with the right hand - Symptoms have recurred after prior improvement - No shoulder symptoms - Able to position the hand for procedures  Analgesic use and side effects - Prescribed oxycodone  2.5 mg for analgesia - Initially takes half a tablet with option to redose after four hours - Mild sedation after oxycodone  use - No dizziness or nausea - Manages opioid-induced constipation with laxatives as needed - Maintains regular bowel movements  Impact of diabetes and aging on hand symptoms - Expresses concern regarding the impact of aging and diabetes on hand symptoms - Aware of association between diabetes and increased inflammation  Physical Exam STRENGTH: Right hand soreness at the third digit upon palpation. Shoulder examination reveals no abnormalities.  Assessment and Plan Trigger finger, right hand Recurrent right hand trigger finger likely due to frequent hand use and diabetes, causing inflammation and symptoms. - Administered corticosteroid injection with local anesthetic. - Recommended ice application post-procedure to reduce pain and inflammation. - Advised acetaminophen  for mild pain; prescribed oxycodone  2.5 mg for severe pain, with redosing after four hours if needed. - Discussed constipation risk with oxycodone ; advised regular bowel  movements and laxatives as needed. - Provided hand exercise instructions to improve strength and flexibility. - Advised relative rest for two days, avoiding strenuous activities for one week. - Encouraged continuation of home exercises for management and prevention. - No formal follow-up unless symptoms recur or worsen.

## 2024-03-26 NOTE — Progress Notes (Signed)
 "    Primary Care / Sports Medicine Office Visit  Patient Information:  Patient ID: Melinda Ford, female DOB: 03/21/1946 Age: 78 y.o. MRN: [id number]   Melinda Ford is a pleasant 78 y.o. female presenting with the following:  Chief Complaint  Patient presents with   Procedure    Patient presents today for trigger finger injection for her right ring finger.    Vitals:   03/19/24 1102  BP: 116/60  Pulse: 72  SpO2: 98%   Vitals:   03/19/24 1102  Height: 5' 2 (1.575 m)   Body mass index is 28.53 kg/m.  No results found.   Discussed the use of AI scribe software for clinical note transcription with the patient, who gave verbal consent to proceed.   Independent interpretation of notes and tests performed by another provider:   None  Procedures performed:   Procedure:  Injection of right third trigger finger under ultrasound guidance. Ultrasound guidance utilized for in-plane approach to volar third digit flexor tendon sheath Samsung HS60 device utilized with permanent recording / reporting. Verbal informed consent obtained and verified. Skin prepped in a sterile fashion. Ethyl chloride for topical local analgesia.  Completed without difficulty and tolerated well. Medication: triamcinolone  acetonide 40 mg/mL suspension for injection 0.55 mL total and 0.25 mL lidocaine  1% without epinephrine utilized for needle placement anesthetic Advised to contact for fevers/chills, erythema, induration, drainage, or persistent bleeding.   Pertinent History, Exam, Impression, and Recommendations:   Problem List Items Addressed This Visit     Trigger ring finger of right hand - Primary   History of Present Illness Melinda Ford is a 78 year old female with type 2 diabetes who presents for management of recurrent right third finger trigger finger following corticosteroid injection.  Right third finger pain and triggering - Recurrent pain and soreness localized to the right third  finger following corticosteroid injection - Symptoms returned after a period of improvement post-injection - Pain described as mild to moderate - Increased soreness noted yesterday with partial improvement since then - Able to perform hand exercises including making a fist, spreading the hand, and squeezing - History of significant manual activity with the right hand - Symptoms have recurred after prior improvement - No shoulder symptoms - Able to position the hand for procedures  Analgesic use and side effects - Prescribed oxycodone  2.5 mg for analgesia - Initially takes half a tablet with option to redose after four hours - Mild sedation after oxycodone  use - No dizziness or nausea - Manages opioid-induced constipation with laxatives as needed - Maintains regular bowel movements  Impact of diabetes and aging on hand symptoms - Expresses concern regarding the impact of aging and diabetes on hand symptoms - Aware of association between diabetes and increased inflammation  Physical Exam STRENGTH: Right hand soreness at the third digit upon palpation. Shoulder examination reveals no abnormalities.  Assessment and Plan Trigger finger, right hand Recurrent right hand trigger finger likely due to frequent hand use and diabetes, causing inflammation and symptoms. - Administered corticosteroid injection with local anesthetic. - Recommended ice application post-procedure to reduce pain and inflammation. - Advised acetaminophen  for mild pain; prescribed oxycodone  2.5 mg for severe pain, with redosing after four hours if needed. - Discussed constipation risk with oxycodone ; advised regular bowel movements and laxatives as needed. - Provided hand exercise instructions to improve strength and flexibility. - Advised relative rest for two days, avoiding strenuous activities for one week. - Encouraged continuation of home  exercises for management and prevention. - No formal follow-up unless  symptoms recur or worsen.      Relevant Orders   US  LIMITED JOINT SPACE STRUCTURES UP RIGHT     Orders & Medications Medications:  Meds ordered this encounter  Medications   methylPREDNISolone  acetate (DEPO-MEDROL ) injection 20 mg   Orders Placed This Encounter  Procedures   US  LIMITED JOINT SPACE STRUCTURES UP RIGHT     No follow-ups on file.     Selinda JINNY Ku, MD, [facility]   Primary Care Sports Medicine [facility]   "

## 2024-03-26 NOTE — Patient Instructions (Signed)
 VISIT SUMMARY:  Today, we addressed the recurrent trigger finger in your right hand, which has been causing you pain and soreness. We administered a corticosteroid injection and provided guidance on managing pain and inflammation.  YOUR PLAN:  TRIGGER FINGER, RIGHT HAND: Recurrent pain and triggering in your right third finger, likely due to frequent hand use and diabetes. -Administered corticosteroid injection with local anesthetic. -Apply ice to the area post-procedure to reduce pain and inflammation. -Take acetaminophen  for mild pain and oxycodone  2.5 mg for severe pain, with the option to redose after four hours if needed. -Be aware of the risk of constipation with oxycodone ; use laxatives as needed and maintain regular bowel movements. -Perform hand exercises to improve strength and flexibility. -Rest your hand for two days and avoid strenuous activities for one week. -Continue home exercises to manage and prevent symptoms. -No formal follow-up is needed unless symptoms recur or worsen.

## 2024-04-23 ENCOUNTER — Other Ambulatory Visit: Payer: Self-pay | Admitting: Pharmacist

## 2024-04-23 ENCOUNTER — Telehealth: Payer: Self-pay | Admitting: Pharmacist

## 2024-04-23 NOTE — Progress Notes (Signed)
" ° °  Outreach Note  04/23/2024 Name: Melinda Ford MRN: [id number] DOB: 11-Nov-1945  Referred by: Justus Leita DEL, MD  Was unable to reach patient via telephone today and unable to leave a message as patient's voicemail is not currently setup   Follow Up Plan: Will collaborate with Care Guide to outreach to schedule follow up with me  Sharyle Sia, PharmD, [facility] [phone]    "

## 2024-05-07 ENCOUNTER — Other Ambulatory Visit: Payer: Self-pay | Admitting: Pharmacist

## 2024-05-07 DIAGNOSIS — E118 Type 2 diabetes mellitus with unspecified complications: Secondary | ICD-10-CM

## 2024-05-07 DIAGNOSIS — E1169 Type 2 diabetes mellitus with other specified complication: Secondary | ICD-10-CM

## 2024-05-07 NOTE — Progress Notes (Signed)
 "  05/07/2024 Name: Melinda Ford MRN: [id number] DOB: Feb 22, 1946  Chief Complaint  Patient presents with   Medication Management   Medication Adherence    Melinda Ford is a 79 y.o. year old female who presented for a telephone visit.   They were referred to the pharmacist by a quality report for assistance in managing diabetes.      Subjective:   Care Team: Primary Care Provider: Justus Leita DEL, MD/Dr. Lemon; Next Scheduled Visit: 07/22/2024 Endocrinologist Cherilyn Debby Quivers, MD; Next Scheduled Visit: 05/19/2024 Dietitian: Katheleen Aleene ORN, RD; Next Scheduled Visit: 06/04/2024 Podiatrist: Ashley Eva Dawn, DPM   Medication Access/Adherence  Current Pharmacy:  [facility] [address] [place] - [address] [place] [address] Phone: [phone] Fax: [phone]  [facility] [place], [place] - [address] [place] [address] Phone: [phone] Fax: [phone]  [id number] [place], [place] - [address] AT [facility] [address] [place] [address] [place] [address] Phone: [phone] Fax: [phone]  [facility] - [place], [place] - [address] [place] [address] Phone: [phone] Fax: [phone]  [facility] [address], [place] - [address] [place] [address] Phone: [phone] Fax: [phone]   Patient reports affordability concerns with their medications: No  Patient reports access/transportation concerns to their pharmacy: No  Patient reports adherence concerns with their medications:  Yes     Reports currently takes medications directly from pill bottles. Patient previously using weekly pillbox, but reports has not replaced since misplaced it.    Diabetes:   Patient followed by [facility]   Current medications:  - Ozempic  0.5 mg weekly - note patient  restarted taking in October Reports tolerating current dose, but does not want to increase due to previous side effects  - Lantus  15 units daily - glipizide 5 mg daily ~30 minutes before lunch (biggest meal of the day)   Medications tried in the past: Humalog  with V-go; glipizide; Ozempic  2 mg dose - constipation   Reports using Freestyle Libre 3 Plus. Unable to connect via Libreview (uses reader device) - Patient shares the following data for past 14-days from her reader: % Time CGM is active: 96% Average Glucose: 182 mg/dL Time in Goal:  - Time in range 70-180: 56% - Time above range: 44% - Time below range: 0%  Attributes recent elevated readings to snacking more when she has been at home with cold weather   Denies symptoms of hypoglycemia    Current physical activity: Goes to gym twice weekly   Statin therapy: pravastatin  20 mg three times weekly     Objective:  Lab Results  Component Value Date   HGBA1C 8.3 01/07/2024    Lab Results  Component Value Date   CREATININE 0.69 07/18/2023   BUN 21 07/18/2023   NA 138 07/18/2023   K 4.3 07/18/2023   CL 101 07/18/2023   CO2 23 07/18/2023    Lab Results  Component Value Date   CHOL 199 07/18/2023   HDL 86 07/18/2023   LDLCALC 97 07/18/2023   TRIG 94 07/18/2023   CHOLHDL 2.3 07/18/2023    Medications Reviewed Today     Reviewed by Melinda Ford, Melinda Ford (Pharmacist) on 05/07/24 at 1602  Med List Status: <None>   Medication Order Taking?  Sig Documenting Provider Last Dose Status Informant  acetaminophen  (TYLENOL ) 500 MG tablet [id number]  Take 1,000 mg by mouth 2 (two) times daily. As needed [provider]  Active   amLODipine  (NORVASC ) 10 MG tablet [id number]  Take 1 tablet (10 mg total) by mouth daily. Justus Leita DEL, MD  Active   Apoaequorin (PREVAGEN PO) [id number]  Take by mouth. [provider]  Active   Calcium Carbonate-Vit D-Min (CALCIUM 1200 PO) [id number]  Take 2 each by mouth  daily. [provider]  Active   Cholecalciferol (VITAMIN D ) 2000 UNITS CAPS [id number]  Take 1 capsule (2,000 Units total) by mouth daily. Loria Fallow, MD  Active   clotrimazole -betamethasone  (LOTRISONE ) cream [id number]  Apply 1 Application topically daily. To feet and rash under breasts Justus Leita DEL, MD  Active   Continuous Glucose Sensor (FREESTYLE LIBRE 3 PLUS SENSOR) MISC [id number]  1 each by Does not apply route continuous. Change every 15 days. Thapa, [place], MD  Active   Continuous Glucose Sensor (FREESTYLE LIBRE 3 SENSOR) [place] [id number]  Place 1 sensor on the skin every 14 days. Use to check glucose continuously Thapa, [place], MD  Active   glipiZIDE (GLUCOTROL) 5 MG tablet [id number] Yes Take 5 mg by mouth daily before lunch. [provider]  Active   Glucagon  (GVOKE HYPOPEN  1-PACK) 1 MG/0.2ML SOAJ [id number]  Inject 1 mg into the skin as needed (low blood sugar with impaired consciousness). Thapa, [place], MD  Active            Med Note OCTAVIA CHRISSIE KANDICE [place] Feb 22, 2023  9:35 AM) Has not used this yet   glucose blood [facility] VERIO) test strip [id number]  USE 1 STRIP TO CHECK GLUCOSE 4 TIMES DAILY Justus Leita DEL, MD  Active   Insulin  Pen Needle (PEN NEEDLES) 32G X 5 MM MISC [id number]  Please Von Pacific, MD  Active   LANTUS  SOLOSTAR 100 UNIT/ML Solostar Pen [id number] Yes Inject 15 Units into the skin daily. [provider]  Active   meloxicam  (MOBIC ) 15 MG tablet [id number]  Take 1 tablet (15 mg total) by mouth daily. Alvia Selinda PARAS, MD  Active   Multiple Vitamin (MULTI-VITAMIN) tablet [id number]  Take 1 tablet by mouth. [provider]  Active   NON FORMULARY [id number]  CPAP @@ 9 cm H2O nightly [provider]  Active   oxyCODONE -acetaminophen  (PERCOCET/ROXICET) 5-325 MG tablet [id number]  Take half tablet (2.5 mg) PO 45 minutes prior to planned procedure. Can repeat half tablet (2.5 mg) in 4 hours as-needed. Matthews, Jason J, MD   Active   OZEMPIC , 0.25 OR 0.5 MG/DOSE, 2 MG/3ML SOPN [id number] Yes Inject 0.5 mg into the skin once a week. [provider]  Active   pravastatin  (PRAVACHOL ) 20 MG tablet [id number] Yes Take 20 mg by mouth 3 (three) times a week. [provider]  Active   terbinafine (LAMISIL) 250 MG tablet [id number]  Take 250 mg by mouth daily. [provider]  Active   triamcinolone  cream (KENALOG ) 0.5 % [id number]  Apply 1 Application topically 3 (three) times daily. To rash on neck Justus Leita DEL, MD  Active            Med Note CATHY, PAULA   Thu Aug 30, 2023  2:05 PM) prn              Assessment/Plan:   Discuss importance of medication adherence. Encourage patient to pick up new weekly pillbox and start  using consistently   Diabetes: - Currently uncontrolled based off of latest A1C result - Reviewed importance of having regular well-balanced meals and snacks throughout the day, while controlling carbohydrate portion sizes             Advise patient against skipping meals - Remind patient to take her glipizide dose 30 minutes before the meal, rather than during the meal - Counseled patient on s/s of low blood sugar and how to treat lows  Recommend patient pick up new supply of glucose tablets - Recommend to check glucose, keep log of results and have this record to review at upcoming medical appointments. Patient to contact provider office sooner if needed for readings outside of established parameters or symptoms       Follow Up Plan: Clinical Pharmacist will follow up with patient by telephone on 06/18/2024 at 3:30 PM    Sharyle Sia, PharmD, [facility] [phone]   "

## 2024-05-07 NOTE — Patient Instructions (Signed)
 Goals Addressed             This Visit's Progress    Pharmacy Goals       The goal A1c is less than 7%. This is the best way to reduce the risk of the long term complications of diabetes, including heart disease, kidney disease, eye disease, strokes, and nerve damage. An A1c of less than 7% corresponds with fasting sugars less than 130 and 2 hour after meal sugars less than 180.   Our goal bad cholesterol, or LDL, is less than 70 . This is why it is important to continue taking your pravastatin .  Sharyle Sia, PharmD, [facility] [phone]

## 2024-06-04 ENCOUNTER — Encounter: Admitting: Dietician

## 2024-06-18 ENCOUNTER — Other Ambulatory Visit: Admitting: Pharmacist

## 2024-07-08 ENCOUNTER — Encounter: Admitting: Student

## 2024-07-22 ENCOUNTER — Encounter: Admitting: Student

## 2024-07-23 ENCOUNTER — Encounter: Admitting: Student
